# Patient Record
Sex: Female | Born: 1935 | Race: Black or African American | Hispanic: No | Smoking: Never smoker
Health system: Southern US, Community
[De-identification: ages and names within clinical notes are randomized; demographics above are authoritative.]

## PROBLEM LIST (undated history)

## (undated) DIAGNOSIS — Z515 Encounter for palliative care: Secondary | ICD-10-CM

## (undated) DIAGNOSIS — M109 Gout, unspecified: Secondary | ICD-10-CM

## (undated) DIAGNOSIS — I480 Paroxysmal atrial fibrillation: Secondary | ICD-10-CM

## (undated) DIAGNOSIS — I10 Essential (primary) hypertension: Secondary | ICD-10-CM

## (undated) DIAGNOSIS — I890 Lymphedema, not elsewhere classified: Secondary | ICD-10-CM

## (undated) DIAGNOSIS — R0602 Shortness of breath: Secondary | ICD-10-CM

## (undated) DIAGNOSIS — Z8679 Personal history of other diseases of the circulatory system: Secondary | ICD-10-CM

## (undated) DIAGNOSIS — G8929 Other chronic pain: Secondary | ICD-10-CM

## (undated) DIAGNOSIS — M549 Dorsalgia, unspecified: Secondary | ICD-10-CM

## (undated) DIAGNOSIS — N182 Chronic kidney disease, stage 2 (mild): Secondary | ICD-10-CM

## (undated) DIAGNOSIS — I2699 Other pulmonary embolism without acute cor pulmonale: Secondary | ICD-10-CM

## (undated) DIAGNOSIS — C549 Malignant neoplasm of corpus uteri, unspecified: Secondary | ICD-10-CM

## (undated) DIAGNOSIS — I959 Hypotension, unspecified: Secondary | ICD-10-CM

## (undated) DIAGNOSIS — I5031 Acute diastolic (congestive) heart failure: Secondary | ICD-10-CM

## (undated) DIAGNOSIS — N189 Chronic kidney disease, unspecified: Secondary | ICD-10-CM

## (undated) DIAGNOSIS — Z86010 Personal history of colonic polyps: Secondary | ICD-10-CM

## (undated) DIAGNOSIS — IMO0002 Reserved for concepts with insufficient information to code with codable children: Secondary | ICD-10-CM

## (undated) DIAGNOSIS — Z9889 Other specified postprocedural states: Secondary | ICD-10-CM

## (undated) DIAGNOSIS — K5790 Diverticulosis of intestine, part unspecified, without perforation or abscess without bleeding: Secondary | ICD-10-CM

## (undated) DIAGNOSIS — I219 Acute myocardial infarction, unspecified: Secondary | ICD-10-CM

## (undated) DIAGNOSIS — J309 Allergic rhinitis, unspecified: Secondary | ICD-10-CM

## (undated) DIAGNOSIS — N200 Calculus of kidney: Secondary | ICD-10-CM

## (undated) DIAGNOSIS — M1990 Unspecified osteoarthritis, unspecified site: Secondary | ICD-10-CM

## (undated) DIAGNOSIS — K219 Gastro-esophageal reflux disease without esophagitis: Secondary | ICD-10-CM

## (undated) DIAGNOSIS — C55 Malignant neoplasm of uterus, part unspecified: Secondary | ICD-10-CM

## (undated) DIAGNOSIS — I2510 Atherosclerotic heart disease of native coronary artery without angina pectoris: Secondary | ICD-10-CM

## (undated) DIAGNOSIS — I350 Nonrheumatic aortic (valve) stenosis: Secondary | ICD-10-CM

## (undated) DIAGNOSIS — I471 Supraventricular tachycardia: Secondary | ICD-10-CM

## (undated) DIAGNOSIS — Z95828 Presence of other vascular implants and grafts: Secondary | ICD-10-CM

## (undated) DIAGNOSIS — J189 Pneumonia, unspecified organism: Secondary | ICD-10-CM

## (undated) DIAGNOSIS — J9600 Acute respiratory failure, unspecified whether with hypoxia or hypercapnia: Secondary | ICD-10-CM

## (undated) DIAGNOSIS — I313 Pericardial effusion (noninflammatory): Secondary | ICD-10-CM

## (undated) DIAGNOSIS — J40 Bronchitis, not specified as acute or chronic: Secondary | ICD-10-CM

## (undated) HISTORY — PX: KNEE ARTHROSCOPY: SHX127

## (undated) HISTORY — DX: Presence of other vascular implants and grafts: Z95.828

## (undated) HISTORY — DX: Atherosclerotic heart disease of native coronary artery without angina pectoris: I25.10

## (undated) HISTORY — PX: CATARACT EXTRACTION, BILATERAL: SHX1313

## (undated) HISTORY — DX: Dorsalgia, unspecified: M54.9

## (undated) HISTORY — DX: Essential (primary) hypertension: I10

## (undated) HISTORY — DX: Diverticulosis of intestine, part unspecified, without perforation or abscess without bleeding: K57.90

## (undated) HISTORY — PX: OTHER SURGICAL HISTORY: SHX169

## (undated) HISTORY — DX: Morbid (severe) obesity due to excess calories: E66.01

## (undated) HISTORY — DX: Other pulmonary embolism without acute cor pulmonale: I26.99

## (undated) HISTORY — DX: Personal history of colonic polyps: Z86.010

## (undated) HISTORY — DX: Gout, unspecified: M10.9

## (undated) HISTORY — DX: Other chronic pain: G89.29

## (undated) HISTORY — DX: Allergic rhinitis, unspecified: J30.9

## (undated) HISTORY — DX: Reserved for concepts with insufficient information to code with codable children: IMO0002

## (undated) HISTORY — DX: Chronic kidney disease, unspecified: N18.9

## (undated) HISTORY — DX: Malignant neoplasm of uterus, part unspecified: C55

## (undated) HISTORY — DX: Lymphedema, not elsewhere classified: I89.0

## (undated) HISTORY — PX: COLONOSCOPY: SHX174

## (undated) HISTORY — DX: Gastro-esophageal reflux disease without esophagitis: K21.9

## (undated) HISTORY — DX: Malignant neoplasm of corpus uteri, unspecified: C54.9

## (undated) HISTORY — DX: Calculus of kidney: N20.0

## (undated) HISTORY — DX: Other specified postprocedural states: Z98.890

## (undated) HISTORY — PX: CARDIAC CATHETERIZATION: SHX172

## (undated) HISTORY — DX: Unspecified osteoarthritis, unspecified site: M19.90

## (undated) HISTORY — DX: Chronic kidney disease, stage 2 (mild): N18.2

## (undated) HISTORY — DX: Acute diastolic (congestive) heart failure: I50.31

## (undated) HISTORY — DX: Pneumonia, unspecified organism: J18.9

---

## 1971-10-16 HISTORY — PX: TUBAL LIGATION: SHX77

## 1987-10-16 DIAGNOSIS — I219 Acute myocardial infarction, unspecified: Secondary | ICD-10-CM

## 1987-10-16 HISTORY — DX: Acute myocardial infarction, unspecified: I21.9

## 1998-08-11 ENCOUNTER — Other Ambulatory Visit: Admission: RE | Admit: 1998-08-11 | Discharge: 1998-08-11 | Payer: Self-pay | Admitting: Endocrinology

## 1998-10-13 ENCOUNTER — Encounter: Payer: Self-pay | Admitting: Endocrinology

## 1998-10-13 ENCOUNTER — Ambulatory Visit (HOSPITAL_COMMUNITY): Admission: RE | Admit: 1998-10-13 | Discharge: 1998-10-13 | Payer: Self-pay | Admitting: Endocrinology

## 1998-11-21 ENCOUNTER — Ambulatory Visit (HOSPITAL_COMMUNITY): Admission: RE | Admit: 1998-11-21 | Discharge: 1998-11-21 | Payer: Self-pay | Admitting: *Deleted

## 1999-01-26 ENCOUNTER — Encounter: Payer: Self-pay | Admitting: General Surgery

## 1999-01-26 ENCOUNTER — Ambulatory Visit (HOSPITAL_COMMUNITY): Admission: RE | Admit: 1999-01-26 | Discharge: 1999-01-26 | Payer: Self-pay | Admitting: General Surgery

## 1999-05-26 ENCOUNTER — Ambulatory Visit (HOSPITAL_COMMUNITY): Admission: RE | Admit: 1999-05-26 | Discharge: 1999-05-26 | Payer: Self-pay | Admitting: General Surgery

## 1999-05-26 ENCOUNTER — Encounter: Payer: Self-pay | Admitting: General Surgery

## 1999-10-05 ENCOUNTER — Other Ambulatory Visit: Admission: RE | Admit: 1999-10-05 | Discharge: 1999-10-05 | Payer: Self-pay | Admitting: Endocrinology

## 2000-01-23 ENCOUNTER — Encounter: Payer: Self-pay | Admitting: Endocrinology

## 2000-01-23 ENCOUNTER — Ambulatory Visit (HOSPITAL_COMMUNITY): Admission: RE | Admit: 2000-01-23 | Discharge: 2000-01-23 | Payer: Self-pay | Admitting: Endocrinology

## 2000-07-17 ENCOUNTER — Other Ambulatory Visit: Admission: RE | Admit: 2000-07-17 | Discharge: 2000-07-17 | Payer: Self-pay | Admitting: Endocrinology

## 2001-01-30 ENCOUNTER — Encounter: Payer: Self-pay | Admitting: Endocrinology

## 2001-01-30 ENCOUNTER — Ambulatory Visit (HOSPITAL_COMMUNITY): Admission: RE | Admit: 2001-01-30 | Discharge: 2001-01-30 | Payer: Self-pay | Admitting: Endocrinology

## 2001-03-26 ENCOUNTER — Encounter (INDEPENDENT_AMBULATORY_CARE_PROVIDER_SITE_OTHER): Payer: Self-pay | Admitting: Specialist

## 2001-03-26 ENCOUNTER — Ambulatory Visit (HOSPITAL_COMMUNITY): Admission: RE | Admit: 2001-03-26 | Discharge: 2001-03-26 | Payer: Self-pay | Admitting: *Deleted

## 2001-07-31 ENCOUNTER — Other Ambulatory Visit: Admission: RE | Admit: 2001-07-31 | Discharge: 2001-07-31 | Payer: Self-pay | Admitting: Endocrinology

## 2002-02-02 ENCOUNTER — Encounter: Payer: Self-pay | Admitting: Endocrinology

## 2002-02-02 ENCOUNTER — Ambulatory Visit (HOSPITAL_COMMUNITY): Admission: RE | Admit: 2002-02-02 | Discharge: 2002-02-02 | Payer: Self-pay | Admitting: Endocrinology

## 2003-02-04 ENCOUNTER — Encounter: Payer: Self-pay | Admitting: Endocrinology

## 2003-02-04 ENCOUNTER — Encounter: Admission: RE | Admit: 2003-02-04 | Discharge: 2003-02-04 | Payer: Self-pay | Admitting: Endocrinology

## 2003-06-17 ENCOUNTER — Encounter: Payer: Self-pay | Admitting: *Deleted

## 2003-06-17 ENCOUNTER — Encounter: Admission: RE | Admit: 2003-06-17 | Discharge: 2003-06-17 | Payer: Self-pay | Admitting: *Deleted

## 2004-02-09 ENCOUNTER — Encounter: Admission: RE | Admit: 2004-02-09 | Discharge: 2004-02-09 | Payer: Self-pay | Admitting: Endocrinology

## 2005-02-13 ENCOUNTER — Encounter: Admission: RE | Admit: 2005-02-13 | Discharge: 2005-02-13 | Payer: Self-pay | Admitting: Endocrinology

## 2006-02-05 ENCOUNTER — Ambulatory Visit (HOSPITAL_COMMUNITY): Admission: RE | Admit: 2006-02-05 | Discharge: 2006-02-05 | Payer: Self-pay | Admitting: *Deleted

## 2006-02-19 ENCOUNTER — Encounter: Admission: RE | Admit: 2006-02-19 | Discharge: 2006-02-19 | Payer: Self-pay | Admitting: General Surgery

## 2007-03-12 ENCOUNTER — Encounter: Admission: RE | Admit: 2007-03-12 | Discharge: 2007-03-12 | Payer: Self-pay | Admitting: Internal Medicine

## 2007-07-01 ENCOUNTER — Encounter: Admission: RE | Admit: 2007-07-01 | Discharge: 2007-07-01 | Payer: Self-pay | Admitting: Internal Medicine

## 2008-03-15 ENCOUNTER — Encounter: Admission: RE | Admit: 2008-03-15 | Discharge: 2008-03-15 | Payer: Self-pay | Admitting: Internal Medicine

## 2008-03-30 ENCOUNTER — Ambulatory Visit (HOSPITAL_COMMUNITY): Admission: RE | Admit: 2008-03-30 | Discharge: 2008-03-30 | Payer: Self-pay | Admitting: *Deleted

## 2008-09-06 ENCOUNTER — Ambulatory Visit (HOSPITAL_COMMUNITY): Admission: RE | Admit: 2008-09-06 | Discharge: 2008-09-06 | Payer: Self-pay | Admitting: Obstetrics

## 2008-09-14 ENCOUNTER — Ambulatory Visit: Admission: RE | Admit: 2008-09-14 | Discharge: 2008-09-14 | Payer: Self-pay | Admitting: Gynecologic Oncology

## 2008-10-05 ENCOUNTER — Encounter: Payer: Self-pay | Admitting: Obstetrics & Gynecology

## 2008-10-05 ENCOUNTER — Inpatient Hospital Stay (HOSPITAL_COMMUNITY): Admission: RE | Admit: 2008-10-05 | Discharge: 2008-10-11 | Payer: Self-pay | Admitting: Obstetrics and Gynecology

## 2008-10-05 HISTORY — PX: ABDOMINAL HYSTERECTOMY: SHX81

## 2008-10-19 ENCOUNTER — Ambulatory Visit: Admission: RE | Admit: 2008-10-19 | Discharge: 2008-10-19 | Payer: Self-pay | Admitting: Gynecologic Oncology

## 2008-11-23 ENCOUNTER — Ambulatory Visit: Admission: RE | Admit: 2008-11-23 | Discharge: 2008-11-23 | Payer: Self-pay | Admitting: Gynecologic Oncology

## 2008-11-23 ENCOUNTER — Ambulatory Visit: Admission: RE | Admit: 2008-11-23 | Discharge: 2009-02-13 | Payer: Self-pay | Admitting: Radiation Oncology

## 2009-01-27 LAB — URINALYSIS, MICROSCOPIC - CHCC
Bilirubin (Urine): NEGATIVE
Glucose: NEGATIVE g/dL

## 2009-02-03 DIAGNOSIS — IMO0001 Reserved for inherently not codable concepts without codable children: Secondary | ICD-10-CM

## 2009-02-03 HISTORY — DX: Reserved for inherently not codable concepts without codable children: IMO0001

## 2009-04-04 ENCOUNTER — Encounter: Admission: RE | Admit: 2009-04-04 | Discharge: 2009-04-04 | Payer: Self-pay | Admitting: Internal Medicine

## 2009-06-15 ENCOUNTER — Ambulatory Visit: Admission: RE | Admit: 2009-06-15 | Discharge: 2009-06-15 | Payer: Self-pay | Admitting: Gynecologic Oncology

## 2009-06-15 ENCOUNTER — Other Ambulatory Visit: Admission: RE | Admit: 2009-06-15 | Discharge: 2009-06-15 | Payer: Self-pay | Admitting: Gynecologic Oncology

## 2009-06-15 ENCOUNTER — Encounter: Payer: Self-pay | Admitting: Gynecologic Oncology

## 2009-08-23 ENCOUNTER — Ambulatory Visit: Admission: RE | Admit: 2009-08-23 | Discharge: 2009-08-23 | Payer: Self-pay | Admitting: Radiation Oncology

## 2009-08-23 ENCOUNTER — Other Ambulatory Visit: Admission: RE | Admit: 2009-08-23 | Discharge: 2009-08-23 | Payer: Self-pay | Admitting: Radiation Oncology

## 2009-08-23 ENCOUNTER — Encounter: Payer: Self-pay | Admitting: Radiation Oncology

## 2009-11-16 ENCOUNTER — Other Ambulatory Visit: Admission: RE | Admit: 2009-11-16 | Discharge: 2009-11-16 | Payer: Self-pay | Admitting: Gynecologic Oncology

## 2009-11-16 ENCOUNTER — Ambulatory Visit: Admission: RE | Admit: 2009-11-16 | Discharge: 2009-11-16 | Payer: Self-pay | Admitting: Gynecologic Oncology

## 2010-02-21 ENCOUNTER — Other Ambulatory Visit: Admission: RE | Admit: 2010-02-21 | Discharge: 2010-02-21 | Payer: Self-pay | Admitting: Radiation Oncology

## 2010-02-21 ENCOUNTER — Ambulatory Visit: Admission: RE | Admit: 2010-02-21 | Discharge: 2010-02-21 | Payer: Self-pay | Admitting: Radiation Oncology

## 2010-03-28 ENCOUNTER — Ambulatory Visit: Admission: RE | Admit: 2010-03-28 | Discharge: 2010-03-28 | Payer: Self-pay | Admitting: Gynecologic Oncology

## 2010-06-01 ENCOUNTER — Other Ambulatory Visit: Admission: RE | Admit: 2010-06-01 | Discharge: 2010-06-01 | Payer: Self-pay | Admitting: Gynecologic Oncology

## 2010-06-01 ENCOUNTER — Ambulatory Visit: Admission: RE | Admit: 2010-06-01 | Discharge: 2010-06-01 | Payer: Self-pay | Admitting: Gynecologic Oncology

## 2010-08-07 ENCOUNTER — Encounter: Admission: RE | Admit: 2010-08-07 | Discharge: 2010-08-07 | Payer: Self-pay | Admitting: Internal Medicine

## 2010-08-31 ENCOUNTER — Ambulatory Visit: Admission: RE | Admit: 2010-08-31 | Discharge: 2010-08-31 | Payer: Self-pay | Admitting: Radiation Oncology

## 2010-08-31 ENCOUNTER — Other Ambulatory Visit: Admission: RE | Admit: 2010-08-31 | Discharge: 2010-08-31 | Payer: Self-pay | Admitting: Radiation Oncology

## 2010-12-13 ENCOUNTER — Ambulatory Visit: Payer: Medicare Other | Attending: Gynecologic Oncology | Admitting: Gynecologic Oncology

## 2010-12-13 ENCOUNTER — Other Ambulatory Visit (HOSPITAL_COMMUNITY)
Admission: RE | Admit: 2010-12-13 | Discharge: 2010-12-13 | Disposition: A | Discharge status: Home / Self Care | Payer: Medicare Other | Source: Ambulatory Visit | Attending: Gynecologic Oncology | Admitting: Gynecologic Oncology

## 2010-12-13 ENCOUNTER — Other Ambulatory Visit: Payer: Self-pay | Admitting: Gynecologic Oncology

## 2010-12-13 DIAGNOSIS — Z923 Personal history of irradiation: Secondary | ICD-10-CM | POA: Insufficient documentation

## 2010-12-13 DIAGNOSIS — C55 Malignant neoplasm of uterus, part unspecified: Secondary | ICD-10-CM | POA: Insufficient documentation

## 2010-12-13 DIAGNOSIS — Z90710 Acquired absence of both cervix and uterus: Secondary | ICD-10-CM | POA: Insufficient documentation

## 2010-12-13 DIAGNOSIS — Q820 Hereditary lymphedema: Secondary | ICD-10-CM | POA: Insufficient documentation

## 2010-12-13 DIAGNOSIS — Z8540 Personal history of malignant neoplasm of unspecified female genital organ: Secondary | ICD-10-CM | POA: Insufficient documentation

## 2011-01-02 ENCOUNTER — Other Ambulatory Visit: Payer: Self-pay | Admitting: Internal Medicine

## 2011-01-02 ENCOUNTER — Ambulatory Visit
Admission: RE | Admit: 2011-01-02 | Discharge: 2011-01-02 | Disposition: A | Discharge status: Home / Self Care | Payer: Medicare Other | Source: Ambulatory Visit | Attending: Internal Medicine | Admitting: Internal Medicine

## 2011-01-02 DIAGNOSIS — M79659 Pain in unspecified thigh: Secondary | ICD-10-CM

## 2011-01-02 DIAGNOSIS — M25559 Pain in unspecified hip: Secondary | ICD-10-CM

## 2011-01-05 NOTE — Consult Note (Signed)
NAMEMarland Kitchen  Christine [name], Christine [name] NO.:  [id number]  MEDICAL RECORD NO.:  [id number]           PATIENT TYPE:  LOCATION:                                 FACILITY:  PHYSICIAN:  Margy Sumler A. Duard Brady, MD    DATE OF BIRTH:  [birth year]  DATE OF CONSULTATION:  12/13/2010 DATE OF DISCHARGE:                                CONSULTATION   Christine [name] is a very pleasant 77 year old who underwent a TAH-BSO in December 2009 for endometrial carcinoma.  The patient's surgery was complicated and difficult due to her morbid obesity.  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 There was 0.1 out of 2.4 cm depth of stromal invasion with lymphovascular space involvement.  The adnexa were negative.  After lengthy discussions regarding chemotherapy and radiation, they opted for radiation.  She underwent postoperative external beam radiation and vaginal cuff brachytherapy under the care of Dr. Roselind Messier.  She was last seen by Dr. Roselind Messier in November 2011, at which time her exam was unremarkable.  She was seen by Dr. Nelly Rout in August 2011, at which time her exam was negative but the Pap smear revealed low-grade intraepithelial lesions.  There glandular cells without significant atypia and it was opted to follow her closely.  She is overall doing quite well and comes in today for followup.  REVIEW OF SYSTEMS:  She is complaining of some right gluteal/butt pain, which is new.  It has been going on for about a week.  It does radiate down her right leg and down the back of her knee.  She denies any injury or knowledge of pulling a muscle or any trauma.  This is a new pain for her.  She denies any vaginal bleeding; any change in bowel or bladder habits; any nausea, vomiting, fever, chills, headaches, visual changes. She has gained about 10 pounds since her last visit, but she states that she  believes some of this is fluid in her legs.  She has noticed some increase in her swelling from her congenital lymphedema.  Her daughter states that hers appears to be somewhat diet related and if she gave up sodas, the swelling goes away.  The patient states she may have to start doing that as it is really interfering with her quality of life.  She does get short of breath from walking.  She is able to walk from her car to church, but is short of breath and needs to rest, but she states when she rests for a few minutes, she is able to proceed on and it does not limit her quality of life.  She denies any chest pain, headaches, visual changes, shortness of breath, cough.  Medication list is reviewed and is unchanged.  PHYSICAL EXAMINATION:  VITAL SIGNS:  Weight 356 pounds and height 5 feet 7 inches for a BMI greater than 54.  Blood pressure 138/70, pulse 84, respirations 20, temperature 97.9. GENERAL:  A well-nourished, well-developed female, in no acute distress. NECK:  Supple.  There is no lymphadenopathy, no thyromegaly. LUNGS:  Clear to auscultation bilaterally. CARDIOVASCULAR:  Regular rate and rhythm with a 2/6 systolic ejection murmur. ABDOMEN:  Morbidly obese.  She has a well-healed vertical midline incision.  She has changes consistent with candida under the pannus. Groins are negative for adenopathy. EXTREMITIES:  There is massive edema, equal bilaterally, consistent with congenital lymphedema.  There are scales and chronic changes of her legs. PELVIC:  External genitalia is within normal limits.  The vagina is markedly atrophic and foreshortened.  There are no gross visible lesions.  A ThinPrep Pap was done without difficulty.  Bimanual examination is limited by the patient's habitus, but there are no obvious masses.  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  She is doing well from a post-treatment standpoint, being 2  years out from her diagnosis.  She has no evidence of recurrent disease.  PLAN: 1. We will follow up on the results of her Pap smear from today.  She     will follow up with Dr. Roselind Messier in 3 months and return to see [place] in     6 months. 2. She was encouraged to take ibuprofen, use some moist heat or     Tylenol for her back pain.  If this does not improve, she will     discuss it with her primary care physician.     Penelope Fittro A. Duard Brady, MD     PAG/MEDQ  D:  12/13/2010  T:  12/14/2010  Job:  [id number]  cc:   Merlene Laughter. Renae Gloss, M.D. Fax: [phone]  Billie Lade, M.D. Fax: [phone]  Lendon Colonel, MD Fax: [phone]  Telford Nab, R.N. [phone] [address] [place], [place] [address]  Electronically Signed by Cleda Mccreedy MD on 12/14/2010 02:11:30 PM

## 2011-02-27 NOTE — Op Note (Signed)
NAMEMarland [name]  [name], SAND NO.:  [id number]   MEDICAL RECORD NO.:  [id number]          PATIENT TYPE:  INP   LOCATION:  1539                         FACILITY:  [facility]   PHYSICIAN:  Paola A. Duard Brady, MD    DATE OF BIRTH:  11-21-1930   DATE OF PROCEDURE:  10/05/2008  DATE OF DISCHARGE:                               OPERATIVE REPORT   PREOPERATIVE DIAGNOSES:  Endometrial carcinoma, possible carcinosarcoma,  morbid obesity.   POSTOPERATIVE DIAGNOSES:  Endometrial carcinoma, possible  carcinosarcoma, morbid obesity.   PROCEDURE:  Total abdominal hysterectomy, bilateral salpingo-  oophorectomy, bilateral pelvic lymph node sampling, wound VAC placement.   SURGEONS:  Paola A. Duard Brady, MD and Roseanna Rainbow, M.D.   ASSISTANT:  Telford Nab, R.N.   ANESTHESIA:  General.   ANESTHESIOLOGIST:  Jenelle Mages. Fortune, M.D.   ESTIMATED BLOOD LOSS:  200 mL.   IV FLUIDS:  2000 mL.   URINE OUTPUT:  100 mL.   COMPLICATIONS:  None.   SPECIMENS:  Uterus, cervix, bilateral tubes and ovaries, abdominal  ascites, bilateral pelvic lymph nodes.   OPERATIVE FINDINGS:  Included about 500 mL of clear yellow ascites upon  entry to the abdomen.  The corpus itself was of normal size and shape.  There was no obvious extrauterine disease.  There was significant  intraabdominal fat.  There was no palpably enlarged lymph nodes.  All  peritoneal surfaces were smooth.   The patient was taken to the operating room, placed in the supine  position where general anesthesia was induced.  SCDs could not be placed  therefore, foot pumps were placed on the patient.  She was then placed  in dorsal lithotomy position with all appropriate precautions.  The  abdomen was prepped in the usual sterile fashion.  Foley catheter was  inserted in the bladder under sterile conditions.  The patient was then  draped in the usual fashion.  A time-out was performed to confirm the  patient, antibiotics,  allergies, procedure and surgeons.  A vertical  supra/infraumbilical incision was made with a knife and carried down to  the overlying fascia using Bovie cautery.  The fascia was scored in the  midline and extended superiorly and inferiorly.  The rectus bellies were  dissected off the overlying fascia.  The peritoneal cavity was entered  and the peritoneal incision was extended superiorly and inferiorly with  visualization of the underlying peritoneal cavity.  At this time the 500  mL of ascites were removed..  The Bookwalter self-retaining retractor  was then placed on the bed.  The large blades were placed.  The small  bowel was packed out of the all the way with moist laparotomy sponges  using appropriate precautions.  A bladder blade was placed to hold the  subcu fat.  Our attention was first turned to the patient's right  sidewall.  The round ligament was transected with monopolar cautery and  the anterior and posterior leaves of broad ligament were opened.  The  ureter was identified.  A window was made between the IP and the ureter.  The IP was  clamped, transected and suture ligated.  We then skeletonized  the uterine vessels on the patient's right side and created a bladder  flap to the level below the uterine vessels.  Using curved Mastersons  the uterine vessels were clamped.  They were then transected and suture  ligated x2.  A similar procedure was performed on the patient's left  side.  We then proceeded with the bladder flap, dropping it down to a  level below the cervical vaginal junction.  We continued down the  cardinal ligaments with curved Masterson clamps.  Each pedicle was  created, transected and suture ligated with zero Vicryl.  We then came  across the cervicovaginal junction with curved Mastersons and met in the  middle.  The cervix was amputated from the vagina.  Figure-of-eight  sutures of zero Vicryl were placed for the angles.  The remainder of the  vaginal  cuff was closed using zero Vicryl.  The pedicles were noted to  be hemostatic.   Our attention was first drawn to the patient's right sidewall.  With  palpation we were able to identify the external iliac artery.  The nodal  bundle extending over the external iliac artery to the vein was removed.  We did not perform an obturator dissection.  This bundle was removed  using sharp dissection and pinpoint cautery.  A similar dissection was  performed on the patient's right side.  The pedicle sites were all noted  to be hemostatic.  The abdomen and pelvis were copiously irrigated.  Again, all pedicles were inspected and noted to be hemostatic.  The  retractor blades were removed.  All laparotomy sponges were removed.  The count was correct x2.  The fascia was then closed using a running #1  PDS mass closure.  The subcu tissues were irrigated.  Staples were then  placed due to the patient's significant intraabdominal and subcu fat.  The wound VAC was then placed.  The wound VAC sponge was cut to the  length of the patient's incision.  The wound VAC was then applied in the  usual fashion with the two layers of Saran covering.  The wound VAC was  applied with 125 mmHg pressure.  There was a good seal.   The patient was taken to the recovery room in stable condition.  All  instrument, needle and laparotomy counts were correct x2.      Paola A. Duard Brady, MD  Electronically Signed     PAG/MEDQ  D:  10/05/2008  T:  10/05/2008  Job:  [id number]   cc:   Roseanna Rainbow, M.D.  Fax: [phone]   Lendon Colonel, MD  Fax: [phone]   Nanetta Batty, M.D.  Fax: [phone]   Merlene Laughter. Renae Gloss, M.D.  Fax: [phone]   Telford Nab, R.N.  [address]  [place], [place] [address]

## 2011-02-27 NOTE — Consult Note (Signed)
NAMEMarland Kitchen  JALEEYA, Christine [name] NO.:  [id number]   MEDICAL RECORD NO.:  [id number]          PATIENT TYPE:  OUT   LOCATION:  GYN                          FACILITY:  [facility]   PHYSICIAN:  Paola A. Duard Brady, MD    DATE OF BIRTH:  [birth year]   DATE OF CONSULTATION:  10/19/2008  DATE OF DISCHARGE:                                 CONSULTATION   Christine [name] is a very pleasant 77 year old with high-grade endometrial  adenocarcinoma.  She was seen by Dr. Ronita Hipps September 14, 2008.  She  did have a CT scan of the abdomen and pelvis preoperatively that  revealed no evidence of metastatic disease, no adenopathy, no ascites,  or carcinomatosis.  She subsequently underwent TAH/BSO in an attempt at  pelvic lymph node dissection on October 05, 2008.  Surgery was very  complicated and difficult due to the patient's morbid obesity.  We did  attempt to perform nodal excision, however, it was very limited due to  the patient's habitus.  She did fairly well postoperatively, had a wound  VAC placed over the staple line to try to prevent postoperative wound  infection, seroma, and breakdown and comes in today for staple removal.  She is overall doing fairly well, has had some issues with constipation  but is otherwise fine.  Pathology washings were negative within the  uterus.  She had superficial, invasive, high-grade carcinosarcoma  confined within the inner half of the myometrium but involving the  endocervical stroma.  There was 0.1 cm out of 2.4 cm.  There was  cervical stromal involvement.  There was angiolymphatic invasion.  The  adnexa were negative.  We were not able to obtain any lymphoid tissue.  She only had benign fibroadenoma tissue.  From a postoperative  standpoint as stated above, she is doing fairly well and she comes in  for staple removal.  Abdomen, she has a vertical midline incision.  Staples were removed.  There is some maceration of the skin around the  umbilicus.  All  staples removed superior and inferior to the umbilicus.  Steri-Strips were placed.  A moist gauze was placed in the umbilicus  with a dry gauze on top.  A 77 year old at technically unstage though  clinical stage 2 carcinosarcoma of the uterus.  I had a discussion with  the patient, 2 of her daughters, and her son.  There is some debate in  literature whether or not patients with early stage carcinosarcoma  benefit from chemotherapy versus radiation.  There is a GOG protocol  open evaluating this that patients need to be completely staged which  due to her medical comorbidities she was not able to be completely  surgically staged.  I believe due to her comorbidities it would be very  difficult for her to proceed with chemotherapy and her family is very  much in agreement with this.  They would not accept chemotherapy but  would be interested in proceeding with radiation therapy.  I discussed  with them briefly what the radiation therapy would involve.  Their  questions were elicited.  At  this point, they are not ready to make a  definitive decision.  I discussed  with them that it is too soon to start anyway and for them to take a  couple weeks to think about the radiation versus close followup and to  let us know how they would like to proceed.  They have my card, as well  as that of Telford Nab and will contact us with their decision.  We  will schedule her for a postoperative appointment.      Paola A. Duard Brady, MD  Electronically Signed     PAG/MEDQ  D:  10/19/2008  T:  10/19/2008  Job:  [id number]   cc:   Lendon Colonel, MD  Fax: [phone]   Nanetta Batty, M.D.  Fax: [phone]   Merlene Laughter. Renae Gloss, M.D.  Fax: [phone]   Telford Nab, R.N.  [address]  [place], [place] [address]

## 2011-02-27 NOTE — Consult Note (Signed)
NAMEMarland [name]  QUINCY, PRISCO NO.:  [id number]   MEDICAL RECORD NO.:  [id number]          PATIENT TYPE:  OUT   LOCATION:  GYN                          [facility]ACILITY[facility]   PHYSICIAN:  Paola A. Duard Brady, MD    DATE OF BIRTH:  05/21/1931   DATE OF CONSULTATION:  11/23/2008  DATE OF DISCHARGE:                                 CONSULTATION   Ms. Dormer is a 77 year old morbidly obese female with multiple medical  comorbidities with a high-grade endometrial carcinoma.  She was seen by  Dr. Kyla Balzarine in December of 2009.  CT scan of the abdomen and pelvis that  was performed preoperatively revealed no evidence of metastatic disease.  She subsequently underwent a TAH, BSO, and attempt at pelvic lymph node  dissection, October 05, 2008.  Surgery was complicated and difficult  due to the patient's morbid obesity.  We did attempt to perform a nodal  dissection, however, it was very limited to her habitus and final  pathology revealed no evidence of nodal tissue.  However, pathology of  washings were negative.  She had a superficial, invasive, high-grade  carcinosarcoma confined to the inner half of the myometrium but  involving the endocervical stroma.  There was 0.1 cm out of 2.4 cm of  depth of invasion.  There was cervical stromal involvement with LVSI.  The adnexa were negative.  I had a lengthy discussion with the patient,  her 2 daughters, and son on October 19, 2008.  At that time, all agreed  that she would not be a good candidate for postoperative chemotherapy  and they decided to consider the possibility of proceeding with  radiation therapy.  They were undecided and unable to make a decision at  that time so she comes in today for her postoperative check, as well as  to make a decision regarding adjuvant therapy.  The patient and her  daughter state that they were ready to make a decision regarding  radiation at that time and that they would like to do it.  Her son was  trying to  make decisions for her and he was the one that in their  opinion was more vocal regarding not pursuing it.  She currently has an  appointment with Dr. Roselind Messier tomorrow for discussion of radiation  oncology and they would very much like to keep that.   She is overall doing quite well.  She is currently home and has been  home for the past 3 weeks from the nursing facility where she was.  She  has lost approximately 30 pounds since we initially saw her September 14, 2008.  They relate that to the quality of the food at the nursing home  and that she was not eating well.  Now that she is back at home she is  eating more regularly and they feel that the weight loss has changed.  She does complain of some occasional gagging with no emesis.  She is  having regular bowel movements, is tolerating a regular diet.  Denies  any abdominal pain, vaginal bleeding.  She  states she is overall doing  quite well and she and her daughters are very pleased with her current  progress.   PAST MEDICAL HISTORY:  1. Hypertension.  2. Obesity.  3. Diet-controlled diabetes.  4. GERD.  5. Arthritis.  6. Chronic familial lymphedema.   MEDICATIONS:  1. Lotrel.  2. Reglan.  3. Sulindac.  4. Clonidine.  5. Lopressor.  6. Zetia.  7. Diovan.   ALLERGIES:  NONE.   PHYSICAL EXAMINATION:  Weight 324 pounds which is down from 356 pounds,  September 14, 2008.  Well-nourished, well-developed female in no acute  distress.  ABDOMEN:  Incision is evaluated, it is well healed.  There are some  postoperative changes along the incisional tissue.  Abdomen is otherwise  soft and nontender.  There are no masses or hernias appreciated but exam  is significant limited by habitus.  PELVIC:  External genitalia is within normal limits.  The vagina is  slightly atrophic.  The vaginal cuff is visualized with some difficulty  due to the patient's habitus but appears to be well healed.  Bimanual  examination reveals no masses or  nodularity but again exam is limited.   ASSESSMENT:  95. A 77 year old with technically end-stage but at least a stage II      carcinosarcoma of the endometrium who is not deemed to be an      adequate candidate for postoperative chemotherapy.  She has been      dispositioned to postoperative radiation therapy.  I would favor if      at all possible to proceed with pelvic and HTR brachytherapy but I      will need to defer to Dr. Trina Ao opinion.  She has an appointment      with him tomorrow.  After she completes her radiation therapy, we      will begin alternating visits between himself and our office.  2. She did have an episode of the gagging while she was here.  There      was no emesis associated with that.  She does have a history of      gastroesophageal reflux disease and we will recommend that she      supplement with some Tums.      Paola A. Duard Brady, MD  Electronically Signed     PAG/MEDQ  D:  11/23/2008  T:  11/23/2008  Job:  [id number]   cc:   Telford Nab, R.N.  [address]  [place], [place] [address]   Billie Lade, M.D.  Fax: [phone]   Lendon Colonel, MD  Fax: [phone]   Nanetta Batty, M.D.  Fax: [phone]   Merlene Laughter. Renae Gloss, M.D.  Fax: [phone]

## 2011-02-27 NOTE — Discharge Summary (Signed)
NAMEMarland Kitchen  Christine [name], Christine [name] NO.:  [id number]   MEDICAL RECORD NO.:  [id number]          PATIENT TYPE:  INP   LOCATION:  1539                         [facility]ACILITY[facility]   PHYSICIAN:  Roseanna Rainbow, M.D.DATE OF BIRTH:  October 23, 1930   DATE OF ADMISSION:  10/05/2008  DATE OF DISCHARGE:  10/11/2008                               DISCHARGE SUMMARY   CHIEF COMPLAINT:  The patient is a 77 year old with a newly diagnosed  high-grade endometrial adenocarcinoma who presents for operative  management.  Please see the dictated history and physical as per Dr.  Sabino Donovan for further details.   HOSPITAL COURSE:  The patient was admitted and underwent a total  abdominal hysterectomy, bilateral salpingo-oophorectomy, bilateral  pelvic lymph node sampling and wound vac placement.  Please see the  dictated operative summary.  Postoperatively, the patient did well.  Her  diet was gradually advanced.  She remained euglycemic.  Per the family's  request, plans were set in place for transfer to an occupational therapy  facility at the time of discharge.  The wound vac had minimal drainage.  The incision was healing well.   DISCHARGE DIAGNOSIS:  Endometrial carcinoma.   PROCEDURES:  Total abdominal hysterectomy bilateral salpingo-  oophorectomy, bilateral pelvic lymph node sampling and wound vac  placement.   CONDITION:  Stable.   DISCHARGE INSTRUCTIONS:  Diet.  Continue the ADA diet.   MEDICATIONS:  Resume the preoperative medications.  Would continue  prophylactic heparin at 5000 units subcu t.i.d. for approximately a week  to 10 days postop.   DISPOSITION:  The patient is to be transferred to an occupational  therapy facility.  She is also to follow up in the GYN Oncology office.  Continue the wound vac for approximately 1 week.  Remove staples on  postoperative day #7.      Roseanna Rainbow, M.D.  Electronically Signed     LAJ/MEDQ  D:  10/11/2008  T:   10/11/2008  Job:  [id number]   cc:   Nanetta Batty, M.D.  Fax: [phone]   Lendon Colonel, MD  Fax: [phone]   Merlene Laughter. Renae Gloss, M.D.  Fax: [phone]   Telford Nab, R.N.  [address]  [place], [place] [address]

## 2011-02-27 NOTE — Consult Note (Signed)
NAMEMarland [name]  SHANNA, UN NO.:  [id number]   MEDICAL RECORD NO.:  [id number]          PATIENT TYPE:  OUT   LOCATION:  GYN                          FACILITY:  [facility]   PHYSICIAN:  Paola A. Duard Brady, MD    DATE OF BIRTH:  [birth year]   DATE OF CONSULTATION:  06/15/2009  DATE OF DISCHARGE:                                 CONSULTATION   HISTORY OF PRESENT ILLNESS:  Christine [name] is a very pleasant 77 year old  who was diagnosed with an endometrial carcinoma.  She subsequently  underwent exploratory laparotomy TAH-BSO and attempt at lymph node  dissection October 05, 2008.  Surgery was complicated and difficult due  to the patient's morbid obesity.  Final pathology revealed a superficial  invasive high-grade carcinosarcoma confined to the inner half of the  myometrium but involving the endocervical stroma.  There was 0.1 out of  2.4 cm depth of invasion and stromal invasion with lymphovascular space  involvement.  The adnexa were negative.  After lengthy discussions  regarding chemotherapy versus radiation therapy we opted for radiation  therapy.  She underwent postoperative external beam radiation with  vaginal cuff brachytherapy under the care of Dr. Roselind Messier.  She saw Dr.  Roselind Messier for 6 weeks post radiation visit March 15, 2009, and comes in today  for follow-up.  She is overall doing quite well.  She comes accompanied  by two of her daughters.  We did spend a significant amount of time  talking about weight loss strategies, food preferences.  The patient  does seem to have a fairly high fat diet.  She eats biscuits in the  morning.  She has a desert every night, drinks at least two high calorie  sodas during the day.  She is very sedentary due to her weight and  mobility limitations.  We spent a significant amount of time talking  about drinking water over non-diet sodas, but if she feels that she  needs a soda, it would be better to drink a diet soda.  We also talked  about food  portions in that each portion of food she eats should be  approximately the size of the palm of her hand.  It is clear that the  patient has a very high fat diet.  She would be interested in speaking  with a nutritionist here at the [facility], but will try to modify  some of these food choices.  Her daughters who are here with her today  are very supportive of this.  She has overall done fairly well.  She  states that she has had no issues and had no issues with the radiation  therapy and tolerated it quite well.   REVIEW OF SYSTEMS:  She denies any cramping.  She denies any bleeding,  any nausea, vomiting, fevers, chills, headaches, visual changes, change  in bowel or bladder habits.  She is very sedentary.  Denies any pain.  She does have shortness of breath when she walks around with her walker.  She comes in today in a wheelchair.  They are having issues keeping her  toenails clipped and she has not been back to see podiatrist.   PHYSICAL EXAMINATION:  VITAL SIGNS:  Weight exceeded 350 pounds, could  not be weighed on our scale.  Her last weight in February was 324  pounds.  Blood pressure 134/72.  GENERAL:  Well-nourished, well-developed pleasant female with very poor  dentition in no acute distress.  NECK:  Supple with no lymphadenopathy, no thyromegaly.  LUNGS:  Clear to auscultation bilaterally.  CARDIOVASCULAR:  Irregular rate and rhythm.  ABDOMEN:  Morbidly obese.  She has a well-healed surgical incision.  There is no evidence of any incisional hernias.  Abdomen is soft,  nontender.  I Cannot appreciate any masses or hepatosplenomegaly.  Her  exam is limited by habitus.  She has clean intertriginous zones with no  evidence of any candida.  She does have chronic skin changes underneath  her pannus.  Groins are negative for adenopathy.  EXTREMITIES:  She has massive familial lymphedema with chronic skin  changes.  She has a very long second toe nail that is curving and   potentially adding pressure to the big toe on her left foot.  She has a  well-healing sore on the dorsum of her right foot.  PELVIC:  External genitalia is within normal limits.  Vagina is  atrophic.  The vaginal cuff is visualized.  No visible lesions.  ThinPrep Pap smear without difficulty.  Bimanual examination is limited  by habitus.  However, the top of the vagina is palpably normal.  There  is no rectovaginal nodularity but exam is severely limited by habitus.   ASSESSMENT:  A 77 year old with stage II carcinosarcoma who clinically  has no evidence of recurrent disease.   PLAN:  1. Will follow up on results of her Pap smear from today.  She did      have a CT scan of the chest, abdomen and pelvis in November 2009.      When she returns to see [place] in January 2011, will schedule her for      repeat imaging.  She has an appointment with Dr. Roselind Messier in 2-3      months and will return to see [place] in 3 months after that.  2. She was given the phone number for [facility] to call to make an appointment with them should she      desire.  We had set a 20 pounds weight loss goal for her next visit      with [place].      Paola A. Duard Brady, MD  Electronically Signed     PAG/MEDQ  D:  06/15/2009  T:  06/15/2009  Job:  [id number]   cc:   Telford Nab, R.N.  [address]  [place], [place] [address]   Merlene Laughter. Renae Gloss, M.D.  Fax: [phone]   Lendon Colonel, MD  Fax: [phone]   Billie Lade, M.D.  Fax: [phone]

## 2011-02-27 NOTE — Consult Note (Signed)
NAMEMarland Kitchen  Christine, [name] NO.:  [id number]   MEDICAL RECORD NO.:  [id number]          PATIENT TYPE:  OUT   LOCATION:                               FACILITY:  [facility]   PHYSICIAN:  John T. Kyla Balzarine, M.D.    DATE OF BIRTH:  02-19-1931   DATE OF CONSULTATION:  DATE OF DISCHARGE:                                 CONSULTATION   CHIEF COMPLAINT:  This 77 year old woman is seen at the request of Dr.  Ernestina Penna for high-grade endometrial adenocarcinoma.   HISTORY OF PRESENT ILLNESS:  This patient's past history is marked by  morbid obesity.  She relates an episode of bleeding in the spring of  2009 that was mistaken for rectal bleeding.  Colonoscopy was apparently  negative.  She has not had pelvic examination or cytology for more than  5 years.  In October 2009, she had another episode of bleeding, heavier  this time and obviously vaginal bleeding.  She had pelvic cramps during  bleeding.  Evaluation by Dr. Ernestina Penna included endometrial biopsy on  November 13, and this revealed high-grade carcinoma with questionable  focus of carcinosarcoma.  CT of the abdomen and pelvis with contrast  obtained November 23 revealed essentially no evidence of metastatic  disease, no lymphadenopathy, ascites or carcinomatosis.  It should be  noted that the patient had multiple cysts in the kidneys, particularly  the right upper pole and surgical clips on the upper pole of the left  kidney.  The uterus was described as normal and no pelvic  lymphadenopathy was noted.   PAST MEDICAL HISTORY:  Significant for hypertension, obesity and diet-  controlled diabetes.  Chronic GERD, arthritis, chronic familial  lymphedema.  She is post open resection of nephrolithiasis in left  kidney, BTL, right knee arthroscopic surgery and left open surgery.  She  states that she had a light MI in 1989 and completely recovered from  this.   CURRENT MEDICATIONS:  Lotrel, Reglan, sulindac, clonidine, Lopressor,  Zetia,  and Diovan.   ALLERGIES:  None known.   FAMILY HISTORY:  Significant for colon cancer in her sister and 2 with  breast cancer; 1 sister with cervical cancer.  Other cancers are present  in maternal family members.   PERSONAL AND SOCIAL HISTORY:  Widowed, denies tobacco or ethanol use.  Extremely sedentary because of arthritis.   REVIEW OF SYSTEMS:  GENERAL:  Significant for bilateral chronic knee  pain and difficulty ambulating.  She is able to take short flights of  stairs but, because of her knees, will not climb a standard flight of  stairs.  No chest pain, dyspnea on exertion or orthopnea.  Otherwise  essentially negative 10-point review.   EXAM:  VITAL SIGNS:  Weight 356 pounds, blood pressure 198/80, pulse  106.  CONSTITUTIONAL:  The patient is anxious, alert and oriented x3 in no  acute distress.  ENT:  Benign with clear oropharynx with poor dentition.  No scleral  icterus and full extraocular movements present.  NECK:  Supple without goiter.  LUNGS:  Clear.  HEART:  Regular rate and rhythm  with no JVD.  LYMPHATICS:  No pathologic lymphadenopathy.  ABDOMEN:  Obese, soft and benign with no hernia, ascites, mass or  organomegaly.  No abdominal tenderness palpable.  There is a depressed  midline scar with no hernia or tenderness palpable.  EXTREMITIES:  Full strength and range of motion with limited knee  flexion.  The patient does note that she cramps frequently.  There is  chronic bipedal edema with stasis changes, 2+.  Negative Homans and  cords.  PELVIC:  External genitalia and BUS, bladder and urethra, and vagina are  all clear with normal vaginal mucosa.  Speculum examination reveals  small cervix without lesions and no cervical motion tenderness.  Bimanual and rectovaginal examinations are limited by habitus, with no  apparent uterine or lower uterine segment enlargement, adnexal mass or  cul-de-sac nodularity.   ASSESSMENT:  Grade III endometrial adenocarcinoma,  rule out  carcinosarcoma.  Morbid obesity.  On known cardiac history.   PLAN AND RECOMMENDATIONS:  I would recommend exploratory laparotomy for  TAH-BSO.  The extent of further staging would be determined by the  patient's cardiac function, circumstances during surgery, and the extent  of adhesions both intra-abdominally and in the retroperitoneum from her  prior open nephrolithotomy.  The patient is not a good candidate for  laparoscopic surgery because of her morbid obesity.  She understands  this recommendation and we will set up cardiac clearance with her  cardiologist.  Hemoglobin A1c would be advisable so we can determine  whether glucose coverage with insulin would be needed intraoperatively.  I discussed the possibility for adjuvant treatment after surgery  including chemotherapy, radiation or a combination of these.  I answered  multiple questions posed by the patient and her family.  Surgery will be  arranged for December 22nd, after she has undergone cardiac clearance.      John T. Kyla Balzarine, M.D.  Electronically Signed     JTS/MEDQ  D:  09/14/2008  T:  09/15/2008  Job:  [id number]   cc:   Lendon Colonel, MD  Fax: [phone]   Nanetta Batty, M.D.  Fax: [phone]   Merlene Laughter. Renae Gloss, M.D.  Fax: [phone]   Telford Nab, R.N.  [address]  [place], [place] [address]

## 2011-02-27 NOTE — Op Note (Signed)
NAMEMarland Kitchen  JOLEY, UTECHT NO.:  [id number]   MEDICAL RECORD NO.:  [id number]          PATIENT TYPE:  AMB   LOCATION:  ENDO                         FACILITY:  [facility]   PHYSICIAN:  Georgiana Spinner, M.D.    DATE OF BIRTH:  October 17, 1930   DATE OF PROCEDURE:  03/30/2008  DATE OF DISCHARGE:                               OPERATIVE REPORT   PROCEDURE:  Colonoscopy.   INDICATIONS:  Rectal bleeding.   ANESTHESIA:  1. Fentanyl 100 mcg.  2. Versed 10 mg.   PROCEDURE:  With the patient mildly sedated in the left lateral  decubitus position, the Pentax videoscopic colonoscope was inserted in  the rectum and passed under direct vision to the cecum, identified by  ileocecal valve and appendiceal orifice, both of which were  photographed.  From this point, the colonoscope was slowly withdrawn,  taking circumferential views of the colonic mucosa, stopping to clear  some of thickish mustard-yellow stool from various spots, the patient  did not take the full prep down, but no gross lesions were seen as we  withdrew all the way to the rectum, which appeared normal on direct and  showed hemorrhoids on retroflexed view.  The endoscope was straightened  and withdrawn.  The patient's vital signs and pulse oximeter remained  stable.  The patient tolerated the procedure well, without apparent  complications.   FINDINGS:  Some diverticula seen in the sigmoid colon.  Internal  hemorrhoids.  Otherwise, an unremarkable exam presumably. Bleeding was  from the internal hemorrhoids.   PLAN:  Will have patient follow up with me on as-needed basis.           ______________________________  Georgiana Spinner, M.D.     GMO/MEDQ  D:  03/30/2008  T:  03/30/2008  Job:  [id number]   cc:   Merlene Laughter. Renae Gloss, M.D.  Fax: [phone]

## 2011-03-02 NOTE — Op Note (Signed)
NAMEMarland Kitchen  Christine, [name] NO.:  [id number]   MEDICAL RECORD NO.:  [id number]          PATIENT TYPE:  AMB   LOCATION:  ENDO                         FACILITY:  [facility]   PHYSICIAN:  Georgiana Spinner, M.D.    DATE OF BIRTH:  [birth year]   DATE OF PROCEDURE:  02/05/2006  DATE OF DISCHARGE:                                 OPERATIVE REPORT   PROCEDURE:  Colonoscopy.   ENDOSCOPIST:  Georgiana Spinner, M.D.   INDICATIONS:  Colon polyps.   ANESTHESIA:  Demerol 80 mg, Versed 8 mg.   PROCEDURE:  With the patient mildly sedated in the left lateral decubitus  position, the Olympus videoscopic colonoscope CF-160AL was inserted in the  rectum and passed under direct vision with pressure applied to the abdomen  as best we could.  We reached the cecum, identified by base of cecum and  ileocecal valve, both of which were photographed.  From this point, the  colonoscope was slowly withdrawn, taking circumferential views of colonic  mucosa, stopping only then in the rectum, which appeared normal on direct  and retroflexed view.  The endoscope was straightened and withdrawn.  The  patient's vital signs and pulse oximetry remained stable.  The patient  tolerated procedure well and without apparent complications.   FINDINGS:  Unremarkable examination, other than some diverticulosis of  sigmoid colon.   PLAN:  Consider repeat examination possibly in 5 years           ______________________________  Georgiana Spinner, M.D.     GMO/MEDQ  D:  02/05/2006  T:  02/06/2006  Job:  [id number]

## 2011-03-02 NOTE — Procedures (Signed)
[facility]  Patient:    Christine [name], Christine [name]                         MRN: [id number] Proc. Date: 03/26/01 Attending:  Sabino Gasser, M.D.                           Procedure Report  PROCEDURE:  Colonoscopy.  INDICATIONS:  Colon polyps.  ANESTHESIA:  Demerol 75 mg, Versed 8 mg.  DESCRIPTION OF PROCEDURE:  With the patient mildly sedated in the left lateral decubitus position, the Olympus videoscopic variable stiffness colonoscope was inserted into the rectum and passed under direct vision to the cecum, identified by the ileocecal valve and appendiceal orifice.  From this point, the colonoscope was slowly withdrawn taking circumferential views of the entire colonic mucosa, stopping at approximately the splenic flexure area at which point a small polyp was seen, photographed, and removed using first snare cautery technique and then hot biopsy forceps technique.  The colonoscope was then withdrawn, taking circumferential views of the remaining colonic mucosa, stopping to photograph diverticula seen in the sigmoid colon until we reached the rectum which appeared normal on direct view and showed internal hemorrhoids on retroflexed view.  The endoscope was straightened and withdrawn.  The patients vital signs and pulse oximeter remained stable.  The patient tolerated the procedure well without apparent complications.  FINDINGS: 1. One polyp at splenic flexure area 2. Diverticula of sigmoid colon 3. Internal hemorrhoids.  PLAN:  The patient will call me for results and follow up with me as an outpatient. DD:  03/26/01 TD:  03/26/01 Job: [id number]

## 2011-03-12 ENCOUNTER — Emergency Department (HOSPITAL_COMMUNITY)
Admission: EM | Admit: 2011-03-12 | Discharge: 2011-03-12 | Disposition: A | Discharge status: Home / Self Care | Payer: Medicare Other | Attending: Emergency Medicine | Admitting: Emergency Medicine

## 2011-03-12 DIAGNOSIS — E119 Type 2 diabetes mellitus without complications: Secondary | ICD-10-CM | POA: Insufficient documentation

## 2011-03-12 DIAGNOSIS — M545 Low back pain, unspecified: Secondary | ICD-10-CM | POA: Insufficient documentation

## 2011-03-12 DIAGNOSIS — M129 Arthropathy, unspecified: Secondary | ICD-10-CM | POA: Insufficient documentation

## 2011-03-12 DIAGNOSIS — I10 Essential (primary) hypertension: Secondary | ICD-10-CM | POA: Insufficient documentation

## 2011-03-12 DIAGNOSIS — I252 Old myocardial infarction: Secondary | ICD-10-CM | POA: Insufficient documentation

## 2011-03-12 DIAGNOSIS — M5430 Sciatica, unspecified side: Secondary | ICD-10-CM | POA: Insufficient documentation

## 2011-03-13 ENCOUNTER — Ambulatory Visit
Admission: RE | Admit: 2011-03-13 | Discharge: 2011-03-13 | Disposition: A | Discharge status: Home / Self Care | Payer: Medicare Other | Source: Ambulatory Visit | Attending: Radiation Oncology | Admitting: Radiation Oncology

## 2011-03-13 ENCOUNTER — Other Ambulatory Visit: Payer: Self-pay | Admitting: Radiation Oncology

## 2011-03-13 ENCOUNTER — Other Ambulatory Visit (HOSPITAL_COMMUNITY)
Admission: RE | Admit: 2011-03-13 | Discharge: 2011-03-13 | Disposition: A | Discharge status: Home / Self Care | Payer: Medicare Other | Source: Ambulatory Visit | Attending: Radiation Oncology | Admitting: Radiation Oncology

## 2011-03-13 DIAGNOSIS — C549 Malignant neoplasm of corpus uteri, unspecified: Secondary | ICD-10-CM | POA: Insufficient documentation

## 2011-03-13 DIAGNOSIS — Z79899 Other long term (current) drug therapy: Secondary | ICD-10-CM | POA: Insufficient documentation

## 2011-03-13 LAB — COMPREHENSIVE METABOLIC PANEL
ALT: 8 U/L (ref 0–35)
AST: 12 U/L (ref 0–37)
Albumin: 3.5 g/dL (ref 3.5–5.2)
Calcium: 9.4 mg/dL (ref 8.4–10.5)
Glucose, Bld: 123 mg/dL — ABNORMAL HIGH (ref 70–99)
Total Bilirubin: 0.2 mg/dL — ABNORMAL LOW (ref 0.3–1.2)

## 2011-03-13 LAB — CBC WITH DIFFERENTIAL/PLATELET
EOS%: 0 % (ref 0.0–7.0)
Eosinophils Absolute: 0 10*3/uL (ref 0.0–0.5)
HGB: 12.6 g/dL (ref 11.6–15.9)
MCHC: 33.8 g/dL (ref 31.5–36.0)
MCV: 94.6 fL (ref 79.5–101.0)
MONO%: 0.4 % (ref 0.0–14.0)
NEUT%: 94.4 % — ABNORMAL HIGH (ref 38.4–76.8)
Platelets: 269 10*3/uL (ref 145–400)
RDW: 14.4 % (ref 11.2–14.5)
lymph#: 0.7 10*3/uL — ABNORMAL LOW (ref 0.9–3.3)

## 2011-03-14 ENCOUNTER — Other Ambulatory Visit: Payer: Self-pay | Admitting: Radiation Oncology

## 2011-03-14 DIAGNOSIS — M549 Dorsalgia, unspecified: Secondary | ICD-10-CM

## 2011-03-14 DIAGNOSIS — C55 Malignant neoplasm of uterus, part unspecified: Secondary | ICD-10-CM

## 2011-03-16 ENCOUNTER — Ambulatory Visit (HOSPITAL_COMMUNITY)
Admission: RE | Admit: 2011-03-16 | Discharge: 2011-03-16 | Disposition: A | Discharge status: Home / Self Care | Payer: Medicare Other | Source: Ambulatory Visit | Attending: Radiation Oncology | Admitting: Radiation Oncology

## 2011-03-16 DIAGNOSIS — C55 Malignant neoplasm of uterus, part unspecified: Secondary | ICD-10-CM

## 2011-03-16 DIAGNOSIS — K7689 Other specified diseases of liver: Secondary | ICD-10-CM | POA: Insufficient documentation

## 2011-03-16 DIAGNOSIS — I700 Atherosclerosis of aorta: Secondary | ICD-10-CM | POA: Insufficient documentation

## 2011-03-16 DIAGNOSIS — N269 Renal sclerosis, unspecified: Secondary | ICD-10-CM | POA: Insufficient documentation

## 2011-03-16 DIAGNOSIS — K449 Diaphragmatic hernia without obstruction or gangrene: Secondary | ICD-10-CM | POA: Insufficient documentation

## 2011-03-16 DIAGNOSIS — Z90710 Acquired absence of both cervix and uterus: Secondary | ICD-10-CM | POA: Insufficient documentation

## 2011-03-16 DIAGNOSIS — Q619 Cystic kidney disease, unspecified: Secondary | ICD-10-CM | POA: Insufficient documentation

## 2011-03-16 DIAGNOSIS — Z79899 Other long term (current) drug therapy: Secondary | ICD-10-CM | POA: Insufficient documentation

## 2011-03-16 DIAGNOSIS — M549 Dorsalgia, unspecified: Secondary | ICD-10-CM

## 2011-03-16 DIAGNOSIS — C549 Malignant neoplasm of corpus uteri, unspecified: Secondary | ICD-10-CM | POA: Insufficient documentation

## 2011-03-16 MED ORDER — IOHEXOL 300 MG/ML  SOLN
125.0000 mL | Freq: Once | INTRAMUSCULAR | Status: AC | PRN
  Administered 2011-03-16: 125 mL via INTRAVENOUS

## 2011-03-22 ENCOUNTER — Other Ambulatory Visit: Payer: Self-pay | Admitting: Orthopaedic Surgery

## 2011-03-22 ENCOUNTER — Other Ambulatory Visit: Payer: Self-pay | Admitting: Orthopedic Surgery

## 2011-03-22 DIAGNOSIS — M545 Low back pain: Secondary | ICD-10-CM

## 2011-03-28 ENCOUNTER — Other Ambulatory Visit: Payer: Medicare Other

## 2011-03-29 ENCOUNTER — Ambulatory Visit
Admission: RE | Admit: 2011-03-29 | Discharge: 2011-03-29 | Disposition: A | Discharge status: Home / Self Care | Payer: Medicare Other | Source: Ambulatory Visit | Attending: Orthopaedic Surgery | Admitting: Orthopaedic Surgery

## 2011-03-29 DIAGNOSIS — M545 Low back pain: Secondary | ICD-10-CM

## 2011-04-13 ENCOUNTER — Other Ambulatory Visit: Payer: Self-pay | Admitting: Physical Medicine and Rehabilitation

## 2011-04-13 DIAGNOSIS — M545 Low back pain: Secondary | ICD-10-CM

## 2011-04-16 ENCOUNTER — Ambulatory Visit
Admission: RE | Admit: 2011-04-16 | Discharge: 2011-04-16 | Disposition: A | Discharge status: Home / Self Care | Payer: Medicare Other | Source: Ambulatory Visit | Attending: Physical Medicine and Rehabilitation | Admitting: Physical Medicine and Rehabilitation

## 2011-04-16 DIAGNOSIS — M545 Low back pain: Secondary | ICD-10-CM

## 2011-04-26 ENCOUNTER — Other Ambulatory Visit: Payer: Self-pay | Admitting: Physical Medicine and Rehabilitation

## 2011-04-26 DIAGNOSIS — M545 Low back pain, unspecified: Secondary | ICD-10-CM

## 2011-04-30 ENCOUNTER — Ambulatory Visit
Admission: RE | Admit: 2011-04-30 | Discharge: 2011-04-30 | Disposition: A | Discharge status: Home / Self Care | Payer: Medicare Other | Source: Ambulatory Visit | Attending: Physical Medicine and Rehabilitation | Admitting: Physical Medicine and Rehabilitation

## 2011-04-30 ENCOUNTER — Other Ambulatory Visit: Payer: Self-pay | Admitting: Family Medicine

## 2011-04-30 DIAGNOSIS — M545 Low back pain, unspecified: Secondary | ICD-10-CM

## 2011-04-30 MED ORDER — METHYLPREDNISOLONE ACETATE 40 MG/ML INJ SUSP (RADIOLOG
120.0000 mg | Freq: Once | INTRAMUSCULAR | Status: AC
  Administered 2011-04-30: 120 mg via EPIDURAL

## 2011-04-30 MED ORDER — IOHEXOL 180 MG/ML  SOLN
1.0000 mL | Freq: Once | INTRAMUSCULAR | Status: AC | PRN
  Administered 2011-04-30: 1 mL via EPIDURAL

## 2011-05-14 ENCOUNTER — Ambulatory Visit
Admission: RE | Admit: 2011-05-14 | Discharge: 2011-05-14 | Disposition: A | Discharge status: Home / Self Care | Payer: Medicare Other | Source: Ambulatory Visit | Attending: Family Medicine | Admitting: Family Medicine

## 2011-05-14 ENCOUNTER — Other Ambulatory Visit: Payer: Self-pay | Admitting: Family Medicine

## 2011-05-14 DIAGNOSIS — M545 Low back pain: Secondary | ICD-10-CM

## 2011-05-14 MED ORDER — METHYLPREDNISOLONE ACETATE 40 MG/ML INJ SUSP (RADIOLOG
120.0000 mg | Freq: Once | INTRAMUSCULAR | Status: AC
  Administered 2011-05-14: 120 mg via EPIDURAL

## 2011-05-14 MED ORDER — IOHEXOL 180 MG/ML  SOLN
1.0000 mL | Freq: Once | INTRAMUSCULAR | Status: AC | PRN
  Administered 2011-05-14: 1 mL via EPIDURAL

## 2011-06-06 ENCOUNTER — Other Ambulatory Visit: Payer: Self-pay | Admitting: Gynecologic Oncology

## 2011-06-06 ENCOUNTER — Other Ambulatory Visit (HOSPITAL_COMMUNITY)
Admission: RE | Admit: 2011-06-06 | Discharge: 2011-06-06 | Disposition: A | Discharge status: Home / Self Care | Payer: Medicare Other | Source: Ambulatory Visit | Attending: Gynecologic Oncology | Admitting: Gynecologic Oncology

## 2011-06-06 ENCOUNTER — Ambulatory Visit: Payer: Medicare Other | Attending: Gynecologic Oncology | Admitting: Gynecologic Oncology

## 2011-06-06 DIAGNOSIS — M545 Low back pain, unspecified: Secondary | ICD-10-CM | POA: Insufficient documentation

## 2011-06-06 DIAGNOSIS — Z923 Personal history of irradiation: Secondary | ICD-10-CM | POA: Insufficient documentation

## 2011-06-06 DIAGNOSIS — M25559 Pain in unspecified hip: Secondary | ICD-10-CM | POA: Insufficient documentation

## 2011-06-06 DIAGNOSIS — C549 Malignant neoplasm of corpus uteri, unspecified: Secondary | ICD-10-CM | POA: Insufficient documentation

## 2011-06-06 DIAGNOSIS — Z8540 Personal history of malignant neoplasm of unspecified female genital organ: Secondary | ICD-10-CM | POA: Insufficient documentation

## 2011-06-13 NOTE — Consult Note (Signed)
NAMEMarland Kitchen  Christine [name], Christine [name] NO.:  [id number]  MEDICAL RECORD NO.:  [id number]  LOCATION:  GYN                          FACILITY:  [facility]  PHYSICIAN:  Christine [name]    DATE OF BIRTH:  29-Aug-1931  DATE OF CONSULTATION:  06/06/2011 DATE OF DISCHARGE:                                CONSULTATION   Christine [name] is a very pleasant 77, soon to be 77 year old, who underwent TAH-BSO in December 2009, for endometrial carcinoma.  Pathology revealed a superficially invasive high-grade carcinosarcoma confined to the inner half of the myometrium, but with cervical stromal involvement.  That was 0.1 out of 2.4 cm depth of cervical invasion with lymphovascular space involvement.  The adnexa were negative.  After lengthy discussions regarding chemotherapy and radiation, she opted for radiation therapy and she underwent postoperative external beam radiation and vaginal cuff brachytherapy under the care of Dr. Roselind [name].  I last saw her in February 2012, at which time, her exam was unremarkable, but her Pap smear revealed atypical squamous cells of undetermined significance.  Followup exam and Pap by Dr. Roselind [name] in May 2012, revealed a Pap smear that was negative, but there was reparative atypia consistent with a history of radiation therapy.  She comes in today for followup, accompanied by her daughter.  REVIEW OF SYSTEMS:  Really completely comprised essentially by the pain she has had in her hip and her lower back.  She has had x-rays, CT scans, MRIs, and injections to her back.  They have found a cyst that is pressing on her spine and nerves.  The injections of steroids did help, but she may be needing surgery.  She has been under the care of an orthopedist and is seeing a Careers adviser next week.  She was somewhat upset as she has required multiple prescriptions for pain medications from multiple providers including her primary care physician, the emergency room, and the orthopedist that  she got a letter stating that there was concern that she was "doctor shopping for narcotics" and she is quite hurt by that and that has not been her intention and in fact, may have the medications she has been prescribed, she states, including the tramadol, the OxyContin, and the oxycodone, she has not been able to tolerate well.  She denies any change with her lymphedema.  They have tried her on Lasix for this and she was not able to tolerate the higher dose of Lasix and lower dose of Lasix did not help with the lymphedema. She denies any change in her bowel or bladder habits.  She denies any vaginal bleeding.  She has limited mobility secondary to her lymphedema in her pain.  She denies any unintentional weight loss, weight gain, abdominal pain, or chest pain.  Ten-point review of systems is, otherwise, negative except as above.  MEDICATIONS:  Medication list is reviewed and is unchanged with the exception of the narcotics.  PHYSICAL EXAMINATION:  VITAL SIGNS:  Weight 346 pounds, which is down from 356 pounds; blood pressure 122/78; respirations 20; temperature 98. GENERAL:  Well-nourished, well-developed female, in no acute distress. NECK:  Supple.  There is no lymphadenopathy.  No thyromegaly. LUNGS:  Clear to auscultation bilaterally. CARDIOVASCULAR EXAM:  Regular rate and rhythm. ABDOMEN:  Morbidly obese.  She has a well-healed vertical midline incision.  There is no obvious incisional hernia.  She does have changes consistent with candida under the pannus.  Groins are negative for adenopathy. EXTREMITIES:  There is massive edema, equal bilaterally consistent with congenital lymphedema.  There are chronic venous stasis changes and scaling of her legs. PELVIC:  External genitalia are within normal limits.  The vagina is atrophic and foreshortened.  There are no gross visible lesions. ThinPrep Pap was submitted without difficulty.  Bimanual examination is limited by the patient's  habitus; however, there are no obvious masses or nodularity.  ASSESSMENT:  A 77 year old with a stage IIA uterine carcinosarcoma, who was incompletely staged.  She underwent postoperative radiation and is doing well with no evidence of recurrent disease almost 3 years out from time of her diagnosis.  PLAN: 1. We will follow up the results for her Pap smear from today.  She     will see Dr. Roselind [name] in 3 months and return to see [place] in 6 months.     At that time, it will be just after her 3-year anniversary and go     to every 78-month visits. 2. She will continue following up with her other providers as     scheduled.  Wished her the best of luck with her upcoming back     surgery. 3. We did discuss the possibility of her using one of the new lymph     flow machines.  I have had several patients who have lymphedema     secondary to surgical procedures have benefited from this.  I did     discuss this with her primary care physician and look for a PT     referral.     Christine [name] A. Duard Brady, [name]     PAG/MEDQ  D:  06/06/2011  T:  06/07/2011  Job:  [id number]  cc:   Christine [name]. Christine [name], M.D. Fax: [phone]  Christine [name], Ph.D., M.D. Fax: [phone]  Christine Colonel, [name] Fax: [phone]  Christine [name], R.N. [phone] [place], [place] [address]  Electronically Signed by Christine Mccreedy [name] on 06/13/2011 01:42:08 PM

## 2011-06-26 ENCOUNTER — Other Ambulatory Visit: Payer: Self-pay | Admitting: Internal Medicine

## 2011-06-26 DIAGNOSIS — Z1231 Encounter for screening mammogram for malignant neoplasm of breast: Secondary | ICD-10-CM

## 2011-07-16 DIAGNOSIS — J189 Pneumonia, unspecified organism: Secondary | ICD-10-CM

## 2011-07-16 DIAGNOSIS — I2699 Other pulmonary embolism without acute cor pulmonale: Secondary | ICD-10-CM

## 2011-07-16 HISTORY — DX: Other pulmonary embolism without acute cor pulmonale: I26.99

## 2011-07-16 HISTORY — DX: Pneumonia, unspecified organism: J18.9

## 2011-07-20 LAB — GLUCOSE, CAPILLARY
Glucose-Capillary: 101 mg/dL — ABNORMAL HIGH (ref 70–99)
Glucose-Capillary: 102 mg/dL — ABNORMAL HIGH (ref 70–99)
Glucose-Capillary: 104 mg/dL — ABNORMAL HIGH (ref 70–99)
Glucose-Capillary: 113 mg/dL — ABNORMAL HIGH (ref 70–99)
Glucose-Capillary: 120 mg/dL — ABNORMAL HIGH (ref 70–99)
Glucose-Capillary: 129 mg/dL — ABNORMAL HIGH (ref 70–99)
Glucose-Capillary: 96 mg/dL (ref 70–99)

## 2011-07-20 LAB — ABO/RH: ABO/RH(D): O POS

## 2011-07-20 LAB — COMPREHENSIVE METABOLIC PANEL
Albumin: 3.7 g/dL (ref 3.5–5.2)
BUN: 21 mg/dL (ref 6–23)
Calcium: 9.3 mg/dL (ref 8.4–10.5)
Chloride: 104 mEq/L (ref 96–112)
Creatinine, Ser: 1.44 mg/dL — ABNORMAL HIGH (ref 0.4–1.2)
GFR calc Af Amer: 43 mL/min — ABNORMAL LOW (ref >60)
GFR calc non Af Amer: 35 mL/min — ABNORMAL LOW (ref >60)
Total Bilirubin: 0.5 mg/dL (ref 0.3–1.2)

## 2011-07-20 LAB — BASIC METABOLIC PANEL
CO2: 22 mEq/L (ref 19–32)
Glucose, Bld: 149 mg/dL — ABNORMAL HIGH (ref 70–99)
Potassium: 4.7 mEq/L (ref 3.5–5.1)
Sodium: 135 mEq/L (ref 135–145)

## 2011-07-20 LAB — DIFFERENTIAL
Basophils Absolute: 0 10*3/uL (ref 0.0–0.1)
Lymphocytes Relative: 28 % (ref 12–46)
Lymphs Abs: 1.8 10*3/uL (ref 0.7–4.0)
Monocytes Absolute: 0.6 10*3/uL (ref 0.1–1.0)
Neutro Abs: 3.6 10*3/uL (ref 1.7–7.7)

## 2011-07-20 LAB — TYPE AND SCREEN: ABO/RH(D): O POS

## 2011-07-20 LAB — CBC
HCT: 36.6 % (ref 36.0–46.0)
HCT: 38 % (ref 36.0–46.0)
Hemoglobin: 12.3 g/dL (ref 12.0–15.0)
MCHC: 33.5 g/dL (ref 30.0–36.0)
MCHC: 33.7 g/dL (ref 30.0–36.0)
MCV: 92.7 fL (ref 78.0–100.0)
MCV: 92.8 fL (ref 78.0–100.0)
Platelets: 258 10*3/uL (ref 150–400)
RDW: 13.4 % (ref 11.5–15.5)
WBC: 6.3 10*3/uL (ref 4.0–10.5)

## 2011-07-26 DIAGNOSIS — I5032 Chronic diastolic (congestive) heart failure: Secondary | ICD-10-CM | POA: Diagnosis present

## 2011-08-02 ENCOUNTER — Ambulatory Visit
Admission: RE | Admit: 2011-08-02 | Discharge: 2011-08-02 | Disposition: A | Discharge status: Home / Self Care | Payer: Medicare Other | Source: Ambulatory Visit | Attending: Internal Medicine | Admitting: Internal Medicine

## 2011-08-02 ENCOUNTER — Other Ambulatory Visit: Payer: Self-pay | Admitting: Internal Medicine

## 2011-08-02 DIAGNOSIS — R0600 Dyspnea, unspecified: Secondary | ICD-10-CM

## 2011-08-09 ENCOUNTER — Emergency Department (HOSPITAL_COMMUNITY): Payer: Medicare Other

## 2011-08-09 ENCOUNTER — Inpatient Hospital Stay (HOSPITAL_COMMUNITY)
Admission: EM | Admit: 2011-08-09 | Discharge: 2011-08-15 | DRG: 175 | Disposition: A | Discharge status: Skilled Nursing Facility | Payer: Medicare Other | Attending: Internal Medicine | Admitting: Internal Medicine

## 2011-08-09 DIAGNOSIS — N189 Chronic kidney disease, unspecified: Secondary | ICD-10-CM | POA: Diagnosis present

## 2011-08-09 DIAGNOSIS — J189 Pneumonia, unspecified organism: Secondary | ICD-10-CM | POA: Diagnosis present

## 2011-08-09 DIAGNOSIS — I2699 Other pulmonary embolism without acute cor pulmonale: Principal | ICD-10-CM | POA: Diagnosis present

## 2011-08-09 DIAGNOSIS — M545 Low back pain, unspecified: Secondary | ICD-10-CM | POA: Diagnosis not present

## 2011-08-09 DIAGNOSIS — G2581 Restless legs syndrome: Secondary | ICD-10-CM | POA: Diagnosis present

## 2011-08-09 DIAGNOSIS — N179 Acute kidney failure, unspecified: Secondary | ICD-10-CM | POA: Diagnosis present

## 2011-08-09 DIAGNOSIS — I359 Nonrheumatic aortic valve disorder, unspecified: Secondary | ICD-10-CM | POA: Diagnosis present

## 2011-08-09 DIAGNOSIS — Z8542 Personal history of malignant neoplasm of other parts of uterus: Secondary | ICD-10-CM

## 2011-08-09 DIAGNOSIS — E785 Hyperlipidemia, unspecified: Secondary | ICD-10-CM | POA: Diagnosis present

## 2011-08-09 DIAGNOSIS — R5381 Other malaise: Secondary | ICD-10-CM | POA: Diagnosis not present

## 2011-08-09 DIAGNOSIS — R0602 Shortness of breath: Secondary | ICD-10-CM | POA: Diagnosis present

## 2011-08-09 DIAGNOSIS — M7989 Other specified soft tissue disorders: Secondary | ICD-10-CM

## 2011-08-09 DIAGNOSIS — D649 Anemia, unspecified: Secondary | ICD-10-CM | POA: Diagnosis present

## 2011-08-09 DIAGNOSIS — Z90710 Acquired absence of both cervix and uterus: Secondary | ICD-10-CM

## 2011-08-09 DIAGNOSIS — I129 Hypertensive chronic kidney disease with stage 1 through stage 4 chronic kidney disease, or unspecified chronic kidney disease: Secondary | ICD-10-CM | POA: Diagnosis present

## 2011-08-09 LAB — DIFFERENTIAL
Basophils Absolute: 0.1 10*3/uL (ref 0.0–0.1)
Lymphocytes Relative: 10 % — ABNORMAL LOW (ref 12–46)
Monocytes Absolute: 1.7 10*3/uL — ABNORMAL HIGH (ref 0.1–1.0)
Neutro Abs: 10.7 10*3/uL — ABNORMAL HIGH (ref 1.7–7.7)
Neutrophils Relative %: 75 % (ref 43–77)

## 2011-08-09 LAB — CBC
HCT: 35 % — ABNORMAL LOW (ref 36.0–46.0)
Hemoglobin: 11.2 g/dL — ABNORMAL LOW (ref 12.0–15.0)
RBC: 3.67 MIL/uL — ABNORMAL LOW (ref 3.87–5.11)
WBC: 14.2 10*3/uL — ABNORMAL HIGH (ref 4.0–10.5)

## 2011-08-09 LAB — POCT I-STAT, CHEM 8
Chloride: 103 mEq/L (ref 96–112)
HCT: 35 % — ABNORMAL LOW (ref 36.0–46.0)
Hemoglobin: 11.9 g/dL — ABNORMAL LOW (ref 12.0–15.0)
Potassium: 4.6 mEq/L (ref 3.5–5.1)
Sodium: 135 mEq/L (ref 135–145)

## 2011-08-09 LAB — BASIC METABOLIC PANEL
BUN: 17 mg/dL (ref 6–23)
Chloride: 100 mEq/L (ref 96–112)
GFR calc Af Amer: 43 mL/min — ABNORMAL LOW (ref >90)
GFR calc non Af Amer: 37 mL/min — ABNORMAL LOW (ref >90)
Glucose, Bld: 120 mg/dL — ABNORMAL HIGH (ref 70–99)
Potassium: 4 mEq/L (ref 3.5–5.1)
Sodium: 133 mEq/L — ABNORMAL LOW (ref 135–145)

## 2011-08-09 LAB — CARDIAC PANEL(CRET KIN+CKTOT+MB+TROPI)
CK, MB: 2.6 ng/mL (ref 0.3–4.0)
Relative Index: 0.9 (ref 0.0–2.5)
Total CK: 303 U/L — ABNORMAL HIGH (ref 7–177)

## 2011-08-09 LAB — POCT I-STAT TROPONIN I: Troponin i, poc: 0.02 ng/mL (ref 0.00–0.08)

## 2011-08-09 LAB — PROTIME-INR: INR: 1.05 (ref 0.00–1.49)

## 2011-08-09 LAB — PHOSPHORUS: Phosphorus: 2.7 mg/dL (ref 2.3–4.6)

## 2011-08-09 LAB — PRO B NATRIURETIC PEPTIDE: Pro B Natriuretic peptide (BNP): 96 pg/mL (ref 0–450)

## 2011-08-09 MED ORDER — XENON XE 133 GAS
6.4000 | GAS_FOR_INHALATION | Freq: Once | RESPIRATORY_TRACT | Status: AC | PRN
  Administered 2011-08-09: 6.4 via RESPIRATORY_TRACT

## 2011-08-09 MED ORDER — TECHNETIUM TO 99M ALBUMIN AGGREGATED
5.0000 | Freq: Once | INTRAVENOUS | Status: AC | PRN
  Administered 2011-08-09: 5 via INTRAVENOUS

## 2011-08-09 NOTE — H&P (Signed)
NAMEMarland Kitchen  SHIVANGI, LUTZ NO.:  [id number]  MEDICAL RECORD NO.:  [id number]  LOCATION:  WLED                         FACILITY:  [facility]  PHYSICIAN:  Debbora Presto, MD DATE OF BIRTH:  [birth year]  DATE OF ADMISSION:  08/09/2011 DATE OF DISCHARGE:                             HISTORY & PHYSICAL   PATIENT'S PRIMARY CARE PHYSICIAN:  Paola A. Duard Brady, MD  CHIEF CONCERN:  Shortness of breath.  HISTORY OF PRESENT ILLNESS:  The patient is a very pleasant 77 year old female who presents to [facility] ED with main concern of progressively worsening shortness of breath.  This started approximately 3 weeks prior to admission.  The patient reports seeing a primary care physician in outpatient setting and was told she has pneumonia for which she was treated with Avelox for 7 days total.  She reports completing the treatment with antibiotics and prednisone, but her symptoms have not gotten any better.  In fact, she has gotten worse and now reports fevers and chills, nonproductive cough, poor appetite.  In addition, she reports nausea but no vomiting, she denies diarrhea or constipation.  No blood in urine or stool.  No orthopnea, no PND, also denies chest pain. She reports shortness of breath being worse with exertion.  The patient lives with daughter and 2 grand kids.  One of her grand kids was sick with bronchitis approximately 1-2 weeks ago.  The patient denies smoking or anyone else in the household smoking.  The patient also denies lower extremity pain, weakness, headaches, visual changes.  ALLERGIES:  No known medical allergies.  HOME MEDICATIONS: 1. Diovan 320/25 mg tablet daily. 2. Omega-3 1 g 2 capsules daily. 3. Metoprolol 25 mg tablet daily. 4. Clonidine 0.2 mg tablet daily. 5. Zetia 10 mg tablet daily. 6. Lotrel which is amlodipine. 7. Benazepril 10/40 mg tablet once daily. 8. Norco 10/325 mg tablet q.4 h. as needed for pain. 9. Reglan 10 mg tablet daily as  needed.  FAMILY HISTORY:  Noncontributory.  SOCIAL HISTORY:  The patient denies alcohol, drug or tobacco use and lives at home with daughter, her brother-in-law and 2 grand kids, age 26 and 66.  REVIEW OF SYSTEMS:  Per HPI.  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 Per pathology results, superficially invasive high grade carcinosarcoma.  PHYSICAL EXAMINATION:  VITAL SIGNS:  Temperature 98.6, heart rate 80, blood pressure 146/47, respirations 18, saturating 99% on room air. GENERAL:  The patient is lying in bed, not in acute distress, cooperative to examination. HEENT:  Head atraumatic, PERRLA, EOMI.  No scleral icterus. NECK:  Supple.  No thyroid enlargement.  Full range of motion. CARDIOVASCULAR:  Regular rate and rhythm, S1, S2 present.  No murmurs, rubs, gallops.  No JVD. LUNGS:  Clear to auscultation bilaterally.  Minimal bibasilar crackles, no wheezing or rhonchi. ABDOMEN:  Soft, nontender, nondistended.  Bowel sounds present. EXTREMITIES:  Chronic venous stasis changes and bilateral lower extremity edema.  Pulses are palpable +1 bilaterally. NEUROLOGIC:  Grossly nonfocal.  The patient is alert, oriented x3. Motor strength 5/5 throughout.  Sensation intact to soft touch, cerebellar functions  intact.  WBC 14.2, hemoglobin 11.2, hematocrit 35, platelets 354, D-dimer 1.35. Troponin 1st set negative.  Sodium 135, potassium 4.6, chloride 103, glucose 116, BUN 18, creatinine 1.5, bicarb 29, BNP 96.  PT 13.9, INR 1.05, PTT 21.  Chest x-ray, improved left lower lobe airspace disease, cardiomegaly and pulmonary vascular congestion, bronchitis changes.  VQ scan, intermediate probability for pulmonary embolus.  ASSESSMENT AND PLAN: 1. Pulmonary embolus-intermediate probability per V/Q scan in patient     who has high history of endometrial  carcinosarcoma in 2009, and     recent pneumonia.  We will admit the patient to telemetry floor and     monitor her vitals.  We will cycle cardiac enzymes x3 and obtain 12-     lead EKG.  The patient is currently hemodynamically stable and not     in acute distress.  We will treat her with Lovenox and she will     need transition to Coumadin. 2. Pneumonia.  This is left lower lobe pneumonia per chest x-ray     reading, appears to be resolving but patient is still symptomatic     with fevers and cough which could certainly be attributed to     pulmonary embolus as well.  However, I will continue treating with     Avelox for additional 3-4 days. 3. Hypertension, currently at goal.  We will hold ACEs and ARBs, but     we will continue Norvasc, metoprolol, and clonidine for now. 4. Acute on chronic renal failure - appears the baseline creatinine is     anywhere from 1-1.2.  We will obtain BMET in the morning to follow     up on test.  We will hold ACE inhibitors and ARBs.  I am not sure     that the patient needs to continue to be on both treatments.  Upon     discharge, she should perhaps only continue ACE inhibitor. 5. Deep vein thrombosis prophylaxis, on Lovenox for pulmonary     embolism.  Education provided to the patient and family at bedside,     daughter and sister.  The patient and family members have     verbalized understanding of the plan of care and treatment.  They     agreed with plan of care over 30 minutes.     Debbora Presto, MD     IM/MEDQ  D:  08/09/2011  T:  08/09/2011  Job:  [id number]

## 2011-08-10 DIAGNOSIS — I2699 Other pulmonary embolism without acute cor pulmonale: Secondary | ICD-10-CM

## 2011-08-10 DIAGNOSIS — R0602 Shortness of breath: Secondary | ICD-10-CM

## 2011-08-10 LAB — CARDIAC PANEL(CRET KIN+CKTOT+MB+TROPI)
Relative Index: 0.8 (ref 0.0–2.5)
Total CK: 243 U/L — ABNORMAL HIGH (ref 7–177)
Troponin I: <0.3 ng/mL (ref <0.30)

## 2011-08-10 LAB — URINALYSIS, ROUTINE W REFLEX MICROSCOPIC
Glucose, UA: NEGATIVE mg/dL
Hgb urine dipstick: NEGATIVE
Leukocytes, UA: NEGATIVE
Protein, ur: NEGATIVE mg/dL
Specific Gravity, Urine: 1.013 (ref 1.005–1.030)
pH: 6 (ref 5.0–8.0)

## 2011-08-10 LAB — BASIC METABOLIC PANEL
CO2: 25 mEq/L (ref 19–32)
Chloride: 103 mEq/L (ref 96–112)
Glucose, Bld: 95 mg/dL (ref 70–99)
Potassium: 4.1 mEq/L (ref 3.5–5.1)
Sodium: 137 mEq/L (ref 135–145)

## 2011-08-10 LAB — HEMOGLOBIN A1C: Hgb A1c MFr Bld: 5.8 % — ABNORMAL HIGH (ref <5.7)

## 2011-08-10 LAB — CBC
HCT: 31.7 % — ABNORMAL LOW (ref 36.0–46.0)
MCHC: 32.2 g/dL (ref 30.0–36.0)
MCV: 94.9 fL (ref 78.0–100.0)
Platelets: 342 10*3/uL (ref 150–400)
RDW: 13.7 % (ref 11.5–15.5)
WBC: 10.3 10*3/uL (ref 4.0–10.5)

## 2011-08-10 LAB — LIPID PANEL
HDL: 24 mg/dL — ABNORMAL LOW (ref >39)
LDL Cholesterol: 122 mg/dL — ABNORMAL HIGH (ref 0–99)
Triglycerides: 137 mg/dL (ref <150)
VLDL: 27 mg/dL (ref 0–40)

## 2011-08-10 LAB — SODIUM, URINE, RANDOM: Sodium, Ur: 114 mEq/L

## 2011-08-10 LAB — CREATININE, URINE, RANDOM: Creatinine, Urine: 112.7 mg/dL

## 2011-08-11 LAB — BASIC METABOLIC PANEL
Calcium: 8.5 mg/dL (ref 8.4–10.5)
Chloride: 99 mEq/L (ref 96–112)
Creatinine, Ser: 1.4 mg/dL — ABNORMAL HIGH (ref 0.50–1.10)
GFR calc Af Amer: 41 mL/min — ABNORMAL LOW (ref >90)
Sodium: 132 mEq/L — ABNORMAL LOW (ref 135–145)

## 2011-08-11 LAB — CBC
Hemoglobin: 9.7 g/dL — ABNORMAL LOW (ref 12.0–15.0)
MCHC: 32 g/dL (ref 30.0–36.0)
WBC: 9 10*3/uL (ref 4.0–10.5)

## 2011-08-11 LAB — DIFFERENTIAL
Basophils Absolute: 0 10*3/uL (ref 0.0–0.1)
Basophils Relative: 0 % (ref 0–1)
Lymphocytes Relative: 13 % (ref 12–46)
Monocytes Absolute: 1.3 10*3/uL — ABNORMAL HIGH (ref 0.1–1.0)
Neutro Abs: 6.1 10*3/uL (ref 1.7–7.7)
Neutrophils Relative %: 68 % (ref 43–77)

## 2011-08-11 LAB — PROTIME-INR
INR: 1.24 (ref 0.00–1.49)
Prothrombin Time: 15.9 seconds — ABNORMAL HIGH (ref 11.6–15.2)

## 2011-08-12 LAB — BASIC METABOLIC PANEL
Calcium: 8.6 mg/dL (ref 8.4–10.5)
GFR calc Af Amer: 45 mL/min — ABNORMAL LOW (ref >90)
GFR calc non Af Amer: 39 mL/min — ABNORMAL LOW (ref >90)
Sodium: 132 mEq/L — ABNORMAL LOW (ref 135–145)

## 2011-08-12 LAB — CBC
Platelets: 301 10*3/uL (ref 150–400)
RBC: 3.07 MIL/uL — ABNORMAL LOW (ref 3.87–5.11)
RDW: 14.2 % (ref 11.5–15.5)
WBC: 8.9 10*3/uL (ref 4.0–10.5)

## 2011-08-12 LAB — PROTIME-INR: INR: 1.31 (ref 0.00–1.49)

## 2011-08-13 LAB — BASIC METABOLIC PANEL WITH GFR
BUN: 21 mg/dL (ref 6–23)
CO2: 28 meq/L (ref 19–32)
Calcium: 8.9 mg/dL (ref 8.4–10.5)
Chloride: 103 meq/L (ref 96–112)
Creatinine, Ser: 1.57 mg/dL — ABNORMAL HIGH (ref 0.50–1.10)
GFR calc Af Amer: 36 mL/min — ABNORMAL LOW
GFR calc non Af Amer: 31 mL/min — ABNORMAL LOW
Glucose, Bld: 95 mg/dL (ref 70–99)
Potassium: 4.1 meq/L (ref 3.5–5.1)
Sodium: 137 meq/L (ref 135–145)

## 2011-08-13 LAB — PROTIME-INR
INR: 1.48 (ref 0.00–1.49)
Prothrombin Time: 18.2 s — ABNORMAL HIGH (ref 11.6–15.2)

## 2011-08-13 LAB — URINE CULTURE: Special Requests: NEGATIVE

## 2011-08-14 LAB — PROTIME-INR: Prothrombin Time: 22.8 seconds — ABNORMAL HIGH (ref 11.6–15.2)

## 2011-08-14 LAB — CBC
MCH: 30.2 pg (ref 26.0–34.0)
Platelets: 299 10*3/uL (ref 150–400)
RBC: 3.21 MIL/uL — ABNORMAL LOW (ref 3.87–5.11)
RDW: 14.3 % (ref 11.5–15.5)
WBC: 8.2 10*3/uL (ref 4.0–10.5)

## 2011-08-14 LAB — BASIC METABOLIC PANEL
CO2: 25 mEq/L (ref 19–32)
Calcium: 8.7 mg/dL (ref 8.4–10.5)
GFR calc Af Amer: 35 mL/min — ABNORMAL LOW (ref >90)
Sodium: 136 mEq/L (ref 135–145)

## 2011-08-14 NOTE — H&P (Signed)
NAMEMarland Kitchen  Christine [name], Christine [name] NO.:  [id number]  MEDICAL RECORD NO.:  [id number]  LOCATION:  1416                         FACILITY:  [facility]  PHYSICIAN:  Debbora Presto, MD DATE OF BIRTH:  [birth year]  DATE OF ADMISSION:  08/09/2011 DATE OF DISCHARGE:                             HISTORY & PHYSICAL   PATIENT'S PRIMARY CARE PHYSICIAN:  Paola A. Duard Brady, MD  CHIEF CONCERN:  Shortness of breath.  HISTORY OF PRESENT ILLNESS:  The patient is a very pleasant 77 year old female who presents to [facility] ED with main concern of progressively worsening shortness of breath.  This started approximately 3 weeks prior to admission.  The patient reports seeing a primary care physician in outpatient setting and was told she has pneumonia for which she was treated with Avelox for 7 days total.  She reports completing the treatment with antibiotics and prednisone, but her symptoms have not gotten any better.  In fact, she has gotten worse and now reports fevers and chills, nonproductive cough, poor appetite.  In addition, she reports nausea but no vomiting, she denies diarrhea or constipation.  No blood in urine or stool.  No orthopnea, no PND, also denies chest pain. She reports shortness of breath being worse with exertion.  The patient lives with daughter and 2 grand kids.  One of her grand kids was sick with bronchitis approximately 1-2 weeks ago.  The patient denies smoking or anyone else in the household smoking.  The patient also denies lower extremity pain, weakness, headaches, visual changes.  ALLERGIES:  No known medical allergies.  HOME MEDICATIONS: 1. Diovan 320/25 mg tablet daily. 2. Omega-3 1 g 2 capsules daily. 3. Metoprolol 25 mg tablet daily. 4. Clonidine 0.2 mg tablet daily. 5. Zetia 10 mg tablet daily. 6. Lotrel which is amlodipine. 7. Benazepril 10/40 mg tablet once daily. 8. Norco 10/325 mg tablet q.4 h. as needed for pain. 9. Reglan 10 mg tablet daily as  needed.  FAMILY HISTORY:  Noncontributory.  SOCIAL HISTORY:  The patient denies alcohol, drug or tobacco use and lives at home with daughter, her brother-in-law and 2 grand kids, age 3 and 14.  REVIEW OF SYSTEMS:  Per HPI.  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 Per pathology results, superficially invasive high grade carcinosarcoma.  PHYSICAL EXAMINATION:  VITAL SIGNS:  Temperature 98.6, heart rate 80, blood pressure 146/47, respirations 18, saturating 99% on room air. GENERAL:  The patient is lying in bed, not in acute distress, cooperative to examination. HEENT:  Head atraumatic, PERRLA, EOMI.  No scleral icterus. NECK:  Supple.  No thyroid enlargement.  Full range of motion. CARDIOVASCULAR:  Regular rate and rhythm, S1, S2 present.  No murmurs, rubs, gallops.  No JVD. LUNGS:  Clear to auscultation bilaterally.  Minimal bibasilar crackles, no wheezing or rhonchi. ABDOMEN:  Soft, nontender, nondistended.  Bowel sounds present. EXTREMITIES:  Chronic venous stasis changes and bilateral lower extremity edema.  Pulses are palpable +1 bilaterally. NEUROLOGIC:  Grossly nonfocal.  The patient is alert, oriented x3. Motor strength 5/5 throughout.  Sensation intact to soft touch, cerebellar functions  intact.  WBC 14.2, hemoglobin 11.2, hematocrit 35, platelets 354, D-dimer 1.35. Troponin 1st set negative.  Sodium 135, potassium 4.6, chloride 103, glucose 116, BUN 18, creatinine 1.5, bicarb 29, BNP 96.  PT 13.9, INR 1.05, PTT 21.  Chest x-ray, improved left lower lobe airspace disease, cardiomegaly and pulmonary vascular congestion, bronchitis changes.  V/Q scan, intermediate probability for pulmonary embolus.  ASSESSMENT AND PLAN: 1. Pulmonary embolus-intermediate probability per V/Q scan in patient     who has high history of endometrial  carcinosarcoma in 2009, and     recent pneumonia.  We will admit the patient to telemetry floor and     monitor her vitals.  We will cycle cardiac enzymes x3 and obtain 12-     lead EKG.  The patient is currently hemodynamically stable and not     in acute distress.  We will treat her with Lovenox and she will     need transition to Coumadin. 2. Pneumonia.  This is left lower lobe pneumonia per chest x-ray     reading, appears to be resolving but patient is still symptomatic     with fevers and cough which could certainly be attributed to     pulmonary embolus as well.  However, I will continue treating with     Avelox for additional 3-4 days. 3. Hypertension, currently at goal.  We will hold ACEs and ARBs, but     we will continue Norvasc, metoprolol, and clonidine for now. 4. Acute on chronic renal failure - appears the baseline creatinine is     anywhere from 1-1.2.  We will obtain BMET in the morning to follow     up on test.  We will hold ACE inhibitors and ARBs.  I am not sure     that the patient needs to continue to be on both treatments.  Upon     discharge, she should perhaps only continue ACE inhibitor. 5. Deep vein thrombosis prophylaxis, on Lovenox for pulmonary     embolism.  Education provided to the patient and family at bedside, daughter and sister.  The patient and family members have verbalized understanding of the plan of care and treatment and they agreed with plan of care.  Over 30 minutes __________.     Debbora Presto, MD     IM/MEDQ  D:  08/09/2011  T:  08/13/2011  Job:  [id number]  Electronically Signed by Debbora Presto MD on 08/14/2011 01:59:23 PM

## 2011-08-15 ENCOUNTER — Ambulatory Visit: Payer: Medicare Other

## 2011-08-15 LAB — BASIC METABOLIC PANEL
Calcium: 8.5 mg/dL (ref 8.4–10.5)
GFR calc Af Amer: 32 mL/min — ABNORMAL LOW (ref >90)
GFR calc non Af Amer: 28 mL/min — ABNORMAL LOW (ref >90)
Glucose, Bld: 105 mg/dL — ABNORMAL HIGH (ref 70–99)
Potassium: 4 mEq/L (ref 3.5–5.1)
Sodium: 136 mEq/L (ref 135–145)

## 2011-08-15 LAB — CBC
MCV: 94.8 fL (ref 78.0–100.0)
Platelets: 269 10*3/uL (ref 150–400)
RBC: 3.09 MIL/uL — ABNORMAL LOW (ref 3.87–5.11)
RDW: 14.5 % (ref 11.5–15.5)
WBC: 6.3 10*3/uL (ref 4.0–10.5)

## 2011-08-15 LAB — PROTIME-INR
INR: 2.71 — ABNORMAL HIGH (ref 0.00–1.49)
Prothrombin Time: 29.2 seconds — ABNORMAL HIGH (ref 11.6–15.2)

## 2011-08-16 NOTE — Discharge Summary (Signed)
  NAMEMarland [name]  MARIANNA, CID NO.:  [id number]  MEDICAL RECORD NO.:  [id number]  LOCATION:  1416                         FACILITY:  [facility]  PHYSICIAN:  Ramiro Harvest, MD    DATE OF BIRTH:  07/21/1931  DATE OF ADMISSION:  08/09/2011 DATE OF DISCHARGE:                        DISCHARGE SUMMARY    ADDENDUM:  UPDATED DISCHARGE MEDICATION LIST:  Coumadin doses will be dictated per discharging physician. 1. Norvasc 10 p.o. daily. 2. Lovenox 160 mg subcutaneously b.i.d. 3. Toprol-XL p.o. daily. 4. MiraLax 17 g p.o. daily. 5. Senokot tablet 2 tablets p.o. daily p.r.n. 6. Coumadin 0.2 mg p.o. daily. 7. Norco 10/325 one tablet p.o. daily. 8. Hydrochlorothiazide 25 mg p.o. daily. 9. Omega-3 acid ethyl esters 2 capsules p.o. daily. 10.Reglan 10 mg p.o. daily p.r.n. 11.Zetia 10 mg p.o. daily.  This is the updated medication list, Coumadin doses will be dictated per discharging physician.  This is an addendum to job [id number].  This has been pleasure taking of Ms. Charline Bills.     Ramiro Harvest, MD     DT/MEDQ  D:  08/14/2011  T:  08/14/2011  Job:  [id number]  Electronically Signed by Ramiro Harvest MD on 08/16/2011 10:39:56 AM

## 2011-08-16 NOTE — Discharge Summary (Signed)
NAMEMarland Kitchen  Christine [name], Christine [name] NO.:  [id number]  MEDICAL RECORD NO.:  [id number]  LOCATION:  1416                         FACILITY:  [facility]  PHYSICIAN:  Ramiro Harvest, MD    DATE OF BIRTH:  1930/11/25  DATE OF ADMISSION:  08/09/2011 DATE OF DISCHARGE:                        DISCHARGE SUMMARY    PRIMARY CARE PHYSICIAN:  Merlene Laughter. Renae Gloss, M.D.  DISCHARGE DIAGNOSES: 1. Probable pulmonary embolism. 2. Community-acquired pneumonia, status post treatment. 3. Hypertension. 4. Acute on chronic kidney disease, stable, currently at baseline. 5. Anemia. 6. Musculoskeletal right-sided low back pain, improving. 7. Restless legs syndrome. 8. Hypertension. 9. Hyperlipidemia. 10.History of endometrial carcinoma, diagnosed in December, 2009,     status post total abdominal hysterectomy/bilateral salpingo-     oophorectomy. 11.Moderate aortic valve stenosis per 2D echo.  DISCHARGE MEDICATIONS: 1. Lovenox 160 mg subcutaneously b.i.d., number of days to be     determined per discharging physician. 2. Toprol-XL 25 mg p.o. daily. 3. MiraLax 17 g p.o. daily. 4. Senokot 2 tablets p.o. daily p.r.n. 5. Clonidine 0.2 mg p.o. daily. 6. Diovan HCT 320/25, 1 tablet p.o. daily. 7. Lotrel 10/40 one capsule p.o. daily. 8. Norco 10/325, one tablet p.o. q.4 hours p.r.n. 9. Omega-3 acid ethyl esters 2 capsules by mouth daily. 10.Reglan 10 mg p.o. daily p.r.n. 11.Zetia 10 mg p.o. daily. 12.Mirapex 0.125 mg p.o. nightly. 13.Coumadin dose to be determined per discharging physician.  DISPOSITION AND FOLLOWUP:  The patient will be discharged to [facility], a skilled nursing facility on August 15, 2011, where she will need a Coumadin level drawn and a PT/INR level will need to be drawn and further Coumadin doses will be determined per physician at the nursing facility.  Dose of Coumadin as well as how many more days of Lovenox bridge the patient will need, will be determined per  discharging physician.  The patient will need to follow up with PCP in 1 week post discharge to reassess her community-acquired pneumonia as well as probable PE.  The patient's PCP will need to follow up on 2D echo results of the patient's moderate aortic valve stenosis.  The patient will likely benefit from outpatient cardiology evaluation.  CONSULTATIONS DONE DURING THIS HOSPITALIZATION:  None.  PROCEDURES PERFORMED:  A chest x-ray was done on August 09, 2011, that showed improvement of lower lobe airspace disease, cardiomegaly and pulmonary vascular congestion, bronchitic change.  V/Q scan done August 09, 2011, shows intermediate probability for pulmonary embolism.  A 2D echo was done August 10, 2011.  2D echo showed a mildly dilated left ventricular cavity size, pattern of moderate LVH.  There was moderate concentric hypertrophy.  Systolic function was normal.  EF 55% to 60%. There was no regional wall motion abnormalities.  There was an increased relative contribution of atrial contraction to the ventricular filling. Doppler parameters consistent with grade 1 diastolic dysfunction. Doppler parameters also consistent with elevated mean left atrial filling pressure, moderately calcified aortic valve leaflets.  Valve mobility was mildly restricted.  There was moderate stenosis.  Valve area of 1.2 square centimeter.  Valve area of 1.14 square centimeter. Mitral valve with moderately calcified annulus, mild regurgitation. Atrial septum with no defect.  Patent  foraminal valve was identified, mild tricuspid valvular regurgitation.  Pericardium showed a trivial pericardial effusion was identified.  Bilateral lower extremity Dopplers were obtained.  Bilateral lower extremity Dopplers were negative for deep vein or superficial thrombosis involving the right lower extremity and the left lower extremity.  There was no evidence of Baker cyst on the right or the left.  BRIEF ADMISSION  HISTORY AND PHYSICAL:  Christine [name] is a very pleasant, obese 77 year old, African American female that presented to the [facility] ED with the main concern of progressive worsening shortness of breath, which started approximately 3 weeks prior to admission.  The patient reported seeing her PCP in the outpatient setting, was told she had pneumonia, was treated with Avelox for 7 days total.  She reported completing treatment with antibiotics and prednisone, but the symptoms had not gotten any better.  In fact, she had gotten worse, reported fever and chills, nonproductive cough, poor appetite.  In addition, she reported nausea, but no vomiting.  Denied any diarrhea or constipation.  No blood in the urine or stool.  No orthopnea.  No PND.  Also some chest pain.  She also reported shortness of breath, being worse with exertion.  The patient lives with her daughter and had 2 grandkids, one of her grandkids was sick with bronchitis approximately 1 to 2 weeks ago.  The patient denied any smoking or any one else in the house was smoking.  The patient also denied any lower extremity pain, weakness, headaches, or visual changes. For the rest of admission H and P, please see H and P dictated by Dr. Verta Ellen of job [phone].  HOSPITAL COURSE: 1. Probable pulmonary embolus.  The patient was admitted with a     probable pulmonary embolus.  V/Q scan, which showed intermediate     probability, which was felt to be the patient's likely cause of     shortness of breath on exertion.  She did have a high history of     endometrial carcinosarcoma.  She did have a history of endometrial     carcinosarcoma in 2009, and a recent pneumonia.  As such it was     felt that the patient did have increase risk of being in a     hypercoagulable state, as well as having being decreased mobility.     Cardiac enzymes were cycled x3, which were negative.  A 2D echo was     obtained with results as stated above,  which did not show any right     ventricular strain, however, did show some moderate aortic valve     stenosis.  The patient was maintained on a Lovenox bridge as well     as with Coumadin, and I was titrated.  The patient improved     somewhat clinically during the hospitalization and was seen by     PT/OT, and it was recommended that the patient will benefit from a     skilled nursing facility.  The patient will subsequently be     discharged to a skilled nursing facility hopefully on August 15, 2011, at which point in time the Lovenox bridge as well as the     Coumadin will be managed over there.  The patient is currently in     stable condition. 2. Community-acquired pneumonia.  The patient had recently been     treated with a left lower lobe pneumonia per her PCP.  Chest x-ray  reading did show some resolving pneumonia; however, it was felt     that as the patient was symptomatic with fevers and coughs, we     would continue treating the patient with Avelox.  The patient was     treated for an additional 5 days with Avelox and improved     clinically.  Tessalon Perles were added to her regimen.  The     patient improved clinically. 3. Acute on chronic kidney disease.  On admission, the patient was     noted to have some acute on chronic kidney disease.  The patient     was felt to be secondary to a prerenal azotemia.  ACE inhibitor and     ARBs were held.  The patient improved clinically.  She will be     started back on her ARB and her ACE inhibitor on discharge and this     will need to be followed up as an outpatient. 4. Moderate aortic valve stenosis.  This was noted on 2D echo that was     obtained on August 10, 2011, and the patient did indeed present     with some shortness of breath on exertion.  We will consult with     Cardiology for further evaluation and recommendations on this     moderate aortic valve stenosis.  This may be part of the etiology     of her  presentation with symptoms of worsening shortness of breath     on exertion.  At this time of this dictation, a cardiology consult     is currently pending and further recommendations will be made per     Cardiology. 5. Musculoskeletal back pain.  The patient during the hospitalization     did complain of some right lower sided musculoskeletal pain.     Patient's right lower side was tender to palpation.  She was placed     on a heating pad as well as scheduled ibuprofen.  This will need to     be followed up upon as an outpatient. 6. Restless legs syndrome.  The patient did have some complaints of     some restless legs.  The patient has been started on Mirapex and     this may be followed up as an outpatient.  The rest of the     patient's chronic medical issues have remained stable throughout     the hospitalization, and the patient is currently awaiting a     cardiology evaluation for further recommendations.  It has been a pleasure taking care of Christine [name].     Ramiro Harvest, MD     DT/MEDQ  D:  08/14/2011  T:  08/14/2011  Job:  [id number]  cc:   Merlene Laughter. Renae Gloss, M.D. Fax: [phone]  Electronically Signed by Ramiro Harvest MD on 08/16/2011 10:40:18 AM

## 2011-08-17 NOTE — Consult Note (Signed)
NAMEMarland [name]  OZELLA, COMINS NO.:  [id number]  MEDICAL RECORD NO.:  [id number]  LOCATION:  1416                         FACILITY:  [facility]  PHYSICIAN:  Nanetta Batty, M.D.   DATE OF BIRTH:  [birth year]  DATE OF CONSULTATION:  08/14/2011 DATE OF DISCHARGE:                                CONSULTATION   CHIEF COMPLAINT:  Shortness of breath.  HISTORY OF PRESENT ILLNESS:  [name] is a delightful 77 year old female with morbid obesity, hypertension, and lymphedema.  She has been followed by Dr. Allyson Sabal.  The patient states she had a remote heart attack in the late 80s, but does not remember having an angioplasty.  Dr. Allyson Sabal has done an echo on her.  She does have known mild-to-moderate aortic stenosis with a valve area of 1 cm and a peak gradient of 20 mmHg in 2010.  This note also indicates she had a low risk Myoview in December 2009.  She has dyslipidemia and hypertension.  She was admitted on August 09, 2011, with increasing shortness of breath.  Her BNP on admission was 96.  Her troponin was negative.  White count 14,000. Echocardiogram suggested a left lower lobe pneumonia which was treated. She also had a V/Q scan which showed an intermediate probability for pulmonary embolism, this is being treated.  The patient was actually nearing discharge when it was noted that she had significant dyspnea on exertion.  In the hospital.  An echocardiogram that was done August 10, 2011, did show aortic stenosis, and we are asked to see her in consult to see whether this may be contributing.  Aortic valve area on her echocardiogram August 10, 2011, was 1.2 cm.  Peak gradient was 47 mm with a mean of 26 mm.  Overall, LV function was normal with grade 1 diastolic dysfunction.  PAST MEDICAL HISTORY:  Remarkable for endometrial cancer, she had surgery in December 2009.  She has treated dyslipidemia.  She has treated hypertension.  She has chronic lymphedema in her  lower extremities.  She does have renal insufficiency, her creatinine was 1.5 on admission.  She was treated for hypertension.  CURRENT MEDICATIONS: 1. Norvasc 10 mg a day. 2. Clonidine 0.2 daily. 3. Zetia 10 mg a day. 4. Metoprolol 25 mg daily. 5. Avelox 400 mg a day. 6. Coumadin as directed. 7. Reglan p.r.n. 8. Lovenox protocol.  ALLERGIES:  She has no known drug allergies.  SOCIAL HISTORY:  She is divorced.  She has 5 children.  She stays with one of her daughters.  She worked at [employer] for years.  She is a nonsmoker.  She states she gets around pretty good at home.  She is very active in her church.  FAMILY HISTORY:  Remarkable that her father died in his early 55s, most likely from heart attack.  REVIEW OF SYSTEMS:  Essentially unremarkable except for noted above.  PHYSICAL EXAMINATION:  VITAL SIGNS:  Blood pressure 120/60, pulse 78, temp 98.2, O2 sat is 99% on room air. GENERAL:  She is a morbidly obese African American female, in no acute distress, she is nearly recumbent in bed and not short of breath. HEENT:  Normocephalic.  She  has poor dentition. NECK:  Without JVD or bruit. CHEST:  Reveals clear lung fields bilaterally. CARDIAC:  Reveals regular rate and rhythm with a 2/6 systolic murmur at the aortic valve area in left sternal border with preserved S2. ABDOMEN:  Morbidly obese. EXTREMITIES:  Reveal massive edema in both lower extremities and chronic skin changes. NEURO EXAM:  Grossly intact.  She is awake, alert, oriented, and cooperative.  LABORATORY DATA:  Sodium 136, potassium 4.1, BUN 25, and creatinine 1.58.  White count 9.2, hemoglobin 9.7, hematocrit 30.6, platelets 299, INR 1.97.  EKG showed sinus rhythm with voltage criteria for LVH.  Chest x-ray on August 09, 2011, shows cardiomegaly and vascular congestion.  IMPRESSION: 1. Dyspnea on exertion, suspect this is multifactorial, she does not     appear to be in acute heart failure. 2.  Pneumonia. 3. Intermediate ventilation/perfusion scan for pulmonary embolism     which is being treated with Coumadin. 4. Moderate aortic stenosis by echocardiogram with good left     ventricular function. 5. Hypertension and hypertensive cardiovascular disease with diastolic     dysfunction by echocardiogram. 6. Endometrial cancer, status post surgery 2009. 7. Treated dyslipidemia. 8. Renal insufficiency with a creatinine of 1.58. 9. Lymphedema with chronic lower extremity massive edema. 10.Morbid obesity, the patient states she has had 2 sleep studies in     the past and has been told she does not have sleep apnea.  PLAN:  The patient will be seen by Dr. Hazle Coca associate.  Further recommendations to follow.     Abelino Derrick, P.A.   ______________________________ Nanetta Batty, M.D.    Lenard Lance  D:  08/14/2011  T:  08/14/2011  Job:  [id number]  cc:   Nanetta Batty, M.D. Fax: [phone]  Merlene Laughter. Renae Gloss, M.D. Fax: [phone]  Electronically Signed by Corine Shelter P.A. on 08/15/2011 10:11:58 PM Electronically Signed by Nanetta Batty M.D. on 08/17/2011 05:27:22 PM

## 2011-08-18 NOTE — Discharge Summary (Signed)
  NAMEMarland Kitchen  JOHNICE, RIEBE NO.:  [id number]  MEDICAL RECORD NO.:  [id number]  LOCATION:  1416                         FACILITY:  [facility]  PHYSICIAN:  Debbora Presto, MD DATE OF BIRTH:  [birth year]  DATE OF ADMISSION:  08/09/2011 DATE OF DISCHARGE:                              DISCHARGE SUMMARY   ADDENDUM  This is an addendum to discharge summary, job [id number].  Please note following changes:  The patient will discharge home on the medication list provided in previous discharge summary; however, there will be no Lovenox that the patient will go to nursing home with.  Lovenox will be discontinued once the patient is discharged.  The patient will need to get Coumadin 5 mg tablet tonight, August 15, 2011.  Please get 7.5 mg of Coumadin by mouth on Thursday, August 16, 2011.  Please obtain PT/INR on Friday to determine the goals the patient needs to take.  Of note, the patient has completed Lovenox therapy.  Continue only the Coumadin as instructed above.     Debbora Presto, MD     IM/MEDQ  D:  08/15/2011  T:  08/15/2011  Job:  [id number]  Electronically Signed by Debbora Presto MD on 08/18/2011 10:42:48 PM

## 2011-08-27 ENCOUNTER — Ambulatory Visit: Payer: Medicare Other | Admitting: Radiation Oncology

## 2011-08-30 ENCOUNTER — Encounter: Payer: Self-pay | Admitting: *Deleted

## 2011-08-30 DIAGNOSIS — C55 Malignant neoplasm of uterus, part unspecified: Secondary | ICD-10-CM | POA: Insufficient documentation

## 2011-08-30 DIAGNOSIS — G8929 Other chronic pain: Secondary | ICD-10-CM | POA: Insufficient documentation

## 2011-08-30 NOTE — Progress Notes (Signed)
Hx left knee surgery  Radiation therapy Stage IIB carcinosarcoma of the uterus 12/08/08-02/11/09

## 2011-09-03 ENCOUNTER — Ambulatory Visit: Payer: Medicare Other | Admitting: Radiation Oncology

## 2011-09-19 ENCOUNTER — Emergency Department (HOSPITAL_COMMUNITY)
Admission: EM | Admit: 2011-09-19 | Discharge: 2011-09-20 | Disposition: A | Discharge status: Home / Self Care | Payer: Medicare Other | Attending: Emergency Medicine | Admitting: Emergency Medicine

## 2011-09-19 ENCOUNTER — Emergency Department (HOSPITAL_COMMUNITY): Payer: Medicare Other

## 2011-09-19 ENCOUNTER — Encounter (HOSPITAL_COMMUNITY): Payer: Self-pay | Admitting: Emergency Medicine

## 2011-09-19 DIAGNOSIS — R000 Tachycardia, unspecified: Secondary | ICD-10-CM | POA: Insufficient documentation

## 2011-09-19 DIAGNOSIS — R0602 Shortness of breath: Secondary | ICD-10-CM | POA: Insufficient documentation

## 2011-09-19 DIAGNOSIS — Z7901 Long term (current) use of anticoagulants: Secondary | ICD-10-CM | POA: Insufficient documentation

## 2011-09-19 DIAGNOSIS — Z8541 Personal history of malignant neoplasm of cervix uteri: Secondary | ICD-10-CM | POA: Insufficient documentation

## 2011-09-19 DIAGNOSIS — R059 Cough, unspecified: Secondary | ICD-10-CM | POA: Insufficient documentation

## 2011-09-19 DIAGNOSIS — J189 Pneumonia, unspecified organism: Secondary | ICD-10-CM | POA: Insufficient documentation

## 2011-09-19 DIAGNOSIS — R05 Cough: Secondary | ICD-10-CM | POA: Insufficient documentation

## 2011-09-19 DIAGNOSIS — R5381 Other malaise: Secondary | ICD-10-CM | POA: Insufficient documentation

## 2011-09-19 DIAGNOSIS — I10 Essential (primary) hypertension: Secondary | ICD-10-CM | POA: Insufficient documentation

## 2011-09-19 LAB — POCT I-STAT, CHEM 8
BUN: 25 mg/dL — ABNORMAL HIGH (ref 6–23)
Calcium, Ion: 1.09 mmol/L — ABNORMAL LOW (ref 1.12–1.32)
Chloride: 102 mEq/L (ref 96–112)
Creatinine, Ser: 1.4 mg/dL — ABNORMAL HIGH (ref 0.50–1.10)
Glucose, Bld: 137 mg/dL — ABNORMAL HIGH (ref 70–99)
HCT: 37 % (ref 36.0–46.0)
Hemoglobin: 12.6 g/dL (ref 12.0–15.0)
Potassium: 4 mEq/L (ref 3.5–5.1)
Sodium: 136 mEq/L (ref 135–145)
TCO2: 25 mmol/L (ref 0–100)

## 2011-09-19 LAB — DIFFERENTIAL
Eosinophils Absolute: 0.2 10*3/uL (ref 0.0–0.7)
Lymphs Abs: 1.6 10*3/uL (ref 0.7–4.0)
Monocytes Relative: 8 % (ref 3–12)
Neutro Abs: 11.6 10*3/uL — ABNORMAL HIGH (ref 1.7–7.7)
Neutrophils Relative %: 79 % — ABNORMAL HIGH (ref 43–77)

## 2011-09-19 LAB — CBC
Hemoglobin: 11.2 g/dL — ABNORMAL LOW (ref 12.0–15.0)
Platelets: 451 10*3/uL — ABNORMAL HIGH (ref 150–400)
RBC: 3.73 MIL/uL — ABNORMAL LOW (ref 3.87–5.11)
WBC: 14.7 10*3/uL — ABNORMAL HIGH (ref 4.0–10.5)

## 2011-09-19 LAB — PROTIME-INR
INR: 3.56 — ABNORMAL HIGH (ref 0.00–1.49)
Prothrombin Time: 36.1 seconds — ABNORMAL HIGH (ref 11.6–15.2)

## 2011-09-19 MED ORDER — SODIUM CHLORIDE 0.9 % IV SOLN
Freq: Once | INTRAVENOUS | Status: AC
  Administered 2011-09-19: 23:00:00 via INTRAVENOUS

## 2011-09-19 NOTE — ED Notes (Signed)
Pt with admission for PNA in October has had worsening shortness of breath with exertion, "I don't have no energy," and cough for one week. PCP told pt she should come to ED to be evaluated tonight.

## 2011-09-19 NOTE — ED Provider Notes (Signed)
History     CSN: [id number] Arrival date & time: 09/19/2011  6:18 PM   First MD Initiated Contact with Patient 09/19/11 2153      Chief Complaint  Patient presents with  . Cough  . Weakness  . Shortness of Breath    (Consider location/radiation/quality/duration/timing/severity/associated sxs/prior treatment) HPI Comments: Christine [name] is a morbidly obese, African American woman, who had pneumonia and PE with hospitalization  October 25 treated with Avelox.  She was discharged to a rehabilitation facility, where she stayed until November, 13th she's now been staying with her son and having him home.  Rehabilitation 3 times a week on Monday and today.  They noticed that she had a low-grade fever and she gets short of breath with exertion walking from the couch to the bathroom or to her bedroom, which is the extent of her ambulatory abilities.  She did go see Dr. Renae Gloss, today, who was concerned about her increasing shortness of breath, center to the emergency room for further evaluation.  She still currently taking Coumadin.  They increased her dose to 5 mg daily with 7.5 mg twice a week.  She did have her level checked at Dr. Henriette Combs office today, but it is unavailable for review.  She reports no change in appetite, nausea, vomiting, diarrhea, chest pain  Patient is a 77 y.o. female presenting with cough, weakness, and shortness of breath. The history is provided by the patient.  Cough This is a recurrent problem. The current episode started more than 2 days ago. The problem occurs constantly. The problem has been gradually worsening. The cough is non-productive. The maximum temperature recorded prior to her arrival was 101 to 101.9 F. Associated symptoms include shortness of breath. Pertinent negatives include no chest pain, no weight loss, no rhinorrhea and no wheezing. She is not a smoker. Her past medical history is significant for pneumonia.  Weakness The primary symptoms include fever.  Primary symptoms do not include dizziness or nausea.  Additional symptoms include weakness.  Shortness of Breath  Associated symptoms include a fever, cough and shortness of breath. Pertinent negatives include no chest pain, no rhinorrhea and no wheezing.    Past Medical History  Diagnosis Date  . GERD (gastroesophageal reflux disease)   . Hypertension   . Diabetes mellitus     diet controlled  . Morbid obesity   . Arthritis   . Chronic kidney disease     nephrolithiasis left kidney,s/p open resection  . Status post arthroscopic surgery of right knee   . Diverticulosis of sigmoid colon   . Uterine cancer     endometrial high grade adenocarcinoma  . Uterus cancer, sarcoma     high grade invasive carcinosarcoma  . Chronic back pain     Past Surgical History  Procedure Date  . Abdominal hysterectomy     total with b/l salpingo-oopherectomy  . Colonoscopy     negative    Family History  Problem Relation Age of Onset  . Breast cancer Sister     1st sister  . Colon cancer Sister     2nd sister  . Cervical cancer Sister     3rd sister    History  Substance Use Topics  . Smoking status: Never Smoker   . Smokeless tobacco: Not on file  . Alcohol Use: No    OB History    Grav Para Term Preterm Abortions TAB SAB Ect Mult Living   9 5  Review of Systems  Constitutional: Positive for fever. Negative for weight loss, activity change, appetite change and fatigue.  HENT: Negative for rhinorrhea.   Eyes: Negative.   Respiratory: Positive for cough and shortness of breath. Negative for wheezing.   Cardiovascular: Negative for chest pain and leg swelling.  Gastrointestinal: Negative for nausea, diarrhea and constipation.  Genitourinary: Negative.   Musculoskeletal: Negative.   Skin: Negative.   Neurological: Positive for weakness. Negative for dizziness.  Hematological: Negative.   Psychiatric/Behavioral: Negative.     Allergies  Review of patient's  allergies indicates no known allergies.  Home Medications   Current Outpatient Rx  Name Route Sig Dispense Refill  . AMLODIPINE BESYLATE 10 MG PO TABS Oral Take 10 mg by mouth daily.      [name] CLONIDINE HCL 0.2 MG PO TABS Oral Take 0.2 mg by mouth daily.     [name] EZETIMIBE 10 MG PO TABS Oral Take 10 mg by mouth daily.      . GUAIFENESIN 100 MG/5ML PO SOLN Oral Take 5 mLs by mouth every 4 (four) hours as needed. For cough.     [name] HYDROCHLOROTHIAZIDE 25 MG PO TABS Oral Take 25 mg by mouth daily.      [name] HYDROCODONE-ACETAMINOPHEN 10-325 MG PO TABS Oral Take 1 tablet by mouth every 6 (six) hours as needed. For pain.     [name] METOCLOPRAMIDE HCL 10 MG PO TABS Oral Take 10 mg by mouth 4 (four) times daily as needed. For nausea.    [name] METOPROLOL SUCCINATE ER 25 MG PO TB24 Oral Take 25 mg by mouth daily.      [name] VITAMINS A & D EX OINT Topical Apply 1 application topically daily. Applied to chest.     . WARFARIN SODIUM 4 MG PO TABS Oral Take 4-6 mg by mouth daily. 1.5 tab on Mondays and Thursdays, otherwise 1 tablet daily     . ZOLPIDEM TARTRATE 5 MG PO TABS Oral Take 5-10 mg by mouth at bedtime as needed. For sleep.     [name] MOXIFLOXACIN HCL 400 MG PO TABS Oral Take 1 tablet (400 mg total) by mouth daily. 7 tablet 0    BP 119/52  Pulse 95  Temp(Src) 98.9 F (37.2 C) (Oral)  Resp 18  Ht 5\' 7"  (1.702 m)  Wt 339 lb (153.769 kg)  BMI 53.09 kg/m2  SpO2 100%  Physical Exam  Constitutional: She is oriented to person, place, and time. She appears well-developed and well-nourished.  HENT:  Head: Normocephalic.  Eyes: Pupils are equal, round, and reactive to light.  Neck: Normal range of motion.  Cardiovascular: Tachycardia present.   Pulmonary/Chest: No respiratory distress. She has no wheezes. She exhibits no tenderness.  Neurological: She is oriented to person, place, and time.  Skin: Skin is warm and dry.    ED Course  Procedures (including critical care time)  Labs Reviewed  CBC - Abnormal; Notable  for the following:    WBC 14.7 (*)    RBC 3.73 (*)    Hemoglobin 11.2 (*)    HCT 34.4 (*)    Platelets 451 (*)    All other components within normal limits  DIFFERENTIAL - Abnormal; Notable for the following:    Neutrophils Relative 79 (*)    Neutro Abs 11.6 (*)    Lymphocytes Relative 11 (*)    Monocytes Absolute 1.2 (*)    All other components within normal limits  PROTIME-INR - Abnormal; Notable for the following:  Prothrombin Time 36.1 (*)    INR 3.56 (*)    All other components within normal limits  POCT I-STAT, CHEM 8 - Abnormal; Notable for the following:    BUN 25 (*)    Creatinine, Ser 1.40 (*)    Glucose, Bld 137 (*)    Calcium, Ion 1.09 (*)    All other components within normal limits  I-STAT, CHEM 8   Dg Chest 2 View  09/19/2011  *RADIOLOGY REPORT*  Clinical Data: Cough.  CHEST - 2 VIEW  Comparison: Chest x-ray 08/09/2011.  Findings: The heart is borderline enlarged but stable.  The mediastinal and hilar contours are unchanged.  Chronic underlying bronchitic type lung changes with probable superimposed bronchitis or interstitial pneumonitis.  No definite infiltrates.  Moderate elevation of the right hemidiaphragm is noted.  A hiatal hernia is stable.  IMPRESSION: Chronic bronchitic type lung changes with probable superimposed bronchitis or interstitial pneumonitis.  Original Report Authenticated By: P. Loralie Champagne, M.D.     1. Pneumonia     Discussed with DR. Kohut restarting Avelox, discharging patient home with office follow up and continued home rehabilitation   MDM  Will review labs, INR and repeat chest xray         Arman Filter, NP 09/20/11 0125  Arman Filter, NP 09/20/11 5621

## 2011-09-20 ENCOUNTER — Encounter: Payer: Self-pay | Admitting: *Deleted

## 2011-09-20 DIAGNOSIS — J189 Pneumonia, unspecified organism: Secondary | ICD-10-CM | POA: Insufficient documentation

## 2011-09-20 DIAGNOSIS — N184 Chronic kidney disease, stage 4 (severe): Secondary | ICD-10-CM | POA: Insufficient documentation

## 2011-09-20 DIAGNOSIS — N189 Chronic kidney disease, unspecified: Secondary | ICD-10-CM

## 2011-09-20 DIAGNOSIS — M1990 Unspecified osteoarthritis, unspecified site: Secondary | ICD-10-CM

## 2011-09-20 DIAGNOSIS — K219 Gastro-esophageal reflux disease without esophagitis: Secondary | ICD-10-CM

## 2011-09-20 DIAGNOSIS — I2699 Other pulmonary embolism without acute cor pulmonale: Secondary | ICD-10-CM | POA: Insufficient documentation

## 2011-09-20 DIAGNOSIS — E109 Type 1 diabetes mellitus without complications: Secondary | ICD-10-CM

## 2011-09-20 DIAGNOSIS — N182 Chronic kidney disease, stage 2 (mild): Secondary | ICD-10-CM

## 2011-09-20 DIAGNOSIS — I10 Essential (primary) hypertension: Secondary | ICD-10-CM

## 2011-09-20 HISTORY — DX: Chronic kidney disease, stage 2 (mild): N18.2

## 2011-09-20 MED ORDER — MOXIFLOXACIN HCL IN NACL 400 MG/250ML IV SOLN
400.0000 mg | Freq: Once | INTRAVENOUS | Status: AC
  Administered 2011-09-20: 400 mg via INTRAVENOUS
  Filled 2011-09-20: qty 250

## 2011-09-20 MED ORDER — MOXIFLOXACIN HCL 400 MG PO TABS: 400.0000 mg | ORAL_TABLET | Freq: Every day | ORAL | Status: DC

## 2011-09-21 ENCOUNTER — Encounter: Payer: Self-pay | Admitting: *Deleted

## 2011-09-21 ENCOUNTER — Encounter: Payer: Self-pay | Admitting: Radiation Oncology

## 2011-09-21 NOTE — Progress Notes (Signed)
Pt here as follow up withUterine Cancer and  radiation therapy 12/08/08-02/11/09 Recent hospital in the Ed 09/19/11 with shortness breath, fever,past pneumonia in October with P.E., pt given Ivf's 1 Liter, Iv avelox and rx for po Avelox, Impression:  Chronic bronshitic type lung changes w/ probable superimposed bronochitis or interstitial pneumonia from cxray, pt  d/c home with son with Rehab 3x week left nursing facility in Nov. Taking coumadin per protocol

## 2011-09-24 ENCOUNTER — Other Ambulatory Visit (HOSPITAL_COMMUNITY)
Admission: RE | Admit: 2011-09-24 | Discharge: 2011-09-24 | Disposition: A | Discharge status: Home / Self Care | Payer: Medicare Other | Source: Ambulatory Visit | Attending: Radiation Oncology | Admitting: Radiation Oncology

## 2011-09-24 ENCOUNTER — Encounter: Payer: Self-pay | Admitting: Radiation Oncology

## 2011-09-24 ENCOUNTER — Ambulatory Visit
Admission: RE | Admit: 2011-09-24 | Discharge: 2011-09-24 | Disposition: A | Discharge status: Home / Self Care | Payer: Medicare Other | Source: Ambulatory Visit | Attending: Radiation Oncology | Admitting: Radiation Oncology

## 2011-09-24 VITALS — BP 106/72 | HR 93 | Temp 98.8°F | Resp 22 | Wt 358.0 lb

## 2011-09-24 DIAGNOSIS — C55 Malignant neoplasm of uterus, part unspecified: Secondary | ICD-10-CM

## 2011-09-24 DIAGNOSIS — Z01419 Encounter for gynecological examination (general) (routine) without abnormal findings: Secondary | ICD-10-CM | POA: Insufficient documentation

## 2011-09-24 NOTE — Progress Notes (Signed)
Son brought pt down in w/c for follow up, no c/o pain, still coughs up yellow sputum, but better with taking avelox daily from d/c hospital 09/19/11, room air sats=94%, no bleeding or discharge stated, uses her dilator sometimes stated 1:17 PM

## 2011-09-24 NOTE — Progress Notes (Signed)
Pp smear collected by Dr. Roselind Messier, sent to lab,pt tolerated well 6 month f/u appt card given to pt 2:14 PM

## 2011-09-24 NOTE — Progress Notes (Signed)
CC:   Christine A. Duard Brady, MD Christine Colonel, MD  DIAGNOSIS:  Carcinosarcoma of the uterus.  INTERVAL SINCE RADIATION THERAPY:  2-1/2 years.  NARRATIVE:  Christine [name] comes in today for routine followup.  She clinically seems to be doing reasonably well.  The patient does have chronic low back pain and was being evaluated for surgery.  However, given the substantial risk for this procedure, the patient elected not to proceed with planned surgery.  In addition, the patient has had pneumonia recently, has completed a second round of antibiotics.  The patient is on Avelox for this issue.  The patient denies any vaginal bleeding, urination difficulties or rectal bleeding.  PHYSICAL EXAMINATION:  Vital signs:  Patient's weight is 358 pounds. Temperature is 98.8,  pulse is 93, blood pressure is 106/72.  Oxygen saturation is 94% on room air.  Examination of the neck and supraclavicular region reveals no obvious adenopathy.  Lungs:  Reveals some crackles in the left base.  Heart:  Has a regular rhythm and rate. Abdomen:  Reveals it to be soft and nontender with normal bowel sounds. There is no obvious hepatosplenomegaly.  The inguinal areas appear to be free of adenopathy, however, exam is compromised.  The patient proceeded to undergo a pelvic examination.  There are no obvious lesions noted along the external genitalia.  A large speculum was used for the examination.  The vaginal cuff shows some radiation changes but no obvious signs of recurrence.  A Pap smear was obtained at the proximal vagina.  On bimanual and rectovaginal examination, there are no obvious pelvic masses although exam is somewhat compromised in light of the patient's body habitus.  IMPRESSION AND PLAN:  Clinically no evidence of disease, Pap smear pending.  The patient will return for followup in 6 months and, in the interim, will see Dr. Duard [name].    ______________________________ Christine [name], Ph.D., M.D. JDK/MEDQ   D:  09/24/2011  T:  09/24/2011  Job:  [id number]

## 2011-09-26 NOTE — ED Provider Notes (Signed)
Medical screening examination/treatment/procedure(s) were conducted as a shared visit with non-physician practitioner(s) and myself.  I personally evaluated the patient during the encounter.  Pt with some dyspnea. 99-100% on RA and no respiratory distress on exam. Suspect anxiety component. Possibel infectious and dose have leukocytosis. Afebrile. Will tx with course of abx. Discussed strict return precautions.  Raeford Razor, MD 09/26/11 [phone]

## 2011-09-29 ENCOUNTER — Encounter (HOSPITAL_COMMUNITY): Payer: Self-pay | Admitting: *Deleted

## 2011-09-29 ENCOUNTER — Other Ambulatory Visit: Payer: Self-pay

## 2011-09-29 ENCOUNTER — Emergency Department (HOSPITAL_COMMUNITY): Payer: Medicare Other

## 2011-09-29 ENCOUNTER — Inpatient Hospital Stay (HOSPITAL_COMMUNITY)
Admission: EM | Admit: 2011-09-29 | Discharge: 2011-10-06 | DRG: 291 | Disposition: A | Discharge status: Home Health | Payer: Medicare Other | Attending: Internal Medicine | Admitting: Internal Medicine

## 2011-09-29 DIAGNOSIS — I5033 Acute on chronic diastolic (congestive) heart failure: Principal | ICD-10-CM | POA: Diagnosis present

## 2011-09-29 DIAGNOSIS — M109 Gout, unspecified: Secondary | ICD-10-CM | POA: Diagnosis not present

## 2011-09-29 DIAGNOSIS — I10 Essential (primary) hypertension: Secondary | ICD-10-CM | POA: Diagnosis present

## 2011-09-29 DIAGNOSIS — I2699 Other pulmonary embolism without acute cor pulmonale: Secondary | ICD-10-CM | POA: Diagnosis present

## 2011-09-29 DIAGNOSIS — D72829 Elevated white blood cell count, unspecified: Secondary | ICD-10-CM | POA: Diagnosis present

## 2011-09-29 DIAGNOSIS — R0602 Shortness of breath: Secondary | ICD-10-CM

## 2011-09-29 DIAGNOSIS — I129 Hypertensive chronic kidney disease with stage 1 through stage 4 chronic kidney disease, or unspecified chronic kidney disease: Secondary | ICD-10-CM | POA: Diagnosis present

## 2011-09-29 DIAGNOSIS — Z6841 Body Mass Index (BMI) 40.0 and over, adult: Secondary | ICD-10-CM

## 2011-09-29 DIAGNOSIS — E119 Type 2 diabetes mellitus without complications: Secondary | ICD-10-CM | POA: Diagnosis present

## 2011-09-29 DIAGNOSIS — IMO0001 Reserved for inherently not codable concepts without codable children: Secondary | ICD-10-CM

## 2011-09-29 DIAGNOSIS — I5031 Acute diastolic (congestive) heart failure: Secondary | ICD-10-CM

## 2011-09-29 DIAGNOSIS — N184 Chronic kidney disease, stage 4 (severe): Secondary | ICD-10-CM | POA: Diagnosis present

## 2011-09-29 DIAGNOSIS — I509 Heart failure, unspecified: Secondary | ICD-10-CM | POA: Diagnosis present

## 2011-09-29 DIAGNOSIS — Z7901 Long term (current) use of anticoagulants: Secondary | ICD-10-CM

## 2011-09-29 DIAGNOSIS — R5381 Other malaise: Secondary | ICD-10-CM | POA: Diagnosis present

## 2011-09-29 DIAGNOSIS — M1990 Unspecified osteoarthritis, unspecified site: Secondary | ICD-10-CM | POA: Diagnosis present

## 2011-09-29 DIAGNOSIS — N182 Chronic kidney disease, stage 2 (mild): Secondary | ICD-10-CM | POA: Diagnosis present

## 2011-09-29 DIAGNOSIS — I359 Nonrheumatic aortic valve disorder, unspecified: Secondary | ICD-10-CM | POA: Diagnosis present

## 2011-09-29 DIAGNOSIS — K219 Gastro-esophageal reflux disease without esophagitis: Secondary | ICD-10-CM | POA: Diagnosis present

## 2011-09-29 DIAGNOSIS — M129 Arthropathy, unspecified: Secondary | ICD-10-CM | POA: Diagnosis present

## 2011-09-29 DIAGNOSIS — D689 Coagulation defect, unspecified: Secondary | ICD-10-CM | POA: Diagnosis present

## 2011-09-29 DIAGNOSIS — I890 Lymphedema, not elsewhere classified: Secondary | ICD-10-CM | POA: Diagnosis present

## 2011-09-29 HISTORY — DX: Lymphedema, not elsewhere classified: I89.0

## 2011-09-29 HISTORY — DX: Acute diastolic (congestive) heart failure: I50.31

## 2011-09-29 LAB — PRO B NATRIURETIC PEPTIDE: Pro B Natriuretic peptide (BNP): 188.6 pg/mL (ref 0–450)

## 2011-09-29 LAB — CBC
Platelets: 379 10*3/uL (ref 150–400)
RBC: 3.49 MIL/uL — ABNORMAL LOW (ref 3.87–5.11)
WBC: 14.3 10*3/uL — ABNORMAL HIGH (ref 4.0–10.5)

## 2011-09-29 LAB — POCT I-STAT, CHEM 8
Creatinine, Ser: 1.4 mg/dL — ABNORMAL HIGH (ref 0.50–1.10)
Hemoglobin: 11.2 g/dL — ABNORMAL LOW (ref 12.0–15.0)
Sodium: 139 mEq/L (ref 135–145)
TCO2: 26 mmol/L (ref 0–100)

## 2011-09-29 LAB — DIFFERENTIAL
Eosinophils Absolute: 0.3 10*3/uL (ref 0.0–0.7)
Lymphocytes Relative: 14 % (ref 12–46)
Lymphs Abs: 2 10*3/uL (ref 0.7–4.0)
Neutro Abs: 10.7 10*3/uL — ABNORMAL HIGH (ref 1.7–7.7)
Neutrophils Relative %: 75 % (ref 43–77)

## 2011-09-29 LAB — TROPONIN I: Troponin I: <0.3 ng/mL (ref <0.30)

## 2011-09-29 MED ORDER — SODIUM CHLORIDE 0.9 % IJ SOLN
3.0000 mL | INTRAMUSCULAR | Status: DC | PRN
  Administered 2011-09-30: 3 mL via INTRAVENOUS

## 2011-09-29 MED ORDER — LIDOCAINE HCL 1 % IJ SOLN
INTRAMUSCULAR | Status: AC
  Administered 2011-09-29: 18:00:00
  Filled 2011-09-29: qty 20

## 2011-09-29 MED ORDER — ONDANSETRON HCL 4 MG PO TABS: 4.0000 mg | ORAL_TABLET | Freq: Four times a day (QID) | ORAL | Status: DC | PRN

## 2011-09-29 MED ORDER — FUROSEMIDE 40 MG PO TABS
40.0000 mg | ORAL_TABLET | Freq: Once | ORAL | Status: AC
  Administered 2011-09-29: 40 mg via ORAL
  Filled 2011-09-29: qty 1

## 2011-09-29 MED ORDER — ACETAMINOPHEN 325 MG PO TABS: 650.0000 mg | ORAL_TABLET | Freq: Four times a day (QID) | ORAL | Status: DC | PRN

## 2011-09-29 MED ORDER — SODIUM CHLORIDE 0.9 % IJ SOLN
3.0000 mL | Freq: Two times a day (BID) | INTRAMUSCULAR | Status: DC
  Administered 2011-09-29 – 2011-10-05 (×13): 3 mL via INTRAVENOUS

## 2011-09-29 MED ORDER — SODIUM CHLORIDE 0.9 % IV SOLN: 250.0000 mL | INTRAVENOUS | Status: DC | PRN

## 2011-09-29 MED ORDER — LIVING BETTER WITH HEART FAILURE BOOK
Freq: Once | Status: AC
  Administered 2011-09-29: 20:00:00
  Filled 2011-09-29: qty 1

## 2011-09-29 MED ORDER — OXYCODONE HCL 5 MG PO TABS
5.0000 mg | ORAL_TABLET | ORAL | Status: DC | PRN
  Administered 2011-09-30 – 2011-10-06 (×8): 5 mg via ORAL
  Filled 2011-09-29 (×8): qty 1

## 2011-09-29 MED ORDER — ACETAMINOPHEN 650 MG RE SUPP: 650.0000 mg | Freq: Four times a day (QID) | RECTAL | Status: DC | PRN

## 2011-09-29 MED ORDER — ONDANSETRON HCL 4 MG/2ML IJ SOLN
4.0000 mg | Freq: Four times a day (QID) | INTRAMUSCULAR | Status: DC | PRN
  Administered 2011-09-30: 4 mg via INTRAVENOUS
  Filled 2011-09-29: qty 2

## 2011-09-29 NOTE — ED Provider Notes (Signed)
History     CSN: [id number] Arrival date & time: 09/29/2011 10:06 AM   First MD Initiated Contact with Patient 09/29/11 1019      No chief complaint on file.   (Consider location/radiation/quality/duration/timing/severity/associated sxs/prior treatment) Patient is a 77 y.o. female presenting with shortness of breath. The history is provided by the patient and a relative.  Shortness of Breath  Associated symptoms include cough and shortness of breath. Pertinent negatives include no chest pain and no fever.  patient's had shortness of breath on and off since October. In October she was diagnosed with pneumonia and intermediate probability for PE.since then she's been in our hospital and nursing home. She was seen in the ER about 10 days ago and was diagnosed with pneumonia again and started on antibiotics. She states that over the last week she's had increasing fatigue and shortness of breath. She's had a cough with some yellow sputum. No fevers. Her shortness of breath is worse with exertion. She feels weak overall. She states she sometimes coughs so bad that she throws up. No chest pain.  Past Medical History  Diagnosis Date  . GERD (gastroesophageal reflux disease)   . Hypertension   . Diabetes mellitus     diet controlled  . Morbid obesity   . Arthritis   . Chronic kidney disease     nephrolithiasis left kidney,s/p open resection  . Status post arthroscopic surgery of right knee   . Diverticulosis of sigmoid colon   . Chronic back pain   . Pneumonia 07/2011    hx  . Pulmonary embolism 07/2011    hx  . Uterine cancer     endometrial high grade adenocarcinoma  . Uterus cancer, sarcoma     high grade invasive carcinosarcoma    Past Surgical History  Procedure Date  . Abdominal hysterectomy     total with b/l salpingo-oopherectomy  . Colonoscopy     negative  . Knee arthroscopy     right knee    Family History  Problem Relation Age of Onset  . Breast cancer Sister       1st sister  . Colon cancer Sister     2nd sister  . Cervical cancer Sister     3rd sister    History  Substance Use Topics  . Smoking status: Never Smoker   . Smokeless tobacco: Not on file  . Alcohol Use: No    OB History    Grav Para Term Preterm Abortions TAB SAB Ect Mult Living   9 5              Review of Systems  Constitutional: Positive for fatigue. Negative for fever, activity change and appetite change.  HENT: Negative for neck stiffness.   Eyes: Negative for pain.  Respiratory: Positive for cough and shortness of breath. Negative for chest tightness.   Cardiovascular: Negative for chest pain and leg swelling.  Gastrointestinal: Positive for nausea. Negative for vomiting, abdominal pain and diarrhea.  Genitourinary: Negative for flank pain.  Musculoskeletal: Negative for back pain.  Skin: Negative for rash.  Neurological: Negative for weakness, numbness and headaches.  Psychiatric/Behavioral: Negative for behavioral problems.    Allergies  Review of patient's allergies indicates no known allergies.  Home Medications   Current Outpatient Rx  Name Route Sig Dispense Refill  . AMLODIPINE BESYLATE 10 MG PO TABS Oral Take 10 mg by mouth daily.      [name] CLONIDINE HCL 0.2 MG PO TABS Oral Take 0.2  mg by mouth daily.     . CORICIDIN HBP CONGESTION/COUGH PO Oral Take 1 tablet by mouth 2 (two) times daily.      [name] EZETIMIBE 10 MG PO TABS Oral Take 10 mg by mouth daily.     [name] HYDROCHLOROTHIAZIDE 25 MG PO TABS Oral Take 25 mg by mouth daily.     [name] HYDROCODONE-ACETAMINOPHEN 10-325 MG PO TABS Oral Take 1 tablet by mouth every 6 (six) hours as needed. For pain.     [name] METOCLOPRAMIDE HCL 10 MG PO TABS Oral Take 10 mg by mouth 4 (four) times daily as needed. For nausea. Patient states that she takes once a day as needed.    [name] METOPROLOL SUCCINATE ER 25 MG PO TB24 Oral Take 25 mg by mouth daily.      [name] VITAMINS A & D EX OINT Topical Apply 1 application topically daily. Applied  to chest.     . WARFARIN SODIUM 4 MG PO TABS Oral Take 4-6 mg by mouth daily. 1.5 tabs Monday,wed, and Friday, rest days 1 tab, 4mg  stytarting today 09/24/11    . ZOLPIDEM TARTRATE 5 MG PO TABS Oral Take 5-10 mg by mouth at bedtime as needed. For sleep.       BP 141/55  Pulse 99  Temp(Src) 99 F (37.2 C) (Oral)  Resp 21  SpO2 100%  Physical Exam  Nursing note and vitals reviewed. Constitutional: She is oriented to person, place, and time. She appears well-developed and well-nourished.       Patient is obese.  HENT:  Head: Normocephalic and atraumatic.  Eyes: EOM are normal. Pupils are equal, round, and reactive to light.  Neck: Normal range of motion. Neck supple.  Cardiovascular: Regular rhythm and normal heart sounds.   No murmur heard.      tachycardia  Pulmonary/Chest: Effort normal and breath sounds normal. No respiratory distress. She has no wheezes. She has no rales.       Mildly prolonged expirations and a few scattered rails. Difficult exam due to body habitus  Abdominal: Soft. Bowel sounds are normal. She exhibits no distension. There is no tenderness. There is no rebound and no guarding.  Musculoskeletal: Normal range of motion.       Chronic changes at bilateral lower extremities.  Neurological: She is alert and oriented to person, place, and time. No cranial nerve deficit.  Skin: Skin is warm and dry.  Psychiatric: She has a normal mood and affect. Her speech is normal.    ED Course  Procedures (including critical care time)  Labs Reviewed  CBC - Abnormal; Notable for the following:    WBC 14.3 (*)    RBC 3.49 (*)    Hemoglobin 10.5 (*)    HCT 32.2 (*)    All other components within normal limits  DIFFERENTIAL - Abnormal; Notable for the following:    Neutro Abs 10.7 (*)    Monocytes Absolute 1.2 (*)    All other components within normal limits  PROTIME-INR - Abnormal; Notable for the following:    Prothrombin Time 50.7 (*)    INR 5.49 (*)    All other  components within normal limits  POCT I-STAT, CHEM 8 - Abnormal; Notable for the following:    BUN 24 (*)    Creatinine, Ser 1.40 (*)    Glucose, Bld 170 (*)    Hemoglobin 11.2 (*)    HCT 33.0 (*)    All other components within normal limits  PRO B  NATRIURETIC PEPTIDE  TROPONIN I  I-STAT, CHEM 8   Dg Chest 2 View  09/29/2011  *RADIOLOGY REPORT*  Clinical Data: Shortness of breath.  CHEST - 2 VIEW  Comparison: PA and lateral chest 09/19/2011.  Findings: Right pleural effusions seen on the comparison study appears decreased in size.  There is associated compressive atelectasis in the right base.  Cardiomegaly and pulmonary vascular congestion are noted.  IMPRESSION:  1.  Decreased right pleural effusion. 2.  Cardiomegaly vascular congestion.  Original Report Authenticated By: Bernadene Bell. D'ALESSIO, M.D.     1. Shortness of breath     Date: 09/29/2011  Rate: 97  Rhythm: normal sinus rhythm  QRS Axis: normal  Intervals: normal  ST/T Wave abnormalities: normal  Conduction Disutrbances:none  Narrative Interpretation: LVH  Old EKG Reviewed: unchanged     MDM  The patient presented shortness of breath. She has been hospitalized for pneumonia and possibly PE for similar symptoms. She's been recently on antibiotics for the same thing. She states she has had increased trouble breathing. Lab work is overall reassuring. Her INR is elevated. She states she cannot manage at home. She's not appear to have clear pneumonia at this time. She'll be admitted to the hospital.        Juliet Rude. Rubin Payor, MD 09/29/11 [phone]

## 2011-09-29 NOTE — ED Notes (Addendum)
Spoke with lab, pt's INR is 5.49

## 2011-09-29 NOTE — Progress Notes (Signed)
ANTICOAGULATION CONSULT NOTE - Initial Consult  Pharmacy Consult for Warfarin Indication: VTE prophylaxis  No Known Allergies  Patient Measurements:     Vital Signs: Temp: 98.2 F (36.8 C) (12/15 1753) Temp src: Oral (12/15 1753) BP: 130/52 mmHg (12/15 1753) Pulse Rate: 91  (12/15 1753)  Labs:  Basename 09/29/11 1044 09/29/11 1027  HGB 11.2* 10.5*  HCT 33.0* 32.2*  PLT -- 379  APTT -- --  LABPROT -- 50.7*  INR -- 5.49*  HEPARINUNFRC -- --  CREATININE 1.40* --  CKTOTAL -- --  CKMB -- --  TROPONINI -- <0.30   The CrCl is unknown because both a height and weight (above a minimum accepted value) are required for this calculation.  Medical History: Past Medical History  Diagnosis Date  . GERD (gastroesophageal reflux disease)   . Hypertension   . Diabetes mellitus     diet controlled  . Morbid obesity   . Arthritis   . Chronic kidney disease     nephrolithiasis left kidney,s/p open resection  . Status post arthroscopic surgery of right knee   . Diverticulosis of sigmoid colon   . Chronic back pain   . Pneumonia 07/2011    hx  . Pulmonary embolism 07/2011    hx  . Uterine cancer     endometrial high grade adenocarcinoma  . Uterus cancer, sarcoma     high grade invasive carcinosarcoma    Medications:  Scheduled:    . a stronger pump book   Does not apply Once  . furosemide  40 mg Oral Once  . lidocaine      . sodium chloride  3 mL Intravenous Q12H    Assessment: 80YOF on coumadin PTA for VTE prophylaxis following PE in 07/2011. Therapy is complicated by patient's coagulopathy most likely 2/2 liver dysfunction/congestion.  Patient's home warfarin schedule is 4mg  daily except 6mg  on MWF.  INR currently supratherapeutic on this home schedule at 5.49 today. No bleeding/complications reported.  Goal of Therapy:  INR 2-3   Plan:  No coumadin dose tonight.  Will order daily PT/INR.  Clance Boll 09/29/2011,8:06 PM

## 2011-09-29 NOTE — ED Notes (Signed)
Pt transferred to TCU. Alert and oriented and in no acute distress at this time.  14 french  Foley catheter inserted. Clear pale yellow urine noted. Tolerated insertion.

## 2011-09-29 NOTE — ED Notes (Addendum)
Pt resting comfortably. Pt requested a foley catheter, RN Julieta Bellini was notified.

## 2011-09-29 NOTE — H&P (Signed)
PCP:   Alva Garnet., MD   Chief Complaint:  Weakness and shortness of breath  HPI: Patient is a 77 year old Afro-American female past medical history of diastolic heart failure, pulmonary embolism diagnosed in October of this year, lymphedema and hypertension who comes in complaining of shortness of breath and weakness. The patient was under the hospitalist service approximately a little under 2 months ago and at that time was diagnosed with a pulmonary embolus of likely probability. In her workup, she had an echocardiogram done which noted grade 1 diastolic heart failure. Patient was discharged to a short-term skilled nursing facility and from there went home. Since that time she has had episodes of shortness of breath and generalized weakness. She she came into the ER 10 days ago for evaluation. At that time she was noted to have a leukocytosis with a white count of 13. Her INR was minimally elevated above normal. Her oxygen saturations and other workup was fine, and it was felt that likely this was some anxiety component along with deconditioning and her leukocytosis may be from her bronchitis. She was given antibiotics and sent back home.  Since that time, patient has not improved. She still complains of shortness of breath, worse when she says she exerts herself. She denies any chest pain. Overall she says she feels quite weak and run down and cannot get around very much now. She came in with her son today who she lives with for further evaluation and treatment. On lab work she was noted to have a much elevated INR at 5.6 and a chest x-ray denoting cardiomegaly and vascular congestion but decreased right pleural effusion. The rest of her labs noted a chronic stable kidney disease with creatinine at 1.4.  It was noted her oxygen saturations were stable from 96-98% on room air. Hospitals were called for further evaluation.  Review of Systems:  T saw the patient, she was doing fair. She said she  felt very weak. She denies any headaches, vision changes, dysphasia, chest pain or palpitations. She did to plan of chronic shortness of breath more so now. She complained of a cough, mostly nonproductive but occasionally yellowish sputum. She described her shortness of breath is ongoing but significantly worse whenever she was trying to walk around or exert herself. She denied any orthopnea. Denied any wheezing, abdominal pain, hematuria, dysuria, constipation, diarrhea. She complained chronically swollen legs secondary to lymphedema. She died in the bleeding-active, black or bloody stools. Review systems otherwise negative.  Past Medical History: Past Medical History  Diagnosis Date  . GERD (gastroesophageal reflux disease)   . Hypertension   . Diabetes mellitus     diet controlled  . Morbid obesity   . Arthritis   . Chronic kidney disease     nephrolithiasis left kidney,s/p open resection  . Status post arthroscopic surgery of right knee   . Diverticulosis of sigmoid colon   . Chronic back pain   . Pneumonia 07/2011    hx  . Pulmonary embolism 07/2011    hx  . Uterine cancer     endometrial high grade adenocarcinoma  . Uterus cancer, sarcoma     high grade invasive carcinosarcoma   Past Surgical History  Procedure Date  . Abdominal hysterectomy     total with b/l salpingo-oopherectomy  . Colonoscopy     negative  . Knee arthroscopy     right knee    Medications: Prior to Admission medications   Medication Sig Start Date End Date Taking?  Authorizing Provider  amLODipine (NORVASC) 10 MG tablet Take 10 mg by mouth daily.     Yes Historical Provider, MD  cloNIDine (CATAPRES) 0.2 MG tablet Take 0.2 mg by mouth daily.    Yes Historical Provider, MD  Dextromethorphan-Guaifenesin (CORICIDIN HBP CONGESTION/COUGH PO) Take 1 tablet by mouth 2 (two) times daily.     Yes Historical Provider, MD  ezetimibe (ZETIA) 10 MG tablet Take 10 mg by mouth daily.    Yes Historical Provider, MD    hydrochlorothiazide (HYDRODIURIL) 25 MG tablet Take 25 mg by mouth daily.    Yes Historical Provider, MD  HYDROcodone-acetaminophen (NORCO) 10-325 MG per tablet Take 1 tablet by mouth every 6 (six) hours as needed. For pain.    Yes Historical Provider, MD  metoCLOPramide (REGLAN) 10 MG tablet Take 10 mg by mouth 4 (four) times daily as needed. For nausea. Patient states that she takes once a day as needed.   Yes Historical Provider, MD  metoprolol succinate (TOPROL-XL) 25 MG 24 hr tablet Take 25 mg by mouth daily.     Yes Historical Provider, MD  Vitamins A & D (VITAMIN A & D) ointment Apply 1 application topically daily. Applied to chest.    Yes Historical Provider, MD  warfarin (COUMADIN) 4 MG tablet Take 4-6 mg by mouth daily. 1.5 tabs Monday,wed, and Friday, rest days 1 tab, 4mg  stytarting today 09/24/11   Yes Historical Provider, MD  zolpidem (AMBIEN) 5 MG tablet Take 5-10 mg by mouth at bedtime as needed. For sleep.    Yes Historical Provider, MD    Allergies:  No Known Allergies  Social History:  reports that she has never smoked. She does not have any smokeless tobacco history on file. She reports that she does not drink alcohol or use illicit drugs. the patient lives at home with her son and his family. She does ambulate but says that she is having a harder time because of shortness of breath. She normally does not use a walker.  Family History: Family History  Problem Relation Age of Onset  . Breast cancer Sister     1st sister  . Colon cancer Sister     2nd sister  . Cervical cancer Sister     3rd sister    Physical Exam: Filed Vitals:   09/29/11 1009 09/29/11 1052  BP: 141/55   Pulse: 99   Temp: 99 F (37.2 C)   TempSrc: Oral   Resp: 21   SpO2: 97% 100%   General: Alert and oriented x3, no acute distress while laying still. HEENT: Normocephalic, atraumatic, mucous membranes are moist. Cardiovascular: Regular rate and rhythm, S1-S2, soft. Lungs: Decreased breath  sounds throughout secondary to body habitus. Some rales at the right base. Abdomen: Soft, obese, nontender, hypoactive bowel sounds Extremities: Patient has signs of chronic lymphedema, 2+ pitting from the knees down, poor peripheral pulses.   Labs on Admission:   [facility] 09/29/11 1044  NA 139  K 3.7  CL 103  CO2 --  GLUCOSE 170*  BUN 24*  CREATININE 1.40*  CALCIUM --  MG --  PHOS --    Basename 09/29/11 1044 09/29/11 1027  WBC -- 14.3*  NEUTROABS -- 10.7*  HGB 11.2* 10.5*  HCT 33.0* 32.2*  MCV -- 92.3  PLT -- 379    Basename 09/29/11 1027  CKTOTAL --  CKMB --  CKMBINDEX --  TROPONINI <0.30   Radiological Exams on Admission: Dg Chest 2 View 09/29/2011  *IMPRESSION:  1.  Decreased right pleural effusion. 2.  Cardiomegaly vascular congestion.   Comparison to x-ray done on 09/19/2011  IMPRESSION: Chronic bronchitic type lung changes with probable superimposed bronchitis or interstitial pneumonitis.  Original Report Authenticated By: P. Loralie Champagne, M.D.    Assessment/Plan Present on Admission:  .Chronic kidney disease, stage II (mild): Stable.  .Pulmonary embolism: Do not suspect that her shortness of breath is from worsening pulmonary embolus or new, given her elevated INR. We'll have pharmacy dose Coumadin. See below for coagulopathy.  .Morbid obesity  .Arthritis: Stable. Contributing factor are obesity.  Marland KitchenGERD (gastroesophageal reflux disease): Stable continue PPI  .Leukocytosis: I suspect this is stress margination brought on by volume overload. No signs of any acute infection and she has been on by mouth antibiotics prescribed 10 days ago by the emergency room.  Marland KitchenHypertension: Continue home meds.  .Coagulopathy: This is likely from hepatic congestion causing decreased clearance of her Coumadin which is from her acute diastolic heart failure. I suspect another restart a diuretic, her INR should come down nicely. Pharmacy is helping today's  Coumadin.  .Acute diastolic congestive heart failure: This is likely the principal problem which is causing her shortness of breath without decreased oxygen saturations, her generalized weakness and her coagulopathy. Have started low-dose diuretic at 40 mg of Lasix twice a day. Note that she was not on any diuretics prior to this. Also provided CHF education. She sees Dr. Allyson Sabal of [facility] cardiology.  Will notify them as to her admission for any further recommendations. Monitor input and output and daily weights. Have PT OT  to see if she needs to go home with home health versus short-term rehabilitation for deconditioning.  Lymphedema: Chronic, although this should improve as we diurese some of this fluid off of her.  Christine [name] K 09/29/2011, 2:56 PM

## 2011-09-29 NOTE — ED Notes (Signed)
Pt states she was dx with PNA @ 2 weeks ago and hospitalized for several days.  Pt was also dx with PNA in October.  Pt states this past week she has begun to feel weak and has had episodes of SOB - worse with exertion.  Pt also c/o cough that is productive of yellow sputum.  Pt states she sometimes coughs so violently she wretches.

## 2011-09-29 NOTE — ED Notes (Signed)
Pt states she has been having SOB with exertion since dx with PNA in October of last year.  She feel she has not gotten any better. She has been seen multiple times for it since, with no real improvement.  She came in today, because it seems to be getting worse.

## 2011-09-29 NOTE — ED Notes (Signed)
Patient transported to X-ray 

## 2011-09-29 NOTE — Consult Note (Signed)
[facility]       CONSULTATION NOTE   Reason for Consult: Dyspnea, [place] Heart Association functional class 3-4  Requesting Physician: Virginia Rochester, MD  HPI: This is a 77 y.o. female with a past medical history significant for Recent pneumonia and pulmonary embolism, mild to moderate aortic stenosis, diastolic heart failure, systemic hypertension, hyperlipidemia, long-standing lymphedema of the lower extremities, previous uterine cancer and diet-controlled diabetes mellitus.  She presents today the hospital with complaints of severe dyspnea with minimal exertion and even mild dyspnea at rest.She does not describe clear-cut orthopnea and has not experienced paroxysmal nocturnal dyspnea. She has some mild left-sided posterior chest pain that sounds more pleuritic and is not a big complaints at this time. She has had a cough productive of thin yellowish sputum patient denies any wheezing. She is not on the habit of weighing herself daily and has chronic severe edema. She notes that her left calf and knee are generally more swollen than the right. There doesn't seem to be much of a change from her chronic edema.  She has recently been in a skilled nursing facility for short period of time following her admission for pneumonia and pulmonary embolism. It sounds like she never really got better.  She is noted to be supratherapeutically anticoagulated with an INR of 5.6. Her physician had recently increased her dose of warfarin reportedly for a subtherapeutic INR. She has persistent low-grade leukocytosis. There is no evidence of hypoxemia and she does not appear toxic or hemodynamically stable  PMHx:  Past Medical History  Diagnosis Date  . GERD (gastroesophageal reflux disease)   . Hypertension   . Diabetes mellitus     diet controlled  . Morbid obesity   . Arthritis   . Chronic kidney disease     nephrolithiasis left kidney,s/p open resection  . Status  post arthroscopic surgery of right knee   . Diverticulosis of sigmoid colon   . Chronic back pain   . Pneumonia 07/2011    hx  . Pulmonary embolism 07/2011    hx  . Uterine cancer     endometrial high grade adenocarcinoma  . Uterus cancer, sarcoma     high grade invasive carcinosarcoma   Past Surgical History  Procedure Date  . Abdominal hysterectomy     total with b/l salpingo-oopherectomy  . Colonoscopy     negative  . Knee arthroscopy     right knee    FAMHx: Family History  Problem Relation Age of Onset  . Breast cancer Sister     1st sister  . Colon cancer Sister     2nd sister  . Cervical cancer Sister     3rd sister    SOCHx:  reports that she has never smoked. She does not have any smokeless tobacco history on file. She reports that she does not drink alcohol or use illicit drugs.  ALLERGIES: No Known Allergies  ROS: the vague chest pain in her posterior chest does not worsen with activity. She has clear-cut dyspnea on exertion with minimal activity. She has not had hemoptysis. She has not had shaking chills or fever. She does not describe intermittent claudication or recent focal nonfocal deficits. She denies polyuria polydipsia frequency urgency hematuria dysuria or flank pain. Has not had any abdominal pain diarrhea constipation nausea vomiting or dysphagia.she is unaware of any possible weight changes. She denies intolerance to heat or cold change in skin or hair texture other  symptoms of thyroid illness. She has not had any obvious melena or hematemesis. She denies any mood problems.   HOME MEDICATIONS: reviewed VITALS: Blood pressure 130/52, pulse 91, temperature 98.2 F (36.8 C), temperature source Oral, resp. rate 21, SpO2 100.00%.  PHYSICAL EXAM: General appearance: alert, cooperative, no distress and morbidly obese Neck: no adenopathy, no carotid bruit, supple, symmetrical, trachea midline, thyroid not enlarged, symmetric, no tenderness/mass/nodules  and JVP is impossible to assess due to severe obesity Lungs: clear to auscultation bilaterally Heart: regular rate and rhythm, S1, S2 normal and systolic murmur: early systolic 3/6, crescendo and decrescendo at 2nd right intercostal space, throughout the precordium Abdomen: severe obesity makes examination very limited but there are no obvious abnormalities Extremities: edema there is massive lymphedema to the thighs bilaterally with severe chronic trophic changes, venous stasis dermatitis noted and   Pulses: upper extremity pulses are normal the pedal pulses cannot be located due to a combination of obesity and severe edema Skin: note severe chronic trophic changes in the lower extremities Neurologic: Alert and oriented X 3, normal strength and tone. Normal symmetric reflexes. Normal coordination and gait  LABS: Results for orders placed during the hospital encounter of 09/29/11 (from the past 48 hour(s))  CBC     Status: Abnormal   Collection Time   09/29/11 10:27 AM      Component Value Range Comment   WBC 14.3 (*) 4.0 - 10.5 (K/uL)    RBC 3.49 (*) 3.87 - 5.11 (MIL/uL)    Hemoglobin 10.5 (*) 12.0 - 15.0 (g/dL)    HCT 78.2 (*) 95.6 - 46.0 (%)    MCV 92.3  78.0 - 100.0 (fL)    MCH 30.1  26.0 - 34.0 (pg)    MCHC 32.6  30.0 - 36.0 (g/dL)    RDW 21.3  08.6 - 57.8 (%)    Platelets 379  150 - 400 (K/uL)   DIFFERENTIAL     Status: Abnormal   Collection Time   09/29/11 10:27 AM      Component Value Range Comment   Neutrophils Relative 75  43 - 77 (%)    Neutro Abs 10.7 (*) 1.7 - 7.7 (K/uL)    Lymphocytes Relative 14  12 - 46 (%)    Lymphs Abs 2.0  0.7 - 4.0 (K/uL)    Monocytes Relative 9  3 - 12 (%)    Monocytes Absolute 1.2 (*) 0.1 - 1.0 (K/uL)    Eosinophils Relative 2  0 - 5 (%)    Eosinophils Absolute 0.3  0.0 - 0.7 (K/uL)    Basophils Relative 0  0 - 1 (%)    Basophils Absolute 0.1  0.0 - 0.1 (K/uL)   PRO B NATRIURETIC PEPTIDE     Status: Normal   Collection Time   09/29/11  10:27 AM      Component Value Range Comment   Pro B Natriuretic peptide (BNP) 188.6  0 - 450 (pg/mL)   TROPONIN I     Status: Normal   Collection Time   09/29/11 10:27 AM      Component Value Range Comment   Troponin I <0.30  <0.30 (ng/mL)   PROTIME-INR     Status: Abnormal   Collection Time   09/29/11 10:27 AM      Component Value Range Comment   Prothrombin Time 50.7 (*) 11.6 - 15.2 (seconds)    INR 5.49 (*) 0.00 - 1.49    POCT I-STAT, CHEM 8     Status:  Abnormal   Collection Time   09/29/11 10:44 AM      Component Value Range Comment   Sodium 139  135 - 145 (mEq/L)    Potassium 3.7  3.5 - 5.1 (mEq/L)    Chloride 103  96 - 112 (mEq/L)    BUN 24 (*) 6 - 23 (mg/dL)    Creatinine, Ser 1.61 (*) 0.50 - 1.10 (mg/dL)    Glucose, Bld 096 (*) 70 - 99 (mg/dL)    Calcium, Ion 0.45  1.12 - 1.32 (mmol/L)    TCO2 26  0 - 100 (mmol/L)    Hemoglobin 11.2 (*) 12.0 - 15.0 (g/dL)    HCT 40.9 (*) 81.1 - 46.0 (%)     IMAGING: Dg Chest 2 View  09/29/2011  *RADIOLOGY REPORT*  Clinical Data: Shortness of breath.  CHEST - 2 VIEW  Comparison: PA and lateral chest 09/19/2011.  Findings: Right pleural effusions seen on the comparison study appears decreased in size.  There is associated compressive atelectasis in the right base.  Cardiomegaly and pulmonary vascular congestion are noted.  IMPRESSION:  1.  Decreased right pleural effusion. 2.  Cardiomegaly vascular congestion.  Original Report Authenticated By: Bernadene Bell. Maricela Curet, M.D.    IMPRESSION: 1. Diastolic heart failure, acute on chronic. This is a very reasonable supposition although there is conflicting evidence. The BMP is quite low but the chest x-ray does appear his show some congestion. Unfortunately all diagnostic studies in a patient of her girth will be difficult to interpret. It is reasonable to treat her with diuretics.  In fact when one reviews the echo performed in October there are some findings that suggest that she had elevated  left heart pressures at that time. The E/Ea ratio was elevated.  2.  Persistent leukocytosis and neutrophilia  I'm not sure that stressed the margination is the only reason for this. Infection is still concerned.  3. Hypoprothrombinemia The recently increased dose of warfarin may explain the supra-therapeutic INR, in addition to hepatic congestion. She has had some difficulty maintaining therapeutic levels of anti-coagulation. We briefly discussed the possibility of switching to a newer agents with more predictable effects  4. Aortic stenosis, moderate Reviewing again the results of the echocardiogram from October one wonders whether we are slightly underestimating the severity of her aortic stenosis. Valve area of 1.2 cm square sounds fairly innocuous until indexed for her body surface area. Nevertheless thegradients suggest that she has only moderate aortic stenosis  5. Chronic kidney disease probably stage III This might limit her use of diuretics  RECOMMENDATION: 1. Agree with loop diuretics 2.   Time Spent Directly with Patient: 60 minutes  Thurmon Fair, MD, [facility] [phone]  09/29/2011, 7:09 PM

## 2011-09-30 LAB — BASIC METABOLIC PANEL
Calcium: 8.9 mg/dL (ref 8.4–10.5)
Chloride: 100 mEq/L (ref 96–112)
Creatinine, Ser: 1.32 mg/dL — ABNORMAL HIGH (ref 0.50–1.10)
GFR calc Af Amer: 44 mL/min — ABNORMAL LOW (ref >90)
Sodium: 137 mEq/L (ref 135–145)

## 2011-09-30 LAB — CBC
HCT: 30 % — ABNORMAL LOW (ref 36.0–46.0)
Platelets: 362 10*3/uL (ref 150–400)
RBC: 3.22 MIL/uL — ABNORMAL LOW (ref 3.87–5.11)
RDW: 15 % (ref 11.5–15.5)
WBC: 9.9 10*3/uL (ref 4.0–10.5)

## 2011-09-30 LAB — HEMOGLOBIN A1C
Hgb A1c MFr Bld: 5.7 % — ABNORMAL HIGH (ref <5.7)
Mean Plasma Glucose: 117 mg/dL — ABNORMAL HIGH (ref <117)

## 2011-09-30 LAB — PROTIME-INR: Prothrombin Time: 46.9 seconds — ABNORMAL HIGH (ref 11.6–15.2)

## 2011-09-30 MED ORDER — METOPROLOL SUCCINATE ER 25 MG PO TB24
25.0000 mg | ORAL_TABLET | Freq: Every day | ORAL | Status: DC
  Administered 2011-09-30 – 2011-10-06 (×7): 25 mg via ORAL
  Filled 2011-09-30 (×8): qty 1

## 2011-09-30 MED ORDER — FUROSEMIDE 40 MG PO TABS
40.0000 mg | ORAL_TABLET | Freq: Two times a day (BID) | ORAL | Status: DC
  Administered 2011-09-30 – 2011-10-06 (×12): 40 mg via ORAL
  Filled 2011-09-30 (×14): qty 1

## 2011-09-30 MED ORDER — AMLODIPINE BESYLATE 10 MG PO TABS
10.0000 mg | ORAL_TABLET | Freq: Every day | ORAL | Status: DC
  Administered 2011-09-30 – 2011-10-06 (×7): 10 mg via ORAL
  Filled 2011-09-30 (×8): qty 1

## 2011-09-30 NOTE — Progress Notes (Signed)
Subjective: Breathing better.   Objective: Weight change:   Intake/Output Summary (Last 24 hours) at 09/30/11 1723 Last data filed at 09/30/11 1415  Gross per 24 hour  Intake    840 ml  Output   1900 ml  Net  -1060 ml   Pt is alert afebrile and comfortable cvs S1 s2  Lungs decreased at bases abd soft non tender, obese. Extremities: chronic lymphadema  Lab Results: Results for orders placed during the hospital encounter of 09/29/11 (from the past 24 hour(s))  BASIC METABOLIC PANEL     Status: Abnormal   Collection Time   09/30/11  5:39 AM      Component Value Range   Sodium 137  135 - 145 (mEq/L)   Potassium 3.7  3.5 - 5.1 (mEq/L)   Chloride 100  96 - 112 (mEq/L)   CO2 28  19 - 32 (mEq/L)   Glucose, Bld 95  70 - 99 (mg/dL)   BUN 22  6 - 23 (mg/dL)   Creatinine, Ser 1.61 (*) 0.50 - 1.10 (mg/dL)   Calcium 8.9  8.4 - 09.6 (mg/dL)   GFR calc non Af Amer 38 (*) >90 (mL/min)   GFR calc Af Amer 44 (*) >90 (mL/min)  CBC     Status: Abnormal   Collection Time   09/30/11  5:39 AM      Component Value Range   WBC 9.9  4.0 - 10.5 (K/uL)   RBC 3.22 (*) 3.87 - 5.11 (MIL/uL)   Hemoglobin 9.7 (*) 12.0 - 15.0 (g/dL)   HCT 04.5 (*) 40.9 - 46.0 (%)   MCV 93.2  78.0 - 100.0 (fL)   MCH 30.1  26.0 - 34.0 (pg)   MCHC 32.3  30.0 - 36.0 (g/dL)   RDW 81.1  91.4 - 78.2 (%)   Platelets 362  150 - 400 (K/uL)  PRO B NATRIURETIC PEPTIDE     Status: Normal   Collection Time   09/30/11  5:39 AM      Component Value Range   Pro B Natriuretic peptide (BNP) 183.9  0 - 450 (pg/mL)  PROTIME-INR     Status: Abnormal   Collection Time   09/30/11  5:39 AM      Component Value Range   Prothrombin Time 46.9 (*) 11.6 - 15.2 (seconds)   INR 4.97 (*) 0.00 - 1.49   .  Micro Results: No results found for this or any previous visit (from the past 240 hour(s)).  Studies/Results: Dg Chest 2 View  09/29/2011  *RADIOLOGY REPORT*  Clinical Data: Shortness of breath.  CHEST - 2 VIEW  Comparison: PA and  lateral chest 09/19/2011.  Findings: Right pleural effusions seen on the comparison study appears decreased in size.  There is associated compressive atelectasis in the right base.  Cardiomegaly and pulmonary vascular congestion are noted.  IMPRESSION:  1.  Decreased right pleural effusion. 2.  Cardiomegaly vascular congestion.  Original Report Authenticated By: Bernadene Bell. Maricela Curet, M.D.   Dg Chest 2 View  09/19/2011  *RADIOLOGY REPORT*  Clinical Data: Cough.  CHEST - 2 VIEW  Comparison: Chest x-ray 08/09/2011.  Findings: The heart is borderline enlarged but stable.  The mediastinal and hilar contours are unchanged.  Chronic underlying bronchitic type lung changes with probable superimposed bronchitis or interstitial pneumonitis.  No definite infiltrates.  Moderate elevation of the right hemidiaphragm is noted.  A hiatal hernia is stable.  IMPRESSION: Chronic bronchitic type lung changes with probable superimposed bronchitis or interstitial pneumonitis.  Original Report Authenticated By: P. Loralie Champagne, M.D.   Medications: Scheduled Meds:   . a stronger pump book   Does not apply Once  . amLODipine  10 mg Oral Daily  . furosemide  40 mg Oral BID  . lidocaine      . metoprolol succinate  25 mg Oral Daily  . sodium chloride  3 mL Intravenous Q12H   Continuous Infusions:  PRN Meds:.sodium chloride, acetaminophen, acetaminophen, ondansetron (ZOFRAN) IV, ondansetron, oxyCODONE, sodium chloride  Assessment/Plan:  LOS: 1 day  Acute diastolic Heart failure: improving, continue with lasix  Pulmonary embolism: on coumadin. supratherapeutic Inr. Hold coumadin.  Leukocytosis : improved  Hypertension: controlled  Morbid obesity  Pt /ot eval Appreciate Cardiology input.    Natalina Wieting 09/30/2011, 5:23 PM

## 2011-09-30 NOTE — Progress Notes (Signed)
[facility]  Subjective: Feels about the same.  Perhaps breathing a little better.  No CP  Objective: Vital signs in last 24 hours: Temp:  [98.2 F (36.8 C)-98.5 F (36.9 C)] 98.5 F (36.9 C) (12/16 0508) Pulse Rate:  [91-96] 96  (12/16 0508) Resp:  [18] 18  (12/16 0508) BP: (109-131)/(52-78) 109/72 mmHg (12/16 0508) SpO2:  [96 %-100 %] 96 % (12/16 0508) Weight:  [161.8 kg (356 lb 11.3 oz)-163.2 kg (359 lb 12.7 oz)] 359 lb 12.7 oz (163.2 kg) (12/16 0508) Last BM Date: 09/28/11  Intake/Output from previous day: 12/15 0701 - 12/16 0700 In: 360 [P.O.:360] Out: 1000 [Urine:1000] Intake/Output this shift: Total I/O In: 240 [P.O.:240] Out: -   Medications Current Facility-Administered Medications  Medication Dose Route Frequency Provider Last Rate Last Dose  . 0.9 %  sodium chloride infusion  250 mL Intravenous PRN Hollice Espy, MD      . a stronger pump book   Does not apply Once Hollice Espy, MD      . acetaminophen (TYLENOL) tablet 650 mg  650 mg Oral Q6H PRN Hollice Espy, MD       Or  . acetaminophen (TYLENOL) suppository 650 mg  650 mg Rectal Q6H PRN Hollice Espy, MD      . furosemide (LASIX) tablet 40 mg  40 mg Oral Once Hollice Espy, MD   40 mg at 09/29/11 1449  . lidocaine (XYLOCAINE) 1 % (with pres) injection           . ondansetron (ZOFRAN) tablet 4 mg  4 mg Oral Q6H PRN Hollice Espy, MD       Or  . ondansetron [facility]) injection 4 mg  4 mg Intravenous Q6H PRN Hollice Espy, MD      . oxyCODONE (Oxy IR/ROXICODONE) immediate release tablet 5 mg  5 mg Oral Q4H PRN Hollice Espy, MD   5 mg at 09/30/11 0026  . sodium chloride 0.9 % injection 3 mL  3 mL Intravenous Q12H Hollice Espy, MD   3 mL at 09/30/11 0951  . sodium chloride 0.9 % injection 3 mL  3 mL Intravenous PRN Hollice Espy, MD        PE: General appearance: alert, cooperative and no distress Neck: no JVD Lungs: bilateral  rales Heart: regular rate and rhythm Extremities: Edematous Pulses: Radials 2+ and symmetric  Lab Results:   Basename 09/30/11 0539 09/29/11 1044 09/29/11 1027  WBC 9.9 -- 14.3*  HGB 9.7* 11.2* 10.5*  HCT 30.0* 33.0* 32.2*  PLT 362 -- 379   BMET  Basename 09/30/11 0539 09/29/11 1044  NA 137 139  K 3.7 3.7  CL 100 103  CO2 28 --  GLUCOSE 95 170*  BUN 22 24*  CREATININE 1.32* 1.40*  CALCIUM 8.9 --   PT/INR  Basename 09/30/11 0539 09/29/11 1027  LABPROT 46.9* 50.7*  INR 4.97* 5.49*    Studies/Results: CHEST - 2 VIEW   Comparison: PA and lateral chest 09/19/2011.   Findings: Right pleural effusions seen on the comparison study appears decreased in size.  There is associated compressive atelectasis in the right base.  Cardiomegaly and pulmonary vascular congestion are noted.   IMPRESSION:   1.  Decreased right pleural effusion. 2.  Cardiomegaly vascular congestion.  Assessment/Plan  Principal Problem:  *Acute diastolic congestive heart failure Active Problems:  Pulmonary embolism  Chronic kidney disease, stage II (mild)  Hypertension  GERD (gastroesophageal  reflux disease)  Arthritis  Morbid obesity  Leukocytosis  Coagulopathy  Lymphedema  Aortic stenosis, moderate  Plan: Wt up 10kg since 09/19/11.  Orders indicate the patient only received Lasix 40mg  PO times one.  Will order BID.  Reordered toprol xl and amlodipine.  They fell off the scheduled meds for some reason. Holding clonidine and HCTZ.   LOS: 1 day    HAGER,BRYAN W 09/30/2011 12:39 PM I have seen and examined the patient along with Wilburt Finlay, PA.  I have reviewed the chart, notes and new data.  I agree with PA's note.  Key new complaints: dyspnea better, feels very weak Key examination changes: still severely edematous Key new findings / data: renal function a little better, WBC now in normal range  PLAN: Stop clonidine (contributing to fatigue?) and HCTZ (treat with loop  diuretics).  Thurmon Fair, MD, [facility] [phone] 09/30/2011, 1:15 PM

## 2011-09-30 NOTE — Progress Notes (Signed)
ANTICOAGULATION CONSULT NOTE - Initial Consult  Pharmacy Consult for Warfarin Indication: VTE prophylaxis  No Known Allergies  Patient Measurements: Height: 5\' 7"  (170.2 cm) Weight: 359 lb 12.7 oz (163.2 kg) IBW/kg (Calculated) : 61.6    Vital Signs: Temp: 98.5 F (36.9 C) (12/16 0508) Temp src: Oral (12/16 0508) BP: 109/72 mmHg (12/16 0508) Pulse Rate: 96  (12/16 0508)  Labs:  Basename 09/30/11 0539 09/29/11 1044 09/29/11 1027  HGB 9.7* 11.2* --  HCT 30.0* 33.0* 32.2*  PLT 362 -- 379  APTT -- -- --  LABPROT 46.9* -- 50.7*  INR 4.97* -- 5.49*  HEPARINUNFRC -- -- --  CREATININE 1.32* 1.40* --  CKTOTAL -- -- --  CKMB -- -- --  TROPONINI -- -- <0.30   Estimated Creatinine Clearance: 56.7 ml/min (by C-G formula based on Cr of 1.32).  Medical History: Past Medical History  Diagnosis Date  . GERD (gastroesophageal reflux disease)   . Hypertension   . Diabetes mellitus     diet controlled  . Morbid obesity   . Arthritis   . Chronic kidney disease     nephrolithiasis left kidney,s/p open resection  . Status post arthroscopic surgery of right knee   . Diverticulosis of sigmoid colon   . Chronic back pain   . Pneumonia 07/2011    hx  . Pulmonary embolism 07/2011    hx  . Uterine cancer     endometrial high grade adenocarcinoma  . Uterus cancer, sarcoma     high grade invasive carcinosarcoma    Medications:  Scheduled:     . a stronger pump book   Does not apply Once  . furosemide  40 mg Oral Once  . lidocaine      . sodium chloride  3 mL Intravenous Q12H    Assessment:  80YOF on coumadin PTA for VTE prophylaxis following PE in 07/2011.   Therapy is complicated by patient's coagulopathy most likely 2/2 liver dysfunction/congestion.    Patient's home warfarin schedule is 4mg  daily except 6mg  on MWF.    INR currently supratherapeutic on this home schedule at 4.97 today.  No bleeding/complications reported.  Goal of Therapy:  INR 2-3   Plan:    No coumadin dose tonight.  Will order daily PT/INR.  Loralee Pacas R 09/30/2011,8:59 AM Pager: [phone]

## 2011-10-01 LAB — CBC
HCT: 29.9 % — ABNORMAL LOW (ref 36.0–46.0)
Hemoglobin: 9.4 g/dL — ABNORMAL LOW (ref 12.0–15.0)
MCH: 29.3 pg (ref 26.0–34.0)
MCHC: 31.4 g/dL (ref 30.0–36.0)

## 2011-10-01 LAB — BASIC METABOLIC PANEL
BUN: 20 mg/dL (ref 6–23)
Calcium: 8.8 mg/dL (ref 8.4–10.5)
Creatinine, Ser: 1.34 mg/dL — ABNORMAL HIGH (ref 0.50–1.10)
GFR calc non Af Amer: 37 mL/min — ABNORMAL LOW (ref >90)
Glucose, Bld: 108 mg/dL — ABNORMAL HIGH (ref 70–99)

## 2011-10-01 LAB — URIC ACID: Uric Acid, Serum: 10.7 mg/dL — ABNORMAL HIGH (ref 2.4–7.0)

## 2011-10-01 MED ORDER — EZETIMIBE 10 MG PO TABS
10.0000 mg | ORAL_TABLET | Freq: Every day | ORAL | Status: DC
  Administered 2011-10-01 – 2011-10-06 (×6): 10 mg via ORAL
  Filled 2011-10-01 (×7): qty 1

## 2011-10-01 MED ORDER — OXYCODONE HCL 5 MG PO TABS
5.0000 mg | ORAL_TABLET | Freq: Once | ORAL | Status: AC
  Administered 2011-10-01: 5 mg via ORAL
  Filled 2011-10-01: qty 1

## 2011-10-01 NOTE — Progress Notes (Signed)
Spoke with patient at bedside. States she lives a home with her son, other children assist with ADL's. Active with [facility] for PT/OT/RN. Patient feels she has all DME/services needed at d/c. PCP is through Dr. Renae Gloss. No other d/c needs identified, will notify [facility] of admission, plan to resume [facility] services. Patient appreciative of visit, will continue to follow.

## 2011-10-01 NOTE — Progress Notes (Signed)
Physical Therapy Evaluation Patient Details Name: Christine [name] MRN: [id number] DOB: 1931/03/29 Today's Date: 10/01/2011 [id number] eval 2   Problem List:  Patient Active Problem List  Diagnoses  . Uterine cancer  . Chronic back pain  . Pneumonia  . Pulmonary embolism  . Chronic kidney disease, stage II (mild)  . Diabetes mellitus type 1, controlled  . Hypertension  . GERD (gastroesophageal reflux disease)  . Arthritis  . Morbid obesity  . Leukocytosis  . Coagulopathy  . Acute diastolic congestive heart failure  . Lymphedema    Past Medical History:  Past Medical History  Diagnosis Date  . GERD (gastroesophageal reflux disease)   . Hypertension   . Diabetes mellitus     diet controlled  . Morbid obesity   . Arthritis   . Chronic kidney disease     nephrolithiasis left kidney,s/p open resection  . Status post arthroscopic surgery of right knee   . Diverticulosis of sigmoid colon   . Chronic back pain   . Pneumonia 07/2011    hx  . Pulmonary embolism 07/2011    hx  . Uterine cancer     endometrial high grade adenocarcinoma  . Uterus cancer, sarcoma     high grade invasive carcinosarcoma   Past Surgical History:  Past Surgical History  Procedure Date  . Abdominal hysterectomy     total with b/l salpingo-oopherectomy  . Colonoscopy     negative  . Knee arthroscopy     right knee    PT Assessment/Plan/Recommendation PT Assessment Clinical Impression Statement: pt will benefit from PT to maximize independence for home PT Recommendation/Assessment: Patient will need skilled PT in the acute care venue PT Problem List: Decreased strength;Obesity;Decreased mobility;Decreased activity tolerance;Decreased knowledge of use of DME Barriers to Discharge: None PT Therapy Diagnosis : Difficulty walking;Generalized weakness PT Plan PT Frequency: Min 3X/week PT Treatment/Interventions: DME instruction;Gait training;Stair training;Functional mobility  training;Therapeutic activities;Therapeutic exercise;Patient/family education PT Recommendation Follow Up Recommendations: Home health PT Equipment Recommended: None recommended by PT PT Goals  Acute Rehab PT Goals PT Goal Formulation: With patient Time For Goal Achievement: 7 days Pt will go Supine/Side to Sit: with min assist PT Goal: Supine/Side to Sit - Progress: Progressing toward goal Pt will go Sit to Stand: with min assist PT Goal: Sit to Stand - Progress: Progressing toward goal Pt will Ambulate: 16 - 50 feet;with supervision;with rolling walker PT Goal: Ambulate - Progress: Progressing toward goal  PT Evaluation Precautions/Restrictions  Precautions Precautions: Fall Prior Functioning  Home Living Lives With: Sheran Spine Help From: Family Type of Home: House Home Layout: One level Home Access: Level entry Home Adaptive Equipment: Hospital bed;Walker - rolling Prior Function Level of Independence: Needs assistance with ADLs;Requires assistive device for independence Cognition Cognition Arousal/Alertness: Awake/alert Overall Cognitive Status: Appears within functional limits for tasks assessed Sensation/Coordination   Extremity Assessment RLE Assessment RLE Assessment:  (limited by soft tissue;at least 2+/5) LLE Assessment LLE Assessment:  (limited by soft tissue;at least 2+/5) Mobility (including Balance) Bed Mobility Bed Mobility:  (+2 total assist pt =50%) Transfers Sit to Stand: 4: Min assist;From bed Stand to Sit: 4: Min assist;To chair/3-in-1;With armrests Ambulation/Gait Ambulation/Gait Assistance: 4: Min assist Ambulation Distance (Feet): 20 Feet Assistive device: Rolling walker Gait Pattern: Step-through pattern (wide BOS)    Exercise    End of Session PT - End of Session Activity Tolerance: Patient limited by fatigue Patient left: in chair, call bell within reach General Behavior During Session: [facility] for tasks performed Cognition:  [facility] for  tasks performed  [facility] 10/01/2011, 2:09 PM

## 2011-10-01 NOTE — Progress Notes (Signed)
[facility]  Subjective: More energetic. Walked a few steps with PT. Left MTP gout attack. Renal function improved from admission despite diuresis. Weight down 5 lb from admission. -1400 mL last 24h. Still seriously hypervolemic. BP good off clonidine. INR still high.  Objective: Vital signs in last 24 hours: Temp:  [98 F (36.7 C)-99.4 F (37.4 C)] 99.4 F (37.4 C) (12/17 1400) Pulse Rate:  [91-92] 91  (12/17 1400) Resp:  [18-20] 19  (12/17 1400) BP: (104-115)/(62-73) 109/73 mmHg (12/17 1400) SpO2:  [95 %-97 %] 97 % (12/17 1400) Weight:  [351 lb 6.6 oz (159.4 kg)] 351 lb 6.6 oz (159.4 kg) (12/17 0546) Last BM Date: 09/29/11 (per pt report)  Intake/Output from previous day: 12/16 0701 - 12/17 0700 In: 720 [P.O.:720] Out: 2100 [Urine:2100] Intake/Output this shift: Total I/O In: 480 [P.O.:480] Out: 700 [Urine:700]  Medications Current Facility-Administered Medications  Medication Dose Route Frequency Provider Last Rate Last Dose  . 0.9 %  sodium chloride infusion  250 mL Intravenous PRN Hollice Espy, MD      . acetaminophen (TYLENOL) tablet 650 mg  650 mg Oral Q6H PRN Hollice Espy, MD       Or  . acetaminophen (TYLENOL) suppository 650 mg  650 mg Rectal Q6H PRN Hollice Espy, MD      . amLODipine (NORVASC) tablet 10 mg  10 mg Oral Daily Dwana Melena, PA   10 mg at 10/01/11 1026  . ezetimibe (ZETIA) tablet 10 mg  10 mg Oral Daily Vijaya Akula   10 mg at 10/01/11 1219  . furosemide (LASIX) tablet 40 mg  40 mg Oral BID Dwana Melena, PA   40 mg at 10/01/11 0813  . metoprolol succinate (TOPROL-XL) 24 hr tablet 25 mg  25 mg Oral Daily Dwana Melena, PA   25 mg at 10/01/11 1026  . ondansetron (ZOFRAN) tablet 4 mg  4 mg Oral Q6H PRN Hollice Espy, MD       Or  . ondansetron (ZOFRAN) injection 4 mg  4 mg Intravenous Q6H PRN Hollice Espy, MD   4 mg at 09/30/11 1801  . oxyCODONE (Oxy IR/ROXICODONE) immediate release tablet 5 mg  5  mg Oral Q4H PRN Hollice Espy, MD   5 mg at 10/01/11 0323  . oxyCODONE (Oxy IR/ROXICODONE) immediate release tablet 5 mg  5 mg Oral Once Rolan Lipa   5 mg at 10/01/11 0414  . sodium chloride 0.9 % injection 3 mL  3 mL Intravenous Q12H Hollice Espy, MD   3 mL at 10/01/11 1026  . sodium chloride 0.9 % injection 3 mL  3 mL Intravenous PRN Hollice Espy, MD   3 mL at 09/30/11 2209    PE: General appearance: alert, cooperative and no distress Neck: no JVD Lungs: bilateral rales Heart: regular rate and rhythm Extremities: Edematous Pulses: Radials 2+ and symmetric  Lab Results:   Basename 10/01/11 0518 09/30/11 0539 09/29/11 1044 09/29/11 1027  WBC 12.4* 9.9 -- 14.3*  HGB 9.4* 9.7* 11.2* --  HCT 29.9* 30.0* 33.0* --  PLT 390 362 -- 379   BMET  Basename 10/01/11 0518 09/30/11 0539 09/29/11 1044  NA 135 137 139  K 4.0 3.7 3.7  CL 99 100 103  CO2 30 28 --  GLUCOSE 108* 95 170*  BUN 20 22 24*  CREATININE 1.34* 1.32* 1.40*  CALCIUM 8.8 8.9 --   PT/INR  Basename 10/01/11 0449  09/30/11 0539 09/29/11 1027  LABPROT 45.2* 46.9* 50.7*  INR 4.74* 4.97* 5.49*    Studies/Results: CHEST - 2 VIEW   Comparison: PA and lateral chest 09/19/2011.   Findings: Right pleural effusions seen on the comparison study appears decreased in size.  There is associated compressive atelectasis in the right base.  Cardiomegaly and pulmonary vascular congestion are noted.   IMPRESSION:   1.  Decreased right pleural effusion. 2.  Cardiomegaly vascular congestion.  Assessment/Plan  Principal Problem:  *Acute diastolic congestive heart failure Active Problems:  Pulmonary embolism  Chronic kidney disease, stage II (mild)  Hypertension  GERD (gastroesophageal reflux disease)  Arthritis  Morbid obesity  Leukocytosis  Coagulopathy  Lymphedema  Aortic stenosis, moderate  Continue diuretics Check uric acid level Antiinflamatories for gout, avoid NSAIDs;  allopurinol/Uloric if hyperuricemic No other changes recommended at this time.  Thurmon Fair, MD, [facility] [phone] 10/01/2011, 3:51 PM

## 2011-10-01 NOTE — Progress Notes (Signed)
Pharmacy Note (Brief) Warfarin for VTE prophy  77 YO F on coumadin prior to admission for VTE prophylaxis following PE in 07/2011. INR remains supratherapeutic (4.74). No bleeding reported in chart notes.  Plan:  1) No warfarin tonight. 2) F/U daily INR.  Darrol Angel, PharmD 10/01/2011 1:20 PM

## 2011-10-01 NOTE — Progress Notes (Signed)
Occupational Therapy Evaluation Patient Details Name: Christine [name] MRN: [id number] DOB: [birth year] Today's Date: 10/01/2011  Problem List:  Patient Active Problem List  Diagnoses  . Uterine cancer  . Chronic back pain  . Pneumonia  . Pulmonary embolism  . Chronic kidney disease, stage II (mild)  . Diabetes mellitus type 1, controlled  . Hypertension  . GERD (gastroesophageal reflux disease)  . Arthritis  . Morbid obesity  . Leukocytosis  . Coagulopathy  . Acute diastolic congestive heart failure  . Lymphedema    Past Medical History:  Past Medical History  Diagnosis Date  . GERD (gastroesophageal reflux disease)   . Hypertension   . Diabetes mellitus     diet controlled  . Morbid obesity   . Arthritis   . Chronic kidney disease     nephrolithiasis left kidney,s/p open resection  . Status post arthroscopic surgery of right knee   . Diverticulosis of sigmoid colon   . Chronic back pain   . Pneumonia 07/2011    hx  . Pulmonary embolism 07/2011    hx  . Uterine cancer     endometrial high grade adenocarcinoma  . Uterus cancer, sarcoma     high grade invasive carcinosarcoma   Past Surgical History:  Past Surgical History  Procedure Date  . Abdominal hysterectomy     total with b/l salpingo-oopherectomy  . Colonoscopy     negative  . Knee arthroscopy     right knee    OT Assessment/Plan/Recommendation OT Assessment Clinical Impression Statement: Pt is a 77 year old woman with complicated medical history currently admitted due to SOB/pulmonary edema.  Pt was assisted for all IADL and LB ADL prior to admission.  She self fed, groomed at sink, and toileted with DME prior to admission.  Pt has good support at home.  Will follow pt acutely for OT.  Recommend return home with family's assist.  Do not anticipate any further OT upon d/c as pt has had extensive OT in recent weeks and is close to baseline. OT Recommendation/Assessment: Patient will need skilled OT in  the acute care venue OT Problem List: Decreased strength;Pain;Impaired balance (sitting and/or standing);Decreased activity tolerance OT Therapy Diagnosis : Generalized weakness OT Plan OT Frequency: Min 2X/week OT Treatment/Interventions: Self-care/ADL training;Energy conservation;Therapeutic activities;Patient/family education OT Recommendation Follow Up Recommendations: None Equipment Recommended: None recommended by OT Individuals Consulted Consulted and Agree with Results and Recommendations: Patient OT Goals Acute Rehab OT Goals OT Goal Formulation: With patient Time For Goal Achievement: 2 weeks ADL Goals Pt Will Perform Grooming: with supervision;Standing at sink (2 activities) ADL Goal: Grooming - Progress: Other (comment) Pt Will Transfer to Toilet: with supervision;Ambulation;Grab bars ADL Goal: Toilet Transfer - Progress: Other (comment) Pt Will Perform Toileting - Clothing Manipulation: with supervision;Standing ADL Goal: Toileting - Clothing Manipulation - Progress: Other (comment) Pt Will Perform Toileting - Hygiene: Sit to stand from 3-in-1/toilet;with supervision ADL Goal: Toileting - Hygiene - Progress: Other (comment) Miscellaneous OT Goals Miscellaneous OT Goal #1: Pt will utilized purse lip breathing techniques during ADL with min cues. OT Goal: Miscellaneous Goal #1 - Progress: Other (comment)  OT Evaluation Precautions/Restrictions  Precautions Precautions: Fall Restrictions Weight Bearing Restrictions: No Prior Functioning Home Living Lives With: Sheran Spine Help From: Family Type of Home: House Home Layout: One level Home Access: Level entry Bathroom Shower/Tub: Tub/shower unit;Curtain Bathroom Toilet: Standard (grab bars around) Home Adaptive Equipment: Hospital bed;Walker - rolling;Shower chair without back;Grab bars around toilet Prior Function Level of Independence:  Needs assistance with ADLs;Needs assistance with homemaking;Requires  assistive device for independence;Needs assistance with tranfers Bath: Moderate Meal Prep: Total Laundry: Total Vacuuming: Total Light Housekeeping: Total Shopping: Total Driving: Yes (up until 2 months ago) Vocation: Retired ADL ADL Eating/Feeding: Simulated;Independent Where Assessed - Eating/Feeding: Chair Grooming: Performed;Wash/dry face;Set up Where Assessed - Grooming: Sitting, chair Upper Body Bathing: Minimal assistance;Simulated Upper Body Bathing Details (indicate cue type and reason): assist for back Where Assessed - Upper Body Bathing: Sitting, bed Lower Body Bathing: Simulated;+1 Total assistance Lower Body Bathing Details (indicate cue type and reason): Pt required assist at baseline. Where Assessed - Lower Body Bathing: Sit to stand from bed;Sitting, bed Upper Body Dressing: Performed;Set up Upper Body Dressing Details (indicate cue type and reason): front opening gown Where Assessed - Upper Body Dressing: Sitting, bed Lower Body Dressing: +1 Total assistance;Performed Lower Body Dressing Details (indicate cue type and reason): Pt dependent at baseline Where Assessed - Lower Body Dressing: Supine, head of bed up Toilet Transfer: Simulated;Minimal assistance Toilet Transfer Method: Ambulating Equipment Used: Rolling walker ADL Comments: Pt tolerating bed mobility and ADL at bed level.  HR increased to 116 and pt with dyspnea with ambulation. Pt has been educated in LB adaptive equipment, but is not interested Vision/Perception  Vision - History Baseline Vision: Wears glasses only for reading Visual History: Cataracts Patient Visual Report: No change from baseline Cognition Cognition Arousal/Alertness: Awake/alert Overall Cognitive Status: Appears within functional limits for tasks assessed Sensation/Coordination Sensation Light Touch: Appears Intact Hot/Cold: Appears Intact Proprioception: Appears Intact Coordination Fine Motor Movements are Fluid and  Coordinated: Yes Extremity Assessment RUE Assessment RUE Assessment: Within Functional Limits LUE Assessment LUE Assessment: Within Functional Limits Mobility  Bed Mobility Bed Mobility: Yes (+2 Total assist, pt 50 %, HOB up 45 degrees. Assist forLEs) End of Session OT - End of Session Activity Tolerance: Patient limited by fatigue Patient left: in chair;with call bell in reach General Behavior During Session: [facility] for tasks performed Cognition: [facility] for tasks performed   Evern Bio 10/01/2011, 10:18 AM  [phone]

## 2011-10-01 NOTE — Progress Notes (Signed)
Subjective: Right foot and toe pain.  Objective: Weight change: -2.4 kg (-5 lb 4.7 oz)  Intake/Output Summary (Last 24 hours) at 10/01/11 1119 Last data filed at 10/01/11 0900  Gross per 24 hour  Intake    720 ml  Output   2100 ml  Net  -1380 ml   Pt is alert afebrile and comfortable  cvs S1 s2  Lungs decreased at bases  abd soft non tender, obese.  Extremities: chronic lymphadema, outgrown nails and tenderness in the right big toe.    Lab Results: Results for orders placed during the hospital encounter of 09/29/11 (from the past 24 hour(s))  PROTIME-INR     Status: Abnormal   Collection Time   10/01/11  4:49 AM      Component Value Range   Prothrombin Time 45.2 (*) 11.6 - 15.2 (seconds)   INR 4.74 (*) 0.00 - 1.49      Micro Results: No results found for this or any previous visit (from the past 240 hour(s)).  Studies/Results: Dg Chest 2 View  09/29/2011  *RADIOLOGY REPORT*  Clinical Data: Shortness of breath.  CHEST - 2 VIEW  Comparison: PA and lateral chest 09/19/2011.  Findings: Right pleural effusions seen on the comparison study appears decreased in size.  There is associated compressive atelectasis in the right base.  Cardiomegaly and pulmonary vascular congestion are noted.  IMPRESSION:  1.  Decreased right pleural effusion. 2.  Cardiomegaly vascular congestion.  Original Report Authenticated By: Bernadene Bell. Maricela Curet, M.D.   Dg Chest 2 View  09/19/2011  *RADIOLOGY REPORT*  Clinical Data: Cough.  CHEST - 2 VIEW  Comparison: Chest x-ray 08/09/2011.  Findings: The heart is borderline enlarged but stable.  The mediastinal and hilar contours are unchanged.  Chronic underlying bronchitic type lung changes with probable superimposed bronchitis or interstitial pneumonitis.  No definite infiltrates.  Moderate elevation of the right hemidiaphragm is noted.  A hiatal hernia is stable.  IMPRESSION: Chronic bronchitic type lung changes with probable superimposed bronchitis or  interstitial pneumonitis.  Original Report Authenticated By: P. Loralie Champagne, M.D.   Medications: Scheduled Meds:   . amLODipine  10 mg Oral Daily  . ezetimibe  10 mg Oral Daily  . furosemide  40 mg Oral BID  . metoprolol succinate  25 mg Oral Daily  . oxyCODONE  5 mg Oral Once  . sodium chloride  3 mL Intravenous Q12H   Continuous Infusions:  PRN Meds:.sodium chloride, acetaminophen, acetaminophen, ondansetron (ZOFRAN) IV, ondansetron, oxyCODONE, sodium chloride  Assessment/Plan: Acute diastolic Heart failure: improving, pt still sob on exertion.  continue with lasix  And metoprolol.  Pulmonary embolism: on coumadin at home, currently. supratherapeutic Inr. Hold coumadin tonight. Leukocytosis :resolved.  Hypertension: controlled  Morbid obesity  Right toe pain: ? Gout flare up. If the the pain doesn't improve, will start her on prednisone CKD : improving. Will check labs today.  Pt /ot eval  Appreciate Cardiology input.     LOS: 2 days   Natisha Trzcinski 10/01/2011, 11:19 AM

## 2011-10-01 NOTE — Plan of Care (Signed)
Problem: Food- and Nutrition-Related Knowledge Deficit (NB-1.1) Goal: Nutrition education Formal process to instruct or train a patient/client in a skill or to impart knowledge to help patients/clients voluntarily manage or modify food choices and eating behavior to maintain or improve health.  Outcome: Adequate for Discharge Pt states she was previously on a heart healthy/low sodium diet in the past, but is currently eating a regular diet.  Pt reports that her daughter prepares her meals, but her daughter is not currently present.  Discussed overall goals in low sodium eating, and the potential therapeutic advantage she may receive from decreasing sodium intake.  Obtained general food frequency from pt and offered encouragement and areas of improvement. Documented notes on handout 'Low Sodium Nutrition Therapy' for pt.  Expect fair compliance as pt denies she is main meal preparer.  Encourage pt to request return visit from RD via nursing staff if daughter presents with questions.

## 2011-10-02 LAB — BASIC METABOLIC PANEL
Calcium: 8.6 mg/dL (ref 8.4–10.5)
GFR calc Af Amer: 40 mL/min — ABNORMAL LOW (ref >90)
GFR calc non Af Amer: 34 mL/min — ABNORMAL LOW (ref >90)
Glucose, Bld: 99 mg/dL (ref 70–99)
Potassium: 4 mEq/L (ref 3.5–5.1)
Sodium: 134 mEq/L — ABNORMAL LOW (ref 135–145)

## 2011-10-02 LAB — PROTIME-INR
INR: 4.41 — ABNORMAL HIGH (ref 0.00–1.49)
Prothrombin Time: 42.7 seconds — ABNORMAL HIGH (ref 11.6–15.2)

## 2011-10-02 LAB — CBC
Hemoglobin: 9.4 g/dL — ABNORMAL LOW (ref 12.0–15.0)
Platelets: 351 10*3/uL (ref 150–400)
RBC: 3.18 MIL/uL — ABNORMAL LOW (ref 3.87–5.11)
WBC: 12.4 10*3/uL — ABNORMAL HIGH (ref 4.0–10.5)

## 2011-10-02 MED ORDER — PREDNISONE 50 MG PO TABS
50.0000 mg | ORAL_TABLET | Freq: Every day | ORAL | Status: DC
  Administered 2011-10-02 – 2011-10-03 (×2): 50 mg via ORAL
  Filled 2011-10-02 (×3): qty 1

## 2011-10-02 NOTE — Progress Notes (Signed)
  Pharmacy Note (Brief) Warfarin for VTE prophy  77 YO F on coumadin prior to admission for VTE prophylaxis following PE in 07/2011. INR remains supratherapeutic but trending down (4.74--> 4.41). No bleeding reported in chart notes. Has not had any warfarin in hospital.  Plan:  1) Continue to hold warfarin. 2) F/U daily INR.  Darrol Angel, PharmD 10/02/2011 1:11 PM

## 2011-10-02 NOTE — Progress Notes (Signed)
Subjective: No chest pain, no SOB.  Objective: Vital signs in last 24 hours: Temp:  [98.1 F (36.7 C)-99.1 F (37.3 C)] 98.3 F (36.8 C) (12/18 1316) Pulse Rate:  [85-91] 91  (12/18 1316) Resp:  [18-20] 20  (12/18 1316) BP: (106-131)/(65-74) 131/74 mmHg (12/18 1316) SpO2:  [94 %-96 %] 94 % (12/18 1316) Weight:  [158.7 kg (349 lb 13.9 oz)] 349 lb 13.9 oz (158.7 kg) (12/18 0507) Weight change: -0.7 kg (-1 lb 8.7 oz) Last BM Date: 09/29/11 Intake/Output from previous day:-1380 12/17 0701 - 12/18 0700 In: 483 [P.O.:480; I.V.:3] Out: 1625 [Urine:1625] Intake/Output this shift: Total I/O In: 240 [P.O.:240] Out: 825 [Urine:825]  PE: General:A&O X3 Heart:Heart sounds distant Lungs:clear ION:GEXB, non-tender MWU:[id number]   Lab Results:  Basename 10/02/11 0430 10/01/11 0518  WBC 12.4* 12.4*  HGB 9.4* 9.4*  HCT 29.8* 29.9*  PLT 351 390   BMET  Basename 10/02/11 0430 10/01/11 0518  NA 134* 135  K 4.0 4.0  CL 97 99  CO2 31 30  GLUCOSE 99 108*  BUN 21 20  CREATININE 1.42* 1.34*  CALCIUM 8.6 8.8   No results found for this basename: TROPONINI:2,CK,MB:2 in the last 72 hours  Lab Results  Component Value Date   CHOL 173 08/10/2011   HDL 24* 08/10/2011   LDLCALC 122* 08/10/2011   TRIG 137 08/10/2011   CHOLHDL 7.2 08/10/2011   Lab Results  Component Value Date   HGBA1C 5.7* 09/29/2011     Lab Results  Component Value Date   TSH 1.832 08/09/2011    Hepatic Function Panel No results found for this basename: PROT,ALBUMIN,AST,ALT,ALKPHOS,BILITOT,BILIDIR,IBILI in the last 72 hours No results found for this basename: CHOL in the last 72 hours No results found for this basename: PROTIME in the last 72 hours    EKG: Orders placed during the hospital encounter of 09/29/11  . ED EKG  . ED EKG  . EKG    Studies/Results: No results found.  Medications: I have reviewed the patient's current medications.  Assessment/Plan: Patient Active Problem List    Diagnoses  . Uterine cancer  . Chronic back pain  . Pneumonia  . Pulmonary embolism  . Chronic kidney disease, stage II (mild)  . Diabetes mellitus type 1, controlled  . Hypertension  . GERD (gastroesophageal reflux disease)  . Arthritis  . Morbid obesity  . Leukocytosis  . Coagulopathy  . Acute diastolic congestive heart failure  . Lymphedema   PLAN:  This is a 77 y.o. female with a past medical history significant for Recent pneumonia and pulmonary embolism, mild to moderate aortic stenosis, diastolic heart failure, systemic hypertension, hyperlipidemia, long-standing lymphedema of the lower extremities, previous uterine cancer and diet-controlled diabetes mellitus.  Admitted with diastolic CHF   Improving Diastolic CHF.  NYHA functional class 3-4.  Now on Lasix po 40 mg. BID.   Continues with supratheraputic  INR at 4.41, coumadin on hold.  Moderate AS  CKD stage 3.  Cr. Stable   LOS: 3 days   INGOLD,LAURA R 10/02/2011, 4:29 PM Agree with note written by Nada Boozer RNP  Pt diuresing and gradually clinically improving. The low initial BNP is a bit conflicting. Cr stable. Severe lymphedema. INR supra therapeutic. Pharm managing. Cont current therapy.   Runell Gess 10/02/2011 4:49 PM

## 2011-10-02 NOTE — Progress Notes (Signed)
Physical Therapy Treatment Patient Details Name: GERDA YIN MRN: [id number] DOB: 1931-02-10 Today's Date: 10/02/2011 Time: 5621-3086  1G PT Assessment/Plan  PT - Assessment/Plan Comments on Treatment Session: Pt able to ambulate with PT today but limited by fatigue and pain in R great toe. Plan is still for home with family assisting.  PT Plan: Discharge plan remains appropriate Follow Up Recommendations: Home health PT Equipment Recommended: None recommended by PT PT Goals  Acute Rehab PT Goals PT Goal: Supine/Side to Sit - Progress: Progressing toward goal PT Goal: Sit to Stand - Progress: Progressing toward goal PT Goal: Ambulate - Progress: Progressing toward goal  PT Treatment Precautions/Restrictions  Precautions Precautions: Fall Restrictions Weight Bearing Restrictions: No Mobility (including Balance) Bed Mobility Bed Mobility: Yes Supine to Sit: 1: +2 Total assist;HOB elevated (Comment degrees);With rails (Pt=50%) Supine to Sit Details (indicate cue type and reason): Assist for bilateral LEs off EOB due to lymphedema Transfers Transfers: Yes Sit to Stand: 4: Min assist;From bed;With upper extremity assist Sit to Stand Details (indicate cue type and reason): Assist to rise, steady. (Pt=85%) Stand to Sit: 4: Min assist;With upper extremity assist;With armrests;To chair/3-in-1 Stand to Sit Details: Assist to control descent.  Ambulation/Gait Ambulation/Gait: Yes Ambulation/Gait Assistance: 4: Min assist Ambulation/Gait Assistance Details (indicate cue type and reason): Pt fatigues easily. Followed closely with recliner.  Ambulation Distance (Feet): 25 Feet Assistive device: Rolling walker Gait Pattern: Trunk flexed;Step-through pattern    Exercise    End of Session PT - End of Session Equipment Utilized During Treatment: Gait belt Activity Tolerance: Patient limited by fatigue;Patient limited by pain Patient left: in chair;with call bell in  reach General Behavior During Session: [facility] for tasks performed Cognition: [facility] for tasks performed  [name] [facility] 10/02/2011, 10:13 AM [phone]

## 2011-10-02 NOTE — Progress Notes (Signed)
Subjective: Pt appears comfortable. Denies any new complaints.  Objective: Weight change: -0.7 kg (-1 lb 8.7 oz)  Intake/Output Summary (Last 24 hours) at 10/02/11 1847 Last data filed at 10/02/11 1300  Gross per 24 hour  Intake    243 ml  Output   1750 ml  Net  -1507 ml   Pt is alert afebrile and comfortable  cvs S1 s2  Lungs decreased at bases  abd soft non tender, obese.  Extremities: chronic lymphadema, outgrown nails and tenderness in the right big toe.     Lab Results: Results for orders placed during the hospital encounter of 09/29/11 (from the past 24 hour(s))  PROTIME-INR     Status: Abnormal   Collection Time   10/02/11  4:30 AM      Component Value Range   Prothrombin Time 42.7 (*) 11.6 - 15.2 (seconds)   INR 4.41 (*) 0.00 - 1.49   BASIC METABOLIC PANEL     Status: Abnormal   Collection Time   10/02/11  4:30 AM      Component Value Range   Sodium 134 (*) 135 - 145 (mEq/L)   Potassium 4.0  3.5 - 5.1 (mEq/L)   Chloride 97  96 - 112 (mEq/L)   CO2 31  19 - 32 (mEq/L)   Glucose, Bld 99  70 - 99 (mg/dL)   BUN 21  6 - 23 (mg/dL)   Creatinine, Ser 1.61 (*) 0.50 - 1.10 (mg/dL)   Calcium 8.6  8.4 - 09.6 (mg/dL)   GFR calc non Af Amer 34 (*) >90 (mL/min)   GFR calc Af Amer 40 (*) >90 (mL/min)  CBC     Status: Abnormal   Collection Time   10/02/11  4:30 AM      Component Value Range   WBC 12.4 (*) 4.0 - 10.5 (K/uL)   RBC 3.18 (*) 3.87 - 5.11 (MIL/uL)   Hemoglobin 9.4 (*) 12.0 - 15.0 (g/dL)   HCT 04.5 (*) 40.9 - 46.0 (%)   MCV 93.7  78.0 - 100.0 (fL)   MCH 29.6  26.0 - 34.0 (pg)   MCHC 31.5  30.0 - 36.0 (g/dL)   RDW 81.1  91.4 - 78.2 (%)   Platelets 351  150 - 400 (K/uL)     Micro Results: No results found for this or any previous visit (from the past 240 hour(s)).  Studies/Results: Dg Chest 2 View  09/29/2011  *RADIOLOGY REPORT*  Clinical Data: Shortness of breath.  CHEST - 2 VIEW  Comparison: PA and lateral chest 09/19/2011.  Findings: Right pleural  effusions seen on the comparison study appears decreased in size.  There is associated compressive atelectasis in the right base.  Cardiomegaly and pulmonary vascular congestion are noted.  IMPRESSION:  1.  Decreased right pleural effusion. 2.  Cardiomegaly vascular congestion.  Original Report Authenticated By: Bernadene Bell. Maricela Curet, M.D.   Dg Chest 2 View  09/19/2011  *RADIOLOGY REPORT*  Clinical Data: Cough.  CHEST - 2 VIEW  Comparison: Chest x-ray 08/09/2011.  Findings: The heart is borderline enlarged but stable.  The mediastinal and hilar contours are unchanged.  Chronic underlying bronchitic type lung changes with probable superimposed bronchitis or interstitial pneumonitis.  No definite infiltrates.  Moderate elevation of the right hemidiaphragm is noted.  A hiatal hernia is stable.  IMPRESSION: Chronic bronchitic type lung changes with probable superimposed bronchitis or interstitial pneumonitis.  Original Report Authenticated By: P. Loralie Champagne, M.D.   Medications: Scheduled Meds:   . amLODipine  10 mg Oral Daily  . ezetimibe  10 mg Oral Daily  . furosemide  40 mg Oral BID  . metoprolol succinate  25 mg Oral Daily  . predniSONE  50 mg Oral Q breakfast  . sodium chloride  3 mL Intravenous Q12H   Continuous Infusions:  PRN Meds:.sodium chloride, acetaminophen, acetaminophen, ondansetron (ZOFRAN) IV, ondansetron, oxyCODONE, sodium chloride  Assessment/Plan: Patient Active Hospital Problem List: Acute diastolic congestive heart failure (09/29/2011)   Appears to be compensated. On BID of lasix. Appreciate Cardiology input Pulmonary embolism () supra therapeutic INR. Coumadin as per the pharmacy.   Chronic kidney disease, stage II (mild) (09/20/2011)  stable.  Hypertension (09/20/2011) Controlled. Gouty flare up: on a trial of steroids.   Continue with PT /OT. DISPOSION: D/C HOME WITH HOME PT.   LOS: 3 days   Christine [name] 10/02/2011, 6:47 PM

## 2011-10-03 DIAGNOSIS — IMO0001 Reserved for inherently not codable concepts without codable children: Secondary | ICD-10-CM

## 2011-10-03 LAB — BASIC METABOLIC PANEL
BUN: 25 mg/dL — ABNORMAL HIGH (ref 6–23)
Chloride: 97 mEq/L (ref 96–112)
GFR calc Af Amer: 42 mL/min — ABNORMAL LOW (ref >90)
GFR calc non Af Amer: 36 mL/min — ABNORMAL LOW (ref >90)
Potassium: 3.9 mEq/L (ref 3.5–5.1)
Sodium: 134 mEq/L — ABNORMAL LOW (ref 135–145)

## 2011-10-03 LAB — PROTIME-INR
INR: 3.84 — ABNORMAL HIGH (ref 0.00–1.49)
Prothrombin Time: 38.3 seconds — ABNORMAL HIGH (ref 11.6–15.2)

## 2011-10-03 MED ORDER — PREDNISONE 20 MG PO TABS
40.0000 mg | ORAL_TABLET | Freq: Every day | ORAL | Status: DC
  Administered 2011-10-04 – 2011-10-06 (×3): 40 mg via ORAL
  Filled 2011-10-03 (×4): qty 2

## 2011-10-03 NOTE — Progress Notes (Addendum)
NAME: Christine [name] MRN: [id number] DOB:  August 10, 1931  ADMIT DATE: 09/29/2011  This is a 77 y.o. female with a past medical history significant for Recent pneumonia and pulmonary embolism, mild to moderate aortic stenosis, diastolic heart failure, systemic hypertension, hyperlipidemia, long-standing lymphedema of the lower extremities, previous uterine cancer and diet-controlled diabetes mellitus. Admitted with diastolic CHF   Subjective:  No SOB at rest, toe pain a little better.  Objective:  Vital Signs in the last 24 hours: Temp:  [97.5 F (36.4 C)-98.3 F (36.8 C)] 97.5 F (36.4 C) (12/19 0530) Pulse Rate:  [84-91] 84  (12/19 0530) Resp:  [18-20] 18  (12/19 0530) BP: (94-131)/(64-74) 114/70 mmHg (12/19 0530) SpO2:  [94 %-100 %] 95 % (12/19 0530) Weight:  [156.7 kg (345 lb 7.4 oz)] 345 lb 7.4 oz (156.7 kg) (12/19 0530)  Intake/Output from previous day:  Intake/Output Summary (Last 24 hours) at 10/03/11 1053 Last data filed at 10/03/11 0831  Gross per 24 hour  Intake    723 ml  Output   1625 ml  Net   -902 ml    Physical Exam: General appearance: alert, cooperative, no distress and morbidly obese Lungs: clear to auscultation bilaterally Heart: regular rate and rhythm and 2/6 syst murmur LSB   Rate: 80  Rhythm: normal sinus rhythm and frequent PAC's on 12/18  Lab Results:  Basename 10/02/11 0430 10/01/11 0518  WBC 12.4* 12.4*  HGB 9.4* 9.4*  PLT 351 390    Basename 10/03/11 0500 10/02/11 0430  NA 134* 134*  K 3.9 4.0  CL 97 97  CO2 30 31  GLUCOSE 126* 99  BUN 25* 21  CREATININE 1.36* 1.42*   No results found for this basename: TROPONINI:2,CK,MB:2 in the last 72 hours Hepatic Function Panel No results found for this basename: PROT,ALBUMIN,AST,ALT,ALKPHOS,BILITOT,BILIDIR,IBILI in the last 72 hours No results found for this basename: CHOL in the last 72 hours  Basename 10/03/11 0500  INR 3.84*    Imaging: No results found.  Cardiac  Studies:  Assessment/Plan:   Principal Problem:  *Acute diastolic congestive heart failure, 2D Oct 2012, improving. Adm wgt 161.8kg- todays wgt 156.7kg.  Active Problems:  Pulmonary embolism, Oct 2012 - on warfarin  Chronic kidney disease, stage II (mild)  Arthritis, ? Gout,  on high dose steroids since 12/16 for toe pain   Hypertension  GERD (gastroesophageal reflux disease)  Morbid obesity   Coagulopathy, supratheraputic INR  Lymphedema  Diabetes mellitus, diet controlled  Moderate AS  CKD-3 0    Plan-MD to see. Taper steroids as soon as possible so as not to exacerbate fluid retention, CHF.   Christine Shelter PA-C 10/03/2011, 10:53 AM  Pt seen & examined, chart reviewed.  I agree with Christine [name] note.  Continues to diurese with PO lasix -- still has room for diuresis, would continue with Lasix bid (instead of HCTZ for d/c), can be adjusted in OP by primary cardiologist. On Steroid taper for gouty flare BP stable INR still supratherapeutic, holding warfarin.  No additional recommendations.  Would like to see how well she does ambulating & lying flat. -- still a bit sob walking.   -- did relatively well with trial having head of bed nearly flat.   Christine [name], M.D., M.S. [facility] [address] [place], [place]  [address]  [phone]  10/03/2011 7:24 PM

## 2011-10-03 NOTE — Progress Notes (Signed)
  Pharmacy Note (Brief) Warfarin for VTE prophy  77 YO F on coumadin prior to admission for VTE prophylaxis following PE in 07/2011. INR remains supratherapeutic but continues to trend down (4.41--> 3.84). No bleeding reported in chart notes. Has not had any warfarin in hospital. Plan to resume warfarin dosing once INR<3.  Plan:  1) Continue to hold warfarin. 2) F/U daily INR.  Darrol Angel, PharmD 10/03/2011 7:19 AM

## 2011-10-03 NOTE — Progress Notes (Signed)
Subjective: Foot feels better, breathing improving  Objective: Weight change: -2 kg (-4 lb 6.6 oz)  Intake/Output Summary (Last 24 hours) at 10/03/11 1731 Last data filed at 10/03/11 1300  Gross per 24 hour  Intake    723 ml  Output   1600 ml  Net   -877 ml   BP 125/75  Pulse 80  Temp(Src) 97.8 F (36.6 C) (Oral)  Resp 17  Ht 5\' 7"  (1.702 m)  Wt 156.7 kg (345 lb 7.4 oz)  BMI 54.11 kg/m2  SpO2 98% NAD CTA B S1 S2, RRR Soft, NT, NDBS+ Lymph edema b/l in LE Right great toe shows no erythema, tenderness  Lab Results: Basic Metabolic Panel:  Basename 10/03/11 0500 10/02/11 0430  NA 134* 134*  K 3.9 4.0  CL 97 97  CO2 30 31  GLUCOSE 126* 99  BUN 25* 21  CREATININE 1.36* 1.42*  CALCIUM 9.0 8.6  MG -- --  PHOS -- --   Liver Function Tests: No results found for this basename: AST:2,ALT:2,ALKPHOS:2,BILITOT:2,PROT:2,ALBUMIN:2 in the last 72 hours No results found for this basename: LIPASE:2,AMYLASE:2 in the last 72 hours No results found for this basename: AMMONIA:2 in the last 72 hours CBC:  Basename 10/02/11 0430 10/01/11 0518  WBC 12.4* 12.4*  NEUTROABS -- --  HGB 9.4* 9.4*  HCT 29.8* 29.9*  MCV 93.7 93.1  PLT 351 390   Cardiac Enzymes: No results found for this basename: CKTOTAL:3,CKMB:3,CKMBINDEX:3,TROPONINI:3 in the last 72 hours BNP: No components found with this basename: POCBNP:3 D-Dimer: No results found for this basename: DDIMER:2 in the last 72 hours CBG: No results found for this basename: GLUCAP:6 in the last 72 hours Hemoglobin A1C: No results found for this basename: HGBA1C in the last 72 hours Fasting Lipid Panel: No results found for this basename: CHOL,HDL,LDLCALC,TRIG,CHOLHDL,LDLDIRECT in the last 72 hours Thyroid Function Tests: No results found for this basename: TSH,T4TOTAL,FREET4,T3FREE,THYROIDAB in the last 72 hours Anemia Panel: No results found for this basename: VITAMINB12,FOLATE,FERRITIN,TIBC,IRON,RETICCTPCT in the last 72  hours Coagulation:  Basename 10/03/11 0500 10/02/11 0430  LABPROT 38.3* 42.7*  INR 3.84* 4.41*   Urine Drug Screen: Drugs of Abuse  No results found for this basename: labopia, cocainscrnur, labbenz, amphetmu, thcu, labbarb    Alcohol Level: No results found for this basename: ETH:2 in the last 72 hours   Micro Results: No results found for this or any previous visit (from the past 240 hour(s)).  Studies/Results: Dg Chest 2 View  09/29/2011  *RADIOLOGY REPORT*  Clinical Data: Shortness of breath.  CHEST - 2 VIEW  Comparison: PA and lateral chest 09/19/2011.  Findings: Right pleural effusions seen on the comparison study appears decreased in size.  There is associated compressive atelectasis in the right base.  Cardiomegaly and pulmonary vascular congestion are noted.  IMPRESSION:  1.  Decreased right pleural effusion. 2.  Cardiomegaly vascular congestion.  Original Report Authenticated By: Bernadene Bell. Maricela Curet, M.D.   Dg Chest 2 View  09/19/2011  *RADIOLOGY REPORT*  Clinical Data: Cough.  CHEST - 2 VIEW  Comparison: Chest x-ray 08/09/2011.  Findings: The heart is borderline enlarged but stable.  The mediastinal and hilar contours are unchanged.  Chronic underlying bronchitic type lung changes with probable superimposed bronchitis or interstitial pneumonitis.  No definite infiltrates.  Moderate elevation of the right hemidiaphragm is noted.  A hiatal hernia is stable.  IMPRESSION: Chronic bronchitic type lung changes with probable superimposed bronchitis or interstitial pneumonitis.  Original Report Authenticated By: P. Loralie Champagne, M.D.  Medications: Scheduled Meds:   . amLODipine  10 mg Oral Daily  . ezetimibe  10 mg Oral Daily  . furosemide  40 mg Oral BID  . metoprolol succinate  25 mg Oral Daily  . predniSONE  50 mg Oral Q breakfast  . sodium chloride  3 mL Intravenous Q12H   Continuous Infusions:  PRN Meds:.sodium chloride, acetaminophen, acetaminophen, ondansetron  (ZOFRAN) IV, ondansetron, oxyCODONE, sodium chloride  Assessment/Plan: #1. Acute on chronic diastolic congestive heart failure. Patient is is on Lasix and has been diuresing. She is being followed by [facility] heart and vascular. We will continue Lasix for now and monitor volume status. #2. Acute gouty arthropathy. Patient was started on prednisone for acute gouty arthropathy. Her uric acid was also found to be elevated. She's since had symptomatic improvement. We'll start prednisone taper. #3. Chronic kidney disease, stable #4. History of recent pulmonary embolism, patient is continued on Coumadin, her INR is supratherapeutic. #5. Hypertension, continue to follow #6. Disposition. patient will plan on returning home with physical therapy and occupational therapy. We'll plan on discharge when she is cleared by the cardiology service.  . LOS: 4 days   Christine [name] 10/03/2011, 5:31 PM

## 2011-10-04 LAB — BASIC METABOLIC PANEL
BUN: 31 mg/dL — ABNORMAL HIGH (ref 6–23)
Calcium: 9.5 mg/dL (ref 8.4–10.5)
GFR calc Af Amer: 42 mL/min — ABNORMAL LOW (ref >90)
GFR calc non Af Amer: 37 mL/min — ABNORMAL LOW (ref >90)
Glucose, Bld: 110 mg/dL — ABNORMAL HIGH (ref 70–99)
Potassium: 4 mEq/L (ref 3.5–5.1)
Sodium: 136 mEq/L (ref 135–145)

## 2011-10-04 LAB — CBC
Hemoglobin: 9.6 g/dL — ABNORMAL LOW (ref 12.0–15.0)
MCH: 28.9 pg (ref 26.0–34.0)
MCHC: 31.8 g/dL (ref 30.0–36.0)
Platelets: 415 10*3/uL — ABNORMAL HIGH (ref 150–400)
RBC: 3.32 MIL/uL — ABNORMAL LOW (ref 3.87–5.11)

## 2011-10-04 LAB — PROTIME-INR: Prothrombin Time: 31.9 seconds — ABNORMAL HIGH (ref 11.6–15.2)

## 2011-10-04 MED ORDER — WARFARIN SODIUM 2 MG PO TABS
2.0000 mg | ORAL_TABLET | Freq: Once | ORAL | Status: AC
  Administered 2011-10-04: 2 mg via ORAL
  Filled 2011-10-04 (×2): qty 1

## 2011-10-04 NOTE — Progress Notes (Signed)
Subjective: Breathing improving, wants go try moving around.  Objective: Vital signs in last 24 hours: Temp:  [97.4 F (36.3 C)-98 F (36.7 C)] 97.8 F (36.6 C) (12/20 1337) Pulse Rate:  [77-79] 79  (12/20 1337) Resp:  [16-18] 16  (12/20 1337) BP: (122-130)/(58-69) 130/69 mmHg (12/20 1337) SpO2:  [96 %-100 %] 97 % (12/20 1337) Weight:  [158.8 kg (350 lb 1.5 oz)] 350 lb 1.5 oz (158.8 kg) (12/20 0420) Weight change: 2.1 kg (4 lb 10.1 oz) Last BM Date: 10/03/11  Intake/Output from previous day: 12/19 0701 - 12/20 0700 In: 843 [P.O.:840; I.V.:3] Out: 1850 [Urine:1850] Total I/O In: 480 [P.O.:480] Out: 1100 [Urine:1100]   Physical Exam: General: Alert, awake, oriented x3, in no acute distress. HEENT: No bruits, no goiter. Heart: Regular rate and rhythm, without murmurs, rubs, gallops. Lungs: Clear to auscultation bilaterally. Abdomen: Soft, nontender, nondistended, positive bowel sounds. Extremities: lymphedema b/l Neuro: Grossly intact, nonfocal.    Lab Results: Basic Metabolic Panel:  Basename 10/04/11 0415 10/03/11 0500  NA 136 134*  K 4.0 3.9  CL 96 97  CO2 32 30  GLUCOSE 110* 126*  BUN 31* 25*  CREATININE 1.35* 1.36*  CALCIUM 9.5 9.0  MG -- --  PHOS -- --   Liver Function Tests: No results found for this basename: AST:2,ALT:2,ALKPHOS:2,BILITOT:2,PROT:2,ALBUMIN:2 in the last 72 hours No results found for this basename: LIPASE:2,AMYLASE:2 in the last 72 hours No results found for this basename: AMMONIA:2 in the last 72 hours CBC:  Basename 10/04/11 0415 10/02/11 0430  WBC 15.4* 12.4*  NEUTROABS -- --  HGB 9.6* 9.4*  HCT 30.2* 29.8*  MCV 91.0 93.7  PLT 415* 351   Cardiac Enzymes: No results found for this basename: CKTOTAL:3,CKMB:3,CKMBINDEX:3,TROPONINI:3 in the last 72 hours BNP: No components found with this basename: POCBNP:3 D-Dimer: No results found for this basename: DDIMER:2 in the last 72 hours CBG: No results found for this basename:  GLUCAP:6 in the last 72 hours Hemoglobin A1C: No results found for this basename: HGBA1C in the last 72 hours Fasting Lipid Panel: No results found for this basename: CHOL,HDL,LDLCALC,TRIG,CHOLHDL,LDLDIRECT in the last 72 hours Thyroid Function Tests: No results found for this basename: TSH,T4TOTAL,FREET4,T3FREE,THYROIDAB in the last 72 hours Anemia Panel: No results found for this basename: VITAMINB12,FOLATE,FERRITIN,TIBC,IRON,RETICCTPCT in the last 72 hours Coagulation:  Basename 10/04/11 0415 10/03/11 0500  LABPROT 31.9* 38.3*  INR 3.03* 3.84*   Urine Drug Screen: Drugs of Abuse  No results found for this basename: labopia, cocainscrnur, labbenz, amphetmu, thcu, labbarb    Alcohol Level: No results found for this basename: ETH:2 in the last 72 hours  No results found for this or any previous visit (from the past 240 hour(s)).  Studies/Results: No results found.  Medications: Scheduled Meds:   . amLODipine  10 mg Oral Daily  . ezetimibe  10 mg Oral Daily  . furosemide  40 mg Oral BID  . metoprolol succinate  25 mg Oral Daily  . predniSONE  40 mg Oral Q breakfast  . sodium chloride  3 mL Intravenous Q12H  . warfarin  2 mg Oral ONCE-1800  . DISCONTD: predniSONE  50 mg Oral Q breakfast   Continuous Infusions:  PRN Meds:.sodium chloride, acetaminophen, acetaminophen, ondansetron (ZOFRAN) IV, ondansetron, oxyCODONE, sodium chloride  Assessment/Plan:  #1. Acute on chronic diastolic congestive heart failure. Patient is is on Lasix and has been diuresing. She is being followed by [facility] heart and vascular. She is on oral lasix and is diuresing.  She will follow  up with [facility]  #2. Acute gouty arthropathy. Patient was started on prednisone for acute gouty arthropathy. Her uric acid was also found to be elevated. She's since had symptomatic improvement. On prednisone taper.  #3. Chronic kidney disease, stable  #4. History of recent pulmonary embolism, patient is continued on  Coumadin, her INR is supratherapeutic.  #5. Hypertension, continue to follow  #6. Disposition. patient will plan on returning home with physical therapy and occupational therapy. Have asked Physical therapy to work with patient to ensure that she is able to ambulate and does not get too short of breath especially if she is to go home.  Possible D/C home in am   LOS: 5 days   Adylene Dlugosz [facility]  10/04/2011, 4:13 PM

## 2011-10-04 NOTE — Progress Notes (Signed)
Subjective:  SOB better. Still not worked with Phys Therapy  Objective:  Vital Signs in the last 24 hours: Temp:  [97.4 F (36.3 C)-98 F (36.7 C)] 97.8 F (36.6 C) (12/20 1337) Pulse Rate:  [77-80] 79  (12/20 1337) Resp:  [16-18] 16  (12/20 1337) BP: (122-130)/(58-75) 130/69 mmHg (12/20 1337) SpO2:  [96 %-100 %] 97 % (12/20 1337) Weight:  [158.8 kg (350 lb 1.5 oz)] 350 lb 1.5 oz (158.8 kg) (12/20 0420)  Intake/Output from previous day:  Intake/Output Summary (Last 24 hours) at 10/04/11 1345 Last data filed at 10/04/11 1300  Gross per 24 hour  Intake    843 ml  Output   2150 ml  Net  -1307 ml    Physical Exam: General appearance: alert, cooperative, no distress and morbidly obese Lungs: decreased breath sounds Rt base Heart: regular rate and rhythm and 2/6 syst murmur LSB massive lower extremity lymphadema   Rate: 80  Rhythm: normal sinus rhythm  Lab Results:  Basename 10/04/11 0415 10/02/11 0430  WBC 15.4* 12.4*  HGB 9.6* 9.4*  PLT 415* 351    Basename 10/04/11 0415 10/03/11 0500  NA 136 134*  K 4.0 3.9  CL 96 97  CO2 32 30  GLUCOSE 110* 126*  BUN 31* 25*  CREATININE 1.35* 1.36*   No results found for this basename: TROPONINI:2,CK,MB:2 in the last 72 hours Hepatic Function Panel No results found for this basename: PROT,ALBUMIN,AST,ALT,ALKPHOS,BILITOT,BILIDIR,IBILI in the last 72 hours No results found for this basename: CHOL in the last 72 hours  Basename 10/04/11 0415  INR 3.03*    Imaging: No results found.  Cardiac Studies:  Assessment/Plan:   Principal Problem:  *Acute diastolic congestive heart failure, 2D Oct 2012 Active Problems:  Pulmonary embolism, Oct 2012  Chronic kidney disease, stage II (mild)  Arthritis, on high dose steroids since 12/16 for toe pain  Hypertension  GERD (gastroesophageal reflux disease)  Morbid obesity  Coagulopathy, supratheraputic INR  Lymphedema  Diabetes mellitus, diet controlled   Plan-We will  arrange follow up with Dr Allyson Sabal, Dr Kirtland Bouchard. [facility] follows INR. OK to d/c from our standpoint on lasix 40mg  BID.when she can ambulate to BR and back.   Corine Shelter PA-C 10/04/2011, 1:45 PM   I have seen and examined the patient along with Corine Shelter, PA.  I have reviewed the chart, notes and new data.  I agree with PA's note.  Key new complaints: a little stronger Key examination changes: massive lymphedema persists; Surprisingly weight recorded today has gone up 5 lbs despite -1300 mL. Suspect weight is erroneous. As of yesterday, weight had decreased 11 lbs from admit and 14 lb from peak weight on 12/16 Key new findings / data: creatinine stable around 1.3; INR close to therapeutic range.  PLAN: Her baseline level of activity is very limited. Agree that she is ready for DC when able to ambulate bedroom to bathroom safely.  Thurmon Fair, MD, [facility] [phone] 10/04/2011, 4:20 PM

## 2011-10-04 NOTE — Progress Notes (Signed)
ANTICOAGULATION CONSULT NOTE - Follow Up Consult  Pharmacy Consult for Warfarin Indication: VTE prophylaxis  No Known Allergies  Patient Measurements: Height: 5\' 7"  (170.2 cm) Weight: 350 lb 1.5 oz (158.8 kg) IBW/kg (Calculated) : 61.6   Labs:  Basename 10/04/11 0415 10/03/11 0500 10/02/11 0430  HGB 9.6* -- 9.4*  HCT 30.2* -- 29.8*  PLT 415* -- 351  APTT -- -- --  LABPROT 31.9* 38.3* 42.7*  INR 3.03* 3.84* 4.41*  HEPARINUNFRC -- -- --  CREATININE 1.35* 1.36* 1.42*  CKTOTAL -- -- --  CKMB -- -- --  TROPONINI -- -- --   Estimated Creatinine Clearance: 54.5 ml/min (by C-G formula based on Cr of 1.35).  Medications:  Scheduled:     . amLODipine  10 mg Oral Daily  . ezetimibe  10 mg Oral Daily  . furosemide  40 mg Oral BID  . metoprolol succinate  25 mg Oral Daily  . predniSONE  40 mg Oral Q breakfast  . sodium chloride  3 mL Intravenous Q12H  . DISCONTD: predniSONE  50 mg Oral Q breakfast    Assessment: 80YOF on coumadin PTA for VTE prophylaxis following PE in 07/2011. Therapy is complicated by patient's coagulopathy most likely 2/2 liver dysfunction/congestion. Patient's INR has been supratherapeutic since admission 6 days ago. Patient's home warfarin schedule is 4mg  daily except 6mg  on MWF. INR finally dropped down near goal INR 2-3.  Will initiate warfarin tonight for anticipated drop in INR tomorrow.  Will start lower than home dose considering INR 5.49 on admission from home dosing. Potential drug interaction with prednisone noted (may increase INR).  Goal of Therapy:  INR 2-3   Plan:  Coumadin 2mg  po once today. Follow up INR in AM.   Clance Boll 10/04/2011,1:33 PM

## 2011-10-05 LAB — BASIC METABOLIC PANEL
BUN: 32 mg/dL — ABNORMAL HIGH (ref 6–23)
Creatinine, Ser: 1.3 mg/dL — ABNORMAL HIGH (ref 0.50–1.10)
GFR calc Af Amer: 44 mL/min — ABNORMAL LOW (ref >90)
GFR calc non Af Amer: 38 mL/min — ABNORMAL LOW (ref >90)

## 2011-10-05 LAB — PROTIME-INR
INR: 2.72 — ABNORMAL HIGH (ref 0.00–1.49)
Prothrombin Time: 29.3 seconds — ABNORMAL HIGH (ref 11.6–15.2)

## 2011-10-05 MED ORDER — WARFARIN SODIUM 2 MG PO TABS
2.0000 mg | ORAL_TABLET | Freq: Once | ORAL | Status: AC
  Administered 2011-10-05: 2 mg via ORAL
  Filled 2011-10-05: qty 1

## 2011-10-05 NOTE — Progress Notes (Addendum)
ANTICOAGULATION CONSULT NOTE - Follow Up Consult  Pharmacy Consult for Warfarin Indication: VTE prophylaxis  No Known Allergies  Patient Measurements: Height: 5\' 7"  (170.2 cm) Weight: 348 lb 5.2 oz (158 kg) IBW/kg (Calculated) : 61.6   Labs:  Basename 10/05/11 0437 10/04/11 0415 10/03/11 0500  HGB -- 9.6* --  HCT -- 30.2* --  PLT -- 415* --  APTT -- -- --  LABPROT 29.3* 31.9* 38.3*  INR 2.72* 3.03* 3.84*  HEPARINUNFRC -- -- --  CREATININE 1.30* 1.35* 1.36*  CKTOTAL -- -- --  CKMB -- -- --  TROPONINI -- -- --   Estimated Creatinine Clearance: 56.4 ml/min (by C-G formula based on Cr of 1.3).  Medications:  Scheduled:     . amLODipine  10 mg Oral Daily  . ezetimibe  10 mg Oral Daily  . furosemide  40 mg Oral BID  . metoprolol succinate  25 mg Oral Daily  . predniSONE  40 mg Oral Q breakfast  . sodium chloride  3 mL Intravenous Q12H  . warfarin  2 mg Oral ONCE-1800    Assessment: 80YOF on coumadin PTA for VTE prophylaxis following PE in 07/2011. Therapy is complicated by patient's coagulopathy most likely 2/2 liver dysfunction/congestion. Patient's INR was supratherapeutic during the first 6 days of admission. Patient's home warfarin schedule is 4mg  daily except 6mg  on MWF. INR finally within therapeutic range. Resumed small dose warfarin last night. Potential drug interaction with prednisone noted.  Goal of Therapy:  INR 2-3   Plan:  Repeat warfarin 2mg  po once today. Follow up INR in AM. If patient discharged home today, will need close monitoring of INR until stable. Next INR check should be tomorrow.  Clance Boll 10/05/2011,11:03 AM

## 2011-10-05 NOTE — Progress Notes (Signed)
Occupational Therapy Treatment Patient Details Name: Christine [name] MRN: [id number] DOB: [birth year] Today's Date: 10/05/2011 1SC [id number] OT Assessment/Plan OT Assessment/Plan Comments on Treatment Session: Co-tx with PT. Pt still limited by poor activity tolerance. Demonstrates good knowledge of energy conservation when sitting to perform necessary ADLs OT Plan: Discharge plan remains appropriate OT Frequency: Min 2X/week Follow Up Recommendations: None Equipment Recommended: None recommended by OT OT Goals ADL Goals ADL Goal: Grooming - Progress: Progressing toward goals ADL Goal: Toilet Transfer - Progress: Progressing toward goals ADL Goal: Toileting - Clothing Manipulation - Progress: Progressing toward goals ADL Goal: Toileting - Hygiene - Progress: Not addressed Miscellaneous OT Goals OT Goal: Miscellaneous Goal #1 - Progress: Progressing toward goals  OT Treatment Precautions/Restrictions  Precautions Precautions: Fall   ADL ADL Grooming: Performed;Wash/dry hands;Set up Where Assessed - Grooming: Sitting at sink Upper Body Bathing: Performed;Chest;Set up Where Assessed - Upper Body Bathing: Sitting at sink Toilet Transfer: Performed;Supervision/safety Toilet Transfer Method: Proofreader: Regular height toilet;Grab bars Mobility  Bed Mobility Bed Mobility: No Transfers Transfers: Yes Sit to Stand: 5: Supervision;With upper extremity assist;With armrests;From chair/3-in-1;From toilet Stand to Sit: 5: Supervision;With upper extremity assist;With armrests;To chair/3-in-1;To toilet Exercises    End of Session OT - End of Session Equipment Utilized During Treatment: Other (comment) (RW) Activity Tolerance: Patient limited by fatigue;Treatment limited secondary to medical complications (Comment) (increased heart rate with minimal activity) Patient left: in chair;with call bell in reach General Behavior During Session: [facility] for tasks  performed Cognition: [facility] for tasks performed  Ashrith Sagan A [phone] 10/05/2011, 2:32 PM

## 2011-10-05 NOTE — Progress Notes (Signed)
Subjective: Got very short of breath while ambulating  Objective: Vital signs in last 24 hours: Temp:  [97.2 F (36.2 C)-98 F (36.7 C)] 97.2 F (36.2 C) (12/21 1407) Pulse Rate:  [72-155] 105  (12/21 1425) Resp:  [18-20] 20  (12/21 1407) BP: (121-151)/(63-84) 136/77 mmHg (12/21 1407) SpO2:  [95 %-98 %] 95 % (12/21 1425) Weight:  [158 kg (348 lb 5.2 oz)] 348 lb 5.2 oz (158 kg) (12/21 0700) Weight change: -0.8 kg (-1 lb 12.2 oz) Last BM Date: 10/03/11  Intake/Output from previous day: 12/20 0701 - 12/21 0700 In: 720 [P.O.:720] Out: 3675 [Urine:3675] Total I/O In: 3 [I.V.:3] Out: 1200 [Urine:1200]   Physical Exam: General: Alert, awake, oriented x3, in no acute distress. HEENT: No bruits, no goiter. Heart: Regular rate and rhythm, without murmurs, rubs, gallops. Lungs: Clear to auscultation bilaterally. Abdomen: Soft, nontender, nondistended, positive bowel sounds. Extremities:b/l LE lymph edema Neuro: Grossly intact, nonfocal.    Lab Results: Basic Metabolic Panel:  Basename 10/05/11 0437 10/04/11 0415  NA 135 136  K 4.0 4.0  CL 96 96  CO2 31 32  GLUCOSE 97 110*  BUN 32* 31*  CREATININE 1.30* 1.35*  CALCIUM 9.0 9.5  MG -- --  PHOS -- --   Liver Function Tests: No results found for this basename: AST:2,ALT:2,ALKPHOS:2,BILITOT:2,PROT:2,ALBUMIN:2 in the last 72 hours No results found for this basename: LIPASE:2,AMYLASE:2 in the last 72 hours No results found for this basename: AMMONIA:2 in the last 72 hours CBC:  Basename 10/04/11 0415  WBC 15.4*  NEUTROABS --  HGB 9.6*  HCT 30.2*  MCV 91.0  PLT 415*   Cardiac Enzymes: No results found for this basename: CKTOTAL:3,CKMB:3,CKMBINDEX:3,TROPONINI:3 in the last 72 hours BNP: No components found with this basename: POCBNP:3 D-Dimer: No results found for this basename: DDIMER:2 in the last 72 hours CBG: No results found for this basename: GLUCAP:6 in the last 72 hours Hemoglobin A1C: No results found  for this basename: HGBA1C in the last 72 hours Fasting Lipid Panel: No results found for this basename: CHOL,HDL,LDLCALC,TRIG,CHOLHDL,LDLDIRECT in the last 72 hours Thyroid Function Tests: No results found for this basename: TSH,T4TOTAL,FREET4,T3FREE,THYROIDAB in the last 72 hours Anemia Panel: No results found for this basename: VITAMINB12,FOLATE,FERRITIN,TIBC,IRON,RETICCTPCT in the last 72 hours Coagulation:  Basename 10/05/11 0437 10/04/11 0415  LABPROT 29.3* 31.9*  INR 2.72* 3.03*   Urine Drug Screen: Drugs of Abuse  No results found for this basename: labopia, cocainscrnur, labbenz, amphetmu, thcu, labbarb    Alcohol Level: No results found for this basename: ETH:2 in the last 72 hours  No results found for this or any previous visit (from the past 240 hour(s)).  Studies/Results: No results found.  Medications: Scheduled Meds:   . amLODipine  10 mg Oral Daily  . ezetimibe  10 mg Oral Daily  . furosemide  40 mg Oral BID  . metoprolol succinate  25 mg Oral Daily  . predniSONE  40 mg Oral Q breakfast  . sodium chloride  3 mL Intravenous Q12H  . warfarin  2 mg Oral ONCE-1800   Continuous Infusions:  PRN Meds:.sodium chloride, acetaminophen, acetaminophen, ondansetron (ZOFRAN) IV, ondansetron, oxyCODONE, sodium chloride  Assessment/Plan:  #1. Acute on chronic diastolic congestive heart failure. Patient is is on Lasix and has been diuresing. She is being followed by [facility] heart [facility] vascular. She is on oral lasix and is diuresing. She will follow up with [facility]  #2. Acute gouty arthropathy. Patient was started on prednisone for acute gouty arthropathy. Her uric  acid was also found to be elevated. She's since had symptomatic improvement. On prednisone taper.  #3. Chronic kidney disease, stable  #4. History of recent pulmonary embolism, patient is continued on Coumadin, her INR is supratherapeutic.  #5. Hypertension, continue to follow  #6. Disposition. patient will plan  on returning home with physical therapy and occupational therapy. Have asked Physical therapy to work with patient to ensure that she is able to ambulate and does not get too short of breath especially if she is to go home.  Her tachycardia is likely physiologic in response to exertion since it resolves at rest.  Plan will be for discharge home tomorrow in am.    LOS: 6 days   MEMON,JEHANZEB [facility]  10/05/2011, 6:02 PM

## 2011-10-05 NOTE — Progress Notes (Signed)
Physical Therapy Treatment Patient Details Name: Christine [name] MRN: [id number] DOB: [birth year] Today's Date: 10/05/2011 Time: 7829-5621 Charge: Leonia Reeves PT Assessment/Plan  PT - Assessment/Plan Comments on Treatment Session: Pt with limited ambulation distance today 2* fatigues easily, dyspnea, and HR 155 with 30 feet of ambulation. PT Plan: Discharge plan remains appropriate;Frequency remains appropriate Equipment Recommended: None recommended by PT PT Goals  Acute Rehab PT Goals Pt will go Sit to Stand: with modified independence PT Goal: Sit to Stand - Progress: Updated due to goal met PT Goal: Ambulate - Progress: Progressing toward goal  PT Treatment Precautions/Restrictions  Precautions Precautions: Fall Restrictions Weight Bearing Restrictions: No Mobility (including Balance) Bed Mobility Bed Mobility: No Transfers Transfers: Yes Sit to Stand: 5: Supervision;With upper extremity assist;From chair/3-in-1;With armrests Stand to Sit: 4: Min assist;To chair/3-in-1;With armrests;With upper extremity assist Stand to Sit Details: min/guard 2* elevated HR with ambulation Ambulation/Gait Ambulation/Gait: Yes Ambulation/Gait Assistance: 4: Min assist Ambulation/Gait Assistance Details (indicate cue type and reason): min/guard, verbal cues for posture and RW distance, pt became SOB, vitals taken: 90% SaO2 on RA and HR 155 so recliner brought behind pt Ambulation Distance (Feet): 30 Feet Assistive device: Rolling walker Gait Pattern: Trunk flexed;Step-through pattern    Exercise    End of Session PT - End of Session Activity Tolerance: Patient limited by fatigue (elevated HR with gait) Patient left: in chair;with call bell in reach General Behavior During Session: [facility] for tasks performed Cognition: [facility] for tasks performed  Cylan Borum,KATHrine E 10/05/2011, 3:16 PM Pager: [phone]

## 2011-10-06 LAB — BASIC METABOLIC PANEL
BUN: 35 mg/dL — ABNORMAL HIGH (ref 6–23)
Calcium: 9.2 mg/dL (ref 8.4–10.5)
Creatinine, Ser: 1.29 mg/dL — ABNORMAL HIGH (ref 0.50–1.10)
GFR calc Af Amer: 45 mL/min — ABNORMAL LOW (ref >90)
GFR calc non Af Amer: 39 mL/min — ABNORMAL LOW (ref >90)
Glucose, Bld: 85 mg/dL (ref 70–99)

## 2011-10-06 LAB — PROTIME-INR: Prothrombin Time: 24.9 seconds — ABNORMAL HIGH (ref 11.6–15.2)

## 2011-10-06 MED ORDER — WARFARIN SODIUM 4 MG PO TABS
4.0000 mg | ORAL_TABLET | Freq: Once | ORAL | Status: DC
  Filled 2011-10-06: qty 1

## 2011-10-06 MED ORDER — PREDNISONE 20 MG PO TABS: ORAL_TABLET | ORAL | Status: DC

## 2011-10-06 MED ORDER — FUROSEMIDE 40 MG PO TABS: 40.0000 mg | ORAL_TABLET | Freq: Two times a day (BID) | ORAL | Status: DC

## 2011-10-06 MED ORDER — ALUM & MAG HYDROXIDE-SIMETH 200-200-20 MG/5ML PO SUSP
30.0000 mL | ORAL | Status: DC | PRN
  Administered 2011-10-06: 30 mL via ORAL
  Filled 2011-10-06: qty 30

## 2011-10-06 NOTE — Discharge Summary (Signed)
Physician Discharge Summary  Patient ID: Christine [name] MRN: [id number] DOB/AGE: 1931-01-14 77 y.o.  Admit date: 09/29/2011 Discharge date: 10/06/2011  Primary Care Physician:  Alva Garnet., MD   Discharge Diagnoses:    Principal Problem:  *Acute diastolic congestive heart failure, 2D Oct 2012 Active Problems:  Pulmonary embolism, Oct 2012  Chronic kidney disease, stage II (mild)  Hypertension  GERD (gastroesophageal reflux disease)  Acute Gouty Arthropathy  Morbid obesity  Coagulopathy, supratheraputic INR  Lymphedema  Diabetes mellitus, diet controlled    Current Discharge Medication List    START taking these medications   Details  furosemide (LASIX) 40 MG tablet Take 1 tablet (40 mg total) by mouth 2 (two) times daily. Qty: 60 tablet, Refills: 1    predniSONE (DELTASONE) 20 MG tablet Take 30mg  po daily for 2 days then 20mg  po daily for 2 days then 10mg  po daily for 2 days then stop Qty: 6 tablet, Refills: 0      CONTINUE these medications which have NOT CHANGED   Details  amLODipine (NORVASC) 10 MG tablet Take 10 mg by mouth daily.      ezetimibe (ZETIA) 10 MG tablet Take 10 mg by mouth daily.     HYDROcodone-acetaminophen (NORCO) 10-325 MG per tablet Take 1 tablet by mouth every 6 (six) hours as needed. For pain.     metoCLOPramide (REGLAN) 10 MG tablet Take 10 mg by mouth 4 (four) times daily as needed. For nausea. Patient states that she takes once a day as needed.    metoprolol succinate (TOPROL-XL) 25 MG 24 hr tablet Take 25 mg by mouth daily.      Vitamins A & D (VITAMIN A & D) ointment Apply 1 application topically daily. Applied to chest.     warfarin (COUMADIN) 4 MG tablet Take 4-6 mg by mouth daily. 1.5 tabs Monday,wed, and Friday, rest days 1 tab, 4mg  stytarting today 09/24/11    zolpidem (AMBIEN) 5 MG tablet Take 5-10 mg by mouth at bedtime as needed. For sleep.       STOP taking these medications     cloNIDine (CATAPRES) 0.2 MG  tablet      Dextromethorphan-Guaifenesin (CORICIDIN HBP CONGESTION/COUGH PO)      hydrochlorothiazide (HYDRODIURIL) 25 MG tablet        Discharge Exam:  Blood pressure 97/59, pulse 71, temperature 98 F (36.7 C), temperature source Oral, resp. rate 18, height 5\' 7"  (1.702 m), weight 157.3 kg (346 lb 12.5 oz), SpO2 97.00%. NAD CTA B S1, S2, RRR Soft, obese, NT, Bs+ B/l LE lymphedema, stable  Disposition and Follow-up:  Follow up with Dr. Allyson Sabal from [facility] heart and vascular, follow up with Dr. Renae Gloss in 1-2 weeks.  Consults: Cardiology, [facility] heart and vascular   Significant Diagnostic Studies:  Dg Chest 2 View  09/29/2011  *RADIOLOGY REPORT*  Clinical Data: Shortness of breath.  CHEST - 2 VIEW  Comparison: PA and lateral chest 09/19/2011.  Findings: Right pleural effusions seen on the comparison study appears decreased in size.  There is associated compressive atelectasis in the right base.  Cardiomegaly and pulmonary vascular congestion are noted.  IMPRESSION:  1.  Decreased right pleural effusion. 2.  Cardiomegaly vascular congestion.  Original Report Authenticated By: Bernadene Bell. D'ALESSIO, M.D.    Brief H and P: For complete details please refer to admission H and P, but in brief Patient is a 76 year old Afro-American female past medical history of diastolic heart failure, pulmonary embolism diagnosed in October of this year,  lymphedema and hypertension who comes in complaining of shortness of breath and weakness. The patient was under the hospitalist service approximately a little under 2 months ago and at that time was diagnosed with a pulmonary embolus of likely probability. In her workup, she had an echocardiogram done which noted grade 1 diastolic heart failure. Patient was discharged to a short-term skilled nursing facility and from there went home. Since that time she has had episodes of shortness of breath and generalized weakness. She she came into the ER 10  days ago for evaluation. At that time she was noted to have a leukocytosis with a white count of 13. Her INR was minimally elevated above normal. Her oxygen saturations and other workup was fine, and it was felt that likely this was some anxiety component along with deconditioning and her leukocytosis may be from her bronchitis. She was given antibiotics and sent back home.  Since that time, patient has not improved. She still complains of shortness of breath, worse when she says she exerts herself. She denies any chest pain. Overall she says she feels quite weak and run down and cannot get around very much now. She came in with her son today who she lives with for further evaluation and treatment. On lab work she was noted to have a much elevated INR at 5.6 and a chest x-ray denoting cardiomegaly and vascular congestion but decreased right pleural effusion. The rest of her labs noted a chronic stable kidney disease with creatinine at 1.4. It was noted her oxygen saturations were stable from 96-98% on room air. Hospitals were called for further evaluation.     Hospital Course:  Principal Problem:  *Acute diastolic congestive heart failure.  Patient was seen by cardiology.  She was started on diuresis, and her respiratory status has significantly improved.  She is breathing comfortably on room air at rest.  She is also able to ambulate short distances, but becomes short of breath when she attempts longer distances.  This is likely multifactorial due to her weight, deconditioning and overall functional status.  She appears to be near her baseline.  She was seen by physical therapy and was recommended to resume home health physical therapy. Active Problems:  Pulmonary embolism, Oct 2012, she is anticoagulated with coumadin  Chronic kidney disease, stage II (mild), stable  Hypertension, stable  GERD (gastroesophageal reflux disease)  Arthritis, on high dose steroids since 12/16 for toe pain, patient was  started on steroids and has had symptomatic improvement.  Her prednisone is being tapered.  Morbid obesity  Coagulopathy, supratheraputic INR  Lymphedema, chronic.  Diabetes mellitus, diet controlled   Time spent on Discharge:  Signed: Merwin Breden [facility]  10/06/2011, 8:45 AM

## 2011-10-06 NOTE — Progress Notes (Addendum)
Cm spoke with pt concerning CM consult per MD to resume [facility] services for HHPT/OT/RN/HHA. Per pt previously active with [facility]. Patient to discharge to adult son's residence. Cm to contact homehealth agency to alert agency of pt discharge from hospital. Awaiting HH orders from physician.  Leonie Green, Weekend Case Manager [phone]

## 2011-10-06 NOTE — Progress Notes (Signed)
ANTICOAGULATION CONSULT NOTE - Follow Up Consult  Pharmacy Consult for Coumadin Indication: Hx PE 07/2011  No Known Allergies  Patient Measurements: Height: 5\' 7"  (170.2 cm) Weight: 346 lb 12.5 oz (157.3 kg) IBW/kg (Calculated) : 61.6    Vital Signs: Temp: 98 F (36.7 C) (12/22 0555) Temp src: Oral (12/22 0555) BP: 143/70 mmHg (12/22 1116) Pulse Rate: 80  (12/22 1116)  Labs:  Basename 10/06/11 0521 10/05/11 0437 10/04/11 0415  HGB -- -- 9.6*  HCT -- -- 30.2*  PLT -- -- 415*  APTT -- -- --  LABPROT 24.9* 29.3* 31.9*  INR 2.21* 2.72* 3.03*  HEPARINUNFRC -- -- --  CREATININE 1.29* 1.30* 1.35*  CKTOTAL -- -- --  CKMB -- -- --  TROPONINI -- -- --   Estimated Creatinine Clearance: 56.7 ml/min (by C-G formula based on Cr of 1.29).   Medications:  Scheduled:    . amLODipine  10 mg Oral Daily  . ezetimibe  10 mg Oral Daily  . furosemide  40 mg Oral BID  . metoprolol succinate  25 mg Oral Daily  . predniSONE  40 mg Oral Q breakfast  . sodium chloride  3 mL Intravenous Q12H  . warfarin  2 mg Oral ONCE-1800    Assessment: 77 yo F on Coumadin PTA for hx PE in 07/2011. Hepatic dysfx/congestion. INR subtherapeutic, trending down after being supratx on admission (doses were held 12/15 -19). Has received 2mg  last 2 days in a row. Dose PTA is 6mg  MWF and 4mg  other days. Patient on prednisone which may increase risk of bleeding w/Coumadin. No bleeding reported. Noted D/C pending (Awaiting HH orders).   Goal of Therapy:  INR 2-3   Plan:  Coumadin 4mg  today as per home regimen (if pt still here). F/U AM INR if pt not d/c.  Gwen Her PharmD  [phone] 10/06/2011 1:21 PM

## 2011-10-19 ENCOUNTER — Encounter (HOSPITAL_COMMUNITY): Payer: Self-pay | Admitting: Emergency Medicine

## 2011-10-19 ENCOUNTER — Inpatient Hospital Stay (HOSPITAL_COMMUNITY)
Admission: EM | Admit: 2011-10-19 | Discharge: 2011-10-24 | DRG: 939 | Disposition: A | Discharge status: Home Health | Payer: Medicare Other | Attending: Internal Medicine | Admitting: Internal Medicine

## 2011-10-19 DIAGNOSIS — R319 Hematuria, unspecified: Secondary | ICD-10-CM | POA: Diagnosis present

## 2011-10-19 DIAGNOSIS — C55 Malignant neoplasm of uterus, part unspecified: Secondary | ICD-10-CM | POA: Diagnosis present

## 2011-10-19 DIAGNOSIS — N189 Chronic kidney disease, unspecified: Secondary | ICD-10-CM | POA: Diagnosis present

## 2011-10-19 DIAGNOSIS — I5032 Chronic diastolic (congestive) heart failure: Secondary | ICD-10-CM | POA: Diagnosis present

## 2011-10-19 DIAGNOSIS — E119 Type 2 diabetes mellitus without complications: Secondary | ICD-10-CM | POA: Diagnosis present

## 2011-10-19 DIAGNOSIS — J189 Pneumonia, unspecified organism: Secondary | ICD-10-CM | POA: Diagnosis present

## 2011-10-19 DIAGNOSIS — D689 Coagulation defect, unspecified: Secondary | ICD-10-CM | POA: Diagnosis present

## 2011-10-19 DIAGNOSIS — N179 Acute kidney failure, unspecified: Secondary | ICD-10-CM | POA: Diagnosis present

## 2011-10-19 DIAGNOSIS — N390 Urinary tract infection, site not specified: Secondary | ICD-10-CM | POA: Diagnosis present

## 2011-10-19 DIAGNOSIS — I509 Heart failure, unspecified: Secondary | ICD-10-CM | POA: Diagnosis present

## 2011-10-19 DIAGNOSIS — IMO0001 Reserved for inherently not codable concepts without codable children: Secondary | ICD-10-CM | POA: Diagnosis present

## 2011-10-19 DIAGNOSIS — M129 Arthropathy, unspecified: Secondary | ICD-10-CM | POA: Diagnosis present

## 2011-10-19 DIAGNOSIS — I129 Hypertensive chronic kidney disease with stage 1 through stage 4 chronic kidney disease, or unspecified chronic kidney disease: Secondary | ICD-10-CM | POA: Diagnosis present

## 2011-10-19 DIAGNOSIS — T45515A Adverse effect of anticoagulants, initial encounter: Secondary | ICD-10-CM | POA: Diagnosis present

## 2011-10-19 DIAGNOSIS — R791 Abnormal coagulation profile: Secondary | ICD-10-CM

## 2011-10-19 DIAGNOSIS — Z7901 Long term (current) use of anticoagulants: Secondary | ICD-10-CM

## 2011-10-19 DIAGNOSIS — A498 Other bacterial infections of unspecified site: Secondary | ICD-10-CM | POA: Diagnosis present

## 2011-10-19 DIAGNOSIS — R310 Gross hematuria: Secondary | ICD-10-CM | POA: Diagnosis present

## 2011-10-19 DIAGNOSIS — D62 Acute posthemorrhagic anemia: Secondary | ICD-10-CM | POA: Diagnosis present

## 2011-10-19 DIAGNOSIS — K219 Gastro-esophageal reflux disease without esophagitis: Secondary | ICD-10-CM | POA: Diagnosis present

## 2011-10-19 DIAGNOSIS — Z86711 Personal history of pulmonary embolism: Secondary | ICD-10-CM

## 2011-10-19 LAB — BASIC METABOLIC PANEL
BUN: 38 mg/dL — ABNORMAL HIGH (ref 6–23)
CO2: 28 mEq/L (ref 19–32)
Calcium: 9 mg/dL (ref 8.4–10.5)
Chloride: 94 mEq/L — ABNORMAL LOW (ref 96–112)
Creatinine, Ser: 2.05 mg/dL — ABNORMAL HIGH (ref 0.50–1.10)
GFR calc Af Amer: 26 mL/min — ABNORMAL LOW (ref >90)
GFR calc non Af Amer: 22 mL/min — ABNORMAL LOW (ref >90)
Glucose, Bld: 121 mg/dL — ABNORMAL HIGH (ref 70–99)
Potassium: 3.6 mEq/L (ref 3.5–5.1)
Sodium: 133 mEq/L — ABNORMAL LOW (ref 135–145)

## 2011-10-19 LAB — URINALYSIS, ROUTINE W REFLEX MICROSCOPIC
Glucose, UA: NEGATIVE mg/dL
Nitrite: NEGATIVE
Protein, ur: 30 mg/dL — AB
Specific Gravity, Urine: 1.017 (ref 1.005–1.030)
Urobilinogen, UA: 1 mg/dL (ref 0.0–1.0)
pH: 5.5 (ref 5.0–8.0)

## 2011-10-19 LAB — DIFFERENTIAL
Basophils Absolute: 0 10*3/uL (ref 0.0–0.1)
Basophils Relative: 0 % (ref 0–1)
Eosinophils Absolute: 0.3 10*3/uL (ref 0.0–0.7)
Eosinophils Relative: 3 % (ref 0–5)
Lymphocytes Relative: 11 % — ABNORMAL LOW (ref 12–46)
Lymphs Abs: 1.1 10*3/uL (ref 0.7–4.0)
Monocytes Absolute: 0.9 10*3/uL (ref 0.1–1.0)
Monocytes Relative: 8 % (ref 3–12)
Neutro Abs: 8.3 10*3/uL — ABNORMAL HIGH (ref 1.7–7.7)
Neutrophils Relative %: 78 % — ABNORMAL HIGH (ref 43–77)

## 2011-10-19 LAB — CBC
HCT: 30.9 % — ABNORMAL LOW (ref 36.0–46.0)
Hemoglobin: 10 g/dL — ABNORMAL LOW (ref 12.0–15.0)
MCH: 29.3 pg (ref 26.0–34.0)
MCHC: 32.4 g/dL (ref 30.0–36.0)
MCV: 90.6 fL (ref 78.0–100.0)
Platelets: 342 10*3/uL (ref 150–400)
RBC: 3.41 MIL/uL — ABNORMAL LOW (ref 3.87–5.11)
RDW: 14.8 % (ref 11.5–15.5)
WBC: 10.6 10*3/uL — ABNORMAL HIGH (ref 4.0–10.5)

## 2011-10-19 LAB — URINE MICROSCOPIC-ADD ON

## 2011-10-19 LAB — PROTIME-INR
INR: 5.97 (ref 0.00–1.49)
Prothrombin Time: 54.1 seconds — ABNORMAL HIGH (ref 11.6–15.2)

## 2011-10-19 NOTE — ED Notes (Signed)
Family at bedside.  Pt has no complaints.  Call bell in reach.  All are watching tv.

## 2011-10-19 NOTE — ED Notes (Signed)
Per EMS , pt states to have blood in her urine, pt also states burning when voiding. Pt c/o LUQ pain on EMS arrival , no pain at this time

## 2011-10-19 NOTE — ED Notes (Signed)
AVW:[id number]<XB> Expected date:10/19/11<BR> Expected time: 7:14 PM<BR> Means of arrival:Ambulance<BR> Comments:<BR> EMS 32 GC - hematuria

## 2011-10-19 NOTE — ED Notes (Signed)
Lab called to inform of abnormal INR 5.97. EDP notified, RN notified

## 2011-10-20 ENCOUNTER — Inpatient Hospital Stay (HOSPITAL_COMMUNITY): Payer: Medicare Other

## 2011-10-20 DIAGNOSIS — N179 Acute kidney failure, unspecified: Secondary | ICD-10-CM | POA: Diagnosis present

## 2011-10-20 DIAGNOSIS — R319 Hematuria, unspecified: Secondary | ICD-10-CM | POA: Diagnosis present

## 2011-10-20 LAB — BASIC METABOLIC PANEL
Chloride: 95 mEq/L — ABNORMAL LOW (ref 96–112)
GFR calc Af Amer: 28 mL/min — ABNORMAL LOW (ref >90)
GFR calc non Af Amer: 24 mL/min — ABNORMAL LOW (ref >90)
Potassium: 3.8 mEq/L (ref 3.5–5.1)
Sodium: 133 mEq/L — ABNORMAL LOW (ref 135–145)

## 2011-10-20 LAB — PROTIME-INR: INR: 6.42 (ref 0.00–1.49)

## 2011-10-20 LAB — CBC
MCHC: 32.7 g/dL (ref 30.0–36.0)
Platelets: 323 10*3/uL (ref 150–400)
RDW: 14.8 % (ref 11.5–15.5)
WBC: 11.6 10*3/uL — ABNORMAL HIGH (ref 4.0–10.5)

## 2011-10-20 LAB — PRO B NATRIURETIC PEPTIDE: Pro B Natriuretic peptide (BNP): 192.6 pg/mL (ref 0–450)

## 2011-10-20 MED ORDER — ONDANSETRON HCL 4 MG/2ML IJ SOLN
4.0000 mg | Freq: Four times a day (QID) | INTRAMUSCULAR | Status: DC | PRN
  Administered 2011-10-22 (×2): 4 mg via INTRAVENOUS
  Filled 2011-10-20 (×2): qty 2

## 2011-10-20 MED ORDER — DEXTROSE 5 % IV SOLN
1.0000 g | INTRAVENOUS | Status: DC
  Administered 2011-10-21 – 2011-10-24 (×4): 1 g via INTRAVENOUS
  Filled 2011-10-20 (×5): qty 10

## 2011-10-20 MED ORDER — ZOLPIDEM TARTRATE 5 MG PO TABS
5.0000 mg | ORAL_TABLET | Freq: Every evening | ORAL | Status: DC | PRN
  Administered 2011-10-20: 10 mg via ORAL
  Administered 2011-10-22 – 2011-10-23 (×2): 5 mg via ORAL
  Filled 2011-10-20: qty 2
  Filled 2011-10-20 (×2): qty 1

## 2011-10-20 MED ORDER — EZETIMIBE 10 MG PO TABS
10.0000 mg | ORAL_TABLET | Freq: Every day | ORAL | Status: DC
  Administered 2011-10-20 – 2011-10-23 (×4): 10 mg via ORAL
  Filled 2011-10-20 (×5): qty 1

## 2011-10-20 MED ORDER — METOCLOPRAMIDE HCL 10 MG PO TABS: 10.0000 mg | ORAL_TABLET | Freq: Four times a day (QID) | ORAL | Status: DC | PRN

## 2011-10-20 MED ORDER — SODIUM CHLORIDE 0.45 % IV SOLN
INTRAVENOUS | Status: DC
  Administered 2011-10-20: 14:00:00 via INTRAVENOUS
  Administered 2011-10-21: 50 mL/h via INTRAVENOUS

## 2011-10-20 MED ORDER — SODIUM CHLORIDE 0.9 % IV BOLUS (SEPSIS)
500.0000 mL | Freq: Once | INTRAVENOUS | Status: AC
  Administered 2011-10-20: 500 mL via INTRAVENOUS

## 2011-10-20 MED ORDER — DEXTROSE 5 % IV SOLN
1.0000 g | Freq: Once | INTRAVENOUS | Status: AC
  Administered 2011-10-20: 1 g via INTRAVENOUS
  Filled 2011-10-20: qty 10

## 2011-10-20 MED ORDER — ONDANSETRON HCL 4 MG PO TABS: 4.0000 mg | ORAL_TABLET | Freq: Four times a day (QID) | ORAL | Status: DC | PRN

## 2011-10-20 MED ORDER — AMLODIPINE BESYLATE 10 MG PO TABS
10.0000 mg | ORAL_TABLET | Freq: Every day | ORAL | Status: DC
  Administered 2011-10-20: 10 mg via ORAL
  Filled 2011-10-20 (×2): qty 1

## 2011-10-20 MED ORDER — SENNA 8.6 MG PO TABS
1.0000 | ORAL_TABLET | Freq: Two times a day (BID) | ORAL | Status: DC
  Administered 2011-10-20 – 2011-10-24 (×7): 8.6 mg via ORAL
  Filled 2011-10-20 (×7): qty 1

## 2011-10-20 MED ORDER — ACETAMINOPHEN 325 MG PO TABS
650.0000 mg | ORAL_TABLET | Freq: Four times a day (QID) | ORAL | Status: DC | PRN
  Administered 2011-10-22 – 2011-10-24 (×3): 650 mg via ORAL
  Filled 2011-10-20 (×4): qty 2

## 2011-10-20 MED ORDER — ACETAMINOPHEN 650 MG RE SUPP: 650.0000 mg | Freq: Four times a day (QID) | RECTAL | Status: DC | PRN

## 2011-10-20 MED ORDER — METOPROLOL SUCCINATE ER 25 MG PO TB24
25.0000 mg | ORAL_TABLET | Freq: Every day | ORAL | Status: DC
  Administered 2011-10-20 – 2011-10-24 (×4): 25 mg via ORAL
  Filled 2011-10-20 (×5): qty 1

## 2011-10-20 NOTE — ED Provider Notes (Signed)
History     CSN: [id number]  Arrival date & time 10/19/11  4098   First MD Initiated Contact with Patient 10/19/11 2007      Chief Complaint  Patient presents with  . Hematuria  . Vaginal Bleeding     Patient is a 78 y.o. female presenting with hematuria. The history is provided by the patient.  Hematuria This is a new problem. The current episode started today. The problem is unchanged. She reports no clotting in her urine stream. Her pain is at a severity of 0/10. She describes her urine color as light pink. Associated symptoms include dysuria. She is not sexually active.  Pt reports onset of blood in her urine today w/ dysuria. Pt is s/p remote TAH.  Denies pain except w/ voiding.  Pt states on Coumadin since 10/12 s/p PE during hosp for PNA. States went this past Wed to have her PT and INR drawn but staff were unable to draw her blood. She left the office that day and was not contacted w/ instructions for f/u PT/INR screening. Son also reports that pt has c/o intermittent dizziness over the last several days.  Past Medical History  Diagnosis Date  . GERD (gastroesophageal reflux disease)   . Hypertension   . Diabetes mellitus     diet controlled  . Morbid obesity   . Arthritis   . Chronic kidney disease     nephrolithiasis left kidney,s/p open resection  . Status post arthroscopic surgery of right knee   . Diverticulosis of sigmoid colon   . Chronic back pain   . Pneumonia 07/2011    hx  . Pulmonary embolism 07/2011    hx  . Uterine cancer     endometrial high grade adenocarcinoma  . Uterus cancer, sarcoma     high grade invasive carcinosarcoma    Past Surgical History  Procedure Date  . Abdominal hysterectomy     total with b/l salpingo-oopherectomy  . Colonoscopy     negative  . Knee arthroscopy     right knee    Family History  Problem Relation Age of Onset  . Breast cancer Sister     1st sister  . Colon cancer Sister     2nd sister  . Cervical cancer  Sister     3rd sister    History  Substance Use Topics  . Smoking status: Never Smoker   . Smokeless tobacco: Not on file  . Alcohol Use: No    OB History    Grav Para Term Preterm Abortions TAB SAB Ect Mult Living   9 5              Review of Systems  Constitutional: Negative.   HENT: Negative.   Eyes: Negative.   Respiratory: Negative.   Cardiovascular: Negative.   Gastrointestinal: Negative.   Genitourinary: Positive for dysuria and hematuria.  Musculoskeletal: Negative.   Skin: Negative.   Neurological: Positive for dizziness.  Hematological: Negative.   Psychiatric/Behavioral: Negative.     Allergies  Review of patient's allergies indicates no known allergies.  Home Medications   Current Outpatient Rx  Name Route Sig Dispense Refill  . AMLODIPINE BESYLATE 10 MG PO TABS Oral Take 10 mg by mouth daily.     [name] EZETIMIBE 10 MG PO TABS Oral Take 10 mg by mouth daily.     . FUROSEMIDE 40 MG PO TABS Oral Take 1 tablet (40 mg total) by mouth 2 (two) times daily. 60 tablet 1  .  HYDROCODONE-ACETAMINOPHEN 10-325 MG PO TABS Oral Take 1 tablet by mouth every 6 (six) hours as needed. For pain.     [name] METOCLOPRAMIDE HCL 10 MG PO TABS Oral Take 10 mg by mouth 4 (four) times daily as needed. For nausea. Patient states that she takes once a day as needed.    [name] METOPROLOL SUCCINATE ER 25 MG PO TB24 Oral Take 25 mg by mouth daily.      . WARFARIN SODIUM 4 MG PO TABS Oral Take 4 mg by mouth daily.     [name] ZOLPIDEM TARTRATE 5 MG PO TABS Oral Take 5-10 mg by mouth at bedtime as needed. For sleep.       BP 114/52  Pulse 86  Temp(Src) 99 F (37.2 C) (Oral)  Resp 17  Ht 5\' 6"  (1.676 m)  Wt 324 lb (146.965 kg)  BMI 52.29 kg/m2  SpO2 100%  Physical Exam  Constitutional: She is oriented to person, place, and time. She appears well-developed and well-nourished. She is cooperative.       Morbidly obese  HENT:  Head: Normocephalic and atraumatic.  Eyes: Conjunctivae are normal.    Neck: Neck supple.  Cardiovascular: Normal rate and regular rhythm.   Pulmonary/Chest: Effort normal and breath sounds normal.  Abdominal: Soft. Bowel sounds are normal.  Musculoskeletal: Normal range of motion.  Neurological: She is alert and oriented to person, place, and time.  Skin: Skin is warm and dry. No erythema.  Psychiatric: She has a normal mood and affect.    ED Course  Procedures Findings and clinical impression discussed w/ pt. Discussed need for admission given elevated PT/INR and UTI. Pt agreeable w/ plan. Will consult w/ [facility] regarding admission.  I have spoken w/ [facility] who has agreed to to evaluate pt for admission.  Labs Reviewed  URINALYSIS, ROUTINE W REFLEX MICROSCOPIC - Abnormal; Notable for the following:    Color, Urine AMBER (*) BIOCHEMICALS MAY BE AFFECTED BY COLOR   APPearance TURBID (*)    Hgb urine dipstick LARGE (*)    Bilirubin Urine SMALL (*)    Ketones, ur TRACE (*)    Protein, ur 30 (*)    Leukocytes, UA LARGE (*)    All other components within normal limits  CBC - Abnormal; Notable for the following:    WBC 10.6 (*)    RBC 3.41 (*)    Hemoglobin 10.0 (*)    HCT 30.9 (*)    All other components within normal limits  DIFFERENTIAL - Abnormal; Notable for the following:    Neutrophils Relative 78 (*)    Neutro Abs 8.3 (*)    Lymphocytes Relative 11 (*)    All other components within normal limits  BASIC METABOLIC PANEL - Abnormal; Notable for the following:    Sodium 133 (*)    Chloride 94 (*)    Glucose, Bld 121 (*)    BUN 38 (*)    Creatinine, Ser 2.05 (*)    GFR calc non Af Amer 22 (*)    GFR calc Af Amer 26 (*)    All other components within normal limits  PROTIME-INR - Abnormal; Notable for the following:    Prothrombin Time 54.1 (*)    INR 5.97 (*)    All other components within normal limits  URINE MICROSCOPIC-ADD ON - Abnormal; Notable for the following:    Squamous Epithelial / LPF FEW (*)     Bacteria, UA MANY (*)    All other components  within normal limits   No results found.   1. Urinary tract infection, acute   2. Elevated INR       MDM  Elevated PT/INR UTI w/ hematuria Intermittent dizziness        Leanne Chang, NP 10/21/11 1738

## 2011-10-20 NOTE — H&P (Signed)
Primary Care Physician: Andi Devon, MD  Chief Complaint: Hematuria  History of Present Illness: Patient is a 78 year old woman history of hypertension, chronic kidney disease, morbid obesity with lymphedema, PE in 07/2011 currently on Coumadin, recently admitted for heart failure who presents for evaluation of hematuria. The patient reports that she was in her usual state of health until earlier today when she noted gross hematuria upon micturition. Reports mild associated dysuria at the end of the stream. No associated fevers or chills. No nausea or vomiting. INR has been fluctuating, supratherapeutic on most recent admission with patient discharged on 10/06/2011 for heart failure exacerbation. The patient reports that she has been taking her medications as directed since the discharge with no changes in the interim. In addition, the son taking care of the patient does report that the patient has been having a worsening cough recently, which was the presenting symptom for the heart failure exacerbation the last time. She also has been having problems with lightheadedness upon ambulation, with little to no urine usually with urination.  In the emergency room, temperature 99.0, blood pressure 101/51, heart rate 92, respirations 18, satting 98% on room air. IRN 5.97. Hemoglobin noted to be stable at approximately 10.0. Creatinine 2.05 from 1.3 at discharge from last admission. Urinalysis grossly bloody with large leukocytes and many bacteria. The patient was started on ceftriaxone and admitted for further evaluation and treatment.  Past Medical History  Diagnosis Date  . GERD (gastroesophageal reflux disease)   . Hypertension   . Diabetes mellitus     diet controlled  . Morbid obesity   . Arthritis   . Chronic kidney disease     nephrolithiasis left kidney,s/p open resection  . Status post arthroscopic surgery of right knee   . Diverticulosis of sigmoid colon   . Chronic back pain   .  Pneumonia 07/2011    hx  . Pulmonary embolism 07/2011    hx  . Uterine cancer     endometrial high grade adenocarcinoma  . Uterus cancer, sarcoma     high grade invasive carcinosarcoma    Past Surgical History  Procedure Date  . Abdominal hysterectomy     total with b/l salpingo-oopherectomy  . Colonoscopy     negative  . Knee arthroscopy     right knee    Allergies   Review of patient's allergies indicates no known allergies.  Home Medications    Current Outpatient Rx   Name  Route  Sig  Dispense  Refill   .  AMLODIPINE BESYLATE 10 MG PO TABS  Oral  Take 10 mg by mouth daily.     [name]  EZETIMIBE 10 MG PO TABS  Oral  Take 10 mg by mouth daily.     .  FUROSEMIDE 40 MG PO TABS  Oral  Take 1 tablet (40 mg total) by mouth 2 (two) times daily.  60 tablet  1   .  HYDROCODONE-ACETAMINOPHEN 10-325 MG PO TABS  Oral  Take 1 tablet by mouth every 6 (six) hours as needed. For pain.     [name]  METOCLOPRAMIDE HCL 10 MG PO TABS  Oral  Take 10 mg by mouth 4 (four) times daily as needed. For nausea. Patient states that she takes once a day as needed.     [name]  METOPROLOL SUCCINATE ER 25 MG PO TB24  Oral  Take 25 mg by mouth daily.     .  WARFARIN SODIUM 4 MG PO TABS  Oral  Take 4 mg by mouth daily.     [name]  ZOLPIDEM TARTRATE 5 MG PO TABS  Oral  Take 5-10 mg by mouth at bedtime as needed. For sleep.       Social History:  reports that she has never smoked. She does not have any smokeless tobacco history on file. She reports that she does not drink alcohol or use illicit drugs. the patient lives at home with her son and his family. She does ambulate but says that she is having a harder time because of shortness of breath. She normally does not use a walker.    Family History:  Family History   Problem  Relation  Age of Onset   .  Breast cancer  Sister       1st sister    .  Colon cancer  Sister       2nd sister    .  Cervical cancer  Sister       3rd sister      Review of Systems: General:  As per history of present illness Skin: No rashes or lacerations HEENT: No rhinorrhea, sore throat, dry mouth, hearing difficulties Pulmonary: No cough, wheezing, shortness of breath Cardivascular: As per history of present illness Gastrointestinal: No abdominal pain, dysphagia, odynophagia, nausea, vomiting, hematemesis, melena, hematochezia, bowel changes Genitourinary: As per history of present illness Musculoskeletal: No muscle aches, pain. No arthritis Hematologic: No easy bruising or bleeding Neurologic: No headaches, vision changes, focal neurologic deficits Psychologic: No suicidial or homicidal ideation. No depression  Filed Vitals:   10/19/11 2214 10/19/11 2230 10/19/11 2300 10/20/11 0104  BP: 106/52 105/55 114/52 92/34  Pulse:  82 86 88  Temp:    98.4 F (36.9 C)  TempSrc:    Oral  Resp: 18 18 17    Height:      Weight:      SpO2: 94% 100% 100% 100%    Physical Exam: General: Alert and oriented x 3, no apparent distress Skin: No rashes, bruises HEENT: Head atraumatic, sclera anicertic, pupils equal and reactive to light, oropharynx moist with tonsils unremarkable Neck: Soft, no lymphadenopathy, thyromegaly, or bruits Chest: Clear to auscultation bilaterally, no wheezes, rales, or ronchi Heart: Regular rate and rhythm, grade 3/6 holosystolic murmur Abdomen: Soft, nontender, obese, + bowel sounds, no masses Extremities: Marked bilateral lower extremity lymphedema. Neurologic: Grossly intact   Labs: CBC    Component Value Date/Time   WBC 10.6* 10/19/2011 2101   WBC 14.6* 03/13/2011 1558   WBC 14.6* 03/13/2011 1558   RBC 3.41* 10/19/2011 2101   RBC 3.95 03/13/2011 1558   RBC 3.95 03/13/2011 1558   HGB 10.0* 10/19/2011 2101   HGB 12.6 03/13/2011 1558   HGB 12.6 03/13/2011 1558   HCT 30.9* 10/19/2011 2101   HCT 37.3 03/13/2011 1558   HCT 37.3 03/13/2011 1558   PLT 342 10/19/2011 2101   PLT 269 03/13/2011 1558   PLT 269 03/13/2011 1558   MCV 90.6 10/19/2011 2101   MCV 94.6  03/13/2011 1558   MCV 94.6 03/13/2011 1558   MCH 29.3 10/19/2011 2101   MCH 32.0 03/13/2011 1558   MCH 32.0 03/13/2011 1558   MCHC 32.4 10/19/2011 2101   MCHC 33.8 03/13/2011 1558   MCHC 33.8 03/13/2011 1558   RDW 14.8 10/19/2011 2101   RDW 14.4 03/13/2011 1558   RDW 14.4 03/13/2011 1558   LYMPHSABS 1.1 10/19/2011 2101   LYMPHSABS 0.7* 03/13/2011 1558   LYMPHSABS 0.7* 03/13/2011 1558   MONOABS  0.9 10/19/2011 2101   MONOABS 0.1 03/13/2011 1558   MONOABS 0.1 03/13/2011 1558   EOSABS 0.3 10/19/2011 2101   EOSABS 0.0 03/13/2011 1558   EOSABS 0.0 03/13/2011 1558   BASOSABS 0.0 10/19/2011 2101   BASOSABS 0.0 03/13/2011 1558   BASOSABS 0.0 03/13/2011 1558    BMET    Component Value Date/Time   NA 133* 10/19/2011 2101   K 3.6 10/19/2011 2101   CL 94* 10/19/2011 2101   CO2 28 10/19/2011 2101   GLUCOSE 121* 10/19/2011 2101   BUN 38* 10/19/2011 2101   CREATININE 2.05* 10/19/2011 2101   CALCIUM 9.0 10/19/2011 2101   GFRNONAA 22* 10/19/2011 2101   GFRAA 26* 10/19/2011 2101    INR 5.97  Urinalysis: Amber, turbid, specific gravity 1.017, frank blood with large leukocytes, many bacteria, and few squamous cells.  Impression/Plan: 78 year old woman history of hypertension, chronic kidney disease, morbid obesity with lymphedema, PE in 07/2011 currently on Coumadin, recently admitted for heart failure who presents for evaluation of hematuria and dysuria in the setting of a supratherapeutic INR currently afebrile and hemodynamically stable although blood pressures are lower limits of normal.   Hematuria: Likely combination of supratherapeutic INR and probable cystitis given dysuria and urinalysis. - Admit to medicine - Continue ceftriaxone - Attempt to obtain urine culture - Foley catheter to assure patency of the bladder - Continue to monitor INR, with no reversal given indication  Supratherapeutic INR: Unclear etiology, given diet has not changed in patient has been taking medications as directed - Continue to monitor daily,  no reversal given indication for pulmonary embolism - Nutrition consult  Acute on chronic renal failure:  Likely related to prerenal etiology given history of dizziness and lower blood pressures - Gentle IV hydration given heart failure exacerbation - Hold home diuretics - Continue to monitor daily, renally dosing medications  Cough at home: - Check BNP in the morning given concern - Continue home cardiac medications, monitoring closely with dehydration  Hypertension: Blood pressure currently on the lower limits of normal - Hold on diuretics  Fluid/electrolytes/nutrition: - Bolus fluids as needed - Monitor electrolytes daily - Cardiac diet  Prophylaxis: - Supratherapeutic INR  CODE STATUS: Full code

## 2011-10-20 NOTE — Progress Notes (Signed)
Christine [name] is a pleasant  78 y.o. female patient admitted with gross hematuria in setting of supratherapeutic inr. She had foley placed yesterday and her urine has cleared up. INR remains high. She says she did not have the INR checked since discharge from the hospital on the 22nd of december.  SUBJECTIVE "I feel ok".   1. Urinary tract infection, acute   2. Elevated INR     Past Medical History  Diagnosis Date  . GERD (gastroesophageal reflux disease)   . Hypertension   . Diabetes mellitus     diet controlled  . Morbid obesity   . Arthritis   . Chronic kidney disease     nephrolithiasis left kidney,s/p open resection  . Status post arthroscopic surgery of right knee   . Diverticulosis of sigmoid colon   . Chronic back pain   . Pneumonia 07/2011    hx  . Pulmonary embolism 07/2011    hx  . Uterine cancer     endometrial high grade adenocarcinoma  . Uterus cancer, sarcoma     high grade invasive carcinosarcoma   Current Facility-Administered Medications  Medication Dose Route Frequency Provider Last Rate Last Dose  . 0.45 % sodium chloride infusion   Intravenous Continuous [place] Ye[place]      . acetaminophen (TYLENOL) tablet 650 mg  650 mg Oral Q6H PRN Suella Grove       Or  . acetaminophen (TYLENOL) suppository 650 mg  650 mg Rectal Q6H PRN Suella Grove      . amLODipine (NORVASC) tablet 10 mg  10 mg Oral Daily Andy Chon   10 mg at 10/20/11 0932  . cefTRIAXone (ROCEPHIN) 1 g in dextrose 5 % 50 mL IVPB  1 g Intravenous Once Leanne Chang, NP   1 g at 10/20/11 0155  . cefTRIAXone (ROCEPHIN) 1 g in dextrose 5 % 50 mL IVPB  1 g Intravenous Q24H Leann Trefz Poindexter, PHARMD      . ezetimibe (ZETIA) tablet 10 mg  10 mg Oral Daily Yahoo      . metoCLOPramide (REGLAN) tablet 10 mg  10 mg Oral Q6H PRN Suella Grove      . metoprolol succinate (TOPROL-XL) 24 hr tablet 25 mg  25 mg Oral Daily Andy Chon   25 mg at 10/20/11 0932  . ondansetron (ZOFRAN) tablet 4 mg  4 mg Oral Q6H  PRN Suella Grove       Or  . ondansetron [facility]) injection 4 mg  4 mg Intravenous Q6H PRN Suella Grove      . senna (SENOKOT) tablet 8.6 mg  1 tablet Oral BID Andy Chon   8.6 mg at 10/20/11 0932  . sodium chloride 0.9 % bolus 500 mL  500 mL Intravenous Once Yahoo   500 mL at 10/20/11 1610  . zolpidem (AMBIEN) tablet 5-10 mg  5-10 mg Oral QHS PRN Suella Grove       No Known Allergies Principal Problem:  *Hematuria Active Problems:  Coagulopathy, supratheraputic INR  Acute on chronic renal failure   Vital signs in last 24 hours: Temp:  [98 F (36.7 C)-99 F (37.2 C)] 98.5 F (36.9 C) (01/05 9604) Pulse Rate:  [82-97] 86  (01/05 0638) Resp:  [16-20] 20  (01/05 5409) BP: (92-119)/(34-60) 119/58 mmHg (01/05 0638) SpO2:  [94 %-100 %] 99 % (01/05 8119) Weight:  [145.605 kg (321 lb)-146.965 kg (324 lb)] 321 lb (145.605 kg) (01/05 0344) Weight change:  Last BM Date: 10/20/11  Intake/Output from previous day: 01/04 0701 - 01/05 0700 In: 240 [P.O.:240] Out: -  Intake/Output this shift:    Lab Results:  Basename 10/20/11 0835 10/19/11 2101  WBC 11.6* 10.6*  HGB 10.1* 10.0*  HCT 30.9* 30.9*  PLT 323 342   BMET  Basename 10/20/11 0835 10/19/11 2101  NA 133* 133*  K 3.8 3.6  CL 95* 94*  CO2 30 28  GLUCOSE 113* 121*  BUN 36* 38*  CREATININE 1.93* 2.05*  CALCIUM 8.9 9.0    Studies/Results: No results found.  Medications: I have reviewed the patient's current medications.   Physical exam GENERAL- alert HEAD- normal atraumatic, no neck masses, normal thyroid, no jvd RESPIRATORY- appears well, vitals normal, no respiratory distress, acyanotic, normal RR, ear and throat exam is normal, neck free of mass or lymphadenopathy, chest clear, no wheezing, crepitations, rhonchi, normal symmetric air entry CVS- regular rate and rhythm, S1, S2 normal, no murmur, click, rub or gallop ABDOMEN- abdomen is soft without significant tenderness, masses, organomegaly or guarding NEURO-  Grossly normal EXTREMITIES- extremities normal, atraumatic, no cyanosis or edema  Plan   *Hematuria- likely secondary to supratherapeutic inr. Obtain act abdomen/pelvis, r/o bladder mass. May need urology, depending on ct findings. If no mass, could resume coumadin once inr in target range.  * Coagulopathy, supratheraputic INR/recent pe- slight bump today but urine clear. May need ivc filter if urinary bladder mass on ct. Monitor off coumadin. Won't reverse coagulopathy as hemoglobin stable and recent pe.  *Acute on chronic renal failure- some improvement- ?dehydration. ?post renal. Check ct.  *Uti- on ceftriaxone. Follow urine culture.  * diastolic chf- compensated.  *diet controlled dm2.    Kenai Fluegel 10/20/2011 1:47 PM Pager: [phone].

## 2011-10-20 NOTE — Progress Notes (Signed)
ANTIBIOTIC CONSULT NOTE - INITIAL  Pharmacy Consult for Rocephin Indication: Cystitis  No Known Allergies  Patient Measurements: Height: 5\' 6"  (167.6 cm) Weight: 321 lb (145.605 kg) IBW/kg (Calculated) : 59.3     Vital Signs: Temp: 98.8 F (37.1 C) (01/05 0344) Temp src: Oral (01/05 0344) BP: 106/60 mmHg (01/05 0344) Pulse Rate: 92  (01/05 0344) Intake/Output from previous day:   Intake/Output from this shift:    Labs:  Basename 10/19/11 2101  WBC 10.6*  HGB 10.0*  PLT 342  LABCREA --  CREATININE 2.05*   Estimated Creatinine Clearance: 33.5 ml/min (by C-G formula based on Cr of 2.05). No results found for this basename: VANCOTROUGH:2,VANCOPEAK:2,VANCORANDOM:2,GENTTROUGH:2,GENTPEAK:2,GENTRANDOM:2,TOBRATROUGH:2,TOBRAPEAK:2,TOBRARND:2,AMIKACINPEAK:2,AMIKACINTROU:2,AMIKACIN:2, in the last 72 hours   Microbiology: No results found for this or any previous visit (from the past 720 hour(s)).  Medical History: Past Medical History  Diagnosis Date  . GERD (gastroesophageal reflux disease)   . Hypertension   . Diabetes mellitus     diet controlled  . Morbid obesity   . Arthritis   . Chronic kidney disease     nephrolithiasis left kidney,s/p open resection  . Status post arthroscopic surgery of right knee   . Diverticulosis of sigmoid colon   . Chronic back pain   . Pneumonia 07/2011    hx  . Pulmonary embolism 07/2011    hx  . Uterine cancer     endometrial high grade adenocarcinoma  . Uterus cancer, sarcoma     high grade invasive carcinosarcoma    Medications:  Scheduled:    . amLODipine  10 mg Oral Daily  . cefTRIAXone (ROCEPHIN)  IV  1 g Intravenous Once  . ezetimibe  10 mg Oral Daily  . metoprolol succinate  25 mg Oral Daily  . senna  1 tablet Oral BID  . sodium chloride  500 mL Intravenous Once   Infusions:   Assessment: 78 year old female with hematuria.  IV Rocephin to begin for probable cystitis.  CrCl - 33.5 ml/min.  Rocephin needs no  adjustment for renal dysfunction   Plan:  Rocephin 1gm IV q24h for probable cystitis.  Will sign-off this consult, as Rocephin dose needs no adjustment.  Thank you,  Terrilee Files, PharmD 10/20/2011,5:10 AM

## 2011-10-20 NOTE — ED Notes (Signed)
Transferred from the main ed to TCU #[id number]

## 2011-10-20 NOTE — ED Notes (Signed)
Patient tfrd to floor

## 2011-10-21 LAB — BASIC METABOLIC PANEL
BUN: 41 mg/dL — ABNORMAL HIGH (ref 6–23)
CO2: 26 mEq/L (ref 19–32)
Calcium: 8.7 mg/dL (ref 8.4–10.5)
Chloride: 94 mEq/L — ABNORMAL LOW (ref 96–112)
Creatinine, Ser: 2.41 mg/dL — ABNORMAL HIGH (ref 0.50–1.10)

## 2011-10-21 LAB — CBC
HCT: 29.3 % — ABNORMAL LOW (ref 36.0–46.0)
MCH: 29.4 pg (ref 26.0–34.0)
MCV: 90.7 fL (ref 78.0–100.0)
Platelets: 328 10*3/uL (ref 150–400)
RDW: 15 % (ref 11.5–15.5)

## 2011-10-21 LAB — PRO B NATRIURETIC PEPTIDE: Pro B Natriuretic peptide (BNP): 161.6 pg/mL (ref 0–450)

## 2011-10-21 LAB — MAGNESIUM: Magnesium: 2 mg/dL (ref 1.5–2.5)

## 2011-10-21 MED ORDER — AZITHROMYCIN 250 MG PO TABS
250.0000 mg | ORAL_TABLET | Freq: Every day | ORAL | Status: DC
  Administered 2011-10-22 – 2011-10-24 (×3): 250 mg via ORAL
  Filled 2011-10-21 (×3): qty 1

## 2011-10-21 MED ORDER — SODIUM CHLORIDE 0.9 % IV BOLUS (SEPSIS)
500.0000 mL | Freq: Once | INTRAVENOUS | Status: AC
  Administered 2011-10-21: 500 mL via INTRAVENOUS

## 2011-10-21 MED ORDER — CALCIUM GLUCONATE 500 MG PO TABS
1.0000 | ORAL_TABLET | Freq: Once | ORAL | Status: DC
  Filled 2011-10-21: qty 1

## 2011-10-21 MED ORDER — CALCIUM CARBONATE 1250 (500 CA) MG PO TABS
1.0000 | ORAL_TABLET | Freq: Once | ORAL | Status: AC
  Administered 2011-10-21: 500 mg via ORAL
  Filled 2011-10-21: qty 1

## 2011-10-21 MED ORDER — AZITHROMYCIN 500 MG PO TABS
500.0000 mg | ORAL_TABLET | Freq: Every day | ORAL | Status: AC
  Administered 2011-10-21: 500 mg via ORAL
  Filled 2011-10-21: qty 1

## 2011-10-21 NOTE — Progress Notes (Signed)
10/21/2011, 1430 nursing , DR. Venetia Constable NOTIFIED ABOUT HEMATURIA, ORDERS RECEIVED, Brainard Highfill ,RN

## 2011-10-21 NOTE — Progress Notes (Signed)
CRITICAL VALUE ALERT  Critical value received:  PT/INR 5.28 and 49.2  Date of notification:  10/21/11  Time of notification:  0541  Critical value read back yes  Nurse who received alert: Hedda Slade RN  MD notified (1st page):  Burnadette Peter  Time of first page:  0552  MD notified (2nd page):  Time of second page:  Responding MD:  Burnadette Peter  Time MD responded:  [phone]

## 2011-10-21 NOTE — Progress Notes (Signed)
10/21/2011, 1000, nursing, held norvasc and metoprolol this am due to low bp 88/53, hr 99, Suezette Lafave ,rn

## 2011-10-21 NOTE — Progress Notes (Signed)
Some gross hematuria today, bp 80s-90s systolic today.  SUBJECTIVE Denies dizziness.   1. Urinary tract infection, acute   2. Elevated INR     Past Medical History  Diagnosis Date  . GERD (gastroesophageal reflux disease)   . Hypertension   . Diabetes mellitus     diet controlled  . Morbid obesity   . Arthritis   . Chronic kidney disease     nephrolithiasis left kidney,s/p open resection  . Status post arthroscopic surgery of right knee   . Diverticulosis of sigmoid colon   . Chronic back pain   . Pneumonia 07/2011    hx  . Pulmonary embolism 07/2011    hx  . Uterine cancer     endometrial high grade adenocarcinoma  . Uterus cancer, sarcoma     high grade invasive carcinosarcoma   Current Facility-Administered Medications  Medication Dose Route Frequency Provider Last Rate Last Dose  . 0.45 % sodium chloride infusion   Intravenous Continuous Durrell Barajas 50 mL/hr at 10/21/11 0955 50 mL/hr at 10/21/11 0955  . acetaminophen (TYLENOL) tablet 650 mg  650 mg Oral Q6H PRN Suella Grove       Or  . acetaminophen (TYLENOL) suppository 650 mg  650 mg Rectal Q6H PRN Suella Grove      . azithromycin [facility]) tablet 500 mg  500 mg Oral Daily Jonae Renshaw   500 mg at 10/21/11 1525   Followed by  . azithromycin (ZITHROMAX) tablet 250 mg  250 mg Oral Daily Lyah Millirons      . calcium gluconate tablet 500 mg  1 tablet Oral Once Kuron Docken      . cefTRIAXone (ROCEPHIN) 1 g in dextrose 5 % 50 mL IVPB  1 g Intravenous Q24H Leann Trefz Poindexter, PHARMD   1 g at 10/21/11 0253  . ezetimibe (ZETIA) tablet 10 mg  10 mg Oral Daily Andy Chon   10 mg at 10/20/11 1815  . metoCLOPramide (REGLAN) tablet 10 mg  10 mg Oral Q6H PRN Suella Grove      . metoprolol succinate (TOPROL-XL) 24 hr tablet 25 mg  25 mg Oral Daily Andy Chon   25 mg at 10/20/11 0932  . ondansetron (ZOFRAN) tablet 4 mg  4 mg Oral Q6H PRN Suella Grove       Or  . ondansetron [facility]) injection 4 mg  4 mg Intravenous Q6H PRN Suella Grove      . senna (SENOKOT) tablet 8.6 mg  1 tablet Oral BID Andy Chon   8.6 mg at 10/21/11 0955  . sodium chloride 0.9 % bolus 500 mL  500 mL Intravenous Once Rhiana Morash      . zolpidem (AMBIEN) tablet 5-10 mg  5-10 mg Oral QHS PRN Suella Grove   10 mg at 10/20/11 2158  . DISCONTD: amLODipine (NORVASC) tablet 10 mg  10 mg Oral Daily Andy Chon   10 mg at 10/20/11 0932   No Known Allergies Principal Problem:  *Hematuria Active Problems:  Coagulopathy, supratheraputic INR  Acute on chronic renal failure   Vital signs in last 24 hours: Temp:  [98.1 F (36.7 C)-98.6 F (37 C)] 98.1 F (36.7 C) (01/06 1500) Pulse Rate:  [86-103] 103  (01/06 1500) Resp:  [18-20] 18  (01/06 1500) BP: (88-107)/(53-66) 92/62 mmHg (01/06 1500) SpO2:  [96 %-99 %] 96 % (01/06 1500) Weight change:  Last BM Date: 10/21/11  Intake/Output from previous day: 01/05 0701 - 01/06 0700 In: 2492.7 [P.O.:1080; I.V.:1412.7] Out: 450 [  Urine:450] Intake/Output this shift: Total I/O In: 880 [P.O.:480; I.V.:400] Out: 200 [Urine:200]  Lab Results:  [facility] 10/21/11 0417 10/20/11 0835  WBC 9.8 11.6*  HGB 9.5* 10.1*  HCT 29.3* 30.9*  PLT 328 323   BMET  Basename 10/21/11 0417 10/20/11 0835  NA 132* 133*  K 3.7 3.8  CL 94* 95*  CO2 26 30  GLUCOSE 108* 113*  BUN 41* 36*  CREATININE 2.41* 1.93*  CALCIUM 8.7 8.9    Studies/Results: Ct Abdomen Pelvis Wo Contrast  10/20/2011  *RADIOLOGY REPORT*  Clinical Data: Hematuria.  Back pain.  CT ABDOMEN AND PELVIS WITHOUT CONTRAST  Technique:  Multidetector CT imaging of the abdomen and pelvis was performed following the standard protocol without intravenous contrast.  Comparison: CT abdomen and pelvis with contrast 03/16/2011.  Findings: A right pleural effusion is present.  Scarring and atelectasis is present at the lung bases bilaterally.  There is some bronchiectasis.  A moderate sized hiatal hernia is again noted.  The heart is enlarged.  No significant pericardial  effusion is present.  Tiny cysts previously seen in the liver are not evident on this study.  The liver and spleen are otherwise within normal limits. The remainder of the stomach is within normal limits.  The pancreas and duodenum are unremarkable.  The common bile duct and gallbladder are normal.  The adrenal glands are normal bilaterally. Surgical clips are present about the left kidney without change. There is some thinning of the cortex.  Exophytic cysts of the right kidney are stable.  There is a focal calcification along the inferior aspect of the right cyst which may or may not be in the cyst.  No other significant nephrolithiasis is present.  The ureters are within normal limits bilaterally.  A Foley catheter is present within the urinary bladder.  The rectosigmoid colon is within normal limits.  Minimal presacral soft tissue prominence is stable.  The remainder of the colon is within normal limits.  Moderate stool is present.  The appendix is visualized and normal.  The small bowel is unremarkable.  No significant adenopathy or free fluid is present.  Atherosclerotic calcifications in the aorta are stable.  There is no aneurysm.  The ventral laparotomy scar is stable.  Bone windows demonstrate mild facet degenerative change.  No focal lytic or blastic lesions are evident.  There is some degenerative change in the hip joints bilaterally, worse on the left. Degenerative changes are evident in the SI joints as well.  A transitional L5 segment is present with partial sacralization on the right and pseudoarticulation with the sacral ala.  IMPRESSION:  1.  Stone versus cyst calcification in the right kidney is stable. 2.  No other nephrolithiasis or hydronephrosis. 3.  Stable right renal cysts. 4.  Stable postsurgical changes of the left kidney and renal atrophy. 5.  New right pleural effusion and associated airspace disease. 6.  Large hiatal hernia.  7.  Moderate stool throughout the colon. 8.  A Foley catheter  is present within the urinary bladder. 9.  Degenerative changes in the axial skeleton as described.  Original Report Authenticated By: Jamesetta Orleans. MATTERN, M.D.    Medications: I have reviewed the patient's current medications.   Physical exam GENERAL- alert HEAD- normal atraumatic, no neck masses, normal thyroid, no jvd RESPIRATORY- appears well, vitals normal, no respiratory distress, acyanotic, normal RR, ear and throat exam is normal, neck free of mass or lymphadenopathy, chest clear, no wheezing, crepitations, rhonchi, normal symmetric air entry  CVS- regular rate and rhythm, S1, S2 normal, no murmur, click, rub or gallop ABDOMEN- abdomen is soft without significant tenderness, masses, organomegaly or guarding NEURO- Grossly normal EXTREMITIES- extremities normal, atraumatic, no cyanosis or edema  Plan  *Hematuria-will give ffp. No mass on ct. * Coagulopathy, supratheraputic INR/recent pe- give ffp. ivc filter in am *Acute on chronic renal failure- stable.  *Uti/pna- on ceftriaxone/zithromax Follow urine culture.  * diastolic chf- compensated.  *diet controlled dm2    Tyneka Scafidi 10/21/2011 5:09 PM Pager: [phone].

## 2011-10-22 ENCOUNTER — Inpatient Hospital Stay (HOSPITAL_COMMUNITY): Payer: Medicare Other

## 2011-10-22 LAB — TYPE AND SCREEN: Unit division: 0

## 2011-10-22 LAB — BASIC METABOLIC PANEL
BUN: 42 mg/dL — ABNORMAL HIGH (ref 6–23)
CO2: 28 mEq/L (ref 19–32)
Chloride: 95 mEq/L — ABNORMAL LOW (ref 96–112)
Creatinine, Ser: 2.24 mg/dL — ABNORMAL HIGH (ref 0.50–1.10)
Glucose, Bld: 103 mg/dL — ABNORMAL HIGH (ref 70–99)
Potassium: 3.6 mEq/L (ref 3.5–5.1)

## 2011-10-22 LAB — URINE CULTURE
Colony Count: >=100000
Culture  Setup Time: 201301051133

## 2011-10-22 LAB — PREPARE FRESH FROZEN PLASMA
Unit division: 0
Unit division: 0

## 2011-10-22 LAB — PRO B NATRIURETIC PEPTIDE: Pro B Natriuretic peptide (BNP): 106 pg/mL (ref 0–450)

## 2011-10-22 LAB — CBC
HCT: 26.2 % — ABNORMAL LOW (ref 36.0–46.0)
Hemoglobin: 8.4 g/dL — ABNORMAL LOW (ref 12.0–15.0)
MCV: 90.3 fL (ref 78.0–100.0)
RDW: 14.8 % (ref 11.5–15.5)
WBC: 7.7 10*3/uL (ref 4.0–10.5)

## 2011-10-22 LAB — PROTIME-INR: INR: 2.05 — ABNORMAL HIGH (ref 0.00–1.49)

## 2011-10-22 MED ORDER — FUROSEMIDE 10 MG/ML IJ SOLN
20.0000 mg | Freq: Once | INTRAMUSCULAR | Status: AC
  Administered 2011-10-23: 20 mg via INTRAVENOUS
  Filled 2011-10-22: qty 2

## 2011-10-22 MED ORDER — VITAMIN K1 10 MG/ML IJ SOLN
10.0000 mg | Freq: Once | INTRAVENOUS | Status: AC
  Administered 2011-10-22: 10 mg via INTRAVENOUS
  Filled 2011-10-22: qty 1

## 2011-10-22 MED ORDER — FENTANYL CITRATE 0.05 MG/ML IJ SOLN
INTRAMUSCULAR | Status: AC | PRN
  Administered 2011-10-22 (×2): 50 ug via INTRAVENOUS

## 2011-10-22 MED ORDER — MIDAZOLAM HCL 5 MG/5ML IJ SOLN
INTRAMUSCULAR | Status: AC | PRN
  Administered 2011-10-22 (×2): 1 mg via INTRAVENOUS

## 2011-10-22 NOTE — Progress Notes (Signed)
SUBJECTIVE No complaints, still some hematuria this morning. Appreciate IR.   1. Urinary tract infection, acute   2. Elevated INR     Past Medical History  Diagnosis Date  . GERD (gastroesophageal reflux disease)   . Hypertension   . Diabetes mellitus     diet controlled  . Morbid obesity   . Arthritis   . Chronic kidney disease     nephrolithiasis left kidney,s/p open resection  . Status post arthroscopic surgery of right knee   . Diverticulosis of sigmoid colon   . Chronic back pain   . Pneumonia 07/2011    hx  . Pulmonary embolism 07/2011    hx  . Uterine cancer     endometrial high grade adenocarcinoma  . Uterus cancer, sarcoma     high grade invasive carcinosarcoma   Current Facility-Administered Medications  Medication Dose Route Frequency Provider Last Rate Last Dose  . acetaminophen (TYLENOL) tablet 650 mg  650 mg Oral Q6H PRN Suella Grove       Or  . acetaminophen (TYLENOL) suppository 650 mg  650 mg Rectal Q6H PRN Suella Grove      . azithromycin [facility]) tablet 250 mg  250 mg Oral Daily Jasalyn Frysinger   250 mg at 10/22/11 1404  . calcium carbonate (OS-CAL - dosed in mg of elemental calcium) tablet 500 mg of elemental calcium  1 tablet Oral Once Sammie Schermerhorn   500 mg of elemental calcium at 10/21/11 2008  . cefTRIAXone (ROCEPHIN) 1 g in dextrose 5 % 50 mL IVPB  1 g Intravenous Q24H Leann Trefz Poindexter, PHARMD   1 g at 10/22/11 0230  . ezetimibe (ZETIA) tablet 10 mg  10 mg Oral Daily Andy Chon   10 mg at 10/21/11 1804  . fentaNYL (SUBLIMAZE) injection   Intravenous PRN Art A Hoss   50 mcg at 10/22/11 1054  . furosemide (LASIX) injection 20 mg  20 mg Intravenous Once Simuel Stebner      . metoCLOPramide (REGLAN) tablet 10 mg  10 mg Oral Q6H PRN Suella Grove      . metoprolol succinate (TOPROL-XL) 24 hr tablet 25 mg  25 mg Oral Daily Andy Chon   25 mg at 10/22/11 1404  . midazolam (VERSED) 5 MG/5ML injection   Intravenous PRN Art A Hoss   1 mg at 10/22/11 1054  .  ondansetron (ZOFRAN) tablet 4 mg  4 mg Oral Q6H PRN Suella Grove       Or  . ondansetron [facility]) injection 4 mg  4 mg Intravenous Q6H PRN Andy Chon   4 mg at 10/22/11 9604  . phytonadione (VITAMIN K) 10 mg in dextrose 5 % 50 mL IVPB  10 mg Intravenous Once Ibraheem Voris   10 mg at 10/22/11 0919  . senna (SENOKOT) tablet 8.6 mg  1 tablet Oral BID Andy Chon   8.6 mg at 10/21/11 0955  . sodium chloride 0.9 % bolus 500 mL  500 mL Intravenous Once Natoria Archibald   500 mL at 10/21/11 1804  . zolpidem (AMBIEN) tablet 5-10 mg  5-10 mg Oral QHS PRN Andy Chon   10 mg at 10/20/11 2158  . DISCONTD: 0.45 % sodium chloride infusion   Intravenous Continuous Dimitra Woodstock 50 mL/hr at 10/21/11 0955 50 mL/hr at 10/21/11 0955  . DISCONTD: amLODipine (NORVASC) tablet 10 mg  10 mg Oral Daily Andy Chon   10 mg at 10/20/11 0932  . DISCONTD: calcium gluconate tablet 500 mg  1 tablet  Oral Once Tamyrah Burbage       No Known Allergies Principal Problem:  *Hematuria Active Problems:  Coagulopathy, supratheraputic INR  Acute on chronic renal failure   Vital signs in last 24 hours: Temp:  [97.1 F (36.2 C)-99.3 F (37.4 C)] 98.3 F (36.8 C) (01/07 1545) Pulse Rate:  [81-99] 81  (01/07 1545) Resp:  [13-18] 18  (01/07 1545) BP: (93-149)/(43-78) 105/64 mmHg (01/07 1545) SpO2:  [93 %-100 %] 98 % (01/07 1315) FiO2 (%):  [2 %] 2 % (01/07 1315) Weight change:  Last BM Date: 10/22/11  Intake/Output from previous day: 01/06 0701 - 01/07 0700 In: 2179 [P.O.:720; I.V.:800; Blood:659] Out: 650 [Urine:650] Intake/Output this shift: Total I/O In: 1624.5 [P.O.:240; Blood:684.5; Other:700] Out: 600 [Urine:600]  Lab Results:  [facility] 10/22/11 0335 10/21/11 0417  WBC 7.7 9.8  HGB 8.4* 9.5*  HCT 26.2* 29.3*  PLT 304 328   BMET  Basename 10/22/11 0335 10/21/11 0417  NA 133* 132*  K 3.6 3.7  CL 95* 94*  CO2 28 26  GLUCOSE 103* 108*  BUN 42* 41*  CREATININE 2.24* 2.41*  CALCIUM 8.9 8.7    Studies/Results: Ct  Abdomen Pelvis Wo Contrast  10/20/2011  *RADIOLOGY REPORT*  Clinical Data: Hematuria.  Back pain.  CT ABDOMEN AND PELVIS WITHOUT CONTRAST  Technique:  Multidetector CT imaging of the abdomen and pelvis was performed following the standard protocol without intravenous contrast.  Comparison: CT abdomen and pelvis with contrast 03/16/2011.  Findings: A right pleural effusion is present.  Scarring and atelectasis is present at the lung bases bilaterally.  There is some bronchiectasis.  A moderate sized hiatal hernia is again noted.  The heart is enlarged.  No significant pericardial effusion is present.  Tiny cysts previously seen in the liver are not evident on this study.  The liver and spleen are otherwise within normal limits. The remainder of the stomach is within normal limits.  The pancreas and duodenum are unremarkable.  The common bile duct and gallbladder are normal.  The adrenal glands are normal bilaterally. Surgical clips are present about the left kidney without change. There is some thinning of the cortex.  Exophytic cysts of the right kidney are stable.  There is a focal calcification along the inferior aspect of the right cyst which may or may not be in the cyst.  No other significant nephrolithiasis is present.  The ureters are within normal limits bilaterally.  A Foley catheter is present within the urinary bladder.  The rectosigmoid colon is within normal limits.  Minimal presacral soft tissue prominence is stable.  The remainder of the colon is within normal limits.  Moderate stool is present.  The appendix is visualized and normal.  The small bowel is unremarkable.  No significant adenopathy or free fluid is present.  Atherosclerotic calcifications in the aorta are stable.  There is no aneurysm.  The ventral laparotomy scar is stable.  Bone windows demonstrate mild facet degenerative change.  No focal lytic or blastic lesions are evident.  There is some degenerative change in the hip joints  bilaterally, worse on the left. Degenerative changes are evident in the SI joints as well.  A transitional L5 segment is present with partial sacralization on the right and pseudoarticulation with the sacral ala.  IMPRESSION:  1.  Stone versus cyst calcification in the right kidney is stable. 2.  No other nephrolithiasis or hydronephrosis. 3.  Stable right renal cysts. 4.  Stable postsurgical changes of the left kidney  and renal atrophy. 5.  New right pleural effusion and associated airspace disease. 6.  Large hiatal hernia.  7.  Moderate stool throughout the colon. 8.  A Foley catheter is present within the urinary bladder. 9.  Degenerative changes in the axial skeleton as described.  Original Report Authenticated By: Jamesetta Orleans. MATTERN, M.D.   Ir Ivc Filter Plmt / S&i /img Guid/mod Sed  10/22/2011  *RADIOLOGY REPORT*  Clinical Data/Indication: PULMONARY THROMBOEMBOLISM ON COUMADIN.  IVC FILTER PLACEMENT, CO2 VENOGRAM  Sedation: Versed 2 mg, Fentanyl 100 mg.  Total Moderate Sedation Time: 20 minutes.  Fluoroscopy Time: 2.3 minutes.  Procedure: The procedure, risks, benefits, and alternatives were explained to the patient. Questions regarding the procedure were encouraged and answered. The patient understands and consents to the procedure.  The right neck was prepped with chlorhexidine in a sterile fashion, and a sterile drape was applied covering the operative field. A sterile gown and sterile gloves were used for the procedure.  Under sonographic guidance, a micropuncture needle was inserted into the right internal jugular vein and removed over a 0018 wire which was upsized to a [employer].  Sonographic and fluoroscopic documentation was obtained.  A pigtail catheter was advanced into the IVC and carbon dioxide venography was performed.  The pigtail was exchanged for the long venous sheath.  The Celect IVC filter was then deployed with its tip at the L1-2 disc.  The sheath was removed and hemostasis was achieved  with direct pressure.  Findings: CO2 venography demonstrates renal vein inflow without venous anomaly.  The final image demonstrates an IVC filter with its tip at the L1-2 disc.  Complications: None.  IMPRESSION: Successful IVC filter placement.  Original Report Authenticated By: Donavan Burnet, M.D.    Medications: I have reviewed the patient's current medications.   Physical exam GENERAL- alert HEAD- normal atraumatic, no neck masses, normal thyroid, no jvd RESPIRATORY- appears well, vitals normal, no respiratory distress, acyanotic, normal RR, ear and throat exam is normal, neck free of mass or lymphadenopathy, chest clear, no wheezing, crepitations, rhonchi, normal symmetric air entry CVS- regular rate and rhythm, S1, S2 normal, no murmur, click, rub or gallop ABDOMEN- abdomen is soft without significant tenderness, masses, organomegaly or guarding NEURO- Grossly normal EXTREMITIES- extremities normal, atraumatic, no cyanosis or edemafoley in place, dark colored urine, some evidence of clearing.  Plan  *Hematuria-still hematuria. Give vit k, ffp. Follow up gu once inr normal. * Coagulopathy, supratheraputic INR/recent pe- give ffp. S/p ivc filter today. *Acute on chronic renal failure- no significant change.  *Uti/pna- on ceftriaxone/zithromax Follow urine culture.  * diastolic chf- compensated.  *diet controlled dm2 *Acute blood loss anemia from gross hematuria related to coagulopathy- transfuse prbc.  Hopefully d/c in am.      Eagan Shifflett 10/22/2011 4:42 PM Pager: [phone].

## 2011-10-22 NOTE — ED Notes (Signed)
Patient denies pain and is resting comfortably.  

## 2011-10-22 NOTE — Progress Notes (Signed)
UR completed 

## 2011-10-22 NOTE — Interval H&P Note (Cosign Needed)
History and Physical Interval Note:  10/22/2011 8:55 AM  Patient is tentatively scheduled for IVC filter placement today utilizing CO2.  The various methods of treatment have been discussed with the patient and family. After consideration of risks, benefits and other options for treatment, the patient has consented to the above procedure.  The patients' history has been reviewed, patient examined, no change in status, stable for the above procedure.  I have reviewed the patients' chart and labs.  Questions were answered to the patient's satisfaction.  Consent signed. Past Medical History  Diagnosis Date  . GERD (gastroesophageal reflux disease)   . Hypertension   . Diabetes mellitus     diet controlled  . Morbid obesity   . Arthritis   . Chronic kidney disease     nephrolithiasis left kidney,s/p open resection  . Status post arthroscopic surgery of right knee   . Diverticulosis of sigmoid colon   . Chronic back pain   . Pneumonia 07/2011    hx  . Pulmonary embolism 07/2011    hx  . Uterine cancer     endometrial high grade adenocarcinoma  . Uterus cancer, sarcoma     high grade invasive carcinosarcoma   Results for orders placed during the hospital encounter of 10/19/11  URINALYSIS, ROUTINE W REFLEX MICROSCOPIC      Component Value Range   Color, Urine AMBER (*) YELLOW    APPearance TURBID (*) CLEAR    Specific Gravity, Urine 1.017  1.005 - 1.030    pH 5.5  5.0 - 8.0    Glucose, UA NEGATIVE  NEGATIVE (mg/dL)   Hgb urine dipstick LARGE (*) NEGATIVE    Bilirubin Urine SMALL (*) NEGATIVE    Ketones, ur TRACE (*) NEGATIVE (mg/dL)   Protein, ur 30 (*) NEGATIVE (mg/dL)   Urobilinogen, UA 1.0  0.0 - 1.0 (mg/dL)   Nitrite NEGATIVE  NEGATIVE    Leukocytes, UA LARGE (*) NEGATIVE   CBC      Component Value Range   WBC 10.6 (*) 4.0 - 10.5 (K/uL)   RBC 3.41 (*) 3.87 - 5.11 (MIL/uL)   Hemoglobin 10.0 (*) 12.0 - 15.0 (g/dL)   HCT 16.1 (*) 09.6 - 46.0 (%)   MCV 90.6  78.0 - 100.0 (fL)     MCH 29.3  26.0 - 34.0 (pg)   MCHC 32.4  30.0 - 36.0 (g/dL)   RDW 04.5  40.9 - 81.1 (%)   Platelets 342  150 - 400 (K/uL)  DIFFERENTIAL      Component Value Range   Neutrophils Relative 78 (*) 43 - 77 (%)   Neutro Abs 8.3 (*) 1.7 - 7.7 (K/uL)   Lymphocytes Relative 11 (*) 12 - 46 (%)   Lymphs Abs 1.1  0.7 - 4.0 (K/uL)   Monocytes Relative 8  3 - 12 (%)   Monocytes Absolute 0.9  0.1 - 1.0 (K/uL)   Eosinophils Relative 3  0 - 5 (%)   Eosinophils Absolute 0.3  0.0 - 0.7 (K/uL)   Basophils Relative 0  0 - 1 (%)   Basophils Absolute 0.0  0.0 - 0.1 (K/uL)  BASIC METABOLIC PANEL      Component Value Range   Sodium 133 (*) 135 - 145 (mEq/L)   Potassium 3.6  3.5 - 5.1 (mEq/L)   Chloride 94 (*) 96 - 112 (mEq/L)   CO2 28  19 - 32 (mEq/L)   Glucose, Bld 121 (*) 70 - 99 (mg/dL)   BUN 38 (*) 6 -  23 (mg/dL)   Creatinine, Ser 4.54 (*) 0.50 - 1.10 (mg/dL)   Calcium 9.0  8.4 - 09.8 (mg/dL)   GFR calc non Af Amer 22 (*) >90 (mL/min)   GFR calc Af Amer 26 (*) >90 (mL/min)  PROTIME-INR      Component Value Range   Prothrombin Time 54.1 (*) 11.6 - 15.2 (seconds)   INR 5.97 (*) 0.00 - 1.49   URINE MICROSCOPIC-ADD ON      Component Value Range   Squamous Epithelial / LPF FEW (*) RARE    WBC, UA TOO NUMEROUS TO COUNT  <3 (WBC/hpf)   RBC / HPF TOO NUMEROUS TO COUNT  <3 (RBC/hpf)   Bacteria, UA MANY (*) RARE   URINE CULTURE      Component Value Range   Specimen Description URINE, CATHETERIZED     Special Requests NONE     Setup Time [id number]     Colony Count >=100,000 COLONIES/ML     Culture ESCHERICHIA COLI     Report Status 10/22/2011 FINAL     Organism ID, Bacteria ESCHERICHIA COLI    PROTIME-INR      Component Value Range   Prothrombin Time 57.3 (*) 11.6 - 15.2 (seconds)   INR 6.42 (*) 0.00 - 1.49   CBC      Component Value Range   WBC 11.6 (*) 4.0 - 10.5 (K/uL)   RBC 3.38 (*) 3.87 - 5.11 (MIL/uL)   Hemoglobin 10.1 (*) 12.0 - 15.0 (g/dL)   HCT 13.0 (*) 86.5 - 46.0 (%)   MCV  91.4  78.0 - 100.0 (fL)   MCH 29.9  26.0 - 34.0 (pg)   MCHC 32.7  30.0 - 36.0 (g/dL)   RDW 78.4  69.6 - 29.5 (%)   Platelets 323  150 - 400 (K/uL)  BASIC METABOLIC PANEL      Component Value Range   Sodium 133 (*) 135 - 145 (mEq/L)   Potassium 3.8  3.5 - 5.1 (mEq/L)   Chloride 95 (*) 96 - 112 (mEq/L)   CO2 30  19 - 32 (mEq/L)   Glucose, Bld 113 (*) 70 - 99 (mg/dL)   BUN 36 (*) 6 - 23 (mg/dL)   Creatinine, Ser 2.84 (*) 0.50 - 1.10 (mg/dL)   Calcium 8.9  8.4 - 13.2 (mg/dL)   GFR calc non Af Amer 24 (*) >90 (mL/min)   GFR calc Af Amer 28 (*) >90 (mL/min)  PRO B NATRIURETIC PEPTIDE      Component Value Range   Pro B Natriuretic peptide (BNP) 192.6  0 - 450 (pg/mL)  MAGNESIUM      Component Value Range   Magnesium 2.1  1.5 - 2.5 (mg/dL)  PRO B NATRIURETIC PEPTIDE      Component Value Range   Pro B Natriuretic peptide (BNP) 161.6  0 - 450 (pg/mL)  MAGNESIUM      Component Value Range   Magnesium 2.0  1.5 - 2.5 (mg/dL)  PROTIME-INR      Component Value Range   Prothrombin Time 49.2 (*) 11.6 - 15.2 (seconds)   INR 5.28 (*) 0.00 - 1.49   CBC      Component Value Range   WBC 9.8  4.0 - 10.5 (K/uL)   RBC 3.23 (*) 3.87 - 5.11 (MIL/uL)   Hemoglobin 9.5 (*) 12.0 - 15.0 (g/dL)   HCT 44.0 (*) 10.2 - 46.0 (%)   MCV 90.7  78.0 - 100.0 (fL)   MCH 29.4  26.0 - 34.0 (pg)  MCHC 32.4  30.0 - 36.0 (g/dL)   RDW 96.0  45.4 - 09.8 (%)   Platelets 328  150 - 400 (K/uL)  BASIC METABOLIC PANEL      Component Value Range   Sodium 132 (*) 135 - 145 (mEq/L)   Potassium 3.7  3.5 - 5.1 (mEq/L)   Chloride 94 (*) 96 - 112 (mEq/L)   CO2 26  19 - 32 (mEq/L)   Glucose, Bld 108 (*) 70 - 99 (mg/dL)   BUN 41 (*) 6 - 23 (mg/dL)   Creatinine, Ser 1.19 (*) 0.50 - 1.10 (mg/dL)   Calcium 8.7  8.4 - 14.7 (mg/dL)   GFR calc non Af Amer 18 (*) >90 (mL/min)   GFR calc Af Amer 21 (*) >90 (mL/min)  PRO B NATRIURETIC PEPTIDE      Component Value Range   Pro B Natriuretic peptide (BNP) 106.0  0 - 450 (pg/mL)    MAGNESIUM      Component Value Range   Magnesium 2.1  1.5 - 2.5 (mg/dL)  PROTIME-INR      Component Value Range   Prothrombin Time 23.5 (*) 11.6 - 15.2 (seconds)   INR 2.05 (*) 0.00 - 1.49   CBC      Component Value Range   WBC 7.7  4.0 - 10.5 (K/uL)   RBC 2.90 (*) 3.87 - 5.11 (MIL/uL)   Hemoglobin 8.4 (*) 12.0 - 15.0 (g/dL)   HCT 82.9 (*) 56.2 - 46.0 (%)   MCV 90.3  78.0 - 100.0 (fL)   MCH 29.0  26.0 - 34.0 (pg)   MCHC 32.1  30.0 - 36.0 (g/dL)   RDW 13.0  86.5 - 78.4 (%)   Platelets 304  150 - 400 (K/uL)  BASIC METABOLIC PANEL      Component Value Range   Sodium 133 (*) 135 - 145 (mEq/L)   Potassium 3.6  3.5 - 5.1 (mEq/L)   Chloride 95 (*) 96 - 112 (mEq/L)   CO2 28  19 - 32 (mEq/L)   Glucose, Bld 103 (*) 70 - 99 (mg/dL)   BUN 42 (*) 6 - 23 (mg/dL)   Creatinine, Ser 6.96 (*) 0.50 - 1.10 (mg/dL)   Calcium 8.9  8.4 - 29.5 (mg/dL)   GFR calc non Af Amer 20 (*) >90 (mL/min)   GFR calc Af Amer 23 (*) >90 (mL/min)  PREPARE FRESH FROZEN PLASMA      Component Value Range   Unit Number [id number]     Blood Component Type THAWED PLASMA     Unit division 00     Status of Unit ISSUED,FINAL     Transfusion Status OK TO TRANSFUSE     Unit Number [id number]     Blood Component Type THAWED PLASMA     Unit division 00     Status of Unit ISSUED,FINAL     Transfusion Status OK TO TRANSFUSE       Tamatha Gadbois,D [facility]

## 2011-10-22 NOTE — Procedures (Signed)
CO2 IVC filter placement L1/2

## 2011-10-22 NOTE — H&P (View-Only) (Signed)
Some gross hematuria today, bp 80s-90s systolic today.  SUBJECTIVE Denies dizziness.   1. Urinary tract infection, acute   2. Elevated INR     Past Medical History  Diagnosis Date  . GERD (gastroesophageal reflux disease)   . Hypertension   . Diabetes mellitus     diet controlled  . Morbid obesity   . Arthritis   . Chronic kidney disease     nephrolithiasis left kidney,s/p open resection  . Status post arthroscopic surgery of right knee   . Diverticulosis of sigmoid colon   . Chronic back pain   . Pneumonia 07/2011    hx  . Pulmonary embolism 07/2011    hx  . Uterine cancer     endometrial high grade adenocarcinoma  . Uterus cancer, sarcoma     high grade invasive carcinosarcoma   Current Facility-Administered Medications  Medication Dose Route Frequency Provider Last Rate Last Dose  . 0.45 % sodium chloride infusion   Intravenous Continuous Guthrie Lemme 50 mL/hr at 10/21/11 0955 50 mL/hr at 10/21/11 0955  . acetaminophen (TYLENOL) tablet 650 mg  650 mg Oral Q6H PRN Andy Chon       Or  . acetaminophen (TYLENOL) suppository 650 mg  650 mg Rectal Q6H PRN Andy Chon      . azithromycin (ZITHROMAX) tablet 500 mg  500 mg Oral Daily Xavier Fournier   500 mg at 10/21/11 1525   Followed by  . azithromycin (ZITHROMAX) tablet 250 mg  250 mg Oral Daily Jeanise Durfey      . calcium gluconate tablet 500 mg  1 tablet Oral Once Keimya Briddell      . cefTRIAXone (ROCEPHIN) 1 g in dextrose 5 % 50 mL IVPB  1 g Intravenous Q24H Leann Trefz Poindexter, PHARMD   1 g at 10/21/11 0253  . ezetimibe (ZETIA) tablet 10 mg  10 mg Oral Daily Andy Chon   10 mg at 10/20/11 1815  . metoCLOPramide (REGLAN) tablet 10 mg  10 mg Oral Q6H PRN Andy Chon      . metoprolol succinate (TOPROL-XL) 24 hr tablet 25 mg  25 mg Oral Daily Andy Chon   25 mg at 10/20/11 0932  . ondansetron (ZOFRAN) tablet 4 mg  4 mg Oral Q6H PRN Andy Chon       Or  . ondansetron (ZOFRAN) injection 4 mg  4 mg Intravenous Q6H PRN Andy  Chon      . senna (SENOKOT) tablet 8.6 mg  1 tablet Oral BID Andy Chon   8.6 mg at 10/21/11 0955  . sodium chloride 0.9 % bolus 500 mL  500 mL Intravenous Once May Manrique      . zolpidem (AMBIEN) tablet 5-10 mg  5-10 mg Oral QHS PRN Andy Chon   10 mg at 10/20/11 2158  . DISCONTD: amLODipine (NORVASC) tablet 10 mg  10 mg Oral Daily Andy Chon   10 mg at 10/20/11 0932   No Known Allergies Principal Problem:  *Hematuria Active Problems:  Coagulopathy, supratheraputic INR  Acute on chronic renal failure   Vital signs in last 24 hours: Temp:  [98.1 F (36.7 C)-98.6 F (37 C)] 98.1 F (36.7 C) (01/06 1500) Pulse Rate:  [86-103] 103  (01/06 1500) Resp:  [18-20] 18  (01/06 1500) BP: (88-107)/(53-66) 92/62 mmHg (01/06 1500) SpO2:  [96 %-99 %] 96 % (01/06 1500) Weight change:  Last BM Date: 10/21/11  Intake/Output from previous day: 01/05 0701 - 01/06 0700 In: 2492.7 [P.O.:1080; I.V.:1412.7] Out: 450 [  Urine:450] Intake/Output this shift: Total I/O In: 880 [P.O.:480; I.V.:400] Out: 200 [Urine:200]  Lab Results:  Basename 10/21/11 0417 10/20/11 0835  WBC 9.8 11.6*  HGB 9.5* 10.1*  HCT 29.3* 30.9*  PLT 328 323   BMET  Basename 10/21/11 0417 10/20/11 0835  NA 132* 133*  K 3.7 3.8  CL 94* 95*  CO2 26 30  GLUCOSE 108* 113*  BUN 41* 36*  CREATININE 2.41* 1.93*  CALCIUM 8.7 8.9    Studies/Results: Ct Abdomen Pelvis Wo Contrast  10/20/2011  *RADIOLOGY REPORT*  Clinical Data: Hematuria.  Back pain.  CT ABDOMEN AND PELVIS WITHOUT CONTRAST  Technique:  Multidetector CT imaging of the abdomen and pelvis was performed following the standard protocol without intravenous contrast.  Comparison: CT abdomen and pelvis with contrast 03/16/2011.  Findings: A right pleural effusion is present.  Scarring and atelectasis is present at the lung bases bilaterally.  There is some bronchiectasis.  A moderate sized hiatal hernia is again noted.  The heart is enlarged.  No significant pericardial  effusion is present.  Tiny cysts previously seen in the liver are not evident on this study.  The liver and spleen are otherwise within normal limits. The remainder of the stomach is within normal limits.  The pancreas and duodenum are unremarkable.  The common bile duct and gallbladder are normal.  The adrenal glands are normal bilaterally. Surgical clips are present about the left kidney without change. There is some thinning of the cortex.  Exophytic cysts of the right kidney are stable.  There is a focal calcification along the inferior aspect of the right cyst which may or may not be in the cyst.  No other significant nephrolithiasis is present.  The ureters are within normal limits bilaterally.  A Foley catheter is present within the urinary bladder.  The rectosigmoid colon is within normal limits.  Minimal presacral soft tissue prominence is stable.  The remainder of the colon is within normal limits.  Moderate stool is present.  The appendix is visualized and normal.  The small bowel is unremarkable.  No significant adenopathy or free fluid is present.  Atherosclerotic calcifications in the aorta are stable.  There is no aneurysm.  The ventral laparotomy scar is stable.  Bone windows demonstrate mild facet degenerative change.  No focal lytic or blastic lesions are evident.  There is some degenerative change in the hip joints bilaterally, worse on the left. Degenerative changes are evident in the SI joints as well.  A transitional L5 segment is present with partial sacralization on the right and pseudoarticulation with the sacral ala.  IMPRESSION:  1.  Stone versus cyst calcification in the right kidney is stable. 2.  No other nephrolithiasis or hydronephrosis. 3.  Stable right renal cysts. 4.  Stable postsurgical changes of the left kidney and renal atrophy. 5.  New right pleural effusion and associated airspace disease. 6.  Large hiatal hernia.  7.  Moderate stool throughout the colon. 8.  A Foley catheter  is present within the urinary bladder. 9.  Degenerative changes in the axial skeleton as described.  Original Report Authenticated By: CHRISTOPHER W. MATTERN, M.D.    Medications: I have reviewed the patient's current medications.   Physical exam GENERAL- alert HEAD- normal atraumatic, no neck masses, normal thyroid, no jvd RESPIRATORY- appears well, vitals normal, no respiratory distress, acyanotic, normal RR, ear and throat exam is normal, neck free of mass or lymphadenopathy, chest clear, no wheezing, crepitations, rhonchi, normal symmetric air entry   CVS- regular rate and rhythm, S1, S2 normal, no murmur, click, rub or gallop ABDOMEN- abdomen is soft without significant tenderness, masses, organomegaly or guarding NEURO- Grossly normal EXTREMITIES- extremities normal, atraumatic, no cyanosis or edema  Plan  *Hematuria-will give ffp. No mass on ct. * Coagulopathy, supratheraputic INR/recent pe- give ffp. ivc filter in am *Acute on chronic renal failure- stable.  *Uti/pna- on ceftriaxone/zithromax Follow urine culture.  * diastolic chf- compensated.  *diet controlled dm2    Toribio Seiber 10/21/2011 5:09 PM Pager: [phone].    

## 2011-10-23 LAB — PREPARE FRESH FROZEN PLASMA: Unit division: 0

## 2011-10-23 LAB — CBC
MCV: 90.4 fL (ref 78.0–100.0)
Platelets: 297 10*3/uL (ref 150–400)
RDW: 14.8 % (ref 11.5–15.5)
WBC: 8 10*3/uL (ref 4.0–10.5)

## 2011-10-23 LAB — COMPREHENSIVE METABOLIC PANEL
ALT: 10 U/L (ref 0–35)
AST: 15 U/L (ref 0–37)
Albumin: 2.8 g/dL — ABNORMAL LOW (ref 3.5–5.2)
Chloride: 96 mEq/L (ref 96–112)
Creatinine, Ser: 1.82 mg/dL — ABNORMAL HIGH (ref 0.50–1.10)
Potassium: 3.8 mEq/L (ref 3.5–5.1)
Sodium: 134 mEq/L — ABNORMAL LOW (ref 135–145)
Total Bilirubin: 0.7 mg/dL (ref 0.3–1.2)

## 2011-10-23 LAB — PRO B NATRIURETIC PEPTIDE: Pro B Natriuretic peptide (BNP): 434.3 pg/mL (ref 0–450)

## 2011-10-23 LAB — PROTIME-INR
INR: 1.13 (ref 0.00–1.49)
Prothrombin Time: 14.7 seconds (ref 11.6–15.2)

## 2011-10-23 LAB — MAGNESIUM: Magnesium: 2.1 mg/dL (ref 1.5–2.5)

## 2011-10-23 MED ORDER — ASPIRIN 81 MG PO CHEW
81.0000 mg | CHEWABLE_TABLET | Freq: Every day | ORAL | Status: DC
  Administered 2011-10-24: 81 mg via ORAL
  Filled 2011-10-23: qty 1

## 2011-10-23 MED ORDER — TRAMADOL HCL 50 MG PO TABS
100.0000 mg | ORAL_TABLET | Freq: Four times a day (QID) | ORAL | Status: DC | PRN
  Administered 2011-10-24: 100 mg via ORAL
  Filled 2011-10-23: qty 2

## 2011-10-23 NOTE — Progress Notes (Addendum)
Christine [name] is an extremely pleasant lady admitted on 10/20/11 with gross hematuria in setting of supra therapeutic inr on coumadin for pe. Apparently, she did not have an INR check since her last hospitalization late December. At admission the INR was 5.97. She required ffp/vit k/prbc as she had acute blood loss anemia with some hypotension. An ivc filter was also placed. She has had an indwelling foley catheter since admission. A ct abdomen/pelvis on 10/20/11 did not show a bladder mass. Likely, coagulopathy caused the hematuria. The urine has cleared up and foley will be removed today. If she continues to have hematuria, she will need urology referral. She responded to ffp/prbc with inr/hb 1.13/10.6 respectively. Will remove foley and see how she does today. Patient being also treated for e.coli uti and presumptive PNA.  SUBJECTIVE No complaints.   1. Urinary tract infection, acute   2. Elevated INR     Past Medical History  Diagnosis Date  . GERD (gastroesophageal reflux disease)   . Hypertension   . Diabetes mellitus     diet controlled  . Morbid obesity   . Arthritis   . Chronic kidney disease     nephrolithiasis left kidney,s/p open resection  . Status post arthroscopic surgery of right knee   . Diverticulosis of sigmoid colon   . Chronic back pain   . Pneumonia 07/2011    hx  . Pulmonary embolism 07/2011    hx  . Uterine cancer     endometrial high grade adenocarcinoma  . Uterus cancer, sarcoma     high grade invasive carcinosarcoma   Current Facility-Administered Medications  Medication Dose Route Frequency Provider Last Rate Last Dose  . acetaminophen (TYLENOL) tablet 650 mg  650 mg Oral Q6H PRN Christine [name]   650 mg at 10/23/11 0325   Or  . acetaminophen (TYLENOL) suppository 650 mg  650 mg Rectal Q6H PRN Christine [name]      . aspirin chewable tablet 81 mg  81 mg Oral Daily Christine [name]      . azithromycin (ZITHROMAX) tablet 250 mg  250 mg Oral Daily Christine [name]   250 mg at  10/23/11 1046  . cefTRIAXone (ROCEPHIN) 1 g in dextrose 5 % 50 mL IVPB  1 g Intravenous Q24H Christine [name], PHARMD   1 g at 10/23/11 0248  . ezetimibe (ZETIA) tablet 10 mg  10 mg Oral Daily Christine [name]   10 mg at 10/22/11 1904  . furosemide (LASIX) injection 20 mg  20 mg Intravenous Once Christine [name]   20 mg at 10/23/11 0245  . metoCLOPramide (REGLAN) tablet 10 mg  10 mg Oral Q6H PRN Christine [name]      . metoprolol succinate (TOPROL-XL) 24 hr tablet 25 mg  25 mg Oral Daily Christine [name]   25 mg at 10/23/11 1046  . ondansetron (ZOFRAN) tablet 4 mg  4 mg Oral Q6H PRN Christine [name]       Or  . ondansetron [facility]) injection 4 mg  4 mg Intravenous Q6H PRN Christine [name]   4 mg at 10/22/11 2015  . senna (SENOKOT) tablet 8.6 mg  1 tablet Oral BID Christine [name]   8.6 mg at 10/23/11 1046  . traMADol (ULTRAM) tablet 100 mg  100 mg Oral Q6H PRN Christine [name]      . zolpidem (AMBIEN) tablet 5-10 mg  5-10 mg Oral QHS PRN Christine [name]   5 mg at 10/22/11 2201   No Known Allergies Principal Problem:  *Hematuria  Active Problems:  Coagulopathy, supratheraputic INR  Acute on chronic renal failure   Vital signs in last 24 hours: Temp:  [98 F (36.7 C)-99.2 F (37.3 C)] 98.6 F (37 C) (01/08 1319) Pulse Rate:  [77-92] 81  (01/08 1319) Resp:  [16-20] 18  (01/08 1319) BP: (98-119)/(44-70) 116/66 mmHg (01/08 1319) SpO2:  [96 %-100 %] 100 % (01/08 1319) Weight change:  Last BM Date: 10/22/11  Intake/Output from previous day: 01/07 0701 - 01/08 0700 In: 2229.5 [P.O.:820; Blood:709.5] Out: 1175 [Urine:1175] Intake/Output this shift: Total I/O In: 600 [P.O.:600] Out: 2301 [Urine:2300; Stool:1]  Lab Results:  [facility] 10/23/11 0935 10/22/11 0335  WBC 8.0 7.7  HGB 10.6* 8.4*  HCT 32.8* 26.2*  PLT 297 304   BMET  Basename 10/23/11 0935 10/22/11 0335  NA 134* 133*  K 3.8 3.6  CL 96 95*  CO2 28 28  GLUCOSE 89 103*  BUN 36* 42*  CREATININE 1.82* 2.24*  CALCIUM 9.2 8.9    Studies/Results: Ir Ivc Filter  Plmt / S&i /img Guid/mod Sed  10/22/2011  *RADIOLOGY REPORT*  Clinical Data/Indication: PULMONARY THROMBOEMBOLISM ON COUMADIN.  IVC FILTER PLACEMENT, CO2 VENOGRAM  Sedation: Versed 2 mg, Fentanyl 100 mg.  Total Moderate Sedation Time: 20 minutes.  Fluoroscopy Time: 2.3 minutes.  Procedure: The procedure, risks, benefits, and alternatives were explained to the patient. Questions regarding the procedure were encouraged and answered. The patient understands and consents to the procedure.  The right neck was prepped with chlorhexidine in a sterile fashion, and a sterile drape was applied covering the operative field. A sterile gown and sterile gloves were used for the procedure.  Under sonographic guidance, a micropuncture needle was inserted into the right internal jugular vein and removed over a 0018 wire which was upsized to a [employer].  Sonographic and fluoroscopic documentation was obtained.  A pigtail catheter was advanced into the IVC and carbon dioxide venography was performed.  The pigtail was exchanged for the long venous sheath.  The Celect IVC filter was then deployed with its tip at the L1-2 disc.  The sheath was removed and hemostasis was achieved with direct pressure.  Findings: CO2 venography demonstrates renal vein inflow without venous anomaly.  The final image demonstrates an IVC filter with its tip at the L1-2 disc.  Complications: None.  IMPRESSION: Successful IVC filter placement.  Original Report Authenticated By: Donavan Burnet, M.D.    Medications: I have reviewed the patient's current medications.   Physical exam GENERAL- alert HEAD- normal atraumatic, no neck masses, normal thyroid, no jvd RESPIRATORY- appears well, vitals normal, no respiratory distress, acyanotic, normal RR, ear and throat exam is normal, neck free of mass or lymphadenopathy, chest clear, no wheezing, crepitations, rhonchi, normal symmetric air entry CVS- regular rate and rhythm, S1, S2 normal, no murmur, click, rub  or gallop ABDOMEN- abdomen is soft without significant tenderness, masses, organomegaly or guarding NEURO- Grossly normal EXTREMITIES- extremities normal, atraumatic, no cyanosis or edema  Plan  *Hematuria-seems resolved. D/c foley. Follow gu outpatient as necessary. * Coagulopathy, supratheraputic INR/recent pe- given ffp. S/p ivc filter today.  *Acute on chronic renal failure- no significant change.  *E. Coli Uti/pna- on ceftriaxone/zithromax .  * diastolic chf- compensated.  *diet controlled dm2  *Acute blood loss anemia from gross hematuria related to coagulopathy- responded to prbc.  Hopefully d/c in am.  Morbid obesity bmi 56.    Sharronda Schweers 10/23/2011 5:25 PM Pager: [phone].

## 2011-10-23 NOTE — Clinical Documentation Improvement (Signed)
BMI DOCUMENTATION CLARIFICATION QUERY  THIS DOCUMENT IS NOT A PERMANENT PART OF THE MEDICAL RECORD  TO RESPOND TO THE THIS QUERY, FOLLOW THE INSTRUCTIONS BELOW:  1. If needed, update documentation for the patient's encounter via the notes activity.  2. Access this query again and click edit on the In [employer].  3. After updating, or not, click F2 to complete all highlighted (required) fields concerning your review. Select "additional documentation in the medical record" OR "no additional documentation provided".  4. Click Sign note button.  5. The deficiency will fall out of your In Basket *Please let us know if you are not able to complete this workflow by phone or e-mail (listed below).         10/23/11  Dear Dr. Venetia Constable Marton Redwood  In an effort to better capture your patient's severity of illness, reflect appropriate length of stay and utilization of resources, a review of the patient medical record has revealed the following indicators.    Based on your clinical judgment, please clarify and document in a progress note and/or discharge summary the clinical condition associated with the following supporting information:  In responding to this query please exercise your independent judgment.  The fact that a query is asked, does not imply that any particular answer is desired or expected.  Great Job of documenting the diagnosis of 'Morbid Obesity.' To make this documentation even more awesome and complete please include the BMI along with the diagnosis.  BMI=56  BEST PRACTICE: When linking BMI to a diagnosis please document both BMI and diagnosis together in pn for accuracy of SOI and ROM.    Possible Clinical conditions  Morbid Obesity W/ BMI=   Underweight w/BMI=  Other condition___________________  Cannot Clinically determine _____________  Risk Factors: Morbid Obesity, HTN, Hematuria  Sign &  Symptoms:  BMI-56 5'7"/356  Diagnostics: Lab:  Treatment monitoring    Reviewed: yes  Thank You,  Enis Slipper  RN, BSN, CCDS Clinical Documentation Specialist [facility] Dept Pager: [phone] / E-mail: [email or web address]  Health Information Management [place]

## 2011-10-24 DIAGNOSIS — N390 Urinary tract infection, site not specified: Secondary | ICD-10-CM | POA: Diagnosis present

## 2011-10-24 LAB — CBC
MCH: 29.3 pg (ref 26.0–34.0)
MCHC: 32.3 g/dL (ref 30.0–36.0)
MCV: 90.7 fL (ref 78.0–100.0)
Platelets: 308 10*3/uL (ref 150–400)
RDW: 14.8 % (ref 11.5–15.5)

## 2011-10-24 LAB — BASIC METABOLIC PANEL
CO2: 27 mEq/L (ref 19–32)
Calcium: 9.2 mg/dL (ref 8.4–10.5)
Creatinine, Ser: 1.59 mg/dL — ABNORMAL HIGH (ref 0.50–1.10)
GFR calc Af Amer: 35 mL/min — ABNORMAL LOW (ref >90)
GFR calc non Af Amer: 30 mL/min — ABNORMAL LOW (ref >90)
Sodium: 136 mEq/L (ref 135–145)

## 2011-10-24 LAB — PROTIME-INR: Prothrombin Time: 14.3 seconds (ref 11.6–15.2)

## 2011-10-24 LAB — MAGNESIUM: Magnesium: 2 mg/dL (ref 1.5–2.5)

## 2011-10-24 MED ORDER — SODIUM CHLORIDE 0.9 % IJ SOLN
3.0000 mL | Freq: Two times a day (BID) | INTRAMUSCULAR | Status: DC
  Administered 2011-10-24: 3 mL via INTRAVENOUS

## 2011-10-24 MED ORDER — CEFUROXIME AXETIL 500 MG PO TABS: 500.0000 mg | ORAL_TABLET | Freq: Two times a day (BID) | ORAL | Status: DC

## 2011-10-24 NOTE — Progress Notes (Signed)
HOSPITAL DISCHARGE SUMMARY  Christine [name]  MRN: [id number]  DOB:[birth year]  Date of Admission: 10/19/2011 Date of Discharge: 10/24/2011         LOS: 5 days   Attending Physician:Christine [name]  Patient's Christine [name]  Consults:  Interventional radiology for IVC filter placement  Discharge Diagnoses: Present on Admission:  .Coagulopathy, supratheraputic INR .Hematuria .Acute on chronic renal failure .Diabetes mellitus, diet controlled .UTI (lower urinary tract infection)   Current Discharge Medication List    START taking these medications   Details  cefUROXime (CEFTIN) 500 MG tablet Take 1 tablet (500 mg total) by mouth 2 (two) times daily. Qty: 10 tablet, Refills: 0      CONTINUE these medications which have NOT CHANGED   Details  amLODipine (NORVASC) 10 MG tablet Take 10 mg by mouth daily.     ezetimibe (ZETIA) 10 MG tablet Take 10 mg by mouth daily.     furosemide (LASIX) 40 MG tablet Take 1 tablet (40 mg total) by mouth 2 (two) times daily. Qty: 60 tablet, Refills: 1    HYDROcodone-acetaminophen (NORCO) 10-325 MG per tablet Take 1 tablet by mouth every 6 (six) hours as needed. For pain.     metoCLOPramide (REGLAN) 10 MG tablet Take 10 mg by mouth 4 (four) times daily as needed. For nausea. Patient states that she takes once [name] day as needed.    metoprolol succinate (TOPROL-XL) 25 MG 24 hr tablet Take 25 mg by mouth daily.      zolpidem (AMBIEN) 5 MG tablet Take 5-10 mg by mouth at bedtime as needed. For sleep.       STOP taking these medications     warfarin (COUMADIN) 4 MG tablet          Brief Admission History: Patient is [name] 78 year old woman history of hypertension, chronic kidney disease, morbid obesity with lymphedema, PE in 07/2011 currently on Coumadin, recently admitted for heart failure who presents for evaluation of hematuria. The patient reports that she was in her usual state of health until earlier today when she noted gross  hematuria upon micturition. Reports mild associated dysuria at the end of the stream. No associated fevers or chills. No nausea or vomiting. INR has been fluctuating, supratherapeutic on most recent admission with patient discharged on 10/06/2011 for heart failure exacerbation. The patient reports that she has been taking her medications as directed since the discharge with no changes in the interim. In addition, the son taking care of the patient does report that the patient has been having [name] worsening cough recently, which was the presenting symptom for the heart failure exacerbation the last time. She also has been having problems with lightheadedness upon ambulation, with little to no urine usually with urination.  In the emergency room, temperature 99.0, blood pressure 101/51, heart rate 92, respirations 18, satting 98% on room air. IRN 5.97. Hemoglobin noted to be stable at approximately 10.0. Creatinine 2.05 from 1.3 at discharge from last admission. Urinalysis grossly bloody with large leukocytes and many bacteria. The patient was started on ceftriaxone and admitted for further evaluation and treatment.  Hospital Course: Present on Admission:  .Coagulopathy, supratheraputic INR .Hematuria .Acute on chronic renal failure .Diabetes mellitus, diet controlled .UTI (lower urinary tract infection)  1. Hematuria: This is likely secondary to [name] supratherapeutic INR. When patient came in the hospital her INR was 5.9. It went up to 6.2. Also patient has UTI which might contributed to the hematuria. After the patient admitted to the hospital,  her warfarin was discontinued after the INR was corrected. In 3 days. The hematuria was slowing down, on the day before discharge patient probably catheter was withdrawn patient has minimal blood. On the day of discharge her urine was clearer.  2. Coagulopathy: Patient is on Coumadin for PE diagnosed in October 2012. Patient given [name] supratherapeutic INR. Coumadin was  stopped.  3. Recent history of PE: In 07/2011, patient was on Coumadin apparently for 2 months, then she develops the hematuria. At the time of admission the Coumadin held. Patient had IVC filter placed on 10/22/2011. I did not restart the Coumadin. Coumadin can be restarted if it's felt necessary by primary care physician but patient has IVC filter now, please note the patient has history of uterine carcinosarcoma. He IVC filter should be retrieved as soon as possible also.  4. Escherichia coli UTI: This is been resistant to flora quinolones. Patient also with [name] CT scan of abdomen showed mild might have airspace disease. The patient was placed on Ceftin at the time of discharge. The airspace disease showed on the CT scan I think this is might be secondary to the previous pneumonia and fluid overload.  5. Anemia, acute blood loss: Hemoglobin was 10.1 on the day of admission and went down to 8.4. Patient had 2 units of RBCs transfused. With hemoglobin went up to 10.6 after that. This is thought to be secondary to acute blood loss.  6. History of uterine cancer: Endometrial high-grade carcinosarcoma. Patient to followup with radiation oncology and her primary care physician and oncologist as outpatient.  Day of Discharge BP 109/63  Pulse 80  Temp(Src) 98.7 F (37.1 C) (Oral)  Resp 18  Ht 5\' 6"  (1.676 m)  Wt 145.605 kg (321 lb)  BMI 51.81 kg/m2  SpO2 96% Physical Exam: GEN: No acute distress, cooperative with exam PSYCH: He is alert and oriented x4; does not appear anxious does not appear depressed; affect is normal  HEENT: Mucous membranes pink and anicteric;  Mouth: without oral thrush or lesions Eyes: PERRLA; EOM intact;  Neck: no cervical lymphadenopathy nor thyromegaly or carotid bruit; no JVD;  CHEST WALL: No tenderness, symmetrical to breathing bilaterally CHEST: Normal respiration, clear to auscultation bilaterally  HEART: Regular rate and rhythm; no murmurs, rubs or gallops, S1 and  S2 heard  BACK: No kyphosis or scoliosis; no CVA tenderness  ABDOMEN:  soft non-tender; no masses, no organomegaly, normal abdominal bowel sounds; no pannus; no intertriginous candida.  EXTREMITIES: No bone or joint deformity; no edema; no ulcerations.  PULSES: 2+ and symmetric, neurovascularity is intact SKIN: Normal hydration no rash or ulceration, no flushing or suspicious lesions  CNS: Cranial nerves 2-12 grossly intact no focal neurologic deficit, coordination is intact gait not tested    Results for orders placed during the hospital encounter of 10/19/11 (from the past 24 hour(s))  PRO B NATRIURETIC PEPTIDE     Status: Normal   Collection Time   10/24/11  3:38 AM      Component Value Range   Pro B Natriuretic peptide (BNP) 314.4  0 - 450 (pg/mL)  MAGNESIUM     Status: Normal   Collection Time   10/24/11  3:38 AM      Component Value Range   Magnesium 2.0  1.5 - 2.5 (mg/dL)  PROTIME-INR     Status: Normal   Collection Time   10/24/11  3:38 AM      Component Value Range   Prothrombin Time 14.3  11.6 -  15.2 (seconds)   INR 1.09  0.00 - 1.49   CBC     Status: Abnormal   Collection Time   10/24/11  3:38 AM      Component Value Range   WBC 9.0  4.0 - 10.5 (K/uL)   RBC 3.55 (*) 3.87 - 5.11 (MIL/uL)   Hemoglobin 10.4 (*) 12.0 - 15.0 (g/dL)   HCT 16.1 (*) 09.6 - 46.0 (%)   MCV 90.7  78.0 - 100.0 (fL)   MCH 29.3  26.0 - 34.0 (pg)   MCHC 32.3  30.0 - 36.0 (g/dL)   RDW 04.5  40.9 - 81.1 (%)   Platelets 308  150 - 400 (K/uL)  BASIC METABOLIC PANEL     Status: Abnormal   Collection Time   10/24/11  3:38 AM      Component Value Range   Sodium 136  135 - 145 (mEq/L)   Potassium 4.0  3.5 - 5.1 (mEq/L)   Chloride 98  96 - 112 (mEq/L)   CO2 27  19 - 32 (mEq/L)   Glucose, Bld 95  70 - 99 (mg/dL)   BUN 33 (*) 6 - 23 (mg/dL)   Creatinine, Ser 9.14 (*) 0.50 - 1.10 (mg/dL)   Calcium 9.2  8.4 - 78.2 (mg/dL)   GFR calc non Af Amer 30 (*) >90 (mL/min)   GFR calc Af Amer 35 (*) >90 (mL/min)     Disposition: Home with home health services   Follow-up Appts: Discharge Orders    Future Appointments: Provider: Department: Dept Phone: Center:   11/21/2011 11:30 AM Paola [name]. Duard Brady, [name] [phone] None   03/17/2012 11:15 AM Billie Lade, [name] [phone] None     Future Orders Please Complete By Expires   Diet - low sodium heart healthy      Increase activity slowly      Home Health      Questions: Responses:   To provide the following care/treatments PT    OT    RN      Follow-up Information    Follow up with [facility].. Schedule an appointment as soon as possible for [name] visit in 1 week.   Contact information:   [facility] [address] [place], [place] [address] Phone: [phone] Fax: [phone]         I spent 40 minutes completing paperwork and coordinating discharge efforts.  SignedClydia Llano [name] 10/24/2011, 12:13 PM

## 2011-10-24 NOTE — Progress Notes (Signed)
10-24-11 Faxed HHC orders, dc summary, face sheet and history and physical to [facility] agency.Ms Benevides will continue to have [facility] PT/OT services with added RN services thru [facility]. Faxed confirmation received. [address], RN,BSN, [place] [phone]

## 2011-10-24 NOTE — Discharge Summary (Signed)
HOSPITAL DISCHARGE SUMMARY  INOLA LISLE  MRN: [id number]  DOB:09-Nov-1930  Date of Admission: 10/19/2011 Date of Discharge: 10/24/2011         LOS: 5 days   Attending Physician:Zyrah Wiswell A  Patient's NWG:NFAOZHY,QMVHQION R., MD  Consults:  Interventional radiology for IVC filter placement  Discharge Diagnoses: Present on Admission:  .Coagulopathy, supratheraputic INR .Hematuria .Acute on chronic renal failure .Diabetes mellitus, diet controlled .UTI (lower urinary tract infection)   Current Discharge Medication List    START taking these medications   Details  cefUROXime (CEFTIN) 500 MG tablet Take 1 tablet (500 mg total) by mouth 2 (two) times daily. Qty: 10 tablet, Refills: 0      CONTINUE these medications which have NOT CHANGED   Details  amLODipine (NORVASC) 10 MG tablet Take 10 mg by mouth daily.     ezetimibe (ZETIA) 10 MG tablet Take 10 mg by mouth daily.     furosemide (LASIX) 40 MG tablet Take 1 tablet (40 mg total) by mouth 2 (two) times daily. Qty: 60 tablet, Refills: 1    HYDROcodone-acetaminophen (NORCO) 10-325 MG per tablet Take 1 tablet by mouth every 6 (six) hours as needed. For pain.     metoCLOPramide (REGLAN) 10 MG tablet Take 10 mg by mouth 4 (four) times daily as needed. For nausea. Patient states that she takes once a day as needed.    metoprolol succinate (TOPROL-XL) 25 MG 24 hr tablet Take 25 mg by mouth daily.      zolpidem (AMBIEN) 5 MG tablet Take 5-10 mg by mouth at bedtime as needed. For sleep.       STOP taking these medications     warfarin (COUMADIN) 4 MG tablet          Brief Admission History: Patient is a 78 year old woman history of hypertension, chronic kidney disease, morbid obesity with lymphedema, PE in 07/2011 currently on Coumadin, recently admitted for heart failure who presents for evaluation of hematuria. The patient reports that she was in her usual state of health until earlier today when she noted gross  hematuria upon micturition. Reports mild associated dysuria at the end of the stream. No associated fevers or chills. No nausea or vomiting. INR has been fluctuating, supratherapeutic on most recent admission with patient discharged on 10/06/2011 for heart failure exacerbation. The patient reports that she has been taking her medications as directed since the discharge with no changes in the interim. In addition, the son taking care of the patient does report that the patient has been having a worsening cough recently, which was the presenting symptom for the heart failure exacerbation the last time. She also has been having problems with lightheadedness upon ambulation, with little to no urine usually with urination.  In the emergency room, temperature 99.0, blood pressure 101/51, heart rate 92, respirations 18, satting 98% on room air. IRN 5.97. Hemoglobin noted to be stable at approximately 10.0. Creatinine 2.05 from 1.3 at discharge from last admission. Urinalysis grossly bloody with large leukocytes and many bacteria. The patient was started on ceftriaxone and admitted for further evaluation and treatment.  Hospital Course: Present on Admission:  .Coagulopathy, supratheraputic INR .Hematuria .Acute on chronic renal failure .Diabetes mellitus, diet controlled .UTI (lower urinary tract infection)  1. Hematuria: This is likely secondary to a supratherapeutic INR. When patient came in the hospital her INR was 5.9. It went up to 6.2. Also patient has UTI which might contributed to the hematuria. After the patient admitted to the hospital,  her warfarin was discontinued after the INR was corrected. In 3 days. The hematuria was slowing down, on the day before discharge patient probably catheter was withdrawn patient has minimal blood. On the day of discharge her urine was clearer.  2. Coagulopathy: Patient is on Coumadin for PE diagnosed in October 2012. Patient given a supratherapeutic INR. Coumadin was  stopped.  3. Recent history of PE: In 07/2011, patient was on Coumadin apparently for 2 months, then she develops the hematuria. At the time of admission the Coumadin held. Patient had IVC filter placed on 10/22/2011. I did not restart the Coumadin. Coumadin can be restarted if it's felt necessary by primary care physician but patient has IVC filter now, please note the patient has history of uterine carcinosarcoma. He IVC filter should be retrieved as soon as possible also.  4. Escherichia coli UTI: This is been resistant to flora quinolones. Patient also with a CT scan of abdomen showed mild might have airspace disease. The patient was placed on Ceftin at the time of discharge. The airspace disease showed on the CT scan I think this is might be secondary to the previous pneumonia and fluid overload.  5. Anemia, acute blood loss: Hemoglobin was 10.1 on the day of admission and went down to 8.4. Patient had 2 units of RBCs transfused. With hemoglobin went up to 10.6 after that. This is thought to be secondary to acute blood loss.  6. History of uterine cancer: Endometrial high-grade carcinosarcoma. Patient to followup with radiation oncology and her primary care physician and oncologist as outpatient.  Day of Discharge BP 109/63  Pulse 80  Temp(Src) 98.7 F (37.1 C) (Oral)  Resp 18  Ht 5\' 6"  (1.676 m)  Wt 145.605 kg (321 lb)  BMI 51.81 kg/m2  SpO2 96% Physical Exam: GEN: No acute distress, cooperative with exam PSYCH: He is alert and oriented x4; does not appear anxious does not appear depressed; affect is normal  HEENT: Mucous membranes pink and anicteric;  Mouth: without oral thrush or lesions Eyes: PERRLA; EOM intact;  Neck: no cervical lymphadenopathy nor thyromegaly or carotid bruit; no JVD;  CHEST WALL: No tenderness, symmetrical to breathing bilaterally CHEST: Normal respiration, clear to auscultation bilaterally  HEART: Regular rate and rhythm; no murmurs, rubs or gallops, S1 and  S2 heard  BACK: No kyphosis or scoliosis; no CVA tenderness  ABDOMEN:  soft non-tender; no masses, no organomegaly, normal abdominal bowel sounds; no pannus; no intertriginous candida.  EXTREMITIES: No bone or joint deformity; no edema; no ulcerations.  PULSES: 2+ and symmetric, neurovascularity is intact SKIN: Normal hydration no rash or ulceration, no flushing or suspicious lesions  CNS: Cranial nerves 2-12 grossly intact no focal neurologic deficit, coordination is intact gait not tested    Results for orders placed during the hospital encounter of 10/19/11 (from the past 24 hour(s))  PRO B NATRIURETIC PEPTIDE     Status: Normal   Collection Time   10/24/11  3:38 AM      Component Value Range   Pro B Natriuretic peptide (BNP) 314.4  0 - 450 (pg/mL)  MAGNESIUM     Status: Normal   Collection Time   10/24/11  3:38 AM      Component Value Range   Magnesium 2.0  1.5 - 2.5 (mg/dL)  PROTIME-INR     Status: Normal   Collection Time   10/24/11  3:38 AM      Component Value Range   Prothrombin Time 14.3  11.6 -  15.2 (seconds)   INR 1.09  0.00 - 1.49   CBC     Status: Abnormal   Collection Time   10/24/11  3:38 AM      Component Value Range   WBC 9.0  4.0 - 10.5 (K/uL)   RBC 3.55 (*) 3.87 - 5.11 (MIL/uL)   Hemoglobin 10.4 (*) 12.0 - 15.0 (g/dL)   HCT 16.1 (*) 09.6 - 46.0 (%)   MCV 90.7  78.0 - 100.0 (fL)   MCH 29.3  26.0 - 34.0 (pg)   MCHC 32.3  30.0 - 36.0 (g/dL)   RDW 04.5  40.9 - 81.1 (%)   Platelets 308  150 - 400 (K/uL)  BASIC METABOLIC PANEL     Status: Abnormal   Collection Time   10/24/11  3:38 AM      Component Value Range   Sodium 136  135 - 145 (mEq/L)   Potassium 4.0  3.5 - 5.1 (mEq/L)   Chloride 98  96 - 112 (mEq/L)   CO2 27  19 - 32 (mEq/L)   Glucose, Bld 95  70 - 99 (mg/dL)   BUN 33 (*) 6 - 23 (mg/dL)   Creatinine, Ser 9.14 (*) 0.50 - 1.10 (mg/dL)   Calcium 9.2  8.4 - 78.2 (mg/dL)   GFR calc non Af Amer 30 (*) >90 (mL/min)   GFR calc Af Amer 35 (*) >90 (mL/min)     Disposition: Home with home health services   Follow-up Appts: Discharge Orders    Future Appointments: Provider: Department: Dept Phone: Center:   11/21/2011 11:30 AM Paola A. Duard Brady, MD [facility] [phone] None   03/17/2012 11:15 AM Billie Lade, MD [facility] [phone] None     Future Orders Please Complete By Expires   Diet - low sodium heart healthy      Increase activity slowly      Home Health      Questions: Responses:   To provide the following care/treatments PT    OT    RN      Follow-up Information    Follow up with [facility] Schedule an appointment as soon as possible for a visit in 1 week.   Contact information:   [facility] [address] [place], [place] [address] Phone: [phone] Fax: [phone]         I spent 40 minutes completing paperwork and coordinating discharge efforts.  SignedClydia Llano A 10/24/2011, 12:27 PM

## 2011-10-26 NOTE — ED Provider Notes (Signed)
Evaluation and management procedures were performed by the PA/NP under my supervision/collaboration.    Felisa Bonier, MD 10/26/11 1630

## 2011-11-01 ENCOUNTER — Encounter (HOSPITAL_COMMUNITY): Payer: Self-pay | Admitting: Emergency Medicine

## 2011-11-01 ENCOUNTER — Inpatient Hospital Stay (HOSPITAL_COMMUNITY): Payer: Medicare Other

## 2011-11-01 ENCOUNTER — Emergency Department (HOSPITAL_COMMUNITY): Payer: Medicare Other

## 2011-11-01 ENCOUNTER — Inpatient Hospital Stay (HOSPITAL_COMMUNITY)
Admission: EM | Admit: 2011-11-01 | Discharge: 2011-11-05 | DRG: 684 | Disposition: A | Discharge status: Skilled Nursing Facility | Payer: Medicare Other | Attending: Internal Medicine | Admitting: Internal Medicine

## 2011-11-01 ENCOUNTER — Other Ambulatory Visit: Payer: Self-pay

## 2011-11-01 DIAGNOSIS — D72829 Elevated white blood cell count, unspecified: Secondary | ICD-10-CM

## 2011-11-01 DIAGNOSIS — I5033 Acute on chronic diastolic (congestive) heart failure: Secondary | ICD-10-CM | POA: Diagnosis present

## 2011-11-01 DIAGNOSIS — I890 Lymphedema, not elsewhere classified: Secondary | ICD-10-CM

## 2011-11-01 DIAGNOSIS — M1990 Unspecified osteoarthritis, unspecified site: Secondary | ICD-10-CM

## 2011-11-01 DIAGNOSIS — I129 Hypertensive chronic kidney disease with stage 1 through stage 4 chronic kidney disease, or unspecified chronic kidney disease: Secondary | ICD-10-CM | POA: Diagnosis present

## 2011-11-01 DIAGNOSIS — J189 Pneumonia, unspecified organism: Secondary | ICD-10-CM

## 2011-11-01 DIAGNOSIS — I959 Hypotension, unspecified: Secondary | ICD-10-CM | POA: Diagnosis present

## 2011-11-01 DIAGNOSIS — I2699 Other pulmonary embolism without acute cor pulmonale: Secondary | ICD-10-CM

## 2011-11-01 DIAGNOSIS — N182 Chronic kidney disease, stage 2 (mild): Secondary | ICD-10-CM

## 2011-11-01 DIAGNOSIS — I10 Essential (primary) hypertension: Secondary | ICD-10-CM

## 2011-11-01 DIAGNOSIS — C55 Malignant neoplasm of uterus, part unspecified: Secondary | ICD-10-CM | POA: Diagnosis present

## 2011-11-01 DIAGNOSIS — N390 Urinary tract infection, site not specified: Secondary | ICD-10-CM

## 2011-11-01 DIAGNOSIS — D689 Coagulation defect, unspecified: Secondary | ICD-10-CM

## 2011-11-01 DIAGNOSIS — IMO0001 Reserved for inherently not codable concepts without codable children: Secondary | ICD-10-CM | POA: Diagnosis present

## 2011-11-01 DIAGNOSIS — N289 Disorder of kidney and ureter, unspecified: Secondary | ICD-10-CM

## 2011-11-01 DIAGNOSIS — N179 Acute kidney failure, unspecified: Principal | ICD-10-CM | POA: Diagnosis present

## 2011-11-01 DIAGNOSIS — R319 Hematuria, unspecified: Secondary | ICD-10-CM | POA: Diagnosis present

## 2011-11-01 DIAGNOSIS — I5031 Acute diastolic (congestive) heart failure: Secondary | ICD-10-CM

## 2011-11-01 DIAGNOSIS — N189 Chronic kidney disease, unspecified: Secondary | ICD-10-CM | POA: Diagnosis present

## 2011-11-01 DIAGNOSIS — E119 Type 2 diabetes mellitus without complications: Secondary | ICD-10-CM | POA: Diagnosis present

## 2011-11-01 DIAGNOSIS — G8929 Other chronic pain: Secondary | ICD-10-CM

## 2011-11-01 DIAGNOSIS — K219 Gastro-esophageal reflux disease without esophagitis: Secondary | ICD-10-CM

## 2011-11-01 DIAGNOSIS — Z86711 Personal history of pulmonary embolism: Secondary | ICD-10-CM

## 2011-11-01 DIAGNOSIS — R531 Weakness: Secondary | ICD-10-CM

## 2011-11-01 LAB — GLUCOSE, CAPILLARY: Glucose-Capillary: 80 mg/dL (ref 70–99)

## 2011-11-01 LAB — URINE MICROSCOPIC-ADD ON

## 2011-11-01 LAB — URINALYSIS, ROUTINE W REFLEX MICROSCOPIC
Glucose, UA: NEGATIVE mg/dL
Glucose, UA: NEGATIVE mg/dL
Ketones, ur: NEGATIVE mg/dL
Protein, ur: NEGATIVE mg/dL
Protein, ur: NEGATIVE mg/dL
Urobilinogen, UA: 0.2 mg/dL (ref 0.0–1.0)
pH: 6.5 (ref 5.0–8.0)

## 2011-11-01 LAB — PROTIME-INR
INR: 1.23 (ref 0.00–1.49)
INR: 1.22 (ref 0.00–1.49)
Prothrombin Time: 15.8 seconds — ABNORMAL HIGH (ref 11.6–15.2)

## 2011-11-01 LAB — COMPREHENSIVE METABOLIC PANEL
CO2: 27 mEq/L (ref 19–32)
Calcium: 9.6 mg/dL (ref 8.4–10.5)
Creatinine, Ser: 2.36 mg/dL — ABNORMAL HIGH (ref 0.50–1.10)
GFR calc Af Amer: 22 mL/min — ABNORMAL LOW (ref >90)
GFR calc non Af Amer: 19 mL/min — ABNORMAL LOW (ref >90)
Glucose, Bld: 103 mg/dL — ABNORMAL HIGH (ref 70–99)

## 2011-11-01 LAB — CULTURE, BLOOD (ROUTINE X 2)
Culture  Setup Time: 201301171422
Culture  Setup Time: 201301180238
Culture  Setup Time: 201301180239
Culture: NO GROWTH
Culture: NO GROWTH

## 2011-11-01 LAB — DIFFERENTIAL
Lymphocytes Relative: 13 % (ref 12–46)
Lymphs Abs: 1.4 10*3/uL (ref 0.7–4.0)
Monocytes Relative: 12 % (ref 3–12)
Neutro Abs: 7.7 10*3/uL (ref 1.7–7.7)
Neutrophils Relative %: 71 % (ref 43–77)

## 2011-11-01 LAB — URINE CULTURE
Colony Count: NO GROWTH
Culture: NO GROWTH

## 2011-11-01 LAB — CBC
Hemoglobin: 11.4 g/dL — ABNORMAL LOW (ref 12.0–15.0)
RBC: 3.81 MIL/uL — ABNORMAL LOW (ref 3.87–5.11)

## 2011-11-01 LAB — CARDIAC PANEL(CRET KIN+CKTOT+MB+TROPI)
CK, MB: 2.1 ng/mL (ref 0.3–4.0)
Relative Index: 1.3 (ref 0.0–2.5)
Troponin I: <0.3 ng/mL (ref <0.30)

## 2011-11-01 LAB — POCT I-STAT TROPONIN I: Troponin i, poc: 0.02 ng/mL (ref 0.00–0.08)

## 2011-11-01 LAB — APTT: aPTT: 36 seconds (ref 24–37)

## 2011-11-01 LAB — LIPASE, BLOOD: Lipase: 16 U/L (ref 11–59)

## 2011-11-01 LAB — PRO B NATRIURETIC PEPTIDE: Pro B Natriuretic peptide (BNP): 464.5 pg/mL — ABNORMAL HIGH (ref 0–450)

## 2011-11-01 MED ORDER — LEVALBUTEROL HCL 0.63 MG/3ML IN NEBU
0.6300 mg | INHALATION_SOLUTION | Freq: Four times a day (QID) | RESPIRATORY_TRACT | Status: DC
  Administered 2011-11-01 – 2011-11-02 (×4): 0.63 mg via RESPIRATORY_TRACT
  Filled 2011-11-01 (×11): qty 3

## 2011-11-01 MED ORDER — ZOLPIDEM TARTRATE 5 MG PO TABS
5.0000 mg | ORAL_TABLET | Freq: Every evening | ORAL | Status: DC | PRN
  Administered 2011-11-01: 5 mg via ORAL
  Filled 2011-11-01: qty 1

## 2011-11-01 MED ORDER — ONDANSETRON HCL 4 MG/2ML IJ SOLN: 4.0000 mg | Freq: Four times a day (QID) | INTRAMUSCULAR | Status: DC | PRN

## 2011-11-01 MED ORDER — VANCOMYCIN HCL 1000 MG IV SOLR
2000.0000 mg | INTRAVENOUS | Status: DC
  Administered 2011-11-01: 2000 mg via INTRAVENOUS
  Filled 2011-11-01: qty 2000

## 2011-11-01 MED ORDER — SODIUM CHLORIDE 0.9 % IV SOLN
INTRAVENOUS | Status: DC
  Administered 2011-11-01 – 2011-11-05 (×8): via INTRAVENOUS

## 2011-11-01 MED ORDER — ONDANSETRON HCL 4 MG PO TABS
4.0000 mg | ORAL_TABLET | Freq: Four times a day (QID) | ORAL | Status: DC | PRN
  Filled 2011-11-01: qty 1

## 2011-11-01 MED ORDER — EZETIMIBE 10 MG PO TABS
10.0000 mg | ORAL_TABLET | Freq: Every day | ORAL | Status: DC
  Administered 2011-11-01 – 2011-11-05 (×5): 10 mg via ORAL
  Filled 2011-11-01 (×5): qty 1

## 2011-11-01 MED ORDER — SODIUM CHLORIDE 0.9 % IV SOLN: INTRAVENOUS | Status: DC

## 2011-11-01 MED ORDER — ACETAMINOPHEN 650 MG RE SUPP: 650.0000 mg | Freq: Four times a day (QID) | RECTAL | Status: DC | PRN

## 2011-11-01 MED ORDER — SODIUM CHLORIDE 0.9 % IV SOLN
INTRAVENOUS | Status: DC
  Administered 2011-11-01: 11:00:00 via INTRAVENOUS

## 2011-11-01 MED ORDER — PIPERACILLIN-TAZOBACTAM 4.5 G IVPB: 4.5000 g | Freq: Once | INTRAVENOUS | Status: DC

## 2011-11-01 MED ORDER — ACETAMINOPHEN 325 MG PO TABS: 650.0000 mg | ORAL_TABLET | Freq: Four times a day (QID) | ORAL | Status: DC | PRN

## 2011-11-01 MED ORDER — PIPERACILLIN-TAZOBACTAM 3.375 G IVPB
3.3750 g | Freq: Three times a day (TID) | INTRAVENOUS | Status: DC
  Administered 2011-11-01 – 2011-11-02 (×3): 3.375 g via INTRAVENOUS
  Filled 2011-11-01 (×6): qty 50

## 2011-11-01 MED ORDER — SODIUM CHLORIDE 0.9 % IR SOLN
3000.0000 mL | Status: DC
  Administered 2011-11-01: 3000 mL

## 2011-11-01 MED ORDER — VANCOMYCIN HCL IN DEXTROSE 1-5 GM/200ML-% IV SOLN
1000.0000 mg | Freq: Once | INTRAVENOUS | Status: DC
  Filled 2011-11-01: qty 200

## 2011-11-01 MED ORDER — PIPERACILLIN SOD-TAZOBACTAM SO 4.5 (4-0.5) G IV SOLR
Freq: Once | INTRAVENOUS | Status: DC
  Filled 2011-11-01: qty 100

## 2011-11-01 MED ORDER — METOPROLOL SUCCINATE ER 25 MG PO TB24
25.0000 mg | ORAL_TABLET | Freq: Every day | ORAL | Status: DC
  Administered 2011-11-01 – 2011-11-05 (×5): 25 mg via ORAL
  Filled 2011-11-01 (×5): qty 1

## 2011-11-01 MED ORDER — SODIUM CHLORIDE 0.9 % IV BOLUS (SEPSIS)
250.0000 mL | Freq: Once | INTRAVENOUS | Status: AC
  Administered 2011-11-01: 250 mL via INTRAVENOUS

## 2011-11-01 MED ORDER — HYDROCODONE-ACETAMINOPHEN 10-325 MG PO TABS
1.0000 | ORAL_TABLET | Freq: Four times a day (QID) | ORAL | Status: DC | PRN
  Administered 2011-11-02 – 2011-11-04 (×8): 1 via ORAL
  Filled 2011-11-01 (×8): qty 1

## 2011-11-01 MED ORDER — PNEUMOCOCCAL VAC POLYVALENT 25 MCG/0.5ML IJ INJ
0.5000 mL | INJECTION | INTRAMUSCULAR | Status: AC | PRN
  Administered 2011-11-05: 0.5 mL via INTRAMUSCULAR
  Filled 2011-11-01: qty 0.5

## 2011-11-01 NOTE — Progress Notes (Signed)
ANTIBIOTIC CONSULT NOTE - INITIAL  Pharmacy Consult for Vancomycin and Zosyn Indication: UTI  No Known Allergies  Patient Measurements: Height: 5\' 6"  (167.6 cm) Weight: 328 lb 0.7 oz (148.8 kg) IBW/kg (Calculated) : 59.3  Adjusted Body Weight: 95.1  Vital Signs: Temp: 98.2 F (36.8 C) (01/17 1402) Temp src: Oral (01/17 1402) BP: 97/64 mmHg (01/17 1402) Pulse Rate: 92  (01/17 1402)  Labs:  Basename 11/01/11 1020  WBC 10.8*  HGB 11.4*  PLT 472*  LABCREA --  CREATININE 2.36*   Estimated Creatinine Clearance: 29.5 ml/min (by C-G formula based on Cr of 2.36).  Microbiology: Recent Results (from the past 720 hour(s))  URINE CULTURE     Status: Normal   Collection Time   10/20/11  1:34 AM      Component Value Range Status Comment   Specimen Description URINE, CATHETERIZED   Final    Special Requests NONE   Final    Setup Time [id number]   Final    Colony Count >=100,000 COLONIES/ML   Final    Culture ESCHERICHIA COLI   Final    Report Status 10/22/2011 FINAL   Final    Organism ID, Bacteria ESCHERICHIA COLI   Final     Medical History: Past Medical History  Diagnosis Date  . GERD (gastroesophageal reflux disease)   . Hypertension   . Diabetes mellitus     diet controlled  . Morbid obesity   . Arthritis   . Chronic kidney disease     nephrolithiasis left kidney,s/p open resection  . Status post arthroscopic surgery of right knee   . Diverticulosis of sigmoid colon   . Chronic back pain   . Pneumonia 07/2011    hx  . Pulmonary embolism 07/2011    hx  . Uterine cancer     endometrial high grade adenocarcinoma  . Uterus cancer, sarcoma     high grade invasive carcinosarcoma    Medications:  Anti-infectives     Start     Dose/Rate Route Frequency Ordered Stop   11/01/11 1300   vancomycin (VANCOCIN) IVPB 1000 mg/200 mL premix        1,000 mg 200 mL/hr over 60 Minutes Intravenous  Once 11/01/11 1146     11/01/11 1300   dextrose 5 % 100 mL with  piperacillin-tazobactam (ZOSYN) 4.5 g infusion  Status:  Discontinued        200 mL/hr  Intravenous  Once 11/01/11 1206 11/01/11 1424   11/01/11 1200   piperacillin-tazobactam (ZOSYN) IVPB 4.5 g  Status:  Discontinued        4.5 g 200 mL/hr over 30 Minutes Intravenous  Once 11/01/11 1146 11/01/11 1156         Assessment: 78 yo F with recent admission (d/c on 11/03/11) at [facility] hospital, admit to [facility] with hematuria and dysuria Urine cxt from 10/20/11 grew Ecoli and was treated with ceftriaxone and then cefuroxime Blood and urine cultures 1/17 pending Acute on chronic renal insufficiency with CrCl (n) ~ 26  Goal of Therapy:  Zosyn per renal function Vancomycin trough level 15-20 mcg/ml  Plan:  Zosyn 3.375g IV Q 8 hours, each over 4 hours, extended infusion Vancomycin 2g IV Q 48 hours Measure antibiotic drug levels at steady state Follow up culture results, renal function, and labs as available.  Lynann Beaver PharmD  Pager [phone] 11/01/2011 2:42 PM

## 2011-11-01 NOTE — ED Notes (Signed)
Pt states that she was here a  Few weeks ago and had a stent placed to rt side of neck and was taken off coumadin due to her levels were high. Today she had dizzyness and voided passing a large amount of clots and blood in the toliet. Bp has been low, has abd pain with cramping alert x4, has been off coumadin for 2 weeks on bp meds.

## 2011-11-01 NOTE — Consult Note (Signed)
Urology Consult  Referring physician: Glenetta Borg Reason for referral: Hematuria  Chief Complaint: Gross hematuria  History of Present Illness: Patient is a 78 years old female who was admitted this morning with a history of hematuria. She states that she noticed  some blood clot in the commode. She is not sure whether it came from the urine or the vagina. She was discharged from the hospital on January 9 for gross hematuria. She has been on Coumadin and it was thought that then the hematuria was secondary to the blood thinner. CT scan about  2 weeks ago showed some surgical clips in the area of the left kidney and right renal cysts with a focal calcification in the  inferior aspect of the cyst. She had a hysterectomy in December 2009 for uterine cancer. She denies any other voiding symptoms. A Foley catheter inserted in the bladder drained grossly clear urine. Urinalysis showed  11-20 RBCs, 7-10 WBCs, few bacteria. Her creatinine is 2.36 and was 1.59 on January 9. She denies any flank pain. She does not have nausea or vomiting.  Past Medical History  Diagnosis Date  . GERD (gastroesophageal reflux disease)   . Hypertension   . Diabetes mellitus     diet controlled  . Morbid obesity   . Arthritis   . Chronic kidney disease     nephrolithiasis left kidney,s/p open resection  . Status post arthroscopic surgery of right knee   . Diverticulosis of sigmoid colon   . Chronic back pain   . Pneumonia 07/2011    hx  . Pulmonary embolism 07/2011    hx  . Uterine cancer     endometrial high grade adenocarcinoma  . Uterus cancer, sarcoma     high grade invasive carcinosarcoma   Past Surgical History  Procedure Date  . Abdominal hysterectomy     total with b/l salpingo-oopherectomy  . Colonoscopy     negative  . Knee arthroscopy     right knee    Medications: Amlodipine, Ceftin,Zetia, Lasix, hydrocodone/APAP, Reglan, metoprolol.  Allergies: No Known Allergies  Family History    Problem Relation Age of Onset  . Breast cancer Sister     1st sister  . Colon cancer Sister     2nd sister  . Cervical cancer Sister     3rd sister  Her mother died during childbirth. Her father died of heart disease. She has 2 brothers and 5 sisters. She has 5 children: 3 sons and 2 daughters.  Social History:  reports that she has never smoked. She has never used smokeless tobacco. She reports that she does not drink alcohol or use illicit drugs.  ROS: All systems are reviewed and negative except as noted.   Physical Exam:  Vital signs in last 24 hours: Temp:  [98.2 F (36.8 C)-98.5 F (36.9 C)] 98.2 F (36.8 C) (01/17 1402) Pulse Rate:  [84-120] 92  (01/17 1402) Resp:  [13-20] 20  (01/17 1402) BP: (97-132)/(48-68) 97/64 mmHg (01/17 1402) SpO2:  [97 %-100 %] 97 % (01/17 1402) Weight:  [328 lb 0.7 oz (148.8 kg)] 328 lb 0.7 oz (148.8 kg) (01/17 1359)  Cardiovascular: Skin warm; not flushed Respiratory: Breaths quiet; no shortness of breath Abdomen: No masses Neurological: Normal sensation to touch Musculoskeletal: Normal motor function arms and legs Lymphatics: No inguinal adenopathy Skin: No rashes Genitourinary:Normal female genitalia. She has an indwelling Foley that is draining clear urine.  CBI is running at this time.  The urine was clear at the  time of the catheter insertion.  Laboratory Data:  Results for orders placed during the hospital encounter of 11/01/11 (from the past 72 hour(s))  URINALYSIS, ROUTINE W REFLEX MICROSCOPIC     Status: Abnormal   Collection Time   11/01/11  9:20 AM      Component Value Range Comment   Color, Urine YELLOW  YELLOW     APPearance CLOUDY (*) CLEAR     Specific Gravity, Urine 1.014  1.005 - 1.030     pH 5.5  5.0 - 8.0     Glucose, UA NEGATIVE  NEGATIVE (mg/dL)    Hgb urine dipstick LARGE (*) NEGATIVE     Bilirubin Urine NEGATIVE  NEGATIVE     Ketones, ur NEGATIVE  NEGATIVE (mg/dL)    Protein, ur NEGATIVE  NEGATIVE (mg/dL)     Urobilinogen, UA 0.2  0.0 - 1.0 (mg/dL)    Nitrite NEGATIVE  NEGATIVE     Leukocytes, UA TRACE (*) NEGATIVE    URINE MICROSCOPIC-ADD ON     Status: Abnormal   Collection Time   11/01/11  9:20 AM      Component Value Range Comment   Squamous Epithelial / LPF FEW (*) RARE     WBC, UA 7-10  <3 (WBC/hpf)    RBC / HPF 11-20  <3 (RBC/hpf)    Bacteria, UA FEW (*) RARE     Urine-Other MUCOUS PRESENT     APTT     Status: Normal   Collection Time   11/01/11 10:20 AM      Component Value Range Comment   aPTT 36  24 - 37 (seconds)   PROTIME-INR     Status: Abnormal   Collection Time   11/01/11 10:20 AM      Component Value Range Comment   Prothrombin Time 15.8 (*) 11.6 - 15.2 (seconds)    INR 1.23  0.00 - 1.49    LACTIC ACID, PLASMA     Status: Normal   Collection Time   11/01/11 10:20 AM      Component Value Range Comment   Lactic Acid, Venous 1.7  0.5 - 2.2 (mmol/L)   LIPASE, BLOOD     Status: Normal   Collection Time   11/01/11 10:20 AM      Component Value Range Comment   Lipase 16  11 - 59 (U/L)   COMPREHENSIVE METABOLIC PANEL     Status: Abnormal   Collection Time   11/01/11 10:20 AM      Component Value Range Comment   Sodium 133 (*) 135 - 145 (mEq/L)    Potassium 4.8  3.5 - 5.1 (mEq/L)    Chloride 94 (*) 96 - 112 (mEq/L)    CO2 27  19 - 32 (mEq/L)    Glucose, Bld 103 (*) 70 - 99 (mg/dL)    BUN 40 (*) 6 - 23 (mg/dL)    Creatinine, Ser 6.44 (*) 0.50 - 1.10 (mg/dL)    Calcium 9.6  8.4 - 10.5 (mg/dL)    Total Protein 7.6  6.0 - 8.3 (g/dL)    Albumin 2.9 (*) 3.5 - 5.2 (g/dL)    AST 15  0 - 37 (U/L)    ALT 8  0 - 35 (U/L)    Alkaline Phosphatase 68  39 - 117 (U/L)    Total Bilirubin 0.3  0.3 - 1.2 (mg/dL)    GFR calc non Af Amer 19 (*) >90 (mL/min)    GFR calc Af Amer 22 (*) >90 (mL/min)  CBC     Status: Abnormal   Collection Time   11/01/11 10:20 AM      Component Value Range Comment   WBC 10.8 (*) 4.0 - 10.5 (K/uL)    RBC 3.81 (*) 3.87 - 5.11 (MIL/uL)    Hemoglobin 11.4  (*) 12.0 - 15.0 (g/dL)    HCT 40.9 (*) 81.1 - 46.0 (%)    MCV 92.4  78.0 - 100.0 (fL)    MCH 29.9  26.0 - 34.0 (pg)    MCHC 32.4  30.0 - 36.0 (g/dL)    RDW 91.4  78.2 - 95.6 (%)    Platelets 472 (*) 150 - 400 (K/uL)   DIFFERENTIAL     Status: Abnormal   Collection Time   11/01/11 10:20 AM      Component Value Range Comment   Neutrophils Relative 71  43 - 77 (%)    Neutro Abs 7.7  1.7 - 7.7 (K/uL)    Lymphocytes Relative 13  12 - 46 (%)    Lymphs Abs 1.4  0.7 - 4.0 (K/uL)    Monocytes Relative 12  3 - 12 (%)    Monocytes Absolute 1.3 (*) 0.1 - 1.0 (K/uL)    Eosinophils Relative 4  0 - 5 (%)    Eosinophils Absolute 0.4  0.0 - 0.7 (K/uL)    Basophils Relative 0  0 - 1 (%)    Basophils Absolute 0.0  0.0 - 0.1 (K/uL)   CARDIAC PANEL(CRET KIN+CKTOT+MB+TROPI)     Status: Normal   Collection Time   11/01/11 10:20 AM      Component Value Range Comment   Total CK 167  7 - 177 (U/L)    CK, MB 1.9  0.3 - 4.0 (ng/mL)    Troponin I <0.30  <0.30 (ng/mL)    Relative Index 1.1  0.0 - 2.5    POCT I-STAT TROPONIN I     Status: Normal   Collection Time   11/01/11 10:33 AM      Component Value Range Comment   Troponin i, poc 0.02  0.00 - 0.08 (ng/mL)    Comment 3            URINALYSIS, ROUTINE W REFLEX MICROSCOPIC     Status: Abnormal   Collection Time   11/01/11  3:03 PM      Component Value Range Comment   Color, Urine YELLOW  YELLOW     APPearance CLEAR  CLEAR     Specific Gravity, Urine 1.011  1.005 - 1.030     pH 6.5  5.0 - 8.0     Glucose, UA NEGATIVE  NEGATIVE (mg/dL)    Hgb urine dipstick LARGE (*) NEGATIVE     Bilirubin Urine NEGATIVE  NEGATIVE     Ketones, ur NEGATIVE  NEGATIVE (mg/dL)    Protein, ur NEGATIVE  NEGATIVE (mg/dL)    Urobilinogen, UA 0.2  0.0 - 1.0 (mg/dL)    Nitrite NEGATIVE  NEGATIVE     Leukocytes, UA TRACE (*) NEGATIVE    URINE MICROSCOPIC-ADD ON     Status: Abnormal   Collection Time   11/01/11  3:03 PM      Component Value Range Comment   Squamous Epithelial  / LPF FEW (*) RARE     WBC, UA 3-6  <3 (WBC/hpf)    RBC / HPF 3-6  <3 (RBC/hpf)    Bacteria, UA FEW (*) RARE     Urine-Other FEW YEAST     PRO  B NATRIURETIC PEPTIDE     Status: Abnormal   Collection Time   11/01/11  3:20 PM      Component Value Range Comment   Pro B Natriuretic peptide (BNP) 464.5 (*) 0 - 450 (pg/mL)   PROTIME-INR     Status: Abnormal   Collection Time   11/01/11  4:00 PM      Component Value Range Comment   Prothrombin Time 15.7 (*) 11.6 - 15.2 (seconds)    INR 1.22  0.00 - 1.49    GLUCOSE, CAPILLARY     Status: Normal   Collection Time   11/01/11  5:28 PM      Component Value Range Comment   Glucose-Capillary 80  70 - 99 (mg/dL)   CARDIAC PANEL(CRET KIN+CKTOT+MB+TROPI)     Status: Normal   Collection Time   11/01/11  6:00 PM      Component Value Range Comment   Total CK 167  7 - 177 (U/L)    CK, MB 2.1  0.3 - 4.0 (ng/mL)    Troponin I <0.30  <0.30 (ng/mL)    Relative Index 1.3  0.0 - 2.5     No results found for this or any previous visit (from the past 240 hour(s)). Creatinine:  Basename 11/01/11 1020  CREATININE 2.36*       Impression/Assessment:  Question gross hematuria. Possible vaginal bleeding. Diabetes. Hypertension. History of uterine cancer.  Plan: I do not think she needs the CBI. Would also replace 24 catheter with a #16 Foley. Cystoscopy for evaluation of the hematuria. That can be done as an outpatient if she is ready for discharge.   Yovany Clock-Schrum 11/01/2011, 8:01 PM

## 2011-11-01 NOTE — ED Provider Notes (Signed)
History     CSN: [id number]  Arrival date & time 11/01/11  9562  Chief Complaint  Patient presents with  . Dizziness  . Hematuria    HPI Pt was seen at 0825.  Per pt and son, c/o gradual onset and persistence of constant dysuria and hematuria that began this morning.  Pt also c/o gradual onset and worsening of persistent "lightheadedness" and generalized weakness since being discharged from the hospital approx 1 week ago for supratherapeutic INR, hematuria, UTI.  Pt had IVC placed during that admission.  Pt's generalized weakness and lightheadedness worsen on ambulation.  Pt states she has been taking her abx as rx.  Pt states her HHN told her that her SBP has been "low," in "100's" for the past several checks.  Denies CP/palpitations, no SOB/cough, no abd pain, no back pain, no N/V/D, no fevers, no rash.       Cards:  Dr. Allyson Sabal PMD:  Dr. Dolly Rias Past Medical History  Diagnosis Date  . GERD (gastroesophageal reflux disease)   . Hypertension   . Diabetes mellitus     diet controlled  . Morbid obesity   . Arthritis   . Chronic kidney disease     nephrolithiasis left kidney,s/p open resection  . Status post arthroscopic surgery of right knee   . Diverticulosis of sigmoid colon   . Chronic back pain   . Pneumonia 07/2011    hx  . Pulmonary embolism 07/2011    hx  . Uterine cancer     endometrial high grade adenocarcinoma  . Uterus cancer, sarcoma     high grade invasive carcinosarcoma    Past Surgical History  Procedure Date  . Abdominal hysterectomy     total with b/l salpingo-oopherectomy  . Colonoscopy     negative  . Knee arthroscopy     right knee    Family History  Problem Relation Age of Onset  . Breast cancer Sister     1st sister  . Colon cancer Sister     2nd sister  . Cervical cancer Sister     3rd sister    History  Substance Use Topics  . Smoking status: Never Smoker   . Smokeless tobacco: Not on file  . Alcohol Use: No    OB History    Grav Para Term Preterm Abortions TAB SAB Ect Mult Living   9 5              Review of Systems ROS: Statement: All systems negative except as marked or noted in the HPI; Constitutional: Negative for fever and chills. +generalized weakness ; ; Eyes: Negative for eye pain, redness and discharge. ; ; ENMT: Negative for ear pain, hoarseness, nasal congestion, sinus pressure and sore throat. ; ; Cardiovascular: Negative for chest pain, palpitations, diaphoresis, dyspnea and peripheral edema. ; ; Respiratory: Negative for cough, wheezing and stridor. ; ; Gastrointestinal: Negative for nausea, vomiting, diarrhea, abdominal pain, blood in stool, hematemesis, jaundice and rectal bleeding. . ; ; Genitourinary: Negative for flank pain; and +dysuria and hematuria.; Musculoskeletal: Negative for back pain and neck pain. Negative for swelling and trauma.; ; Skin: Negative for pruritus, rash, abrasions, blisters, bruising and skin lesion.; ; Neuro: +lightheadedness.  Negative for headache, and neck stiffness. Negative for weakness, altered level of consciousness , altered mental status, extremity weakness, paresthesias, involuntary movement, seizure and syncope.     Allergies  Review of patient's allergies indicates no known allergies.  Home Medications   Current  Outpatient Rx  Name Route Sig Dispense Refill  . AMLODIPINE BESYLATE 10 MG PO TABS Oral Take 10 mg by mouth daily.     [name] CEFUROXIME AXETIL 500 MG PO TABS Oral Take 1 tablet (500 mg total) by mouth 2 (two) times daily. 10 tablet 0  . EZETIMIBE 10 MG PO TABS Oral Take 10 mg by mouth daily.     . FUROSEMIDE 40 MG PO TABS Oral Take 1 tablet (40 mg total) by mouth 2 (two) times daily. 60 tablet 1  . HYDROCODONE-ACETAMINOPHEN 10-325 MG PO TABS Oral Take 1 tablet by mouth every 6 (six) hours as needed. For pain.     [name] METOCLOPRAMIDE HCL 10 MG PO TABS Oral Take 10 mg by mouth 4 (four) times daily as needed. For nausea. Patient states that she takes once a day  as needed.    [name] METOPROLOL SUCCINATE ER 25 MG PO TB24 Oral Take 25 mg by mouth daily.      [name] ZOLPIDEM TARTRATE 5 MG PO TABS Oral Take 5-10 mg by mouth at bedtime as needed. For sleep.       SpO2 100%  Physical Exam 0830: Physical examination:  Nursing notes reviewed; Vital signs and O2 SAT reviewed;  Constitutional: Well developed, Well nourished, In no acute distress; Head:  Normocephalic, atraumatic; Eyes: EOMI, PERRL, No scleral icterus; ENMT: Mouth and pharynx normal, Mucous membranes dry; Neck: Supple, Full range of motion, No lymphadenopathy; Cardiovascular: Regular rate and rhythm, No murmur or gallop; Respiratory: Breath sounds clear & equal bilaterally, No rales, rhonchi, wheezes, Speaking full sentences with ease., Normal respiratory effort/excursion; Chest: Nontender, Movement normal; Abdomen: Soft, Nontender, Nondistended, Normal bowel sounds; Extremities: Pulses normal, No tenderness, +bilat LE's lymphedema, No calf asymmetry.; Neuro: AA&Ox3, Major CN grossly intact. Speech clear, no facial droop. No gross focal motor deficits in extremities.; Skin: Color normal, Warm, Dry, no rash.   ED Course  Procedures  [phone]:  Pt's SBP 80 on arrival to ED, mentating well, resps easy.  Will give judicious IVF boluses/gtt given hx CHF.   0950:  Pt's SBP now 113 after small IVF bolus.  Rest of workup pending.   11:42 AM:  Cr elevated from previous.  H/H stable.  Currently taking Ceftin for UTI, UC is pending.  Afebrile here today, lactic acid normal, WBC mildly elevated.  Pt c/o lightheadedness on standing at the bedside for orthostatic VS, and became tachycardic (HR 90 to 120).  T/C to [facility] Dr. Susie Cassette, case discussed, including:  HPI, pertinent PM/SHx, VS/PE, dx testing, ED course and treatment.  Agreeable to admit.  Requests to obtain tele bed to team 1.  Also requests to obtain [facility] x2, and start IV vanc/zosyn.    MDM  MDM Reviewed: previous chart, nursing note and vitals Reviewed previous:  ECG and labs Interpretation: ECG, labs and x-ray Total time providing critical care: 30-74 minutes. This excludes time spent performing separately reportable procedures and services. Consults: admitting MD   CRITICAL CARE Performed by: Laray Anger Total critical care time: 31 Critical care time was exclusive of separately billable procedures and treating other patients. Critical care was necessary to treat or prevent imminent or life-threatening deterioration. Critical care was time spent personally by me on the following activities: development of treatment plan with patient and/or surrogate as well as nursing, discussions with consultants, evaluation of patient's response to treatment, examination of patient, obtaining history from patient or surrogate, ordering and performing treatments and interventions, ordering and review of laboratory studies,  ordering and review of radiographic studies, pulse oximetry and re-evaluation of patient's condition.   Date: 11/01/2011  Rate: 90  Rhythm: normal sinus rhythm  QRS Axis: left  Intervals: normal  ST/T Wave abnormalities: normal  Conduction Disutrbances:none  Narrative Interpretation:    Old EKG Reviewed: unchanged; no significant changes from previous EKG dated 09/29/2011.  Dg Abd Acute W/chest 11/01/2011  *RADIOLOGY REPORT*  Clinical Data: History of uterine cancer.  Vaginal bleeding  ACUTE ABDOMEN SERIES (ABDOMEN 2 VIEW & CHEST 1 VIEW)  Comparison: 10/20/2011  Findings: Heart size appears moderately enlarged. There is pulmonary venous congestion.  There is no pleural effusion or pulmonary edema.  Coarsened interstitial markings are noted bilaterally.  No airspace consolidation.  The patient has an IVC filter.  There is moderate stool within the colon up to the rectum.  No dilated loops of small bowel or fluid levels noted.  IMPRESSION:  1.  Cardiac enlargement and pulmonary venous congestion. 2.  Nonobstructive bowel gas pattern.   Original Report Authenticated By: Rosealee Albee, M.D.    Results for orders placed during the hospital encounter of 11/01/11  URINALYSIS, ROUTINE W REFLEX MICROSCOPIC      Component Value Range   Color, Urine YELLOW  YELLOW    APPearance CLOUDY (*) CLEAR    Specific Gravity, Urine 1.014  1.005 - 1.030    pH 5.5  5.0 - 8.0    Glucose, UA NEGATIVE  NEGATIVE (mg/dL)   Hgb urine dipstick LARGE (*) NEGATIVE    Bilirubin Urine NEGATIVE  NEGATIVE    Ketones, ur NEGATIVE  NEGATIVE (mg/dL)   Protein, ur NEGATIVE  NEGATIVE (mg/dL)   Urobilinogen, UA 0.2  0.0 - 1.0 (mg/dL)   Nitrite NEGATIVE  NEGATIVE    Leukocytes, UA TRACE (*) NEGATIVE   APTT      Component Value Range   aPTT 36  24 - 37 (seconds)  PROTIME-INR      Component Value Range   Prothrombin Time 15.8 (*) 11.6 - 15.2 (seconds)   INR 1.23  0.00 - 1.49   LACTIC ACID, PLASMA      Component Value Range   Lactic Acid, Venous 1.7  0.5 - 2.2 (mmol/L)  LIPASE, BLOOD      Component Value Range   Lipase 16  11 - 59 (U/L)  COMPREHENSIVE METABOLIC PANEL      Component Value Range   Sodium 133 (*) 135 - 145 (mEq/L)   Potassium 4.8  3.5 - 5.1 (mEq/L)   Chloride 94 (*) 96 - 112 (mEq/L)   CO2 27  19 - 32 (mEq/L)   Glucose, Bld 103 (*) 70 - 99 (mg/dL)   BUN 40 (*) 6 - 23 (mg/dL)   Creatinine, Ser 1.61 (*) 0.50 - 1.10 (mg/dL)   Calcium 9.6  8.4 - 09.6 (mg/dL)   Total Protein 7.6  6.0 - 8.3 (g/dL)   Albumin 2.9 (*) 3.5 - 5.2 (g/dL)   AST 15  0 - 37 (U/L)   ALT 8  0 - 35 (U/L)   Alkaline Phosphatase 68  39 - 117 (U/L)   Total Bilirubin 0.3  0.3 - 1.2 (mg/dL)   GFR calc non Af Amer 19 (*) >90 (mL/min)   GFR calc Af Amer 22 (*) >90 (mL/min)  CBC      Component Value Range   WBC 10.8 (*) 4.0 - 10.5 (K/uL)   RBC 3.81 (*) 3.87 - 5.11 (MIL/uL)   Hemoglobin 11.4 (*) 12.0 - 15.0 (g/dL)  HCT 35.2 (*) 36.0 - 46.0 (%)   MCV 92.4  78.0 - 100.0 (fL)   MCH 29.9  26.0 - 34.0 (pg)   MCHC 32.4  30.0 - 36.0 (g/dL)   RDW 40.9  81.1 - 91.4 (%)     Platelets 472 (*) 150 - 400 (K/uL)  DIFFERENTIAL      Component Value Range   Neutrophils Relative 71  43 - 77 (%)   Neutro Abs 7.7  1.7 - 7.7 (K/uL)   Lymphocytes Relative 13  12 - 46 (%)   Lymphs Abs 1.4  0.7 - 4.0 (K/uL)   Monocytes Relative 12  3 - 12 (%)   Monocytes Absolute 1.3 (*) 0.1 - 1.0 (K/uL)   Eosinophils Relative 4  0 - 5 (%)   Eosinophils Absolute 0.4  0.0 - 0.7 (K/uL)   Basophils Relative 0  0 - 1 (%)   Basophils Absolute 0.0  0.0 - 0.1 (K/uL)  URINE MICROSCOPIC-ADD ON      Component Value Range   Squamous Epithelial / LPF FEW (*) RARE    WBC, UA 7-10  <3 (WBC/hpf)   RBC / HPF 11-20  <3 (RBC/hpf)   Bacteria, UA FEW (*) RARE    Urine-Other MUCOUS PRESENT    POCT I-STAT TROPONIN I      Component Value Range   Troponin i, poc 0.02  0.00 - 0.08 (ng/mL)   Comment 3              Results for Christine [name], Christine [name] (MRN [id number]) as of 11/01/2011 11:22  Ref. Range 10/23/2011 09:35 10/24/2011 03:38 11/01/2011 10:20  BUN Latest Range: 6-23 mg/dL 36 (H) 33 (H) 40 (H)  Creat Latest Range: 0.50-1.10 mg/dL 0.86 (H) 5.78 (H) 4.69 (H)    Results for Christine [name], Christine [name] (MRN [id number]) as of 11/01/2011 11:22  Ref. Range 10/23/2011 09:35 10/24/2011 03:38 11/01/2011 10:20  HGB Latest Range: 12.0-15.0 g/dL 24.4 (L) 01.0 (L) 27.2 (L)  HCT Latest Range: 36.0-46.0 % 32.8 (L) 32.2 (L) 35.2 (L)     Laray Anger, DO 11/03/11 1115

## 2011-11-01 NOTE — ED Notes (Signed)
Patient transported to X-ray 

## 2011-11-01 NOTE — H&P (Signed)
Christine [name] MRN: [id number] DOB/AGE: [birth year] 76 y.o. Primary Care Physician:SHELTON,KIMBERLY R., MD, MD Admit date: 11/01/2011 Chief Complaint: Hematuria  HPI: 78 year old female comes in with complaint of persistent hematuria, dysuria that started this morning. The patient complains of generalized weakness following her discharge from [facility] on January 9. She presented with hematuria at that time, had a supratherapeutic INR. Her Coumadin was discontinued and she had an IVC filter placed. Since then the patient has had generalized weakness and poor oral intake. She was found to be hypotensive with systolic blood pressure in the 90s when she presented to the ER. Hemoglobin has been stable at around 11.4  Past Medical History  Diagnosis Date  . GERD (gastroesophageal reflux disease)   . Hypertension   . Diabetes mellitus     diet controlled  . Morbid obesity   . Arthritis   . Chronic kidney disease     nephrolithiasis left kidney,s/p open resection  . Status post arthroscopic surgery of right knee   . Diverticulosis of sigmoid colon   . Chronic back pain   . Pneumonia 07/2011    hx  . Pulmonary embolism 07/2011    hx  . Uterine cancer     endometrial high grade adenocarcinoma  . Uterus cancer, sarcoma     high grade invasive carcinosarcoma    Past Surgical History  Procedure Date  . Abdominal hysterectomy     total with b/l salpingo-oopherectomy  . Colonoscopy     negative  . Knee arthroscopy     right knee    Prior to Admission medications   Medication Sig Start Date End Date Taking? Authorizing Provider  amLODipine (NORVASC) 10 MG tablet Take 10 mg by mouth every morning.    Yes Historical Provider, MD  cefUROXime (CEFTIN) 500 MG tablet Take 500 mg by mouth 2 (two) times daily. Pt takes twice daily x 5 days. Finished on 10-29-11 10/24/11 11/03/11 Yes Mutaz A Elmahi, MD  ezetimibe (ZETIA) 10 MG tablet Take 10 mg by mouth daily.    Yes Historical Provider, MD    furosemide (LASIX) 40 MG tablet Take 1 tablet (40 mg total) by mouth 2 (two) times daily. 10/06/11 10/05/12 Yes Erick Blinks, MD  HYDROcodone-acetaminophen (NORCO) 10-325 MG per tablet Take 1 tablet by mouth every 6 (six) hours as needed. For pain.    Yes Historical Provider, MD  metoCLOPramide (REGLAN) 10 MG tablet Take 10 mg by mouth 4 (four) times daily as needed. For nausea. Patient states that she takes once a day as needed.   Yes Historical Provider, MD  metoprolol succinate (TOPROL-XL) 25 MG 24 hr tablet Take 25 mg by mouth daily.     Yes Historical Provider, MD  nystatin (MYCOSTATIN) powder Apply 1 g topically 4 (four) times daily. Rash   Yes Historical Provider, MD  zolpidem (AMBIEN) 5 MG tablet Take 5 mg by mouth at bedtime as needed. For sleep.   Yes Historical Provider, MD    Allergies: No Known Allergies  Family History  Problem Relation Age of Onset  . Breast cancer Sister     1st sister  . Colon cancer Sister     2nd sister  . Cervical cancer Sister     3rd sister    Family History   Problem  Relation  Age of Onset   .  Breast cancer  Sister       1st sister    .  Colon cancer  Sister  2nd sister    .  Cervical cancer  Sister       3rd sister    History   Substance Use Topics   .  Smoking status:  Never Smoker   .  Smokeless tobacco:  Not on file   .  Alcohol Use:  No     ROS: Complete 14 morning of systems was done as documented in PHYSICAL EXAM: Blood pressure 97/64, pulse 92, temperature 98.2 F (36.8 C), temperature source Oral, resp. rate 20, height 5\' 6"  (1.676 m), weight 148.8 kg (328 lb 0.7 oz), SpO2 97.00%. Physical examination: Nursing notes reviewed; Vital signs and O2 SAT reviewed; Constitutional: Well developed, Well nourished, In no acute distress; Head: Normocephalic, atraumatic; Eyes: EOMI, PERRL, No scleral icterus; ENMT: Mouth and pharynx normal, Mucous membranes dry; Neck: Supple, Full range of motion, No lymphadenopathy;  Cardiovascular: Regular rate and rhythm, No murmur or gallop; Respiratory: Breath sounds clear & equal bilaterally, No rales, rhonchi, wheezes, Speaking full sentences with ease., Normal respiratory effort/excursion; Chest: Nontender, Movement normal; Abdomen: Soft, Nontender, Nondistended, Normal bowel sounds; Extremities: Pulses normal, No tenderness, +bilat LE's lymphedema, No calf asymmetry.; Neuro: AA&Ox3, Major CN grossly intact. Speech clear, no facial droop. No gross focal motor deficits in extremities.; Skin: Color normal, Warm, Dry, no rash.      No results found for this or any previous visit (from the past 240 hour(s)).   Results for orders placed during the hospital encounter of 11/01/11 (from the past 48 hour(s))  URINALYSIS, ROUTINE W REFLEX MICROSCOPIC     Status: Abnormal   Collection Time   11/01/11  9:20 AM      Component Value Range Comment   Color, Urine YELLOW  YELLOW     APPearance CLOUDY (*) CLEAR     Specific Gravity, Urine 1.014  1.005 - 1.030     pH 5.5  5.0 - 8.0     Glucose, UA NEGATIVE  NEGATIVE (mg/dL)    Hgb urine dipstick LARGE (*) NEGATIVE     Bilirubin Urine NEGATIVE  NEGATIVE     Ketones, ur NEGATIVE  NEGATIVE (mg/dL)    Protein, ur NEGATIVE  NEGATIVE (mg/dL)    Urobilinogen, UA 0.2  0.0 - 1.0 (mg/dL)    Nitrite NEGATIVE  NEGATIVE     Leukocytes, UA TRACE (*) NEGATIVE    URINE MICROSCOPIC-ADD ON     Status: Abnormal   Collection Time   11/01/11  9:20 AM      Component Value Range Comment   Squamous Epithelial / LPF FEW (*) RARE     WBC, UA 7-10  <3 (WBC/hpf)    RBC / HPF 11-20  <3 (RBC/hpf)    Bacteria, UA FEW (*) RARE     Urine-Other MUCOUS PRESENT     APTT     Status: Normal   Collection Time   11/01/11 10:20 AM      Component Value Range Comment   aPTT 36  24 - 37 (seconds)   PROTIME-INR     Status: Abnormal   Collection Time   11/01/11 10:20 AM      Component Value Range Comment   Prothrombin Time 15.8 (*) 11.6 - 15.2 (seconds)    INR  1.23  0.00 - 1.49    LACTIC ACID, PLASMA     Status: Normal   Collection Time   11/01/11 10:20 AM      Component Value Range Comment   Lactic Acid, Venous 1.7  0.5 -  2.2 (mmol/L)   LIPASE, BLOOD     Status: Normal   Collection Time   11/01/11 10:20 AM      Component Value Range Comment   Lipase 16  11 - 59 (U/L)   COMPREHENSIVE METABOLIC PANEL     Status: Abnormal   Collection Time   11/01/11 10:20 AM      Component Value Range Comment   Sodium 133 (*) 135 - 145 (mEq/L)    Potassium 4.8  3.5 - 5.1 (mEq/L)    Chloride 94 (*) 96 - 112 (mEq/L)    CO2 27  19 - 32 (mEq/L)    Glucose, Bld 103 (*) 70 - 99 (mg/dL)    BUN 40 (*) 6 - 23 (mg/dL)    Creatinine, Ser 4.09 (*) 0.50 - 1.10 (mg/dL)    Calcium 9.6  8.4 - 10.5 (mg/dL)    Total Protein 7.6  6.0 - 8.3 (g/dL)    Albumin 2.9 (*) 3.5 - 5.2 (g/dL)    AST 15  0 - 37 (U/L)    ALT 8  0 - 35 (U/L)    Alkaline Phosphatase 68  39 - 117 (U/L)    Total Bilirubin 0.3  0.3 - 1.2 (mg/dL)    GFR calc non Af Amer 19 (*) >90 (mL/min)    GFR calc Af Amer 22 (*) >90 (mL/min)   CBC     Status: Abnormal   Collection Time   11/01/11 10:20 AM      Component Value Range Comment   WBC 10.8 (*) 4.0 - 10.5 (K/uL)    RBC 3.81 (*) 3.87 - 5.11 (MIL/uL)    Hemoglobin 11.4 (*) 12.0 - 15.0 (g/dL)    HCT 81.1 (*) 91.4 - 46.0 (%)    MCV 92.4  78.0 - 100.0 (fL)    MCH 29.9  26.0 - 34.0 (pg)    MCHC 32.4  30.0 - 36.0 (g/dL)    RDW 78.2  95.6 - 21.3 (%)    Platelets 472 (*) 150 - 400 (K/uL)   DIFFERENTIAL     Status: Abnormal   Collection Time   11/01/11 10:20 AM      Component Value Range Comment   Neutrophils Relative 71  43 - 77 (%)    Neutro Abs 7.7  1.7 - 7.7 (K/uL)    Lymphocytes Relative 13  12 - 46 (%)    Lymphs Abs 1.4  0.7 - 4.0 (K/uL)    Monocytes Relative 12  3 - 12 (%)    Monocytes Absolute 1.3 (*) 0.1 - 1.0 (K/uL)    Eosinophils Relative 4  0 - 5 (%)    Eosinophils Absolute 0.4  0.0 - 0.7 (K/uL)    Basophils Relative 0  0 - 1 (%)    Basophils  Absolute 0.0  0.0 - 0.1 (K/uL)   CARDIAC PANEL(CRET KIN+CKTOT+MB+TROPI)     Status: Normal   Collection Time   11/01/11 10:20 AM      Component Value Range Comment   Total CK 167  7 - 177 (U/L)    CK, MB 1.9  0.3 - 4.0 (ng/mL)    Troponin I <0.30  <0.30 (ng/mL)    Relative Index 1.1  0.0 - 2.5    POCT I-STAT TROPONIN I     Status: Normal   Collection Time   11/01/11 10:33 AM      Component Value Range Comment   Troponin i, poc 0.02  0.00 - 0.08 (ng/mL)  Comment 3              Ct Abdomen Pelvis Wo Contrast  10/20/2011  *RADIOLOGY REPORT*  Clinical Data: Hematuria.  Back pain.  CT ABDOMEN AND PELVIS WITHOUT CONTRAST  Technique:  Multidetector CT imaging of the abdomen and pelvis was performed following the standard protocol without intravenous contrast.  Comparison: CT abdomen and pelvis with contrast 03/16/2011.  Findings: A right pleural effusion is present.  Scarring and atelectasis is present at the lung bases bilaterally.  There is some bronchiectasis.  A moderate sized hiatal hernia is again noted.  The heart is enlarged.  No significant pericardial effusion is present.  Tiny cysts previously seen in the liver are not evident on this study.  The liver and spleen are otherwise within normal limits. The remainder of the stomach is within normal limits.  The pancreas and duodenum are unremarkable.  The common bile duct and gallbladder are normal.  The adrenal glands are normal bilaterally. Surgical clips are present about the left kidney without change. There is some thinning of the cortex.  Exophytic cysts of the right kidney are stable.  There is a focal calcification along the inferior aspect of the right cyst which may or may not be in the cyst.  No other significant nephrolithiasis is present.  The ureters are within normal limits bilaterally.  A Foley catheter is present within the urinary bladder.  The rectosigmoid colon is within normal limits.  Minimal presacral soft tissue prominence is  stable.  The remainder of the colon is within normal limits.  Moderate stool is present.  The appendix is visualized and normal.  The small bowel is unremarkable.  No significant adenopathy or free fluid is present.  Atherosclerotic calcifications in the aorta are stable.  There is no aneurysm.  The ventral laparotomy scar is stable.  Bone windows demonstrate mild facet degenerative change.  No focal lytic or blastic lesions are evident.  There is some degenerative change in the hip joints bilaterally, worse on the left. Degenerative changes are evident in the SI joints as well.  A transitional L5 segment is present with partial sacralization on the right and pseudoarticulation with the sacral ala.  IMPRESSION:  1.  Stone versus cyst calcification in the right kidney is stable. 2.  No other nephrolithiasis or hydronephrosis. 3.  Stable right renal cysts. 4.  Stable postsurgical changes of the left kidney and renal atrophy. 5.  New right pleural effusion and associated airspace disease. 6.  Large hiatal hernia.  7.  Moderate stool throughout the colon. 8.  A Foley catheter is present within the urinary bladder. 9.  Degenerative changes in the axial skeleton as described.  Original Report Authenticated By: Jamesetta Orleans. MATTERN, M.D.   Ir Ivc Filter Plmt / S&i /img Guid/mod Sed  10/22/2011  *RADIOLOGY REPORT*  Clinical Data/Indication: PULMONARY THROMBOEMBOLISM ON COUMADIN.  IVC FILTER PLACEMENT, CO2 VENOGRAM  Sedation: Versed 2 mg, Fentanyl 100 mg.  Total Moderate Sedation Time: 20 minutes.  Fluoroscopy Time: 2.3 minutes.  Procedure: The procedure, risks, benefits, and alternatives were explained to the patient. Questions regarding the procedure were encouraged and answered. The patient understands and consents to the procedure.  The right neck was prepped with chlorhexidine in a sterile fashion, and a sterile drape was applied covering the operative field. A sterile gown and sterile gloves were used for the  procedure.  Under sonographic guidance, a micropuncture needle was inserted into the right internal jugular vein and removed  over a 0018 wire which was upsized to a [place].  Sonographic and fluoroscopic documentation was obtained.  A pigtail catheter was advanced into the IVC and carbon dioxide venography was performed.  The pigtail was exchanged for the long venous sheath.  The Celect IVC filter was then deployed with its tip at the L1-2 disc.  The sheath was removed and hemostasis was achieved with direct pressure.  Findings: CO2 venography demonstrates renal vein inflow without venous anomaly.  The final image demonstrates an IVC filter with its tip at the L1-2 disc.  Complications: None.  IMPRESSION: Successful IVC filter placement.  Original Report Authenticated By: Donavan Burnet, M.D.   Dg Abd Acute W/chest  11/01/2011  *RADIOLOGY REPORT*  Clinical Data: History of uterine cancer.  Vaginal bleeding  ACUTE ABDOMEN SERIES (ABDOMEN 2 VIEW & CHEST 1 VIEW)  Comparison: 10/20/2011  Findings: Heart size appears moderately enlarged. There is pulmonary venous congestion.  There is no pleural effusion or pulmonary edema.  Coarsened interstitial markings are noted bilaterally.  No airspace consolidation.  The patient has an IVC filter.  There is moderate stool within the colon up to the rectum.  No dilated loops of small bowel or fluid levels noted.  IMPRESSION:  1.  Cardiac enlargement and pulmonary venous congestion. 2.  Nonobstructive bowel gas pattern.  Original Report Authenticated By: Rosealee Albee, M.D.    Impression:     Hematuria:  Likely combination of supratherapeutic INR and probable cystitis given dysuria and urinalysis.  - Admit to medicine  - Will start her on broad-spectrum antibiotics namely vancomycin and Zosyn because of her low blood pressure, obtain blood cultures x2.- Attempt to obtain urine culture  Will start bladder irrigation of the patient's hematuria continues, a urology  consultation has been obtained  Now she is off Coumadin and has an IVC filter in place.    Acute on chronic renal failure:  Likely related to prerenal etiology given history of dizziness and lower blood pressures  - Gentle IV hydration given heart failure exacerbation  - Hold home diuretics  Creatinine is elevated from her baseline - Continue to monitor daily, renally dosing medications   Cough at home:  - Check BNP in the morning given concern  - Continue home cardiac medications, monitoring closely with dehydration   Hypertension:  Blood pressure currently on the lower limits of normal  - Hold on diuretics   Fluid/electrolytes/nutrition:  - Bolus fluids as needed  - Monitor electrolytes daily  - Cardiac diet       Rodd Heft 11/01/2011, 2:20 PM

## 2011-11-02 LAB — COMPREHENSIVE METABOLIC PANEL
Alkaline Phosphatase: 55 U/L (ref 39–117)
BUN: 36 mg/dL — ABNORMAL HIGH (ref 6–23)
CO2: 27 mEq/L (ref 19–32)
Chloride: 101 mEq/L (ref 96–112)
Creatinine, Ser: 2.08 mg/dL — ABNORMAL HIGH (ref 0.50–1.10)
GFR calc Af Amer: 25 mL/min — ABNORMAL LOW (ref >90)
GFR calc non Af Amer: 22 mL/min — ABNORMAL LOW (ref >90)
Glucose, Bld: 90 mg/dL (ref 70–99)
Potassium: 4.5 mEq/L (ref 3.5–5.1)
Total Bilirubin: 0.3 mg/dL (ref 0.3–1.2)

## 2011-11-02 LAB — URINE CULTURE: Culture: NO GROWTH

## 2011-11-02 LAB — CARDIAC PANEL(CRET KIN+CKTOT+MB+TROPI)
CK, MB: 1.8 ng/mL (ref 0.3–4.0)
Relative Index: 1.3 (ref 0.0–2.5)
Total CK: 138 U/L (ref 7–177)
Total CK: 142 U/L (ref 7–177)
Troponin I: <0.3 ng/mL (ref <0.30)

## 2011-11-02 LAB — TSH: TSH: 1.806 u[IU]/mL (ref 0.350–4.500)

## 2011-11-02 MED ORDER — DEXTROSE 5 % IV SOLN
1.0000 g | INTRAVENOUS | Status: DC
  Administered 2011-11-02 – 2011-11-05 (×4): 1 g via INTRAVENOUS
  Filled 2011-11-02 (×4): qty 10

## 2011-11-02 NOTE — Progress Notes (Signed)
UR complete 

## 2011-11-02 NOTE — Progress Notes (Signed)
Subjective: Hematuria is improved  Objective: Vital signs in last 24 hours: Filed Vitals:   11/01/11 2240 11/02/11 0601 11/02/11 0841 11/02/11 0953  BP:  93/46  105/66  Pulse:  82  80  Temp:  98.4 F (36.9 C)    TempSrc:  Oral    Resp:  16    Height:      Weight:      SpO2: 97% 95% 94%     Intake/Output Summary (Last 24 hours) at 11/02/11 1036 Last data filed at 11/02/11 0840  Gross per 24 hour  Intake 2187.5 ml  Output   2250 ml  Net  -62.5 ml    Weight change:   Cardiovascular: Skin warm; not flushed  Respiratory: Breaths quiet; no shortness of breath  Abdomen: No masses  Neurological: Normal sensation to touch  Musculoskeletal: Normal motor function arms and legs  Lymphatics: No inguinal adenopathy  Skin: No rashes  Genitourinary:Normal female genitalia. She has an indwelling Foley that is draining clear urine. CBI is running at this time. The urine was clear at the time of the catheter insertion.   Lab Results: Results for orders placed during the hospital encounter of 11/01/11 (from the past 24 hour(s))  URINALYSIS, ROUTINE W REFLEX MICROSCOPIC     Status: Abnormal   Collection Time   11/01/11  3:03 PM      Component Value Range   Color, Urine YELLOW  YELLOW    APPearance CLEAR  CLEAR    Specific Gravity, Urine 1.011  1.005 - 1.030    pH 6.5  5.0 - 8.0    Glucose, UA NEGATIVE  NEGATIVE (mg/dL)   Hgb urine dipstick LARGE (*) NEGATIVE    Bilirubin Urine NEGATIVE  NEGATIVE    Ketones, ur NEGATIVE  NEGATIVE (mg/dL)   Protein, ur NEGATIVE  NEGATIVE (mg/dL)   Urobilinogen, UA 0.2  0.0 - 1.0 (mg/dL)   Nitrite NEGATIVE  NEGATIVE    Leukocytes, UA TRACE (*) NEGATIVE   URINE MICROSCOPIC-ADD ON     Status: Abnormal   Collection Time   11/01/11  3:03 PM      Component Value Range   Squamous Epithelial / LPF FEW (*) RARE    WBC, UA 3-6  <3 (WBC/hpf)   RBC / HPF 3-6  <3 (RBC/hpf)   Bacteria, UA FEW (*) RARE    Urine-Other FEW YEAST    PRO B NATRIURETIC PEPTIDE      Status: Abnormal   Collection Time   11/01/11  3:20 PM      Component Value Range   Pro B Natriuretic peptide (BNP) 464.5 (*) 0 - 450 (pg/mL)  TSH     Status: Normal   Collection Time   11/01/11  4:00 PM      Component Value Range   TSH 1.806  0.350 - 4.500 (uIU/mL)  PROTIME-INR     Status: Abnormal   Collection Time   11/01/11  4:00 PM      Component Value Range   Prothrombin Time 15.7 (*) 11.6 - 15.2 (seconds)   INR 1.22  0.00 - 1.49   GLUCOSE, CAPILLARY     Status: Normal   Collection Time   11/01/11  5:28 PM      Component Value Range   Glucose-Capillary 80  70 - 99 (mg/dL)  CARDIAC PANEL(CRET KIN+CKTOT+MB+TROPI)     Status: Normal   Collection Time   11/01/11  6:00 PM      Component Value Range   Total  CK 167  7 - 177 (U/L)   CK, MB 2.1  0.3 - 4.0 (ng/mL)   Troponin I <0.30  <0.30 (ng/mL)   Relative Index 1.3  0.0 - 2.5   CARDIAC PANEL(CRET KIN+CKTOT+MB+TROPI)     Status: Normal   Collection Time   11/02/11 12:54 AM      Component Value Range   Total CK 138  7 - 177 (U/L)   CK, MB 1.8  0.3 - 4.0 (ng/mL)   Troponin I <0.30  <0.30 (ng/mL)   Relative Index 1.3  0.0 - 2.5   COMPREHENSIVE METABOLIC PANEL     Status: Abnormal   Collection Time   11/02/11  6:13 AM      Component Value Range   Sodium 136  135 - 145 (mEq/L)   Potassium 4.5  3.5 - 5.1 (mEq/L)   Chloride 101  96 - 112 (mEq/L)   CO2 27  19 - 32 (mEq/L)   Glucose, Bld 90  70 - 99 (mg/dL)   BUN 36 (*) 6 - 23 (mg/dL)   Creatinine, Ser 9.60 (*) 0.50 - 1.10 (mg/dL)   Calcium 9.0  8.4 - 45.4 (mg/dL)   Total Protein 6.5  6.0 - 8.3 (g/dL)   Albumin 2.5 (*) 3.5 - 5.2 (g/dL)   AST 13  0 - 37 (U/L)   ALT 7  0 - 35 (U/L)   Alkaline Phosphatase 55  39 - 117 (U/L)   Total Bilirubin 0.3  0.3 - 1.2 (mg/dL)   GFR calc non Af Amer 22 (*) >90 (mL/min)   GFR calc Af Amer 25 (*) >90 (mL/min)  CARDIAC PANEL(CRET KIN+CKTOT+MB+TROPI)     Status: Normal   Collection Time   11/02/11  6:13 AM      Component Value Range   Total CK  142  7 - 177 (U/L)   CK, MB 1.9  0.3 - 4.0 (ng/mL)   Troponin I <0.30  <0.30 (ng/mL)   Relative Index 1.3  0.0 - 2.5      Micro: Recent Results (from the past 240 hour(s))  CULTURE, BLOOD (ROUTINE X 2)     Status: Normal (Preliminary result)   Collection Time   11/01/11 10:20 AM      Component Value Range Status Comment   Specimen Description BLOOD LEFT ARM   Final    Special Requests BOTTLES DRAWN AEROBIC AND ANAEROBIC 5CC   Final    Setup Time [id number]   Final    Culture     Final    Value:        BLOOD CULTURE RECEIVED NO GROWTH TO DATE CULTURE WILL BE HELD FOR 5 DAYS BEFORE ISSUING A FINAL NEGATIVE REPORT   Report Status PENDING   Incomplete   CULTURE, BLOOD (ROUTINE X 2)     Status: Normal (Preliminary result)   Collection Time   11/01/11 10:28 AM      Component Value Range Status Comment   Specimen Description BLOOD LEFT ARM   Final    Special Requests BOTTLES DRAWN AEROBIC AND ANAEROBIC 5CC   Final    Setup Time [id number]   Final    Culture     Final    Value:        BLOOD CULTURE RECEIVED NO GROWTH TO DATE CULTURE WILL BE HELD FOR 5 DAYS BEFORE ISSUING A FINAL NEGATIVE REPORT   Report Status PENDING   Incomplete     Studies/Results: US Renal  11/01/2011  *RADIOLOGY REPORT*  Clinical Data: Renal insufficiency.  History of diabetes and hypertension.  RENAL/URINARY TRACT ULTRASOUND COMPLETE  Comparison:  CT dated 10/20/2011  Findings:  Right Kidney:  The right kidney measures approximately 14 cm when including a large upper pole renal cyst.  When not including the cyst, renal length is approximately 9 cm.  Upper pole cyst measures 6 cm and is simple in appearance. Additional 3 cm lower pole cyst has a benign appearance.  The kidney shows diffuse cortical thinning as well as increased cortical echogenicity consistent with chronic disease.  No evidence of hydronephrosis.  Left Kidney:  The left kidney measures approximately 10.9 cm.  This kidney also shows diffuse cortical  thinning and increased cortical echogenicity consistent with chronic disease.  No evidence of hydronephrosis or focal lesions.  Bladder:  Unremarkable.  IMPRESSION: Evidence of chronic kidney disease with significant cortical atrophy and increased cortical echogenicity bilaterally.  No evidence of renal obstruction.  Original Report Authenticated By: Reola Calkins, M.D.   Dg Abd Acute W/chest  11/01/2011  *RADIOLOGY REPORT*  Clinical Data: History of uterine cancer.  Vaginal bleeding  ACUTE ABDOMEN SERIES (ABDOMEN 2 VIEW & CHEST 1 VIEW)  Comparison: 10/20/2011  Findings: Heart size appears moderately enlarged. There is pulmonary venous congestion.  There is no pleural effusion or pulmonary edema.  Coarsened interstitial markings are noted bilaterally.  No airspace consolidation.  The patient has an IVC filter.  There is moderate stool within the colon up to the rectum.  No dilated loops of small bowel or fluid levels noted.  IMPRESSION:  1.  Cardiac enlargement and pulmonary venous congestion. 2.  Nonobstructive bowel gas pattern.  Original Report Authenticated By: Rosealee Albee, M.D.    Medications:  Scheduled Meds:   . ezetimibe  10 mg Oral Daily  . levalbuterol  0.63 mg Nebulization Q6H  . metoprolol succinate  25 mg Oral Daily  . piperacillin-tazobactam (ZOSYN)  IV  3.375 g Intravenous Q8H  . vancomycin  2,000 mg Intravenous Q48H  . DISCONTD: dextrose 5 % 100 mL with piperacillin-tazobactam (ZOSYN) 4.5 g infusion   Intravenous Once  . DISCONTD: piperacillin-tazobactam (ZOSYN)  IV  4.5 g Intravenous Once  . DISCONTD: vancomycin  1,000 mg Intravenous Once   Continuous Infusions:   . sodium chloride 75 mL/hr at 11/01/11 1708  . sodium chloride irrigation 3,000 mL (11/01/11 1910)  . DISCONTD: sodium chloride 75 mL/hr at 11/01/11 1126  . DISCONTD: sodium chloride     PRN Meds:.acetaminophen, acetaminophen, HYDROcodone-acetaminophen, ondansetron (ZOFRAN) IV, ondansetron, pneumococcal 23  valent vaccine, zolpidem   Assessment:   Hematuria:   - Started on broad-spectrum antibiotics and because of low blood pressure, currently hemodynamically stable pulse obtain blood cultures x2.- Attempt to obtain urine culture . Continue Rocephin  , a urology consultation has been obtained  Will need outpatient cystoscopy. Discontinue CBI  Acute on chronic renal failure:  Likely related to prerenal etiology given history of dizziness and lower blood pressures  - Gentle IV hydration given heart failure exacerbation  - Hold home diuretics Creatinine is elevated from her baseline  - Continue to monitor daily, renally dosing medications    n  Hypertension:  Blood pressure currently on the lower limits of normal  - Hold on diuretics   Fluid/electrolytes/nutrition:  - Bolus fluids as needed  - Monitor electrolytes daily  - Cardiac diet   Disposition anticipate discharge tomorrow  LOS: 1 day   [facility] 11/02/2011, 10:36 AM

## 2011-11-02 NOTE — Progress Notes (Signed)
T: 98.4   BP: 105/66  P:  80    R: 16  Fells fine today.   No abdominal pain.   Foley draining clear urine Urine culture: pending. Suggest: D/C CBI.  Replace 24 Fr Foley with a #16 Fr.                Cystoscopy can be done as outpatient.                Please call with any questions.

## 2011-11-02 NOTE — Progress Notes (Signed)
CSW spoke with patient re: discharge planning. Patient requesting to go to [facility], that she had been there in the past. CSW completed FL2, faxed to facility & confirmed with [facility] that they will have a bed available for her when ready. Anticipating discharge Monday.   Unice Bailey, LCSWA [phone]

## 2011-11-03 LAB — BASIC METABOLIC PANEL
BUN: 33 mg/dL — ABNORMAL HIGH (ref 6–23)
Chloride: 101 mEq/L (ref 96–112)
GFR calc Af Amer: 31 mL/min — ABNORMAL LOW (ref >90)
Glucose, Bld: 87 mg/dL (ref 70–99)
Potassium: 5.1 mEq/L (ref 3.5–5.1)

## 2011-11-03 LAB — CBC
HCT: 31.9 % — ABNORMAL LOW (ref 36.0–46.0)
Hemoglobin: 10 g/dL — ABNORMAL LOW (ref 12.0–15.0)
MCHC: 31.3 g/dL (ref 30.0–36.0)
RBC: 3.4 MIL/uL — ABNORMAL LOW (ref 3.87–5.11)

## 2011-11-03 MED ORDER — SIMETHICONE 80 MG PO CHEW
80.0000 mg | CHEWABLE_TABLET | Freq: Four times a day (QID) | ORAL | Status: DC | PRN
  Administered 2011-11-03 – 2011-11-04 (×2): 80 mg via ORAL
  Filled 2011-11-03 (×2): qty 1

## 2011-11-03 MED ORDER — ALBUTEROL SULFATE (5 MG/ML) 0.5% IN NEBU: 2.5000 mg | INHALATION_SOLUTION | Freq: Four times a day (QID) | RESPIRATORY_TRACT | Status: DC | PRN

## 2011-11-03 NOTE — Progress Notes (Addendum)
Subjective: Hematuria has resolved full  Objective: Vital signs in last 24 hours: Filed Vitals:   11/02/11 2104 11/02/11 2119 11/03/11 0500 11/03/11 0548  BP:  103/62  113/73  Pulse:  83  80  Temp:  98.9 F (37.2 C)  98.4 F (36.9 C)  TempSrc:  Oral  Oral  Resp:  16  18  Height:      Weight:   153.1 kg (337 lb 8.4 oz)   SpO2: 98% 98%  96%    Intake/Output Summary (Last 24 hours) at 11/03/11 1102 Last data filed at 11/03/11 4132  Gross per 24 hour  Intake   3015 ml  Output   1050 ml  Net   1965 ml    Weight change: 4.3 kg (9 lb 7.7 oz)   Cardiovascular: Skin warm; not flushed  Respiratory: Breaths quiet; no shortness of breath  Abdomen: No masses  Neurological: Normal sensation to touch  Musculoskeletal: Normal motor function arms and legs  Lymphatics: No inguinal adenopathy  Skin: No rashes  Genitourinary:Normal female genitalia. She has an indwelling Foley that is draining clear urine. CBI is running at this time. The urine was clear at the time of the catheter insertion.  Lab Results: Results for orders placed during the hospital encounter of 11/01/11 (from the past 24 hour(s))  BASIC METABOLIC PANEL     Status: Abnormal   Collection Time   11/03/11  5:59 AM      Component Value Range   Sodium 137  135 - 145 (mEq/L)   Potassium 5.1  3.5 - 5.1 (mEq/L)   Chloride 101  96 - 112 (mEq/L)   CO2 27  19 - 32 (mEq/L)   Glucose, Bld 87  70 - 99 (mg/dL)   BUN 33 (*) 6 - 23 (mg/dL)   Creatinine, Ser 4.40 (*) 0.50 - 1.10 (mg/dL)   Calcium 8.9  8.4 - 10.2 (mg/dL)   GFR calc non Af Amer 27 (*) >90 (mL/min)   GFR calc Af Amer 31 (*) >90 (mL/min)  CBC     Status: Abnormal   Collection Time   11/03/11  5:59 AM      Component Value Range   WBC 10.1  4.0 - 10.5 (K/uL)   RBC 3.40 (*) 3.87 - 5.11 (MIL/uL)   Hemoglobin 10.0 (*) 12.0 - 15.0 (g/dL)   HCT 72.5 (*) 36.6 - 46.0 (%)   MCV 93.8  78.0 - 100.0 (fL)   MCH 29.4  26.0 - 34.0 (pg)   MCHC 31.3  30.0 - 36.0 (g/dL)   RDW  44.0  34.7 - 42.5 (%)   Platelets 459 (*) 150 - 400 (K/uL)     Micro: Recent Results (from the past 240 hour(s))  URINE CULTURE     Status: Normal   Collection Time   11/01/11  9:20 AM      Component Value Range Status Comment   Specimen Description URINE, CATHETERIZED   Final    Special Requests NONE   Final    Setup Time [id number]   Final    Colony Count NO GROWTH   Final    Culture NO GROWTH   Final    Report Status 11/02/2011 FINAL   Final   CULTURE, BLOOD (ROUTINE X 2)     Status: Normal (Preliminary result)   Collection Time   11/01/11 10:20 AM      Component Value Range Status Comment   Specimen Description BLOOD LEFT ARM   Final  Special Requests BOTTLES DRAWN AEROBIC AND ANAEROBIC 5CC   Final    Setup Time [id number]   Final    Culture     Final    Value:        BLOOD CULTURE RECEIVED NO GROWTH TO DATE CULTURE WILL BE HELD FOR 5 DAYS BEFORE ISSUING A FINAL NEGATIVE REPORT   Report Status PENDING   Incomplete   CULTURE, BLOOD (ROUTINE X 2)     Status: Normal (Preliminary result)   Collection Time   11/01/11 10:28 AM      Component Value Range Status Comment   Specimen Description BLOOD LEFT ARM   Final    Special Requests BOTTLES DRAWN AEROBIC AND ANAEROBIC 5CC   Final    Setup Time [id number]   Final    Culture     Final    Value:        BLOOD CULTURE RECEIVED NO GROWTH TO DATE CULTURE WILL BE HELD FOR 5 DAYS BEFORE ISSUING A FINAL NEGATIVE REPORT   Report Status PENDING   Incomplete   CULTURE, BLOOD (ROUTINE X 2)     Status: Normal (Preliminary result)   Collection Time   11/01/11  3:00 PM      Component Value Range Status Comment   Specimen Description BLOOD RIGHT HAND   Final    Special Requests BOTTLES DRAWN AEROBIC AND ANAEROBIC 2CC   Final    Setup Time [id number]   Final    Culture     Final    Value:        BLOOD CULTURE RECEIVED NO GROWTH TO DATE CULTURE WILL BE HELD FOR 5 DAYS BEFORE ISSUING A FINAL NEGATIVE REPORT   Report Status PENDING    Incomplete   CULTURE, BLOOD (ROUTINE X 2)     Status: Normal (Preliminary result)   Collection Time   11/01/11  3:20 PM      Component Value Range Status Comment   Specimen Description BLOOD LEFT ARM   Final    Special Requests BOTTLES DRAWN AEROBIC AND ANAEROBIC 4CC   Final    Setup Time [id number]   Final    Culture     Final    Value:        BLOOD CULTURE RECEIVED NO GROWTH TO DATE CULTURE WILL BE HELD FOR 5 DAYS BEFORE ISSUING A FINAL NEGATIVE REPORT   Report Status PENDING   Incomplete     Studies/Results: US Renal  11/01/2011  *RADIOLOGY REPORT*  Clinical Data: Renal insufficiency.  History of diabetes and hypertension.  RENAL/URINARY TRACT ULTRASOUND COMPLETE  Comparison:  CT dated 10/20/2011  Findings:  Right Kidney:  The right kidney measures approximately 14 cm when including a large upper pole renal cyst.  When not including the cyst, renal length is approximately 9 cm.  Upper pole cyst measures 6 cm and is simple in appearance. Additional 3 cm lower pole cyst has a benign appearance.  The kidney shows diffuse cortical thinning as well as increased cortical echogenicity consistent with chronic disease.  No evidence of hydronephrosis.  Left Kidney:  The left kidney measures approximately 10.9 cm.  This kidney also shows diffuse cortical thinning and increased cortical echogenicity consistent with chronic disease.  No evidence of hydronephrosis or focal lesions.  Bladder:  Unremarkable.  IMPRESSION: Evidence of chronic kidney disease with significant cortical atrophy and increased cortical echogenicity bilaterally.  No evidence of renal obstruction.  Original Report Authenticated By: Reola Calkins, M.D.  Medications:  Scheduled Meds:   . cefTRIAXone (ROCEPHIN)  IV  1 g Intravenous Q24H  . ezetimibe  10 mg Oral Daily  . metoprolol succinate  25 mg Oral Daily  . DISCONTD: levalbuterol  0.63 mg Nebulization Q6H   Continuous Infusions:   . sodium chloride 100 mL/hr at 11/03/11  0700  . sodium chloride irrigation 3,000 mL (11/01/11 1910)   PRN Meds:.acetaminophen, acetaminophen, albuterol, HYDROcodone-acetaminophen, ondansetron (ZOFRAN) IV, ondansetron, pneumococcal 23 valent vaccine, zolpidem   Assessment:   Hematuria:  - Blood pressure stable, cultures negative, discontinue broad-spectrum antibiotics, hematuria resolved. Follow CBC,   Continue Rocephin  , a urology consultation has been obtained , outpatient cystoscopy    Acute on chronic renal failure:   Likely related to prerenal etiology given history of dizziness and lower blood pressures  - Gentle IV hydration given heart failure exacerbation  - Hold home diuretics Creatinine is elevated from her baseline  Improving     Hypertension:  Blood pressure currently on the lower limits of normal  - Hold on diuretics    Fluid/electrolytes/nutrition:  - Bolus fluids as needed , potassium is trending up - Monitor electrolytes daily  - Cardiac diet     Disposition likely discharge to SNF next week  LOS: 2 days   [facility] 11/03/2011, 11:02 AM

## 2011-11-04 LAB — BASIC METABOLIC PANEL
BUN: 28 mg/dL — ABNORMAL HIGH (ref 6–23)
CO2: 27 mEq/L (ref 19–32)
GFR calc non Af Amer: 31 mL/min — ABNORMAL LOW (ref >90)
Glucose, Bld: 97 mg/dL (ref 70–99)
Potassium: 4.5 mEq/L (ref 3.5–5.1)

## 2011-11-04 MED ORDER — CEFUROXIME AXETIL 500 MG PO TABS: 500.0000 mg | ORAL_TABLET | Freq: Two times a day (BID) | ORAL | Status: AC

## 2011-11-04 NOTE — Progress Notes (Signed)
Subjective: Had some hematuria last night  Objective: Vital signs in last 24 hours: Filed Vitals:   11/03/11 1331 11/03/11 2109 11/04/11 0400 11/04/11 0500  BP: 109/69 107/61  115/69  Pulse: 79 84  86  Temp: 97.9 F (36.6 C) 97.8 F (36.6 C)  98.4 F (36.9 C)  TempSrc: Oral Oral  Oral  Resp: 16 18  20   Height:      Weight:   153.9 kg (339 lb 4.6 oz)   SpO2: 97% 98%  97%    Intake/Output Summary (Last 24 hours) at 11/04/11 1249 Last data filed at 11/04/11 1003  Gross per 24 hour  Intake   3585 ml  Output    650 ml  Net   2935 ml    Weight change: 0.8 kg (1 lb 12.2 oz)  Cardiovascular: Skin warm; not flushed  Respiratory: Breaths quiet; no shortness of breath  Abdomen: No masses  Neurological: Normal sensation to touch  Musculoskeletal: Normal motor function arms and legs  Lymphatics: No inguinal adenopathy  Skin: No rashes  Genitourinary:Normal female genitalia. She has an indwelling Foley that is draining clear urine. CBI is running at this time. The urine was clear at the time of the catheter insertion.      Lab Results: Results for orders placed during the hospital encounter of 11/01/11 (from the past 24 hour(s))  BASIC METABOLIC PANEL     Status: Abnormal   Collection Time   11/04/11  5:04 AM      Component Value Range   Sodium 138  135 - 145 (mEq/L)   Potassium 4.5  3.5 - 5.1 (mEq/L)   Chloride 102  96 - 112 (mEq/L)   CO2 27  19 - 32 (mEq/L)   Glucose, Bld 97  70 - 99 (mg/dL)   BUN 28 (*) 6 - 23 (mg/dL)   Creatinine, Ser 1.61 (*) 0.50 - 1.10 (mg/dL)   Calcium 9.0  8.4 - 09.6 (mg/dL)   GFR calc non Af Amer 31 (*) >90 (mL/min)   GFR calc Af Amer 36 (*) >90 (mL/min)     Micro: Recent Results (from the past 240 hour(s))  URINE CULTURE     Status: Normal   Collection Time   11/01/11  9:20 AM      Component Value Range Status Comment   Specimen Description URINE, CATHETERIZED   Final    Special Requests NONE   Final    Setup Time [id number]   Final      Colony Count NO GROWTH   Final    Culture NO GROWTH   Final    Report Status 11/02/2011 FINAL   Final   CULTURE, BLOOD (ROUTINE X 2)     Status: Normal (Preliminary result)   Collection Time   11/01/11 10:20 AM      Component Value Range Status Comment   Specimen Description BLOOD LEFT ARM   Final    Special Requests BOTTLES DRAWN AEROBIC AND ANAEROBIC 5CC   Final    Setup Time [id number]   Final    Culture     Final    Value:        BLOOD CULTURE RECEIVED NO GROWTH TO DATE CULTURE WILL BE HELD FOR 5 DAYS BEFORE ISSUING A FINAL NEGATIVE REPORT   Report Status PENDING   Incomplete   CULTURE, BLOOD (ROUTINE X 2)     Status: Normal (Preliminary result)   Collection Time   11/01/11 10:28 AM  Component Value Range Status Comment   Specimen Description BLOOD LEFT ARM   Final    Special Requests BOTTLES DRAWN AEROBIC AND ANAEROBIC 5CC   Final    Setup Time [id number]   Final    Culture     Final    Value:        BLOOD CULTURE RECEIVED NO GROWTH TO DATE CULTURE WILL BE HELD FOR 5 DAYS BEFORE ISSUING A FINAL NEGATIVE REPORT   Report Status PENDING   Incomplete   CULTURE, BLOOD (ROUTINE X 2)     Status: Normal (Preliminary result)   Collection Time   11/01/11  3:00 PM      Component Value Range Status Comment   Specimen Description BLOOD RIGHT HAND   Final    Special Requests BOTTLES DRAWN AEROBIC AND ANAEROBIC 2CC   Final    Setup Time [id number]   Final    Culture     Final    Value:        BLOOD CULTURE RECEIVED NO GROWTH TO DATE CULTURE WILL BE HELD FOR 5 DAYS BEFORE ISSUING A FINAL NEGATIVE REPORT   Report Status PENDING   Incomplete   CULTURE, BLOOD (ROUTINE X 2)     Status: Normal (Preliminary result)   Collection Time   11/01/11  3:20 PM      Component Value Range Status Comment   Specimen Description BLOOD LEFT ARM   Final    Special Requests BOTTLES DRAWN AEROBIC AND ANAEROBIC 4CC   Final    Setup Time [id number]   Final    Culture     Final    Value:        BLOOD  CULTURE RECEIVED NO GROWTH TO DATE CULTURE WILL BE HELD FOR 5 DAYS BEFORE ISSUING A FINAL NEGATIVE REPORT   Report Status PENDING   Incomplete   URINE CULTURE     Status: Normal   Collection Time   11/02/11 11:18 AM      Component Value Range Status Comment   Specimen Description URINE, CATHETERIZED   Final    Special Requests NONE   Final    Setup Time [id number]   Final    Colony Count NO GROWTH   Final    Culture NO GROWTH   Final    Report Status 11/03/2011 FINAL   Final     Studies/Results: No results found.  Medications:  Scheduled Meds:   . cefTRIAXone (ROCEPHIN)  IV  1 g Intravenous Q24H  . ezetimibe  10 mg Oral Daily  . metoprolol succinate  25 mg Oral Daily   Continuous Infusions:   . sodium chloride 100 mL/hr at 11/04/11 1003  . sodium chloride irrigation 3,000 mL (11/01/11 1910)   PRN Meds:.acetaminophen, acetaminophen, albuterol, HYDROcodone-acetaminophen, ondansetron (ZOFRAN) IV, ondansetron, pneumococcal 23 valent vaccine, simethicone, zolpidem   Assessment:   Hematuria:  - Blood pressure stable, cultures negative, discontinue broad-spectrum antibiotics, hematuria resolved. Follow CBC, Continue Rocephin  , a urology consultation has been obtained , outpatient cystoscopy   Acute on chronic renal failure:  Likely related to prerenal etiology given history of dizziness and lower blood pressures  - Gentle IV hydration given heart failure exacerbation  - Hold home diuretics Creatinine is elevated from her baseline    Hypertension:  Blood pressure currently on the lower limits of normal  - Hold on diuretics   Fluid/electrolytes/nutrition:  - Bolus fluids as needed , potassium is trending up  - Monitor electrolytes daily  -  Cardiac diet     disposition to skilled nursing facility tomorrow morning when bed available     LOS: 3 days   [facility] 11/04/2011, 12:49 PM

## 2011-11-04 NOTE — Discharge Summary (Addendum)
Physician Discharge Summary  Christine [name] MRN: [id number] DOB/AGE: 1930/10/28 78 y.o.  PCP: Alva Garnet., MD, MD   Admit date: 11/01/2011 Discharge date: 11/04/2011  Discharge Diagnoses:     *Hematuria Active Problems:  Uterine cancer  Acute diastolic congestive heart failure, 2D Oct 2012  Diabetes mellitus, diet controlled  AKI (acute kidney injury)   Current Discharge Medication List    CONTINUE these medications which have CHANGED   Details  cefUROXime (CEFTIN) 500 MG tablet Take 1 tablet (500 mg total) by mouth 2 (two) times daily. Qty: 10 tablet, Refills: 0      CONTINUE these medications which have NOT CHANGED   Details  amLODipine (NORVASC) 10 MG tablet Take 10 mg by mouth every morning.     ezetimibe (ZETIA) 10 MG tablet Take 10 mg by mouth daily.     furosemide (LASIX) 40 MG tablet Take 1 tablet (40 mg total) by mouth 2 (two) times daily. Qty: 60 tablet, Refills: 1    HYDROcodone-acetaminophen (NORCO) 10-325 MG per tablet Take 1 tablet by mouth every 6 (six) hours as needed. For pain.     metoCLOPramide (REGLAN) 10 MG tablet Take 10 mg by mouth 4 (four) times daily as needed. For nausea. Patient states that she takes once a day as needed.    metoprolol succinate (TOPROL-XL) 25 MG 24 hr tablet Take 25 mg by mouth daily.      nystatin (MYCOSTATIN) powder Apply 1 g topically 4 (four) times daily. Rash    zolpidem (AMBIEN) 5 MG tablet Take 5 mg by mouth at bedtime as needed. For sleep.        Discharge Condition: Stable Disposition: Home-Health Care Svc   Consults: Lindaann Slough, MD   Significant Diagnostic Studies: Ct Abdomen Pelvis Wo Contrast  10/20/2011  *RADIOLOGY REPORT*  Clinical Data: Hematuria.  Back pain.  CT ABDOMEN AND PELVIS WITHOUT CONTRAST  Technique:  Multidetector CT imaging of the abdomen and pelvis was performed following the standard protocol without intravenous contrast.  Comparison: CT abdomen and pelvis with  contrast 03/16/2011.  Findings: A right pleural effusion is present.  Scarring and atelectasis is present at the lung bases bilaterally.  There is some bronchiectasis.  A moderate sized hiatal hernia is again noted.  The heart is enlarged.  No significant pericardial effusion is present.  Tiny cysts previously seen in the liver are not evident on this study.  The liver and spleen are otherwise within normal limits. The remainder of the stomach is within normal limits.  The pancreas and duodenum are unremarkable.  The common bile duct and gallbladder are normal.  The adrenal glands are normal bilaterally. Surgical clips are present about the left kidney without change. There is some thinning of the cortex.  Exophytic cysts of the right kidney are stable.  There is a focal calcification along the inferior aspect of the right cyst which may or may not be in the cyst.  No other significant nephrolithiasis is present.  The ureters are within normal limits bilaterally.  A Foley catheter is present within the urinary bladder.  The rectosigmoid colon is within normal limits.  Minimal presacral soft tissue prominence is stable.  The remainder of the colon is within normal limits.  Moderate stool is present.  The appendix is visualized and normal.  The small bowel is unremarkable.  No significant adenopathy or free fluid is present.  Atherosclerotic calcifications in the aorta are stable.  There is no aneurysm.  The  ventral laparotomy scar is stable.  Bone windows demonstrate mild facet degenerative change.  No focal lytic or blastic lesions are evident.  There is some degenerative change in the hip joints bilaterally, worse on the left. Degenerative changes are evident in the SI joints as well.  A transitional L5 segment is present with partial sacralization on the right and pseudoarticulation with the sacral ala.  IMPRESSION:  1.  Stone versus cyst calcification in the right kidney is stable. 2.  No other nephrolithiasis or  hydronephrosis. 3.  Stable right renal cysts. 4.  Stable postsurgical changes of the left kidney and renal atrophy. 5.  New right pleural effusion and associated airspace disease. 6.  Large hiatal hernia.  7.  Moderate stool throughout the colon. 8.  A Foley catheter is present within the urinary bladder. 9.  Degenerative changes in the axial skeleton as described.  Original Report Authenticated By: Jamesetta Orleans. MATTERN, M.D.   Ir Ivc Filter Plmt / S&i /img Guid/mod Sed  10/22/2011  *RADIOLOGY REPORT*  Clinical Data/Indication: PULMONARY THROMBOEMBOLISM ON COUMADIN.  IVC FILTER PLACEMENT, CO2 VENOGRAM  Sedation: Versed 2 mg, Fentanyl 100 mg.  Total Moderate Sedation Time: 20 minutes.  Fluoroscopy Time: 2.3 minutes.  Procedure: The procedure, risks, benefits, and alternatives were explained to the patient. Questions regarding the procedure were encouraged and answered. The patient understands and consents to the procedure.  The right neck was prepped with chlorhexidine in a sterile fashion, and a sterile drape was applied covering the operative field. A sterile gown and sterile gloves were used for the procedure.  Under sonographic guidance, a micropuncture needle was inserted into the right internal jugular vein and removed over a 0018 wire which was upsized to a [employer].  Sonographic and fluoroscopic documentation was obtained.  A pigtail catheter was advanced into the IVC and carbon dioxide venography was performed.  The pigtail was exchanged for the long venous sheath.  The Celect IVC filter was then deployed with its tip at the L1-2 disc.  The sheath was removed and hemostasis was achieved with direct pressure.  Findings: CO2 venography demonstrates renal vein inflow without venous anomaly.  The final image demonstrates an IVC filter with its tip at the L1-2 disc.  Complications: None.  IMPRESSION: Successful IVC filter placement.  Original Report Authenticated By: Donavan Burnet, M.D.   US  Renal  11/01/2011  *RADIOLOGY REPORT*  Clinical Data: Renal insufficiency.  History of diabetes and hypertension.  RENAL/URINARY TRACT ULTRASOUND COMPLETE  Comparison:  CT dated 10/20/2011  Findings:  Right Kidney:  The right kidney measures approximately 14 cm when including a large upper pole renal cyst.  When not including the cyst, renal length is approximately 9 cm.  Upper pole cyst measures 6 cm and is simple in appearance. Additional 3 cm lower pole cyst has a benign appearance.  The kidney shows diffuse cortical thinning as well as increased cortical echogenicity consistent with chronic disease.  No evidence of hydronephrosis.  Left Kidney:  The left kidney measures approximately 10.9 cm.  This kidney also shows diffuse cortical thinning and increased cortical echogenicity consistent with chronic disease.  No evidence of hydronephrosis or focal lesions.  Bladder:  Unremarkable.  IMPRESSION: Evidence of chronic kidney disease with significant cortical atrophy and increased cortical echogenicity bilaterally.  No evidence of renal obstruction.  Original Report Authenticated By: Reola Calkins, M.D.   Dg Abd Acute W/chest  11/01/2011  *RADIOLOGY REPORT*  Clinical Data: History of uterine cancer.  Vaginal  bleeding  ACUTE ABDOMEN SERIES (ABDOMEN 2 VIEW & CHEST 1 VIEW)  Comparison: 10/20/2011  Findings: Heart size appears moderately enlarged. There is pulmonary venous congestion.  There is no pleural effusion or pulmonary edema.  Coarsened interstitial markings are noted bilaterally.  No airspace consolidation.  The patient has an IVC filter.  There is moderate stool within the colon up to the rectum.  No dilated loops of small bowel or fluid levels noted.  IMPRESSION:  1.  Cardiac enlargement and pulmonary venous congestion. 2.  Nonobstructive bowel gas pattern.  Original Report Authenticated By: Rosealee Albee, M.D.      Microbiology: Recent Results (from the past 240 hour(s))  URINE CULTURE      Status: Normal   Collection Time   11/01/11  9:20 AM      Component Value Range Status Comment   Specimen Description URINE, CATHETERIZED   Final    Special Requests NONE   Final    Setup Time [id number]   Final    Colony Count NO GROWTH   Final    Culture NO GROWTH   Final    Report Status 11/02/2011 FINAL   Final   CULTURE, BLOOD (ROUTINE X 2)     Status: Normal (Preliminary result)   Collection Time   11/01/11 10:20 AM      Component Value Range Status Comment   Specimen Description BLOOD LEFT ARM   Final    Special Requests BOTTLES DRAWN AEROBIC AND ANAEROBIC 5CC   Final    Setup Time [id number]   Final    Culture     Final    Value:        BLOOD CULTURE RECEIVED NO GROWTH TO DATE CULTURE WILL BE HELD FOR 5 DAYS BEFORE ISSUING A FINAL NEGATIVE REPORT   Report Status PENDING   Incomplete   CULTURE, BLOOD (ROUTINE X 2)     Status: Normal (Preliminary result)   Collection Time   11/01/11 10:28 AM      Component Value Range Status Comment   Specimen Description BLOOD LEFT ARM   Final    Special Requests BOTTLES DRAWN AEROBIC AND ANAEROBIC 5CC   Final    Setup Time [id number]   Final    Culture     Final    Value:        BLOOD CULTURE RECEIVED NO GROWTH TO DATE CULTURE WILL BE HELD FOR 5 DAYS BEFORE ISSUING A FINAL NEGATIVE REPORT   Report Status PENDING   Incomplete   CULTURE, BLOOD (ROUTINE X 2)     Status: Normal (Preliminary result)   Collection Time   11/01/11  3:00 PM      Component Value Range Status Comment   Specimen Description BLOOD RIGHT HAND   Final    Special Requests BOTTLES DRAWN AEROBIC AND ANAEROBIC 2CC   Final    Setup Time [id number]   Final    Culture     Final    Value:        BLOOD CULTURE RECEIVED NO GROWTH TO DATE CULTURE WILL BE HELD FOR 5 DAYS BEFORE ISSUING A FINAL NEGATIVE REPORT   Report Status PENDING   Incomplete   CULTURE, BLOOD (ROUTINE X 2)     Status: Normal (Preliminary result)   Collection Time   11/01/11  3:20 PM      Component Value  Range Status Comment   Specimen Description BLOOD LEFT ARM   Final    Special Requests BOTTLES  DRAWN AEROBIC AND ANAEROBIC 4CC   Final    Setup Time [id number]   Final    Culture     Final    Value:        BLOOD CULTURE RECEIVED NO GROWTH TO DATE CULTURE WILL BE HELD FOR 5 DAYS BEFORE ISSUING A FINAL NEGATIVE REPORT   Report Status PENDING   Incomplete   URINE CULTURE     Status: Normal   Collection Time   11/02/11 11:18 AM      Component Value Range Status Comment   Specimen Description URINE, CATHETERIZED   Final    Special Requests NONE   Final    Setup Time [id number]   Final    Colony Count NO GROWTH   Final    Culture NO GROWTH   Final    Report Status 11/03/2011 FINAL   Final      Labs: Results for orders placed during the hospital encounter of 11/01/11 (from the past 48 hour(s))  BASIC METABOLIC PANEL     Status: Abnormal   Collection Time   11/03/11  5:59 AM      Component Value Range Comment   Sodium 137  135 - 145 (mEq/L)    Potassium 5.1  3.5 - 5.1 (mEq/L)    Chloride 101  96 - 112 (mEq/L)    CO2 27  19 - 32 (mEq/L)    Glucose, Bld 87  70 - 99 (mg/dL)    BUN 33 (*) 6 - 23 (mg/dL)    Creatinine, Ser 2.84 (*) 0.50 - 1.10 (mg/dL)    Calcium 8.9  8.4 - 10.5 (mg/dL)    GFR calc non Af Amer 27 (*) >90 (mL/min)    GFR calc Af Amer 31 (*) >90 (mL/min)   CBC     Status: Abnormal   Collection Time   11/03/11  5:59 AM      Component Value Range Comment   WBC 10.1  4.0 - 10.5 (K/uL)    RBC 3.40 (*) 3.87 - 5.11 (MIL/uL)    Hemoglobin 10.0 (*) 12.0 - 15.0 (g/dL)    HCT 13.2 (*) 44.0 - 46.0 (%)    MCV 93.8  78.0 - 100.0 (fL)    MCH 29.4  26.0 - 34.0 (pg)    MCHC 31.3  30.0 - 36.0 (g/dL)    RDW 10.2  72.5 - 36.6 (%)    Platelets 459 (*) 150 - 400 (K/uL)   BASIC METABOLIC PANEL     Status: Abnormal   Collection Time   11/04/11  5:04 AM      Component Value Range Comment   Sodium 138  135 - 145 (mEq/L)    Potassium 4.5  3.5 - 5.1 (mEq/L)    Chloride 102  96 - 112  (mEq/L)    CO2 27  19 - 32 (mEq/L)    Glucose, Bld 97  70 - 99 (mg/dL)    BUN 28 (*) 6 - 23 (mg/dL)    Creatinine, Ser 4.40 (*) 0.50 - 1.10 (mg/dL)    Calcium 9.0  8.4 - 10.5 (mg/dL)    GFR calc non Af Amer 31 (*) >90 (mL/min)    GFR calc Af Amer 36 (*) >90 (mL/min)      HPI Patient is a 78 years old female who was admitted this morning with a history of hematuria. She states that she noticed some blood clot in the commode. She is not sure whether it came from the urine  or the vagina. She was discharged from the hospital on January 9 for gross hematuria. She has been on Coumadin and it was thought that then the hematuria was secondary to Coumadin. Her Coumadin was discontinued and she had an IVC filter placement. CT scan about 2 weeks ago showed some surgical clips in the area of the left kidney and right renal cysts with a focal calcification in the inferior aspect of the cyst. She had a hysterectomy in December 2009 for uterine cancer. She denies any other voiding symptoms. A Foley catheter inserted in the bladder drained grossly clear urine. Urinalysis showed 11-20 RBCs, 7-10 WBCs, few bacteria. Her creatinine is 2.36 and was 1.59 on January 9. She denies any flank pain. She does not have nausea or vomiting.   HOSPITAL COURSE: #1 hematuria patient was initiated initially on broad-spectrum antibiotics namely vancomycin and Zosyn because of low blood pressure blood cultures have remained negative to date, urine cultures also remained negative to date, she was switched to Rocephin, she will be switched to cefuroxime that she will continue for another 5 days for discharge. She will need an outpatient cystoscopy by Dr. Brunilda Payor.    #2 acute renal insufficiency 8 and creatinine is around 1.5 she presented with a creatinine of 2.36 this was thought to be prerenal in the setting of a low blood pressure her diuretics were held initially, she was hydrated aggressively with IV fluids. Her creatinine has now  come down to 1.54.  #3 diabetes mellitus seems to be controlled she will continue with her outpatient medication regimen and this is mainly controlled by her diet  #4 history of pulmonary embolism now the patient has an IVC filter  in place   Discharge Exam: Blood pressure 115/69, pulse 86, temperature 98.4 F (36.9 C), temperature source Oral, resp. rate 20, height 5\' 6"  (1.676 m), weight 153.9 kg (339 lb 4.6 oz), SpO2 97.00%.  Physical examination: Nursing notes reviewed; Vital signs and O2 SAT reviewed; Constitutional: Well developed, Well nourished, In no acute distress; Head: Normocephalic, atraumatic; Eyes: EOMI, PERRL, No scleral icterus; ENMT: Mouth and pharynx normal, Mucous membranes dry; Neck: Supple, Full range of motion, No lymphadenopathy; Cardiovascular: Regular rate and rhythm, No murmur or gallop; Respiratory: Breath sounds clear & equal bilaterally, No rales, rhonchi, wheezes, Speaking full sentences with ease., Normal respiratory effort/excursion; Chest: Nontender, Movement normal; Abdomen: Soft, Nontender, Nondistended, Normal bowel sounds; Extremities: Pulses normal, No tenderness, +bilat LE's lymphedema, No calf asymmetry.; Neuro: AA&Ox3, Major CN grossly intact. Speech clear, no facial droop. No gross focal motor deficits in extremities.; Skin: Color normal, Warm, Dry, no rash.        Discharge Orders    Future Appointments: Provider: Department: Dept Phone: Center:   11/21/2011 11:30 AM Paola A. Duard Brady, MD [facility] [phone] None   03/17/2012 11:15 AM Billie Lade, MD [facility] [phone] None     Diet - low sodium heart healthy  Increase activity slowly  Call MD for: severe uncontrolled pain  Call MD for: temperature >100.4  Call MD for: extreme fatigue     Follow-up Information    Follow up with Alva Garnet., MD in 1 week.      Follow up with NESI,MARC-Stennett, MD in 1 week.   Contact information:   [address] [facility] [place] [address] [phone]           Signed: Richarda Overlie 11/04/2011, 12:52 PM

## 2011-11-04 NOTE — Progress Notes (Signed)
OT Screen OT order received. Chart reviewed. Noted order for bedrest with bathroom priviledges. Also, saw not from SW regarding D/C to [facility]. Will defer OT eval to SNF. OT signing off. [facility], OTR/L  [phone] 11/04/2011

## 2011-11-04 NOTE — Progress Notes (Signed)
1/20 Pt stated she felt a "little wet" and wanted her foley checked.  Pinkish creamy discharge noted which appeared vaginal to me was noted on upper inner thighs above foley tube w/pt in supine position.  Foley has been patent all day w/clear yellow urine/no sediment, discharge was not watery like urine.  Genia Harold

## 2011-11-05 LAB — BASIC METABOLIC PANEL
BUN: 21 mg/dL (ref 6–23)
CO2: 26 mEq/L (ref 19–32)
Chloride: 102 mEq/L (ref 96–112)
Creatinine, Ser: 1.26 mg/dL — ABNORMAL HIGH (ref 0.50–1.10)
GFR calc Af Amer: 46 mL/min — ABNORMAL LOW (ref >90)
Glucose, Bld: 97 mg/dL (ref 70–99)

## 2011-11-05 NOTE — Progress Notes (Signed)
Physical Therapy Evaluation Patient Details Name: Christine [name] MRN: [id number] DOB: 1931/02/20 Today's Date: 11/05/2011 Time: 4098-1191  Eval II Problem List:  Patient Active Problem List  Diagnoses  . Uterine cancer  . Chronic back pain  . Pneumonia  . Pulmonary embolism, Oct 2012  . Chronic kidney disease, stage II (mild)  . Hypertension  . GERD (gastroesophageal reflux disease)  . Arthritis, on high dose steroids since 12/16 for toe pain  . Morbid obesity  . Leukocytosis  . Coagulopathy, supratheraputic INR  . Acute diastolic congestive heart failure, 2D Oct 2012  . Lymphedema  . Diabetes mellitus, diet controlled  . Hematuria  . Acute on chronic renal failure  . UTI (lower urinary tract infection)  . AKI (acute kidney injury)    Past Medical History:  Past Medical History  Diagnosis Date  . GERD (gastroesophageal reflux disease)   . Hypertension   . Diabetes mellitus     diet controlled  . Morbid obesity   . Arthritis   . Chronic kidney disease     nephrolithiasis left kidney,s/p open resection  . Status post arthroscopic surgery of right knee   . Diverticulosis of sigmoid colon   . Chronic back pain   . Pneumonia 07/2011    hx  . Pulmonary embolism 07/2011    hx  . Uterine cancer     endometrial high grade adenocarcinoma  . Uterus cancer, sarcoma     high grade invasive carcinosarcoma   Past Surgical History:  Past Surgical History  Procedure Date  . Abdominal hysterectomy     total with b/l salpingo-oopherectomy  . Colonoscopy     negative  . Knee arthroscopy     right knee    PT Assessment/Plan/Recommendation PT Assessment Clinical Impression Statement: Pt presents with diagnosis of hematuria and general weakness. Pt will benefit from skilled PT in the acute care setting to improve strength and activity tolerance and to maximize independence in preperation for continued rehab at [facility] rehab facility. PT Recommendation/Assessment: Patient will  need skilled PT in the acute care venue PT Problem List: Decreased strength;Decreased activity tolerance;Decreased mobility;Obesity PT Therapy Diagnosis : Difficulty walking;Generalized weakness PT Plan PT Frequency: Min 3X/week PT Treatment/Interventions: Gait training;Functional mobility training;Therapeutic activities;Therapeutic exercise;Patient/family education PT Recommendation Follow Up Recommendations: Skilled nursing facility Equipment Recommended: Defer to next venue PT Goals  Acute Rehab PT Goals PT Goal Formulation: With patient Pt will go Supine/Side to Sit: with min assist;with rail PT Goal: Supine/Side to Sit - Progress: Goal set today Pt will go Sit to Supine/Side: with min assist PT Goal: Sit to Supine/Side - Progress: Goal set today Pt will go Sit to Stand: with supervision;with upper extremity assist PT Goal: Sit to Stand - Progress: Goal set today Pt will Ambulate: 51 - 150 feet;with supervision;with least restrictive assistive device PT Goal: Ambulate - Progress: Goal set today  PT Evaluation Precautions/Restrictions    Prior Functioning  Home Living Lives With: Sheran Spine Help From: Family Additional Comments: Pt planning to D/C to [facility] for ST rehab Prior Function Level of Independence: Needs assistance with ADLs;Requires assistive device for independence;Needs assistance with homemaking Cognition Cognition Arousal/Alertness: Awake/alert Overall Cognitive Status: Appears within functional limits for tasks assessed Sensation/Coordination Sensation Light Touch: Appears Intact Coordination Gross Motor Movements are Fluid and Coordinated: Yes Extremity Assessment RLE Strength RLE Overall Strength Comments: Strength > or = 4/5 LLE Strength LLE Overall Strength Comments: Strength > or = 4/5 Mobility (including Balance) Bed Mobility Bed Mobility: No  Transfers Transfers: Yes Sit to Stand: 4: Min assist Sit to Stand Details (indicate cue type and  reason): VCs safety, technique, hand placement. Assist to stabilize.  Stand to Sit: 4: Min assist Stand to Sit Details: VCs safety, technique, hand placement. Assist to control descent Ambulation/Gait Ambulation/Gait: Yes Ambulation/Gait Assistance: 4: Min assist Ambulation/Gait Assistance Details (indicate cue type and reason): Assist to stabilize. Followed with recliner-pt fatigues easily. Noted dyspnea 3/4 after 40-45 feet.  Ambulation Distance (Feet): 50 Feet (x 2.) Assistive device: Rolling walker Gait Pattern: Step-through pattern;Trunk flexed  Posture/Postural Control Posture/Postural Control: No significant limitations Exercise    End of Session PT - End of Session Equipment Utilized During Treatment: Gait belt Activity Tolerance: Patient limited by fatigue Patient left: in chair;with call bell in reach General Behavior During Session: [facility] for tasks performed Cognition: [facility] for tasks performed  [name] [facility] 11/05/2011, 11:35 AM [phone]

## 2011-11-05 NOTE — Progress Notes (Signed)
Patient set to discharge to [facility] today. Facility aware. [facility] called for transportation.   Unice Bailey, LCSWA [phone]

## 2011-11-20 ENCOUNTER — Encounter: Payer: Self-pay | Admitting: Gynecologic Oncology

## 2011-11-21 ENCOUNTER — Other Ambulatory Visit (HOSPITAL_COMMUNITY)
Admission: RE | Admit: 2011-11-21 | Discharge: 2011-11-21 | Disposition: A | Discharge status: Home / Self Care | Payer: Medicare Other | Source: Ambulatory Visit | Attending: Gynecologic Oncology | Admitting: Gynecologic Oncology

## 2011-11-21 ENCOUNTER — Encounter: Payer: Self-pay | Admitting: Gynecologic Oncology

## 2011-11-21 ENCOUNTER — Ambulatory Visit: Payer: Medicare Other | Attending: Gynecologic Oncology | Admitting: Gynecologic Oncology

## 2011-11-21 VITALS — BP 110/80 | HR 96 | Temp 98.9°F | Resp 20 | Ht 66.0 in | Wt 317.0 lb

## 2011-11-21 DIAGNOSIS — M129 Arthropathy, unspecified: Secondary | ICD-10-CM | POA: Insufficient documentation

## 2011-11-21 DIAGNOSIS — C541 Malignant neoplasm of endometrium: Secondary | ICD-10-CM

## 2011-11-21 DIAGNOSIS — I10 Essential (primary) hypertension: Secondary | ICD-10-CM | POA: Insufficient documentation

## 2011-11-21 DIAGNOSIS — Z86711 Personal history of pulmonary embolism: Secondary | ICD-10-CM | POA: Insufficient documentation

## 2011-11-21 DIAGNOSIS — Z803 Family history of malignant neoplasm of breast: Secondary | ICD-10-CM | POA: Insufficient documentation

## 2011-11-21 DIAGNOSIS — Z01419 Encounter for gynecological examination (general) (routine) without abnormal findings: Secondary | ICD-10-CM | POA: Insufficient documentation

## 2011-11-21 DIAGNOSIS — Z90710 Acquired absence of both cervix and uterus: Secondary | ICD-10-CM | POA: Insufficient documentation

## 2011-11-21 DIAGNOSIS — Z9079 Acquired absence of other genital organ(s): Secondary | ICD-10-CM | POA: Insufficient documentation

## 2011-11-21 DIAGNOSIS — Z800 Family history of malignant neoplasm of digestive organs: Secondary | ICD-10-CM | POA: Insufficient documentation

## 2011-11-21 DIAGNOSIS — E119 Type 2 diabetes mellitus without complications: Secondary | ICD-10-CM | POA: Insufficient documentation

## 2011-11-21 DIAGNOSIS — Z79899 Other long term (current) drug therapy: Secondary | ICD-10-CM | POA: Insufficient documentation

## 2011-11-21 DIAGNOSIS — K219 Gastro-esophageal reflux disease without esophagitis: Secondary | ICD-10-CM | POA: Insufficient documentation

## 2011-11-21 DIAGNOSIS — Z923 Personal history of irradiation: Secondary | ICD-10-CM | POA: Insufficient documentation

## 2011-11-21 DIAGNOSIS — C549 Malignant neoplasm of corpus uteri, unspecified: Secondary | ICD-10-CM | POA: Insufficient documentation

## 2011-11-21 DIAGNOSIS — Z8049 Family history of malignant neoplasm of other genital organs: Secondary | ICD-10-CM | POA: Insufficient documentation

## 2011-11-21 NOTE — Patient Instructions (Signed)
RTC 1 year

## 2011-11-21 NOTE — Progress Notes (Signed)
Consult Note: Gyn-Onc  Christine [name] 78 y.o. female  CC:  Chief Complaint  Patient presents with  . Endometrial cancer    Follow up    HPI: [name] is a very pleasant 78 year old, who underwent TAH-BSO in December 2009, for endometrial carcinoma. Pathology revealed a superficially invasive high-grade carcinosarcoma confined to the inner  half of the myometrium, but with cervical stromal involvement. That was 0.1 out of 2.4 cm depth of cervical invasion with lymphovascular space involvement. The adnexa were negative. After lengthy discussions  regarding chemotherapy and radiation, she opted for radiation therapy and she underwent postoperative external beam radiation and vaginal cuff brachytherapy under the care of Dr. Roselind Messier. I last saw her in February  2012, at which time, her exam was unremarkable, but her Pap smear revealed atypical squamous cells of undetermined significance. Followup exam and Pap by Dr. Roselind Messier in May 2012, revealed a Pap smear that was  negative, but there was reparative atypia consistent with a history of radiation therapy. She comes in today for followup, accompanied by her daughter.   REVIEW OF SYSTEMS: Really completely comprised essentially by the pain she has had in her hip and her lower back. She has had x-rays, CT scans, MRIs, and injections to her back. This has all been superceded by hematuria due to a supratherapeutic INR.  She is currently off of her coumadin and had an IVC filter placed.  She is in acute rehab and is going home today. She was told that she had CHF and has decreased her sodium intake significantly and is also been placed on Lasix twice a day. As a result she's lost about 30 pounds as compared to her weight August of 2012. Most of this she states has been water weight and it has been making her legs feel much better. She denies any vaginal bleeding. She has limited mobility secondary to her lymphedema  in her pain. She denies any unintentional  weight loss, weight gain,  abdominal pain, or chest pain. Ten-point review of systems is, otherwise, negative except as above.     Interval History:   Review of Systems  Current Meds:  Outpatient Encounter Prescriptions as of 11/21/2011  Medication Sig Dispense Refill  . amLODipine (NORVASC) 10 MG tablet Take 10 mg by mouth every morning.       . ezetimibe (ZETIA) 10 MG tablet Take 10 mg by mouth daily.       . furosemide (LASIX) 40 MG tablet Take 1 tablet (40 mg total) by mouth 2 (two) times daily.  60 tablet  1  . HYDROcodone-acetaminophen (NORCO) 10-325 MG per tablet Take 1 tablet by mouth every 6 (six) hours as needed. For pain.       [name] metoCLOPramide (REGLAN) 10 MG tablet Take 10 mg by mouth 4 (four) times daily as needed. For nausea. Patient states that she takes once a day as needed.      . metoprolol succinate (TOPROL-XL) 25 MG 24 hr tablet Take 25 mg by mouth daily.        [name] nystatin (MYCOSTATIN) powder Apply 1 g topically 4 (four) times daily. Rash      . zolpidem (AMBIEN) 5 MG tablet Take 5 mg by mouth at bedtime as needed. For sleep.        Allergy: No Known Allergies  Social Hx:   History   Social History  . Marital Status: Divorced    Spouse Name: N/A    Number of Children: N/A  .  Years of Education: N/A   Occupational History  . Not on file.   Social History Main Topics  . Smoking status: Never Smoker   . Smokeless tobacco: Never Used  . Alcohol Use: No  . Drug Use: No  . Sexually Active: No   Other Topics Concern  . Not on file   Social History Narrative  . No narrative on file    Past Surgical Hx:  Past Surgical History  Procedure Date  . Abdominal hysterectomy     total with b/l salpingo-oopherectomy  . Colonoscopy     negative  . Knee arthroscopy     right knee    Past Medical Hx:  Past Medical History  Diagnosis Date  . GERD (gastroesophageal reflux disease)   . Hypertension   . Diabetes mellitus     diet controlled  . Morbid  obesity   . Arthritis   . Chronic kidney disease     nephrolithiasis left kidney,s/p open resection  . Status post arthroscopic surgery of right knee   . Diverticulosis of sigmoid colon   . Chronic back pain   . Pneumonia 07/2011    hx  . Pulmonary embolism 07/2011    hx  . Uterine cancer     endometrial high grade adenocarcinoma  . Uterus cancer, sarcoma     high grade invasive carcinosarcoma    Family Hx:  Family History  Problem Relation Age of Onset  . Breast cancer Sister     1st sister  . Colon cancer Sister     2nd sister  . Cervical cancer Sister     3rd sister    Vitals:  Blood pressure 110/80, pulse 96, temperature 98.9 F (37.2 C), temperature source Oral, resp. rate 20, height 5\' 6"  (1.676 m), weight 317 lb (143.79 kg).  Physical Exam: GENERAL: Well-nourished, well-developed female, in no acute distress.  NECK: Supple. There is no lymphadenopathy. No thyromegaly.  LUNGS: Clear to auscultation bilaterally.  CARDIOVASCULAR EXAM: Regular rate and rhythm. 3/6 SEM ABDOMEN: Morbidly obese. She has a well-healed vertical midline incision. There is no obvious incisional hernia. She does have changes consistent with candida under the pannus. Groins are negative for adenopathy.  EXTREMITIES: There is massive edema, equal bilaterally consistent with congenital lymphedema. There are chronic venous stasis changes and scaling of her legs.  PELVIC: External genitalia are within normal limits. The vagina is atrophic and foreshortened. There are no gross visible lesions. ThinPrep Pap was submitted without difficulty. Bimanual examination is limited by the patient's habitus; however, there are no obvious masses or nodularity.   Assessment/Plan:  78 year old with a stage IIA uterine carcinosarcoma, who was incompletely staged. She underwent postoperative radiation and is doing well with no evidence of recurrent disease almost 3 years out from  time of her diagnosis.  PLAN:  1. We  will follow up the results for her Pap smear from today. She will see Dr. Roselind Messier in 6 months and return to see [place] in 12 months.  2. She will continue following up with her other providers as scheduled.      Rondal Vandevelde A., MD 11/21/2011, 12:06 PM

## 2011-12-03 ENCOUNTER — Emergency Department (HOSPITAL_COMMUNITY): Payer: Medicare Other

## 2011-12-03 ENCOUNTER — Encounter (HOSPITAL_COMMUNITY): Payer: Self-pay | Admitting: Emergency Medicine

## 2011-12-03 ENCOUNTER — Other Ambulatory Visit: Payer: Self-pay

## 2011-12-03 ENCOUNTER — Inpatient Hospital Stay (HOSPITAL_COMMUNITY)
Admission: EM | Admit: 2011-12-03 | Discharge: 2011-12-10 | DRG: 684 | Disposition: A | Discharge status: Skilled Nursing Facility | Payer: Medicare Other | Attending: Internal Medicine | Admitting: Internal Medicine

## 2011-12-03 DIAGNOSIS — R599 Enlarged lymph nodes, unspecified: Secondary | ICD-10-CM | POA: Diagnosis present

## 2011-12-03 DIAGNOSIS — N184 Chronic kidney disease, stage 4 (severe): Secondary | ICD-10-CM | POA: Diagnosis present

## 2011-12-03 DIAGNOSIS — R059 Cough, unspecified: Secondary | ICD-10-CM | POA: Diagnosis present

## 2011-12-03 DIAGNOSIS — N179 Acute kidney failure, unspecified: Principal | ICD-10-CM | POA: Diagnosis present

## 2011-12-03 DIAGNOSIS — N189 Chronic kidney disease, unspecified: Secondary | ICD-10-CM | POA: Diagnosis present

## 2011-12-03 DIAGNOSIS — C55 Malignant neoplasm of uterus, part unspecified: Secondary | ICD-10-CM | POA: Diagnosis present

## 2011-12-03 DIAGNOSIS — D72829 Elevated white blood cell count, unspecified: Secondary | ICD-10-CM | POA: Diagnosis present

## 2011-12-03 DIAGNOSIS — R0789 Other chest pain: Secondary | ICD-10-CM | POA: Diagnosis present

## 2011-12-03 DIAGNOSIS — N182 Chronic kidney disease, stage 2 (mild): Secondary | ICD-10-CM | POA: Diagnosis present

## 2011-12-03 DIAGNOSIS — I890 Lymphedema, not elsewhere classified: Secondary | ICD-10-CM | POA: Diagnosis present

## 2011-12-03 DIAGNOSIS — E119 Type 2 diabetes mellitus without complications: Secondary | ICD-10-CM | POA: Diagnosis present

## 2011-12-03 DIAGNOSIS — Z87442 Personal history of urinary calculi: Secondary | ICD-10-CM

## 2011-12-03 DIAGNOSIS — I10 Essential (primary) hypertension: Secondary | ICD-10-CM | POA: Diagnosis present

## 2011-12-03 DIAGNOSIS — Z86711 Personal history of pulmonary embolism: Secondary | ICD-10-CM

## 2011-12-03 DIAGNOSIS — I129 Hypertensive chronic kidney disease with stage 1 through stage 4 chronic kidney disease, or unspecified chronic kidney disease: Secondary | ICD-10-CM | POA: Diagnosis present

## 2011-12-03 DIAGNOSIS — R05 Cough: Secondary | ICD-10-CM | POA: Diagnosis present

## 2011-12-03 DIAGNOSIS — J069 Acute upper respiratory infection, unspecified: Secondary | ICD-10-CM | POA: Diagnosis present

## 2011-12-03 DIAGNOSIS — I2699 Other pulmonary embolism without acute cor pulmonale: Secondary | ICD-10-CM | POA: Diagnosis present

## 2011-12-03 DIAGNOSIS — R0600 Dyspnea, unspecified: Secondary | ICD-10-CM

## 2011-12-03 HISTORY — DX: Acute myocardial infarction, unspecified: I21.9

## 2011-12-03 HISTORY — DX: Bronchitis, not specified as acute or chronic: J40

## 2011-12-03 HISTORY — DX: Personal history of other diseases of the circulatory system: Z86.79

## 2011-12-03 NOTE — ED Notes (Signed)
Pt assisted by 2 staff members into bed. pts O2 level did drop to 86% just during transfer from wheelchair to bed.

## 2011-12-03 NOTE — ED Notes (Signed)
Pt alert, nad, c/o sob, weakness, onset several days ago, recently inpt, sent to rehab, onset upon discharge, resp even unlabored, skin wd,denies chest pain

## 2011-12-04 ENCOUNTER — Emergency Department (HOSPITAL_COMMUNITY): Payer: Medicare Other

## 2011-12-04 ENCOUNTER — Encounter (HOSPITAL_COMMUNITY): Payer: Self-pay | Admitting: Internal Medicine

## 2011-12-04 DIAGNOSIS — R0789 Other chest pain: Secondary | ICD-10-CM | POA: Diagnosis present

## 2011-12-04 DIAGNOSIS — J069 Acute upper respiratory infection, unspecified: Secondary | ICD-10-CM | POA: Diagnosis present

## 2011-12-04 LAB — POCT I-STAT, CHEM 8
BUN: 44 mg/dL — ABNORMAL HIGH (ref 6–23)
Calcium, Ion: 1.16 mmol/L (ref 1.12–1.32)
Chloride: 100 mEq/L (ref 96–112)
Creatinine, Ser: 2 mg/dL — ABNORMAL HIGH (ref 0.50–1.10)
Glucose, Bld: 139 mg/dL — ABNORMAL HIGH (ref 70–99)
Potassium: 4.2 mEq/L (ref 3.5–5.1)

## 2011-12-04 LAB — CBC
HCT: 34.4 % — ABNORMAL LOW (ref 36.0–46.0)
Hemoglobin: 11.4 g/dL — ABNORMAL LOW (ref 12.0–15.0)
MCH: 28.8 pg (ref 26.0–34.0)
MCH: 30.4 pg (ref 26.0–34.0)
MCHC: 31.4 g/dL (ref 30.0–36.0)
MCHC: 33.1 g/dL (ref 30.0–36.0)
MCV: 91.7 fL (ref 78.0–100.0)
Platelets: 463 10*3/uL — ABNORMAL HIGH (ref 150–400)
RBC: 3.68 MIL/uL — ABNORMAL LOW (ref 3.87–5.11)
RBC: 3.75 MIL/uL — ABNORMAL LOW (ref 3.87–5.11)

## 2011-12-04 LAB — PROTIME-INR
INR: 1.1 (ref 0.00–1.49)
Prothrombin Time: 14.4 s (ref 11.6–15.2)

## 2011-12-04 LAB — URINALYSIS, ROUTINE W REFLEX MICROSCOPIC
Bilirubin Urine: NEGATIVE
Glucose, UA: NEGATIVE mg/dL
Ketones, ur: NEGATIVE mg/dL
Nitrite: NEGATIVE
Specific Gravity, Urine: 1.019 (ref 1.005–1.030)
pH: 5.5 (ref 5.0–8.0)

## 2011-12-04 LAB — URINE MICROSCOPIC-ADD ON

## 2011-12-04 LAB — BASIC METABOLIC PANEL
Calcium: 9.4 mg/dL (ref 8.4–10.5)
GFR calc Af Amer: 28 mL/min — ABNORMAL LOW (ref >90)
GFR calc non Af Amer: 24 mL/min — ABNORMAL LOW (ref >90)
Glucose, Bld: 113 mg/dL — ABNORMAL HIGH (ref 70–99)
Sodium: 133 mEq/L — ABNORMAL LOW (ref 135–145)

## 2011-12-04 LAB — PRO B NATRIURETIC PEPTIDE: Pro B Natriuretic peptide (BNP): 135.1 pg/mL (ref 0–450)

## 2011-12-04 MED ORDER — VANCOMYCIN HCL 1000 MG IV SOLR
2000.0000 mg | INTRAVENOUS | Status: DC
  Administered 2011-12-06: 2000 mg via INTRAVENOUS
  Filled 2011-12-04: qty 2000

## 2011-12-04 MED ORDER — METOCLOPRAMIDE HCL 10 MG PO TABS
10.0000 mg | ORAL_TABLET | Freq: Four times a day (QID) | ORAL | Status: DC | PRN
  Administered 2011-12-05 – 2011-12-06 (×3): 10 mg via ORAL
  Filled 2011-12-04 (×3): qty 1

## 2011-12-04 MED ORDER — VANCOMYCIN HCL IN DEXTROSE 1-5 GM/200ML-% IV SOLN
1000.0000 mg | Freq: Once | INTRAVENOUS | Status: AC
  Administered 2011-12-04: 1000 mg via INTRAVENOUS
  Filled 2011-12-04 (×2): qty 200

## 2011-12-04 MED ORDER — NYSTATIN 100000 UNIT/GM EX POWD
Freq: Four times a day (QID) | CUTANEOUS | Status: DC
  Administered 2011-12-04 – 2011-12-10 (×24): via TOPICAL
  Filled 2011-12-04 (×2): qty 15

## 2011-12-04 MED ORDER — DEXTROSE 5 % IV SOLN
3.3750 g | Freq: Once | INTRAVENOUS | Status: AC
  Administered 2011-12-04: 3.375 g via INTRAVENOUS
  Filled 2011-12-04: qty 3.38

## 2011-12-04 MED ORDER — AMLODIPINE BESYLATE 10 MG PO TABS
10.0000 mg | ORAL_TABLET | Freq: Every day | ORAL | Status: DC
  Administered 2011-12-04 – 2011-12-09 (×6): 10 mg via ORAL
  Filled 2011-12-04 (×7): qty 1

## 2011-12-04 MED ORDER — HEPARIN SODIUM (PORCINE) 5000 UNIT/ML IJ SOLN
5000.0000 [IU] | Freq: Three times a day (TID) | INTRAMUSCULAR | Status: DC
Start: 2011-12-04 — End: 2011-12-10
  Administered 2011-12-04 – 2011-12-10 (×20): 5000 [IU] via SUBCUTANEOUS
  Filled 2011-12-04 (×25): qty 1

## 2011-12-04 MED ORDER — METOPROLOL SUCCINATE ER 25 MG PO TB24
25.0000 mg | ORAL_TABLET | Freq: Every day | ORAL | Status: DC
  Administered 2011-12-04 – 2011-12-10 (×6): 25 mg via ORAL
  Filled 2011-12-04 (×8): qty 1

## 2011-12-04 MED ORDER — TECHNETIUM TO 99M ALBUMIN AGGREGATED
3.0000 | Freq: Once | INTRAVENOUS | Status: AC | PRN
  Administered 2011-12-04: 3 via INTRAVENOUS

## 2011-12-04 MED ORDER — ZOLPIDEM TARTRATE 5 MG PO TABS
5.0000 mg | ORAL_TABLET | Freq: Every evening | ORAL | Status: DC | PRN
  Administered 2011-12-04 – 2011-12-09 (×4): 5 mg via ORAL
  Filled 2011-12-04 (×4): qty 1

## 2011-12-04 MED ORDER — PIPERACILLIN-TAZOBACTAM 3.375 G IVPB
3.3750 g | Freq: Three times a day (TID) | INTRAVENOUS | Status: DC
  Administered 2011-12-04 – 2011-12-06 (×7): 3.375 g via INTRAVENOUS
  Filled 2011-12-04 (×11): qty 50

## 2011-12-04 MED ORDER — EZETIMIBE 10 MG PO TABS
10.0000 mg | ORAL_TABLET | Freq: Every day | ORAL | Status: DC
  Administered 2011-12-04 – 2011-12-10 (×7): 10 mg via ORAL
  Filled 2011-12-04 (×8): qty 1

## 2011-12-04 MED ORDER — XENON XE 133 GAS
11.0000 | GAS_FOR_INHALATION | Freq: Once | RESPIRATORY_TRACT | Status: AC | PRN
  Administered 2011-12-04: 11 via RESPIRATORY_TRACT

## 2011-12-04 MED ORDER — SODIUM CHLORIDE 0.9 % IJ SOLN
3.0000 mL | INTRAMUSCULAR | Status: DC | PRN
  Administered 2011-12-09: 3 mL via INTRAVENOUS

## 2011-12-04 MED ORDER — FUROSEMIDE 40 MG PO TABS
40.0000 mg | ORAL_TABLET | Freq: Two times a day (BID) | ORAL | Status: DC
  Administered 2011-12-04 – 2011-12-06 (×5): 40 mg via ORAL
  Filled 2011-12-04 (×8): qty 1

## 2011-12-04 MED ORDER — NYSTATIN 100000 UNIT/GM EX POWD
1.0000 g | Freq: Four times a day (QID) | CUTANEOUS | Status: DC
  Filled 2011-12-04 (×16): qty 1

## 2011-12-04 MED ORDER — SODIUM CHLORIDE 0.9 % IV SOLN: 250.0000 mL | INTRAVENOUS | Status: DC | PRN

## 2011-12-04 MED ORDER — HYDROCODONE-ACETAMINOPHEN 10-325 MG PO TABS
1.0000 | ORAL_TABLET | Freq: Four times a day (QID) | ORAL | Status: DC | PRN
  Administered 2011-12-04 – 2011-12-09 (×12): 1 via ORAL
  Filled 2011-12-04 (×12): qty 1

## 2011-12-04 MED ORDER — SODIUM CHLORIDE 0.9 % IJ SOLN
3.0000 mL | Freq: Two times a day (BID) | INTRAMUSCULAR | Status: DC
  Administered 2011-12-04 – 2011-12-10 (×9): 3 mL via INTRAVENOUS

## 2011-12-04 MED ORDER — SENNA 8.6 MG PO TABS
1.0000 | ORAL_TABLET | Freq: Every day | ORAL | Status: DC
  Administered 2011-12-04 – 2011-12-10 (×7): 8.6 mg via ORAL
  Filled 2011-12-04 (×7): qty 1

## 2011-12-04 MED ORDER — VANCOMYCIN HCL IN DEXTROSE 1-5 GM/200ML-% IV SOLN
1000.0000 mg | Freq: Once | INTRAVENOUS | Status: AC
Start: 2011-12-04 — End: 2011-12-04
  Administered 2011-12-04: 1000 mg via INTRAVENOUS
  Filled 2011-12-04: qty 200

## 2011-12-04 NOTE — ED Provider Notes (Signed)
History     CSN: [id number]  Arrival date & time 12/03/11  2241   First MD Initiated Contact with Patient 12/03/11 2303      Chief Complaint  Patient presents with  . Shortness of Breath  . Weakness    (Consider location/radiation/quality/duration/timing/severity/associated sxs/prior treatment) HPI Dyspnea with some right-sided chest pain. History of PE. Patient recently had IVC filter placed and had her Coumadin stopped due to vaginal bleeding. She was recently discharged home from rehabilitation and presents tonight with shortness of breath. Her son is bedside and reports that she has been complaining of some right-sided chest pains the patient denies any current chest pain. She has generalized weakness. No cough. No rash. Symptoms mild to moderate severity. Patient denies any current vaginal bleeding or abdominal pain or pelvic pain. No nausea vomiting or diarrhea. Symptoms worse with exertion and improved with rest. Past Medical History  Diagnosis Date  . GERD (gastroesophageal reflux disease)   . Hypertension   . Diabetes mellitus     diet controlled  . Morbid obesity   . Arthritis   . Chronic kidney disease     nephrolithiasis left kidney,s/p open resection  . Status post arthroscopic surgery of right knee   . Diverticulosis of sigmoid colon   . Chronic back pain   . Pneumonia 07/2011    hx  . Pulmonary embolism 07/2011    hx  . Uterine cancer     endometrial high grade adenocarcinoma  . Uterus cancer, sarcoma     high grade invasive carcinosarcoma    Past Surgical History  Procedure Date  . Abdominal hysterectomy     total with b/l salpingo-oopherectomy  . Colonoscopy     negative  . Knee arthroscopy     right knee    Family History  Problem Relation Age of Onset  . Breast cancer Sister     1st sister  . Colon cancer Sister     2nd sister  . Cervical cancer Sister     3rd sister    History  Substance Use Topics  . Smoking status: Never Smoker   .  Smokeless tobacco: Never Used  . Alcohol Use: No    OB History    Grav Para Term Preterm Abortions TAB SAB Ect Mult Living   9 5              Review of Systems  Constitutional: Negative for fever and chills.  HENT: Negative for neck pain and neck stiffness.   Eyes: Negative for pain.  Respiratory: Positive for shortness of breath.   Cardiovascular: Negative for chest pain.  Gastrointestinal: Negative for abdominal pain.  Genitourinary: Negative for dysuria.  Musculoskeletal: Negative for back pain.  Skin: Negative for rash.  Neurological: Negative for headaches.  All other systems reviewed and are negative.    Allergies  Review of patient's allergies indicates no known allergies.  Home Medications   Current Outpatient Rx  Name Route Sig Dispense Refill  . AMLODIPINE BESYLATE 10 MG PO TABS Oral Take 10 mg by mouth every morning.     [name] EZETIMIBE 10 MG PO TABS Oral Take 10 mg by mouth daily.     . FUROSEMIDE 40 MG PO TABS Oral Take 1 tablet (40 mg total) by mouth 2 (two) times daily. 60 tablet 1  . HYDROCODONE-ACETAMINOPHEN 10-325 MG PO TABS Oral Take 1 tablet by mouth every 6 (six) hours as needed. For pain.     [name] METOCLOPRAMIDE HCL 10  MG PO TABS Oral Take 10 mg by mouth 4 (four) times daily as needed. For nausea. Patient states that she takes once a day as needed.    [name] METOPROLOL SUCCINATE ER 25 MG PO TB24 Oral Take 25 mg by mouth daily.      . NYSTATIN 100000 UNIT/GM EX POWD Topical Apply 1 g topically 4 (four) times daily. Rash    . SENNA 8.6 MG PO TABS Oral Take 1 tablet by mouth.    . ZOLPIDEM TARTRATE 5 MG PO TABS Oral Take 5 mg by mouth at bedtime as needed. For sleep.      BP 137/58  Pulse 99  Temp(Src) 98.8 F (37.1 C) (Oral)  Resp 16  Wt 317 lb (143.79 kg)  SpO2 97%  Physical Exam  Constitutional: She is oriented to person, place, and time. She appears well-developed and well-nourished.  HENT:  Head: Normocephalic and atraumatic.  Eyes: Conjunctivae and  EOM are normal. Pupils are equal, round, and reactive to light.  Neck: Trachea normal. Neck supple. No thyromegaly present.  Cardiovascular: Normal rate, regular rhythm, S1 normal, S2 normal and normal pulses.     No systolic murmur is present   No diastolic murmur is present  Pulses:      Radial pulses are 2+ on the right side, and 2+ on the left side.  Pulmonary/Chest: Effort normal and breath sounds normal. She has no wheezes. She has no rhonchi. She has no rales. She exhibits no tenderness.  Abdominal: Soft. Normal appearance and bowel sounds are normal. There is no tenderness. There is no CVA tenderness and negative Murphy's sign.  Musculoskeletal:       BLE:s Calves nontender, no cords or erythema, negative Homans sign  Neurological: She is alert and oriented to person, place, and time. She has normal strength. No cranial nerve deficit or sensory deficit. GCS eye subscore is 4. GCS verbal subscore is 5. GCS motor subscore is 6.  Skin: Skin is warm and dry. No rash noted. She is not diaphoretic.  Psychiatric: Her speech is normal.       Cooperative and appropriate    ED Course  Procedures (including critical care time)  Labs Reviewed  CBC - Abnormal; Notable for the following:    WBC 25.6 (*)    RBC 3.75 (*)    Hemoglobin 11.4 (*)    HCT 34.4 (*)    Platelets 482 (*)    All other components within normal limits  POCT I-STAT, CHEM 8 - Abnormal; Notable for the following:    BUN 44 (*)    Creatinine, Ser 2.00 (*)    Glucose, Bld 139 (*)    All other components within normal limits  URINALYSIS, ROUTINE W REFLEX MICROSCOPIC - Abnormal; Notable for the following:    APPearance CLOUDY (*)    Hgb urine dipstick TRACE (*)    Leukocytes, UA SMALL (*)    All other components within normal limits  URINE MICROSCOPIC-ADD ON - Abnormal; Notable for the following:    Squamous Epithelial / LPF MANY (*)    Bacteria, UA MANY (*)    All other components within normal limits  PRO B  NATRIURETIC PEPTIDE  PROTIME-INR  POCT I-STAT TROPONIN I  BASIC METABOLIC PANEL  CBC   Dg Chest 2 View  12/03/2011  *RADIOLOGY REPORT*  Clinical Data: Shortness of breath and weakness.  Bilateral leg swelling.  CHEST - 2 VIEW  Comparison: 09/29/2011.  Findings: Poor inspiration.  No gross change  in mild enlargement of the cardiac silhouette.  Decreased prominence of the pulmonary vasculature and interstitial markings.  Lower thoracic spine degenerative changes.  IMPRESSION: Cardiomegaly with decreased pulmonary vascular congestion and improved interstitial pulmonary edema.  Original Report Authenticated By: Darrol Angel, M.D.     1. Dyspnea     Date: 12/04/2011  Rate: 96  Rhythm: normal sinus rhythm  QRS Axis: left  Intervals: normal  ST/T Wave abnormalities: nonspecific ST/T changes  Conduction Disutrbances:none  Narrative Interpretation: LVH  Old EKG Reviewed: unchanged     MDM   Shortness of breath with concern for recurrent PE, despite filter. Elevated creatinine and not a good candidate for CT scan at this time. Plan admit for nuclear medicine study in the morning. Doubt ACS. Labs reviewed and elevated white blood cell count noted. No obvious pneumonia on chest x-ray and no fever, antibiotics initiated.  Case discussed as above with [facility] hospitalist Dr. Conley Rolls who agrees to admission.        Sunnie Nielsen, MD 12/04/11 [phone]

## 2011-12-04 NOTE — H&P (Signed)
PCP:   Alva Garnet., MD, MD   Chief Complaint: Right-sided chest pain   HPI: Christine [name] is an 78 y.o. female with history of recurrent PE, severe vaginal bleed from endometrial carcinoma, status post IVC placement because of vaginal bleed, morbid obesity, hypertension, diet-controlled diabetes, chronic kidney disease, history of nephrolithiasis, status post hysterectomy with bilateral salpingo-oophorectomy, brought in by her son to the emergency room because of right lower rib pain. She stated she has been coughing some yellow sputum productive cough. Now in the emergency room, she also has left lower rib pain as well. She denied any shortness of breath or substernal chest pain. Evaluation in the emergency room included a chest x-ray which shows improved vascular congestion, a creatinine of 2.0, and leukocytosis with white count of 25,000. Her EKG showed no acute ST-T changes. She was started on vancomycin and Zosyn in the emergency room, and hospitalist was asked to admit patient for ventilation perfusion scan. Her renal insufficiency precludes the use of a CT pulmonary angiogram.  Rewiew of Systems:  The patient denies anorexia, fever, weight loss,, vision loss, decreased hearing, hoarseness, syncope, balance deficits, hemoptysis, abdominal pain, melena, hematochezia, severe indigestion/heartburn, hematuria, incontinence, genital sores, muscle weakness, suspicious skin lesions, transient blindness, difficulty walking, depression, unusual weight change, abnormal bleeding, enlarged lymph nodes, angioedema, and breast masses.   Past Medical History  Diagnosis Date  . GERD (gastroesophageal reflux disease)   . Hypertension   . Diabetes mellitus     diet controlled  . Morbid obesity   . Arthritis   . Chronic kidney disease     nephrolithiasis left kidney,s/p open resection  . Status post arthroscopic surgery of right knee   . Diverticulosis of sigmoid colon   . Chronic back pain   .  Pneumonia 07/2011    hx  . Pulmonary embolism 07/2011    hx  . Uterine cancer     endometrial high grade adenocarcinoma  . Uterus cancer, sarcoma     high grade invasive carcinosarcoma    Past Surgical History  Procedure Date  . Abdominal hysterectomy     total with b/l salpingo-oopherectomy  . Colonoscopy     negative  . Knee arthroscopy     right knee    Medications:  HOME MEDS: Prior to Admission medications   Medication Sig Start Date End Date Taking? Authorizing Provider  amLODipine (NORVASC) 10 MG tablet Take 10 mg by mouth every morning.    Yes Historical Provider, MD  ezetimibe (ZETIA) 10 MG tablet Take 10 mg by mouth daily.    Yes Historical Provider, MD  furosemide (LASIX) 40 MG tablet Take 1 tablet (40 mg total) by mouth 2 (two) times daily. 10/06/11 10/05/12 Yes Erick Blinks, MD  HYDROcodone-acetaminophen (NORCO) 10-325 MG per tablet Take 1 tablet by mouth every 6 (six) hours as needed. For pain.    Yes Historical Provider, MD  metoCLOPramide (REGLAN) 10 MG tablet Take 10 mg by mouth 4 (four) times daily as needed. For nausea. Patient states that she takes once a day as needed.   Yes Historical Provider, MD  metoprolol succinate (TOPROL-XL) 25 MG 24 hr tablet Take 25 mg by mouth daily.     Yes Historical Provider, MD  nystatin (MYCOSTATIN) powder Apply 1 g topically 4 (four) times daily. Rash   Yes Historical Provider, MD  senna (SENOKOT) 8.6 MG TABS Take 1 tablet by mouth.   Yes Historical Provider, MD  zolpidem (AMBIEN) 5 MG tablet Take 5  mg by mouth at bedtime as needed. For sleep.   Yes Historical Provider, MD     Allergies:  No Known Allergies  Social History:   reports that she has never smoked. She has never used smokeless tobacco. She reports that she does not drink alcohol or use illicit drugs.  Family History: Family History  Problem Relation Age of Onset  . Breast cancer Sister     1st sister  . Colon cancer Sister     2nd sister  . Cervical  cancer Sister     3rd sister     Physical Exam: Filed Vitals:   12/03/11 2250 12/04/11 0257  BP: 137/58   Pulse: 99   Temp: 98.8 F (37.1 C)   TempSrc: Oral   Resp: 16   Height:  5' 6.14" (1.68 m)  Weight: 143.79 kg (317 lb)   SpO2: 97%    Blood pressure 137/58, pulse 99, temperature 98.8 F (37.1 C), temperature source Oral, resp. rate 16, height 5' 6.14" (1.68 m), weight 143.79 kg (317 lb), SpO2 97.00%.  GEN:  Pleasant  person lying in the stretcher in no acute distress; cooperative with exam PSYCH:  alert and oriented x4; does not appear anxious does not appear depressed; affect is normal HEENT: Mucous membranes pink and anicteric; PERRLA; EOM intact; no cervical lymphadenopathy nor thyromegaly or carotid bruit; no JVD; Breasts:: Not examined CHEST WALL: She has bilateral lower chest wall tenderness with palpation. CHEST: Normal respiration, clear to auscultation bilaterally HEART: Regular rate and rhythm; no murmurs rubs or gallops BACK: No kyphosis or scoliosis; no CVA tenderness ABDOMEN: Obese, soft non-tender; no masses, no organomegaly, normal abdominal bowel sounds; no pannus; no intertriginous candida. Rectal Exam: Not done EXTREMITIES: No bone or joint deformity; age-appropriate arthropathy of the hands and knees; no edema; no ulcerations. Genitalia: not examined PULSES: 2+ and symmetric SKIN: Normal hydration no rash or ulceration CNS: Cranial nerves 2-12 grossly intact no focal neurologic deficit   Labs & Imaging Results for orders placed during the hospital encounter of 12/03/11 (from the past 48 hour(s))  PRO B NATRIURETIC PEPTIDE     Status: Normal   Collection Time   12/03/11 11:58 PM      Component Value Range Comment   Pro B Natriuretic peptide (BNP) 135.1  0 - 450 (pg/mL)   CBC     Status: Abnormal   Collection Time   12/03/11 11:58 PM      Component Value Range Comment   WBC 25.6 (*) 4.0 - 10.5 (K/uL)    RBC 3.75 (*) 3.87 - 5.11 (MIL/uL)     Hemoglobin 11.4 (*) 12.0 - 15.0 (g/dL)    HCT 16.1 (*) 09.6 - 46.0 (%)    MCV 91.7  78.0 - 100.0 (fL)    MCH 30.4  26.0 - 34.0 (pg)    MCHC 33.1  30.0 - 36.0 (g/dL)    RDW 04.5  40.9 - 81.1 (%)    Platelets 482 (*) 150 - 400 (K/uL)   PROTIME-INR     Status: Normal   Collection Time   12/03/11 11:58 PM      Component Value Range Comment   Prothrombin Time 14.4  11.6 - 15.2 (seconds)    INR 1.10  0.00 - 1.49    POCT I-STAT TROPONIN I     Status: Normal   Collection Time   12/04/11 12:05 AM      Component Value Range Comment   Troponin i, poc 0.00  0.00 - 0.08 (ng/mL)    Comment 3            POCT I-STAT, CHEM 8     Status: Abnormal   Collection Time   12/04/11 12:08 AM      Component Value Range Comment   Sodium 135  135 - 145 (mEq/L)    Potassium 4.2  3.5 - 5.1 (mEq/L)    Chloride 100  96 - 112 (mEq/L)    BUN 44 (*) 6 - 23 (mg/dL)    Creatinine, Ser 1.61 (*) 0.50 - 1.10 (mg/dL)    Glucose, Bld 096 (*) 70 - 99 (mg/dL)    Calcium, Ion 0.45  1.12 - 1.32 (mmol/L)    TCO2 31  0 - 100 (mmol/L)    Hemoglobin 12.9  12.0 - 15.0 (g/dL)    HCT 40.9  81.1 - 91.4 (%)   URINALYSIS, ROUTINE W REFLEX MICROSCOPIC     Status: Abnormal   Collection Time   12/04/11 12:59 AM      Component Value Range Comment   Color, Urine YELLOW  YELLOW     APPearance CLOUDY (*) CLEAR     Specific Gravity, Urine 1.019  1.005 - 1.030     pH 5.5  5.0 - 8.0     Glucose, UA NEGATIVE  NEGATIVE (mg/dL)    Hgb urine dipstick TRACE (*) NEGATIVE     Bilirubin Urine NEGATIVE  NEGATIVE     Ketones, ur NEGATIVE  NEGATIVE (mg/dL)    Protein, ur NEGATIVE  NEGATIVE (mg/dL)    Urobilinogen, UA 1.0  0.0 - 1.0 (mg/dL)    Nitrite NEGATIVE  NEGATIVE     Leukocytes, UA SMALL (*) NEGATIVE    URINE MICROSCOPIC-ADD ON     Status: Abnormal   Collection Time   12/04/11 12:59 AM      Component Value Range Comment   Squamous Epithelial / LPF MANY (*) RARE     WBC, UA 0-2  <3 (WBC/hpf)    RBC / HPF 0-2  <3 (RBC/hpf)    Bacteria, UA  MANY (*) RARE     Urine-Other MUCOUS PRESENT      Dg Chest 2 View  12/03/2011  *RADIOLOGY REPORT*  Clinical Data: Shortness of breath and weakness.  Bilateral leg swelling.  CHEST - 2 VIEW  Comparison: 09/29/2011.  Findings: Poor inspiration.  No gross change in mild enlargement of the cardiac silhouette.  Decreased prominence of the pulmonary vasculature and interstitial markings.  Lower thoracic spine degenerative changes.  IMPRESSION: Cardiomegaly with decreased pulmonary vascular congestion and improved interstitial pulmonary edema.  Original Report Authenticated By: Darrol Angel, M.D.      Assessment Present on Admission:  .Acute on chronic renal failure .Atypical chest pain .URI (upper respiratory infection) .Uterine cancer .Morbid obesity .Leukocytosis .Diabetes mellitus, diet controlled .Chronic kidney disease, stage II (mild) .Pulmonary embolism, Oct 2012 .Lymphedema .Hypertension   PLAN:  This patient has increased risk for another PE, but I do not think she has one. I think both of her lower rib tenderness is because of her cough. There is tenderness to palpation there bilaterally. I think it is important to get a  ventilation perfusion scan, but due to her recent history of vaginal bleeding, will not anticoagulate her fully. She did have hysterectomy with BSO, which may preclude further vaginal bleeding. I will continue her medications. She is stable, full code, and will be admitted to [facility] hospitalist service. She was started on antibiotic, and I  will continue her on vancomycin and Zosyn.   Other plans as per orders.    Zorana Brockwell 12/04/2011, 3:35 AM

## 2011-12-04 NOTE — ED Notes (Signed)
Patient transported to MRI 

## 2011-12-04 NOTE — Progress Notes (Signed)
Patient seen and examined in ED, admitted by Dr. Conley Rolls today AM.  - Patient currently comfortable, no acute needs - Followup on VQ scan, renal function improving this morning - UA cloudy with many bacteria and leukocytosis, agree with continuing antibiotics, follow urine culture   Manson Luckadoo M.D. [facility] 12/04/2011, 8:58 AM  Pager: [phone]

## 2011-12-04 NOTE — ED Notes (Signed)
Pt urine test have been done at 12:59am and result at 1:28am informed the nurse of this.

## 2011-12-04 NOTE — ED Notes (Signed)
Patient transported to X-ray  Nuc

## 2011-12-04 NOTE — BH Assessment (Signed)
Pt has urine test completed at 1:28 and was collect 12:59am per nurse

## 2011-12-04 NOTE — Progress Notes (Signed)
ANTIBIOTIC CONSULT NOTE - INITIAL  Pharmacy Consult for Vancomycin/Zosyn Indication: rule out pneumonia  No Known Allergies  Patient Measurements: Weight: 317 lb (143.79 kg)   Vital Signs: Temp: 98.8 F (37.1 C) (02/18 2250) Temp src: Oral (02/18 2250) BP: 137/58 mmHg (02/18 2250) Pulse Rate: 99  (02/18 2250) Intake/Output from previous day:   Intake/Output from this shift:    Labs:  Basename 12/04/11 0008 12/03/11 2358  WBC -- 25.6*  HGB 12.9 11.4*  PLT -- 482*  LABCREA -- --  CREATININE 2.00* --   The CrCl is unknown because both a height and weight (above a minimum accepted value) are required for this calculation. No results found for this basename: VANCOTROUGH:2,VANCOPEAK:2,VANCORANDOM:2,GENTTROUGH:2,GENTPEAK:2,GENTRANDOM:2,TOBRATROUGH:2,TOBRAPEAK:2,TOBRARND:2,AMIKACINPEAK:2,AMIKACINTROU:2,AMIKACIN:2, in the last 72 hours   Microbiology: No results found for this or any previous visit (from the past 720 hour(s)).  Medical History: Past Medical History  Diagnosis Date  . GERD (gastroesophageal reflux disease)   . Hypertension   . Diabetes mellitus     diet controlled  . Morbid obesity   . Arthritis   . Chronic kidney disease     nephrolithiasis left kidney,s/p open resection  . Status post arthroscopic surgery of right knee   . Diverticulosis of sigmoid colon   . Chronic back pain   . Pneumonia 07/2011    hx  . Pulmonary embolism 07/2011    hx  . Uterine cancer     endometrial high grade adenocarcinoma  . Uterus cancer, sarcoma     high grade invasive carcinosarcoma    Medications:  Scheduled:    . amLODipine  10 mg Oral Daily  . ezetimibe  10 mg Oral Daily  . furosemide  40 mg Oral BID  . heparin  5,000 Units Subcutaneous Q8H  . metoprolol succinate  25 mg Oral Daily  . nystatin   Topical QID  . piperacillin-tazobactam (ZOSYN)  IV  3.375 g Intravenous Once  . senna  1 tablet Oral Daily  . sodium chloride  3 mL Intravenous Q12H  .  vancomycin  1,000 mg Intravenous Once  . DISCONTD: nystatin  1 g Topical QID   Infusions:   Assessment: 78 yo female admitted with SOB, elevated WBCs, productive cough- treating pulmonary infection although no obvious PNA on chest X-ray.  Goal of Therapy:  Vancomycin trough level 15-20 mcg/ml  Plan:   Zosyn 3.375 Gm IV q8h. EI infusion CrCl~34 ml/min (CG)  Vancomycin 1Gm x1 now = 2Gm then 2Gm IV q48h.  F/U SCr/Levels as needed.   Susanne Greenhouse R 12/04/2011,2:54 AM

## 2011-12-05 ENCOUNTER — Inpatient Hospital Stay (HOSPITAL_COMMUNITY): Payer: Medicare Other

## 2011-12-05 LAB — URINE CULTURE

## 2011-12-05 LAB — CBC
HCT: 32 % — ABNORMAL LOW (ref 36.0–46.0)
MCH: 29.3 pg (ref 26.0–34.0)
MCHC: 31.9 g/dL (ref 30.0–36.0)
MCV: 92 fL (ref 78.0–100.0)
RDW: 15.4 % (ref 11.5–15.5)

## 2011-12-05 LAB — BASIC METABOLIC PANEL
BUN: 38 mg/dL — ABNORMAL HIGH (ref 6–23)
Calcium: 9.4 mg/dL (ref 8.4–10.5)
Creatinine, Ser: 1.97 mg/dL — ABNORMAL HIGH (ref 0.50–1.10)
GFR calc Af Amer: 27 mL/min — ABNORMAL LOW (ref >90)
GFR calc non Af Amer: 23 mL/min — ABNORMAL LOW (ref >90)

## 2011-12-05 MED ORDER — BISACODYL 10 MG RE SUPP: 10.0000 mg | Freq: Once | RECTAL | Status: DC

## 2011-12-05 MED ORDER — FLEET ENEMA 7-19 GM/118ML RE ENEM
1.0000 | ENEMA | Freq: Once | RECTAL | Status: AC
  Administered 2011-12-05: 1 via RECTAL
  Filled 2011-12-05: qty 1

## 2011-12-05 NOTE — Progress Notes (Signed)
UR completed 

## 2011-12-05 NOTE — Progress Notes (Signed)
Subjective: "i'm feeling much better since i went to bathroom". Sitting up in bed eating lunch. Denies pain/discomfort.   Objective: Vital signs Filed Vitals:   12/04/11 0742 12/04/11 1515 12/04/11 2114 12/05/11 0528  BP: 124/53 118/47 112/66 108/67  Pulse: 80 86 86 80  Temp: 97.9 F (36.6 C)  99.1 F (37.3 C) 98.4 F (36.9 C)  TempSrc: Oral  Oral Oral  Resp: 18 20 20 20   Height:   5\' 6"  (1.676 m)   Weight:   141.1 kg (311 lb 1.1 oz) 139.9 kg (308 lb 6.8 oz)  SpO2: 100% 100% 100% 100%   Weight change: -2.69 kg (-5 lb 14.9 oz) Last BM Date: 12/03/11  Intake/Output from previous day: 02/19 0701 - 02/20 0700 In: 8854 [P.O.:8701; I.V.:3; IV Piggyback:150] Out: 2050 [Urine:2050] Total I/O In: -  Out: 200 [Urine:200]   Physical Exam: General: Alert, awake, oriented x3, in no acute distress. HEENT: No bruits, no goiter. PERRL, Mucus membrane moist/pink Heart: Regular rate and rhythm, without murmurs, rubs, gallops. Some mild tenderness to bilateral lower ribs Lungs:  Normal effort. Breath sounds clear to auscultation bilaterally. No wheeze, rhonchi Abdomen: Morbidly obese,  Soft, nontender, nondistended, positive bowel sounds. Extremities: No clubbing cyanosis  with positive pedal pulses. Neuro: Grossly intact, nonfocal.    Lab Results: Basic Metabolic Panel:  Basename 12/05/11 0930 12/04/11 0429  NA 135 133*  K 3.9 4.2  CL 95* 94*  CO2 30 26  GLUCOSE 96 113*  BUN 38* 40*  CREATININE 1.97* 1.89*  CALCIUM 9.4 9.4  MG -- --  PHOS -- --   Liver Function Tests: No results found for this basename: AST:2,ALT:2,ALKPHOS:2,BILITOT:2,PROT:2,ALBUMIN:2 in the last 72 hours No results found for this basename: LIPASE:2,AMYLASE:2 in the last 72 hours No results found for this basename: AMMONIA:2 in the last 72 hours CBC:  Basename 12/05/11 0930 12/04/11 0429  WBC 19.1* 20.5*  NEUTROABS -- --  HGB 10.2* 10.8*  HCT 32.0* 33.8*  MCV 92.0 91.8  PLT 485* 463*   Cardiac  Enzymes: No results found for this basename: CKTOTAL:3,CKMB:3,CKMBINDEX:3,TROPONINI:3 in the last 72 hours BNP:  Basename 12/03/11 2358  PROBNP 135.1   D-Dimer: No results found for this basename: DDIMER:2 in the last 72 hours CBG: No results found for this basename: GLUCAP:6 in the last 72 hours Hemoglobin A1C: No results found for this basename: HGBA1C in the last 72 hours Fasting Lipid Panel: No results found for this basename: CHOL,HDL,LDLCALC,TRIG,CHOLHDL,LDLDIRECT in the last 72 hours Thyroid Function Tests: No results found for this basename: TSH,T4TOTAL,FREET4,T3FREE,THYROIDAB in the last 72 hours Anemia Panel: No results found for this basename: VITAMINB12,FOLATE,FERRITIN,TIBC,IRON,RETICCTPCT in the last 72 hours Coagulation:  Basename 12/03/11 2358  LABPROT 14.4  INR 1.10   Urine Drug Screen: Drugs of Abuse  No results found for this basename: labopia, cocainscrnur, labbenz, amphetmu, thcu, labbarb    Alcohol Level: No results found for this basename: ETH:2 in the last 72 hours Urinalysis:  Basename 12/04/11 0059  COLORURINE YELLOW  LABSPEC 1.019  PHURINE 5.5  GLUCOSEU NEGATIVE  HGBUR TRACE*  BILIRUBINUR NEGATIVE  KETONESUR NEGATIVE  PROTEINUR NEGATIVE  UROBILINOGEN 1.0  NITRITE NEGATIVE  LEUKOCYTESUR SMALL*   Misc. Labs:  Recent Results (from the past 240 hour(s))  URINE CULTURE     Status: Normal   Collection Time   12/04/11 11:58 AM      Component Value Range Status Comment   Specimen Description URINE, RANDOM   Final    Special Requests NONE  Final    Culture  Setup Time [id number]   Final    Colony Count 75,000 COLONIES/ML   Final    Culture     Final    Value: DIPHTHEROIDS(CORYNEBACTERIUM SPECIES)     Note: Standardized susceptibility testing for this organism is not available.   Report Status 12/05/2011 FINAL   Final     Studies/Results: Dg Chest 2 View  12/03/2011  *RADIOLOGY REPORT*  Clinical Data: Shortness of breath and weakness.   Bilateral leg swelling.  CHEST - 2 VIEW  Comparison: 09/29/2011.  Findings: Poor inspiration.  No gross change in mild enlargement of the cardiac silhouette.  Decreased prominence of the pulmonary vasculature and interstitial markings.  Lower thoracic spine degenerative changes.  IMPRESSION: Cardiomegaly with decreased pulmonary vascular congestion and improved interstitial pulmonary edema.  Original Report Authenticated By: Darrol Angel, M.D.   Dg Ribs Bilateral  12/05/2011  *RADIOLOGY REPORT*  Clinical Data: History of pain in the rib area.  No history injury. Pain in the anterior rib area bilaterally.  BILATERAL RIBS - 3+ VIEW  Comparison: 09/29/2011.  Findings: There is moderate enlargement of the cardiac silhouette. Ectasia and nonaneurysmal calcification of the thoracic aorta are seen. There is mild vascular congestion pattern.  There is moderate sized right pleural effusion with associated right basilar atelectasis. No pneumothorax is seen.  There is osteopenic appearance of the bones.  No rib fracture or rib destruction is evident.  Surgical staples are seen in the upper abdomen.  Inferior vena cava Greenfield filter is in place.  IMPRESSION: Moderate cardiac silhouette enlargement with vascular congestion pattern.  Moderate sized right pleural effusion with associated right basilar atelectasis.  This most likely reflects element of volume overload and / or congestive heart failure.  No pneumothorax is seen.  No rib fracture or destruction is evident.  There is osteopenic appearance of bones.  Original Report Authenticated By: Crawford Givens, M.D.   Nm Pulmonary Per & Vent  12/04/2011  *RADIOLOGY REPORT*  Clinical Data: Chest pain, shortness of breath  NM PULMONARY VENTILATION AND PERFUSION SCAN  Radiopharmaceutical: 3.0 mCi technetium 48m MAA and 11.0 mCi of xenon 133  Comparison: Chest x-ray of 12/03/2011  Findings: On the perfusion phase, no significant perfusion defect is seen. On the ventilation  phase there is normal wash-in of the radionuclide.  On the wash out phase only minimal gas trapping is present. This nuclear medicine ventilation perfusion lung scan is considered low probability for pulmonary embolism.  IMPRESSION: Low probability of pulmonary embolism.  Original Report Authenticated By: Juline Patch, M.D.   Dg Abd 2 Views  12/05/2011  *RADIOLOGY REPORT*  Clinical Data: History of abdominal pain.  History of constipation.  ABDOMEN - 2 VIEW  Comparison: None.  Findings: There is a moderate amount of fecal material within the colon predominantly within the distal transverse colon and splenic flexure portions.  No impaction is seen. Small bowel gas pattern is normal.  Surgical staples are seen in the left upper quadrant of the abdomen.  A Greenfield inferior vena cava filter is seen in place at the level of L2-L3.  No pneumoperitoneum is evident. Pelvic phleboliths are present.  Degenerative spondylosis compatible with age is seen.  There is enlargement of the cardiac silhouette.  There is right pleural effusion of moderate size with associated right basilar atelectasis.  IMPRESSION: Moderate amount of fecal material within the colon predominantly within the distal transverse colon and splenic flexure portions. No impaction is seen. No pneumoperitoneum.  Cardiac silhouette enlargement.  Right pleural effusion with associated right basilar atelectasis.  Original Report Authenticated By: Crawford Givens, M.D.    Medications: Scheduled Meds:   . amLODipine  10 mg Oral Daily  . bisacodyl  10 mg Rectal Once  . ezetimibe  10 mg Oral Daily  . furosemide  40 mg Oral BID  . heparin  5,000 Units Subcutaneous Q8H  . metoprolol succinate  25 mg Oral Daily  . nystatin   Topical QID  . piperacillin-tazobactam (ZOSYN)  IV  3.375 g Intravenous Q8H  . senna  1 tablet Oral Daily  . sodium chloride  3 mL Intravenous Q12H  . sodium phosphate  1 enema Rectal Once  . vancomycin  2,000 mg Intravenous Q48H    Continuous Infusions:  PRN Meds:.sodium chloride, HYDROcodone-acetaminophen, metoCLOPramide, sodium chloride, zolpidem  Assessment/Plan:  Principal Problem:  *Atypical chest pain Active Problems:  Uterine cancer  Pulmonary embolism, Oct 2012  Chronic kidney disease, stage II (mild)  Hypertension  Morbid obesity  Leukocytosis  Lymphedema  Diabetes mellitus, diet controlled  Acute on chronic renal failure  URI (upper respiratory infection)  1. Chest Pain - atypical. Likely related to vigorous coughing from possible URI and or constipation. Tele with SR. EKG no acute ST changes. VQ scan low probability for PE. Pain worse with movement. Abdominal xray yields moderate amount stool. Will give fleets, dulcolax.   Will continue to monitor.  2. Upper respiratory infection: hx cough with thick yellow sputum. Vanc and Zosyn day#2. Will get sputum culture. Chest xray with cardiomegaly and improved vascular edema. Improved. Will provide cough med as needed. Sats 100% \3. Leukocytosis likely related to #2.  on room air. Slight improvement. Continue antibiotics and monitor. Afebrile, VSS non toxic appearing. Will monitor. 4.Chronic kidney failure stage II: creatinine at baseline 5. DM: diet controlled Will check HGA1c CBG 96 6. HTN: controlled. Continue home meds     LOS: 2 days   [facility] M 12/05/2011, 1:31 PM  I have personally examined the patient and reviewed the entire database. Agree with the above note and plan as outlined by Ms. Black, NP. Agree with continuing IV antibiotics until cultures negative, patient needs aggressive bowel regimen likely causing her atypical chest pain. Rib x-rays and abdominal x-rays reviewed.  Vinessa Macconnell M.D. [facility] 12/05/2011, 3:20 PM

## 2011-12-06 ENCOUNTER — Telehealth: Payer: Self-pay | Admitting: *Deleted

## 2011-12-06 LAB — BASIC METABOLIC PANEL
Calcium: 8.9 mg/dL (ref 8.4–10.5)
GFR calc Af Amer: 21 mL/min — ABNORMAL LOW (ref >90)
GFR calc non Af Amer: 18 mL/min — ABNORMAL LOW (ref >90)
Potassium: 4.1 mEq/L (ref 3.5–5.1)
Sodium: 133 mEq/L — ABNORMAL LOW (ref 135–145)

## 2011-12-06 LAB — CBC
MCHC: 31.4 g/dL (ref 30.0–36.0)
RDW: 15.3 % (ref 11.5–15.5)

## 2011-12-06 MED ORDER — FUROSEMIDE 40 MG PO TABS
40.0000 mg | ORAL_TABLET | Freq: Every day | ORAL | Status: DC
  Administered 2011-12-06 – 2011-12-10 (×5): 40 mg via ORAL
  Filled 2011-12-06 (×6): qty 1

## 2011-12-06 MED ORDER — LEVOFLOXACIN 750 MG PO TABS
750.0000 mg | ORAL_TABLET | ORAL | Status: DC
  Administered 2011-12-06 – 2011-12-10 (×3): 750 mg via ORAL
  Filled 2011-12-06 (×3): qty 1

## 2011-12-06 NOTE — Progress Notes (Signed)
PATIENT DETAILS Name: Christine [name] Age: 78 y.o. Sex: female Date of Birth: 01/26/1931 Admit Date: 12/03/2011 UJW:JXBJYNW,GNFAOZHY R., MD, MD POA:   CONSULTS: None  Subjective: Still with pain in right side with deep inspiration. Denies any sob. Large BM yesterday. Pt states she is planning on d/c home where she lives with son. Prior to this admission she was at [facility] for short-term rehab.   Objective: Vital signs in last 24 hours: Temp:  [98.3 F (36.8 C)-98.8 F (37.1 C)] 98.4 F (36.9 C) (02/21 0527) Pulse Rate:  [77-86] 86  (02/21 0527) Resp:  [16-19] 19  (02/21 0527) BP: (96-117)/(60-64) 117/64 mmHg (02/21 0527) SpO2:  [100 %] 100 % (02/21 0527) Weight change:  Last BM Date: 12/05/11  Intake/Output from previous day:  Intake/Output Summary (Last 24 hours) at 12/06/11 0852 Last data filed at 12/06/11 0552  Gross per 24 hour  Intake    390 ml  Output    400 ml  Net    -10 ml     Physical Exam:  Gen:  Awake, alert, eating in NAd Cardiovascular:  S1S2 RRR, no m/r/g Respiratory: Decreased effort d/t pain but CTAB, no w/r/c, on increased wob Gastrointestinal: Abdomen obese, soft, NT, ND, BS+ Extremities: chronic lymphedema stable Skin: no suspicious rashes or lesions   Lab Results:  Lab 12/06/11 0445 12/05/11 0930 12/04/11 0429  HGB 9.5* 10.2* 10.8*  HCT 30.3* 32.0* 33.8*  WBC 15.5* 19.1* 20.5*  PLT 424* 485* 463*     Lab 12/06/11 0445 12/05/11 0930 12/04/11 0429 12/04/11 0008  NA 133* 135 133* 135  K 4.1 3.9 -- --  CL 94* 95* 94* 100  CO2 29 30 26  --  GLUCOSE 85 96 113* 139*  BUN 45* 38* 40* 44*  CREATININE 2.38* 1.97* 1.89* 2.00*  CALCIUM 8.9 9.4 9.4 --  MG -- -- -- --  PHOS -- -- -- --    Studies/Results: Dg Ribs Bilateral  12/05/2011  *RADIOLOGY REPORT*  Clinical Data: History of pain in the rib area.  No history injury. Pain in the anterior rib area bilaterally.  BILATERAL RIBS - 3+ VIEW  Comparison: 09/29/2011.  Findings: There is  moderate enlargement of the cardiac silhouette. Ectasia and nonaneurysmal calcification of the thoracic aorta are seen. There is mild vascular congestion pattern.  There is moderate sized right pleural effusion with associated right basilar atelectasis. No pneumothorax is seen.  There is osteopenic appearance of the bones.  No rib fracture or rib destruction is evident.  Surgical staples are seen in the upper abdomen.  Inferior vena cava Greenfield filter is in place.  IMPRESSION: Moderate cardiac silhouette enlargement with vascular congestion pattern.  Moderate sized right pleural effusion with associated right basilar atelectasis.  This most likely reflects element of volume overload and / or congestive heart failure.  No pneumothorax is seen.  No rib fracture or destruction is evident.  There is osteopenic appearance of bones.  Original Report Authenticated By: Crawford Givens, M.D.   Nm Pulmonary Per & Vent  12/04/2011  *RADIOLOGY REPORT*  Clinical Data: Chest pain, shortness of breath  NM PULMONARY VENTILATION AND PERFUSION SCAN  Radiopharmaceutical: 3.0 mCi technetium 99m MAA and 11.0 mCi of xenon 133  Comparison: Chest x-ray of 12/03/2011  Findings: On the perfusion phase, no significant perfusion defect is seen. On the ventilation phase there is normal wash-in of the radionuclide.  On the wash out phase only minimal gas trapping is present. This nuclear medicine ventilation perfusion lung  scan is considered low probability for pulmonary embolism.  IMPRESSION: Low probability of pulmonary embolism.  Original Report Authenticated By: Juline Patch, M.D.   Dg Abd 2 Views  12/05/2011  *RADIOLOGY REPORT*  Clinical Data: History of abdominal pain.  History of constipation.  ABDOMEN - 2 VIEW  Comparison: None.  Findings: There is a moderate amount of fecal material within the colon predominantly within the distal transverse colon and splenic flexure portions.  No impaction is seen. Small bowel gas pattern is  normal.  Surgical staples are seen in the left upper quadrant of the abdomen.  A Greenfield inferior vena cava filter is seen in place at the level of L2-L3.  No pneumoperitoneum is evident. Pelvic phleboliths are present.  Degenerative spondylosis compatible with age is seen.  There is enlargement of the cardiac silhouette.  There is right pleural effusion of moderate size with associated right basilar atelectasis.  IMPRESSION: Moderate amount of fecal material within the colon predominantly within the distal transverse colon and splenic flexure portions. No impaction is seen. No pneumoperitoneum.  Cardiac silhouette enlargement.  Right pleural effusion with associated right basilar atelectasis.  Original Report Authenticated By: Crawford Givens, M.D.    Medications: Scheduled Meds:   . amLODipine  10 mg Oral Daily  . bisacodyl  10 mg Rectal Once  . ezetimibe  10 mg Oral Daily  . furosemide  40 mg Oral BID  . heparin  5,000 Units Subcutaneous Q8H  . metoprolol succinate  25 mg Oral Daily  . nystatin   Topical QID  . piperacillin-tazobactam (ZOSYN)  IV  3.375 g Intravenous Q8H  . senna  1 tablet Oral Daily  . sodium chloride  3 mL Intravenous Q12H  . sodium phosphate  1 enema Rectal Once  . vancomycin  2,000 mg Intravenous Q48H   Continuous Infusions:  PRN Meds:.sodium chloride, HYDROcodone-acetaminophen, metoCLOPramide, sodium chloride, zolpidem Antibiotics: Anti-infectives     Start     Dose/Rate Route Frequency Ordered Stop   12/06/11 0200   vancomycin (VANCOCIN) 2,000 mg in sodium chloride 0.9 % 500 mL IVPB        2,000 mg 250 mL/hr over 120 Minutes Intravenous Every 48 hours 12/04/11 0306     12/04/11 1000  piperacillin-tazobactam (ZOSYN) IVPB 3.375 g       3.375 g 12.5 mL/hr over 240 Minutes Intravenous Every 8 hours 12/04/11 0307     12/04/11 0330   vancomycin (VANCOCIN) IVPB 1000 mg/200 mL premix        1,000 mg 200 mL/hr over 60 Minutes Intravenous  Once 12/04/11 0305 12/04/11  0637   12/04/11 0045   vancomycin (VANCOCIN) IVPB 1000 mg/200 mL premix        1,000 mg 200 mL/hr over 60 Minutes Intravenous  Once 12/04/11 0022 12/04/11 0312   12/04/11 0045  piperacillin-tazobactam (ZOSYN) 3.375 g in dextrose 5 % 50 mL IVPB       3.375 g 100 mL/hr over 30 Minutes Intravenous  Once 12/04/11 0022 12/04/11 0153           Assessment/Plan:  1. Atypical chest pain: ?costocondritis secondary to cough vs msk r/t cough. Pain persists despite resolution of constipation. Cardiac enzymes are negative. VQ scan in low probability PE. Would be hesitant to prescribe any NSAIDS given pt's age and CKD.  Will continue prn hydrocodone for now. PT/OT to eval  2. URI: on empiric Vancomycin and Zosyn D3. Pt has remained afebrile with WBC trending down. Will change to po  Levaquin. Continue to monitor trend.  3. Leukocytosis: No evidence of pna on CXR. Urine culture negative. Pt has remained afebrile. Will continue Levaquin for URI.   4. CKD, stage II: Increases Cr today. Will decrease Lasix dose. Continue to monitor  5. DM2: BS stable. No meds pta. Will add SSI to monitor CBGs. Check A1C with am labs.   6. Hx htn: currently stable. Continue home BB, norvasc  7. Morbid Obesity  8. Lymphedema, chronic: stable  9. Hx of PE (07/2011): no longer on anticoag. Continue dvt proph  10. Prophylaxis: on sq heparin.  11. Dispo: will have PT/OT eval for d/c planning. Pt wants to go home where she lives with her son. May need SNF if agreeable.  Cordelia Pen, NP-C [facility]  pgr [phone]    LOS: 3 days    12/06/2011, 8:52 AM

## 2011-12-06 NOTE — Telephone Encounter (Signed)
Message left for patient with pap smear results and the recommendation for follow up in 6 months per Dr. Duard Brady.  Pt instructed to call in June 2013 for an appointment in August 2013.

## 2011-12-06 NOTE — Progress Notes (Signed)
ANTIBIOTIC CONSULT NOTE - INITIAL  Pharmacy Consult for Levaquin Indication: URI  No Known Allergies  Patient Measurements: Height: 5\' 6"  (167.6 cm) Weight: 308 lb 6.8 oz (139.9 kg) IBW/kg (Calculated) : 59.3   Vital Signs: Temp: 98.4 F (36.9 C) (02/21 0527) Temp src: Oral (02/21 0527) BP: 117/64 mmHg (02/21 0527) Pulse Rate: 86  (02/21 0527) Intake/Output from previous day: 02/20 0701 - 02/21 0700 In: 390 [P.O.:240; IV Piggyback:150] Out: 400 [Urine:400] Intake/Output from this shift: Total I/O In: 320 [P.O.:320] Out: -   Labs:  Basename 12/06/11 0445 12/05/11 0930 12/04/11 0429  WBC 15.5* 19.1* 20.5*  HGB 9.5* 10.2* 10.8*  PLT 424* 485* 463*  LABCREA -- -- --  CREATININE 2.38* 1.97* 1.89*   Estimated Creatinine Clearance: 28.1 ml/min (by C-G formula based on Cr of 2.38). No results found for this basename: VANCOTROUGH:2,VANCOPEAK:2,VANCORANDOM:2,GENTTROUGH:2,GENTPEAK:2,GENTRANDOM:2,TOBRATROUGH:2,TOBRAPEAK:2,TOBRARND:2,AMIKACINPEAK:2,AMIKACINTROU:2,AMIKACIN:2, in the last 72 hours   Assessment:  80 YOF on Day#3 of Vanc and Zosyn. Pt remains afebrile and WBC trending down.  Received new orders to d/c Vanc/Zosyn and start Levaquin  ARF with Scr of 2.38, CrCl 28 ml/min.  Plan:   Levaquin 750 mg po q48 hours  F/u renal fxn and adjust as needed.  Geoffry Paradise Thi 12/06/2011,4:50 PM

## 2011-12-06 NOTE — Progress Notes (Signed)
12-06-11 Left vm for Leonette Most at [phone] to request call back regarding dc plans. [place], [place] [phone]

## 2011-12-06 NOTE — Progress Notes (Signed)
The patient was seen and examined, discussed with NP. Agree with the above assessment and plan

## 2011-12-06 NOTE — Progress Notes (Signed)
12-06-11 Spoke with Christine [name] at bedside. She was recently at [facility] but reports she plans to return home with her son. PCP: Dr. Andi Devon. Will await therapy recommendations regarding dc plans. Patient states her son Christine [name] [phone] has been "working on something" regarding home health or dc plans. States she has had [facility] in the recent past. Asked that this CM call Christine [name] (son) to find out details regarding dc plans. Will cont to follow.  [place], [place] [phone]

## 2011-12-06 NOTE — Clinical Documentation Improvement (Signed)
BMI DOCUMENTATION CLARIFICATION QUERY  THIS DOCUMENT IS NOT A PERMANENT PART OF THE MEDICAL RECORD  TO RESPOND TO THE THIS QUERY, FOLLOW THE INSTRUCTIONS BELOW:  1. If needed, update documentation for the patient's encounter via the notes activity.  2. Access this query again and click edit on the In [employer].  3. After updating, or not, click F2 to complete all highlighted (required) fields concerning your review. Select "additional documentation in the medical record" OR "no additional documentation provided".  4. Click Sign note button.  5. The deficiency will fall out of your In Basket *Please let us know if you are not able to complete this workflow by phone or e-mail (listed below).         12/06/11  Dear Dr. [facility]  In an effort to better capture your patient's severity of illness, reflect appropriate length of stay and utilization of resources, a review of the patient medical record has revealed the following indicators.    Based on your clinical judgment, please clarify and document in a progress note and/or discharge summary the clinical condition associated with the following supporting information:  In responding to this query please exercise your independent judgment.  The fact that a query is asked, does not imply that any particular answer is desired or expected.  12/06/11 Prog note..." Morbid Obesity" and per Vital Signs flowsheet, BMI=51.3. For accurate Dx specificity & severity please help link noted condition with calulated BMI if possible. Thank you   Possible Clinical conditions: Morbid Obesity W/ BMI= 51.3  Underweight w/BMI=  Other condition___________________  Cannot Clinically determine _____________  Clinical Information:  Risk Factors: 12/06/11 Prog note..." Morbid Obesity".  Signs & Symptoms: Weight: 143.79 kg  Height: 167.6 cm  BMI = 51.3  Diagnostics:  Rout daily labs  Treatment Carb Mod diet  Nursing  Note:   Reviewed:  no additional documentation provided: 12/10/11: dc summ rev'd, no add doc noted.ORM  Thank You,  Toribio Harbour, RN, BSN, CCDS Certified Clinical Documentation Specialist Pager: [phone]  Health Information Management [place]

## 2011-12-07 LAB — CBC
Hemoglobin: 9.1 g/dL — ABNORMAL LOW (ref 12.0–15.0)
MCHC: 31.8 g/dL (ref 30.0–36.0)
Platelets: 413 10*3/uL — ABNORMAL HIGH (ref 150–400)
RBC: 3.12 MIL/uL — ABNORMAL LOW (ref 3.87–5.11)

## 2011-12-07 LAB — BASIC METABOLIC PANEL
CO2: 30 mEq/L (ref 19–32)
GFR calc non Af Amer: 18 mL/min — ABNORMAL LOW (ref >90)
Glucose, Bld: 87 mg/dL (ref 70–99)
Potassium: 4.4 mEq/L (ref 3.5–5.1)
Sodium: 132 mEq/L — ABNORMAL LOW (ref 135–145)

## 2011-12-07 LAB — VANCOMYCIN, RANDOM: Vancomycin Rm: 22.3 ug/mL

## 2011-12-07 LAB — HEMOGLOBIN A1C
Hgb A1c MFr Bld: 5.8 % — ABNORMAL HIGH (ref <5.7)
Mean Plasma Glucose: 120 mg/dL — ABNORMAL HIGH (ref <117)

## 2011-12-07 MED ORDER — METOCLOPRAMIDE HCL 10 MG PO TABS
5.0000 mg | ORAL_TABLET | Freq: Four times a day (QID) | ORAL | Status: DC | PRN
  Administered 2011-12-08 (×2): 5 mg via ORAL
  Filled 2011-12-07 (×2): qty 1

## 2011-12-07 NOTE — Progress Notes (Signed)
CSW spoke with patient's son, Manami Tutor (cell#: [phone]) re: discharge planning. Patient had been to [facility] in the past but would like to see what other SNF options they have. CSW completed FL2 and faxed information out to [facility], will follow-up with bed offers when available.   Unice Bailey, LCSWA [phone]

## 2011-12-07 NOTE — Progress Notes (Signed)
12-07-11 Discussed dc plans with Christine [name] at bedside. She states she is agreeable with whatever her son, Christine [name] thinks is best. Christine [name] is agreeable to SNF placement.   [place], [place] [phone]

## 2011-12-07 NOTE — Progress Notes (Addendum)
Patient seen and examined ,has multiple nonspecific chronic complaints ,work up non revealing so far .labs reviewed ,renal function is worsening which could possibly be due to diuresis ,however patient was also on vancomycin and zosyn which were discontinued yesterday ,however I was unable to locate vancomycin level.interstitial nephritis secondary to antibiotics is also a possibility .will add on random vancomycin level .check urine urea and creatinine to calculate Fe urea (since patient is on lasix FeNa  Will not be accurate).Check urine eosinophils. Disposition to SNF vs home with HHPT/HHRN

## 2011-12-07 NOTE — Evaluation (Signed)
Physical Therapy Evaluation Patient Details Name: Christine [name] MRN: [id number] DOB: May 04, 1931 Today's Date: 12/07/2011  Problem List:  Patient Active Problem List  Diagnoses  . Uterine cancer  . Chronic back pain  . Pneumonia  . Pulmonary embolism, Oct 2012  . Chronic kidney disease, stage II (mild)  . Hypertension  . GERD (gastroesophageal reflux disease)  . Arthritis, on high dose steroids since 12/16 for toe pain  . Morbid obesity  . Leukocytosis  . Coagulopathy, supratheraputic INR  . Acute diastolic congestive heart failure, 2D Oct 2012  . Lymphedema  . Diabetes mellitus, diet controlled  . Hematuria  . Acute on chronic renal failure  . UTI (lower urinary tract infection)  . AKI (acute kidney injury)  . Atypical chest pain  . URI (upper respiratory infection)    Past Medical History:  Past Medical History  Diagnosis Date  . GERD (gastroesophageal reflux disease)   . Hypertension   . Diabetes mellitus     diet controlled  . Morbid obesity   . Arthritis   . Chronic kidney disease     nephrolithiasis left kidney,s/p open resection  . Status post arthroscopic surgery of right knee   . Diverticulosis of sigmoid colon   . Chronic back pain   . Pneumonia 07/2011    hx  . Pulmonary embolism 07/2011    hx  . Uterine cancer     endometrial high grade adenocarcinoma  . Uterus cancer, sarcoma     high grade invasive carcinosarcoma  . Myocardial infarction 1989  . Bronchitis   . H/O cardiomegaly    Past Surgical History:  Past Surgical History  Procedure Date  . Colonoscopy     negative  . Knee arthroscopy     right knee  . Cataract extraction, bilateral   . Tubal ligation 1973  . Abdominal hysterectomy 10/05/2008    total with b/l salpingo-oopherectomy  . Kidney stone removal   . Open left knee cartilage repair   . Left index finger surgery     PT Assessment/Plan/Recommendation PT Assessment Clinical Impression Statement: Pt presents with  diagnosis of atypical chest pain. Pt admitted from [facility] and does not wish to return-pt prefers to D/C home. Feel pt will need 24 hour supervision, at least initally, if she were to D/C home from hospital. Pt currently demonstrates general weakness and decreased activity tolerance during eval.  PT Recommendation/Assessment: Patient will need skilled PT in the acute care venue PT Problem List: Decreased strength;Decreased activity tolerance;Decreased mobility Barriers to Discharge: Decreased caregiver support PT Therapy Diagnosis : Difficulty walking;Generalized weakness PT Plan PT Frequency: Min 3X/week PT Treatment/Interventions: DME instruction;Gait training;Functional mobility training;Therapeutic activities;Therapeutic exercise;Patient/family education PT Recommendation Recommendations for Other Services: OT consult Follow Up Recommendations: Skilled nursing facility vs Home health PT withSupervision/Assistance - 24 hour for safety, depending on progress and assist available at home. Pt may benefit from home health aide if discharges home.  Equipment Recommended: None recommended by PT PT Goals  Acute Rehab PT Goals PT Goal Formulation: With patient Time For Goal Achievement: 7 days Pt will go Supine/Side to Sit: with modified independence PT Goal: Supine/Side to Sit - Progress: Goal set today Pt will go Sit to Supine/Side: with modified independence PT Goal: Sit to Supine/Side - Progress: Goal set today Pt will go Sit to Stand: with modified independence PT Goal: Sit to Stand - Progress: Goal set today Pt will go Stand to Sit: with modified independence Pt will Ambulate: 16 - 50  feet;with modified independence;with rolling walker PT Goal: Ambulate - Progress: Goal set today  PT Evaluation Precautions/Restrictions  Restrictions Weight Bearing Restrictions: No Prior Functioning  Home Living Lives With: Sheran Spine Help From: Family Type of Home: House Home Layout: One  level Home Access: Level entry Home Adaptive Equipment: Walker - rolling;Hospital bed;Shower chair with back Additional Comments: pt states she needs tub bench. pt also states son works during day. Prior Function Level of Independence: Needs assistance with ADLs;Needs assistance with homemaking;Requires assistive device for independence Driving: No Cognition Cognition Arousal/Alertness: Awake/alert Overall Cognitive Status: Appears within functional limits for tasks assessed Orientation Level: Oriented X4 Sensation/Coordination Sensation Light Touch: Appears Intact Coordination Gross Motor Movements are Fluid and Coordinated: Yes Extremity Assessment RLE Strength RLE Overall Strength Comments: ? lymphedema. Strength > or = 3+/5 with functional activity.  LLE Strength LLE Overall Strength Comments: ? lymphedema. Strength > or = 3+/5 with functional activity Mobility (including Balance) Bed Mobility Bed Mobility: Yes Supine to Sit: 6: Modified independent (Device/Increase time);HOB elevated (Comment degrees);With rails Transfers Transfers: Yes Sit to Stand: From elevated surface;With upper extremity assist;From bed;From chair/3-in-1 Sit to Stand Details (indicate cue type and reason): Min-guard assist. VCs safety, technique, hand placement.  Stand to Sit: To chair/3-in-1 Stand to Sit Details: Min-guard assist. VCs safety, technique, hand placement. Ambulation/Gait Ambulation/Gait: Yes Ambulation/Gait Assistance: 4: Min assist Ambulation/Gait Assistance Details (indicate cue type and reason): A to steady intermittently, especially as fatigue sets in. VCs safety, posture. Fatigues easily. Followed with recliner. Dypsnea 3/4 with ambulation on RA.  Ambulation Distance (Feet): 40 Feet (x2. Seated rest break in recliner) Assistive device: Rolling walker Gait Pattern: Step-through pattern;Decreased step length - left;Decreased step length - right;Decreased stride length;Trunk flexed    Posture/Postural Control Posture/Postural Control: No significant limitations Exercise    End of Session PT - End of Session Equipment Utilized During Treatment: Gait belt Activity Tolerance: Patient limited by fatigue Patient left: in chair;with call bell in reach General Behavior During Session: [facility] for tasks performed Cognition: [facility] for tasks performed  [name] [facility] 12/07/2011, 11:15 AM [phone]

## 2011-12-07 NOTE — Progress Notes (Signed)
Subjective: No complaints. No events.    Objective: Vital signs Filed Vitals:   12/06/11 0527 12/06/11 2204 12/07/11 0542 12/07/11 1425  BP: 117/64 93/57 121/62 92/57  Pulse: 86 83 79 80  Temp: 98.4 F (36.9 C) 98.7 F (37.1 C) 97.8 F (36.6 C) 98.7 F (37.1 C)  TempSrc: Oral Oral Oral Oral  Resp: 19 18 18 20   Height:      Weight:   141.023 kg (310 lb 14.4 oz)   SpO2: 100% 97% 100% 100%   Weight change:  Last BM Date: 12/05/11  Intake/Output from previous day: 02/21 0701 - 02/22 0700 In: 1340 [P.O.:1120; I.V.:120; IV Piggyback:100] Out: 950 [Urine:950] Total I/O In: 240 [P.O.:240] Out: 650 [Urine:650]   Physical Exam: General: Alert, awake, oriented x3, in no acute distress. HEENT: No bruits, no goiter. PERRL, EOMI Heart: Regular rate and rhythm, without murmurs, rubs, gallops. Lungs:Normal effort. Clear to auscultation bilaterally. No wheeze, rhonchi Abdomen: Obese, Soft, nontender, nondistended, positive bowel sounds. Extremities: No clubbing cyanosis with positive pedal pulses. Chronic lymphedema Neuro: Grossly intact, nonfocal. Speech clear.     Lab Results: Basic Metabolic Panel:  Basename 12/07/11 0443 12/06/11 0445  NA 132* 133*  K 4.4 4.1  CL 95* 94*  CO2 30 29  GLUCOSE 87 85  BUN 44* 45*  CREATININE 2.45* 2.38*  CALCIUM 8.8 8.9  MG -- --  PHOS -- --   Liver Function Tests: No results found for this basename: AST:2,ALT:2,ALKPHOS:2,BILITOT:2,PROT:2,ALBUMIN:2 in the last 72 hours No results found for this basename: LIPASE:2,AMYLASE:2 in the last 72 hours No results found for this basename: AMMONIA:2 in the last 72 hours CBC:  Basename 12/07/11 0443 12/06/11 0445  WBC 13.7* 15.5*  NEUTROABS -- --  HGB 9.1* 9.5*  HCT 28.6* 30.3*  MCV 91.7 92.7  PLT 413* 424*   Cardiac Enzymes: No results found for this basename: CKTOTAL:3,CKMB:3,CKMBINDEX:3,TROPONINI:3 in the last 72 hours BNP: No results found for this basename: PROBNP:3 in the last 72  hours D-Dimer: No results found for this basename: DDIMER:2 in the last 72 hours CBG: No results found for this basename: GLUCAP:6 in the last 72 hours Hemoglobin A1C:  Basename 12/07/11 0443  HGBA1C 5.8*   Fasting Lipid Panel: No results found for this basename: CHOL,HDL,LDLCALC,TRIG,CHOLHDL,LDLDIRECT in the last 72 hours Thyroid Function Tests: No results found for this basename: TSH,T4TOTAL,FREET4,T3FREE,THYROIDAB in the last 72 hours Anemia Panel: No results found for this basename: VITAMINB12,FOLATE,FERRITIN,TIBC,IRON,RETICCTPCT in the last 72 hours Coagulation: No results found for this basename: LABPROT:2,INR:2 in the last 72 hours Urine Drug Screen: Drugs of Abuse  No results found for this basename: labopia, cocainscrnur, labbenz, amphetmu, thcu, labbarb    Alcohol Level: No results found for this basename: ETH:2 in the last 72 hours Urinalysis: No results found for this basename: COLORURINE:2,APPERANCEUR:2,LABSPEC:2,PHURINE:2,GLUCOSEU:2,HGBUR:2,BILIRUBINUR:2,KETONESUR:2,PROTEINUR:2,UROBILINOGEN:2,NITRITE:2,LEUKOCYTESUR:2 in the last 72 hours Misc. Labs:  Recent Results (from the past 240 hour(s))  URINE CULTURE     Status: Normal   Collection Time   12/04/11 11:58 AM      Component Value Range Status Comment   Specimen Description URINE, RANDOM   Final    Special Requests NONE   Final    Culture  Setup Time [id number]   Final    Colony Count 75,000 COLONIES/ML   Final    Culture     Final    Value: DIPHTHEROIDS(CORYNEBACTERIUM SPECIES)     Note: Standardized susceptibility testing for this organism is not available.   Report Status 12/05/2011 FINAL  Final     Studies/Results: No results found.  Medications: Scheduled Meds:   . amLODipine  10 mg Oral Daily  . bisacodyl  10 mg Rectal Once  . ezetimibe  10 mg Oral Daily  . furosemide  40 mg Oral Daily  . heparin  5,000 Units Subcutaneous Q8H  . levofloxacin  750 mg Oral Q48H  . metoprolol succinate  25  mg Oral Daily  . nystatin   Topical QID  . senna  1 tablet Oral Daily  . sodium chloride  3 mL Intravenous Q12H  . DISCONTD: furosemide  40 mg Oral BID  . DISCONTD: piperacillin-tazobactam (ZOSYN)  IV  3.375 g Intravenous Q8H  . DISCONTD: vancomycin  2,000 mg Intravenous Q48H   Continuous Infusions:  PRN Meds:.sodium chloride, HYDROcodone-acetaminophen, metoCLOPramide, sodium chloride, zolpidem, DISCONTD: metoCLOPramide  Assessment/Plan:  Principal Problem:  *Atypical chest pain Active Problems:  Uterine cancer  Pulmonary embolism, Oct 2012  Chronic kidney disease, stage II (mild)  Hypertension  Morbid obesity  Leukocytosis  Lymphedema  Diabetes mellitus, diet controlled  Acute on chronic renal failure  URI (upper respiratory infection)  1. Atypical chest pain: ?costocondritis secondary to cough vs msk r/t cough. Pain improved.  Cardiac enzymes are negative. VQ scan in low probability PE. Would be hesitant to prescribe any NSAIDS given pt's age and CKD. Will continue prn hydrocodone for now. PT/OT to eval  2. URI: on empiric Levaquin D4. Pt has remained afebrile with WBC continues to trend downward. Was on Vanc and Zosyn for 3 days.  Continue to monitor trend.  3. Leukocytosis: No evidence of pna on CXR. Urine culture negative. Pt has remained afebrile. Will continue Levaquin for URI.  4. CKD, stage II: creatine continues to creep up. Likely related to  Lasix . Dose decreased yesterday. Fluid status +.  Continue to monitor  5. DM2: BS stable. No meds pta. Will add SSI to monitor CBGs.  A1C 5.8.  6. Hx htn: currently stable but slightly soft. Continue home BB, norvasc and monitor 7. Morbid Obesity  8. Lymphedema, chronic: stable  9. Hx of PE (07/2011): no longer on anticoag. Continue dvt proph  10. Prophylaxis: on sq heparin.  11. Dispo: to SNF likely Monday 2/25.      LOS: 4 days   [facility] M 12/07/2011, 3:57 PM

## 2011-12-08 LAB — BASIC METABOLIC PANEL
BUN: 43 mg/dL — ABNORMAL HIGH (ref 6–23)
Chloride: 94 mEq/L — ABNORMAL LOW (ref 96–112)
GFR calc Af Amer: 26 mL/min — ABNORMAL LOW (ref >90)
GFR calc non Af Amer: 22 mL/min — ABNORMAL LOW (ref >90)
Potassium: 4.3 mEq/L (ref 3.5–5.1)
Sodium: 132 mEq/L — ABNORMAL LOW (ref 135–145)

## 2011-12-08 LAB — CBC
HCT: 29.1 % — ABNORMAL LOW (ref 36.0–46.0)
MCHC: 32 g/dL (ref 30.0–36.0)
RDW: 15.1 % (ref 11.5–15.5)
WBC: 13.9 10*3/uL — ABNORMAL HIGH (ref 4.0–10.5)

## 2011-12-08 MED ORDER — ONDANSETRON HCL 4 MG/2ML IJ SOLN
4.0000 mg | INTRAMUSCULAR | Status: DC | PRN
  Administered 2011-12-08 – 2011-12-10 (×5): 4 mg via INTRAVENOUS
  Filled 2011-12-08 (×4): qty 2

## 2011-12-08 MED ORDER — ONDANSETRON HCL 4 MG/2ML IJ SOLN
INTRAMUSCULAR | Status: AC
  Filled 2011-12-08: qty 2

## 2011-12-08 NOTE — Progress Notes (Signed)
Patient seen and examined ,discussed with NP .agree with the above.

## 2011-12-08 NOTE — Progress Notes (Signed)
Subjective: "I slept ok" Reports intermittent bilateral "side pain" with movement and deep breath. No events during night  Objective: Vital signs Filed Vitals:   12/07/11 0542 12/07/11 1425 12/07/11 2121 12/08/11 0444  BP: 121/62 92/57 92/49  114/56  Pulse: 79 80 73 76  Temp: 97.8 F (36.6 C) 98.7 F (37.1 C) 98.8 F (37.1 C)   TempSrc: Oral Oral Oral Oral  Resp: 18 20 18 20   Height:      Weight: 141.023 kg (310 lb 14.4 oz)   142.4 kg (313 lb 15 oz)  SpO2: 100% 100% 98% 99%   Weight change: 1.377 kg (3 lb 0.6 oz) Last BM Date: 12/05/11  Intake/Output from previous day: 02/22 0701 - 02/23 0700 In: 800 [P.O.:800] Out: 1600 [Urine:1600]     Physical Exam: General: Alert, awake, oriented x3, in no acute distress. Sitting up in bed eating HEENT: No bruits, no goiter. PERRL Poor dentition.  Heart: Regular rate and rhythm, without murmurs, rubs, gallops. Lungs: Normal effort.  Clear to auscultation bilaterally. No wheeze, rhonchi Abdomen: Obese Soft, nontender, nondistended, positive bowel sounds. Extremities: No clubbing cyanosis with positive pedal pulses. Chronic lymphedema Neuro: Grossly intact, nonfocal. Speech clear. Cranial nerve II-XII intact    Lab Results: Basic Metabolic Panel:  Basename 12/08/11 0523 12/07/11 0443  NA 132* 132*  K 4.3 4.4  CL 94* 95*  CO2 30 30  GLUCOSE 95 87  BUN 43* 44*  CREATININE 2.04* 2.45*  CALCIUM 9.2 8.8  MG -- --  PHOS -- --   Liver Function Tests: No results found for this basename: AST:2,ALT:2,ALKPHOS:2,BILITOT:2,PROT:2,ALBUMIN:2 in the last 72 hours No results found for this basename: LIPASE:2,AMYLASE:2 in the last 72 hours No results found for this basename: AMMONIA:2 in the last 72 hours CBC:  Basename 12/08/11 0523 12/07/11 0443  WBC 13.9* 13.7*  NEUTROABS -- --  HGB 9.3* 9.1*  HCT 29.1* 28.6*  MCV 91.2 91.7  PLT 466* 413*   Cardiac Enzymes: No results found for this basename:  CKTOTAL:3,CKMB:3,CKMBINDEX:3,TROPONINI:3 in the last 72 hours BNP: No results found for this basename: PROBNP:3 in the last 72 hours D-Dimer: No results found for this basename: DDIMER:2 in the last 72 hours CBG: No results found for this basename: GLUCAP:6 in the last 72 hours Hemoglobin A1C:  Basename 12/07/11 0443  HGBA1C 5.8*   Fasting Lipid Panel: No results found for this basename: CHOL,HDL,LDLCALC,TRIG,CHOLHDL,LDLDIRECT in the last 72 hours Thyroid Function Tests: No results found for this basename: TSH,T4TOTAL,FREET4,T3FREE,THYROIDAB in the last 72 hours Anemia Panel: No results found for this basename: VITAMINB12,FOLATE,FERRITIN,TIBC,IRON,RETICCTPCT in the last 72 hours Coagulation: No results found for this basename: LABPROT:2,INR:2 in the last 72 hours Urine Drug Screen: Drugs of Abuse  No results found for this basename: labopia, cocainscrnur, labbenz, amphetmu, thcu, labbarb    Alcohol Level: No results found for this basename: ETH:2 in the last 72 hours Urinalysis: No results found for this basename: COLORURINE:2,APPERANCEUR:2,LABSPEC:2,PHURINE:2,GLUCOSEU:2,HGBUR:2,BILIRUBINUR:2,KETONESUR:2,PROTEINUR:2,UROBILINOGEN:2,NITRITE:2,LEUKOCYTESUR:2 in the last 72 hours Misc. Labs:  Recent Results (from the past 240 hour(s))  URINE CULTURE     Status: Normal   Collection Time   12/04/11 11:58 AM      Component Value Range Status Comment   Specimen Description URINE, RANDOM   Final    Special Requests NONE   Final    Culture  Setup Time [id number]   Final    Colony Count 75,000 COLONIES/ML   Final    Culture     Final    Value: DIPHTHEROIDS(CORYNEBACTERIUM SPECIES)  Note: Standardized susceptibility testing for this organism is not available.   Report Status 12/05/2011 FINAL   Final     Studies/Results: No results found.  Medications: Scheduled Meds:   . amLODipine  10 mg Oral Daily  . bisacodyl  10 mg Rectal Once  . ezetimibe  10 mg Oral Daily  .  furosemide  40 mg Oral Daily  . heparin  5,000 Units Subcutaneous Q8H  . levofloxacin  750 mg Oral Q48H  . metoprolol succinate  25 mg Oral Daily  . nystatin   Topical QID  . senna  1 tablet Oral Daily  . sodium chloride  3 mL Intravenous Q12H   Continuous Infusions:  PRN Meds:.sodium chloride, HYDROcodone-acetaminophen, metoCLOPramide, sodium chloride, zolpidem  Assessment/Plan:  Principal Problem:  *Atypical chest pain Active Problems:  Uterine cancer  Pulmonary embolism, Oct 2012  Chronic kidney disease, stage II (mild)  Hypertension  Morbid obesity  Leukocytosis  Lymphedema  Diabetes mellitus, diet controlled  Acute on chronic renal failure  URI (upper respiratory infection)  1. Atypical chest pain: ?costocondritis secondary to cough vs msk r/t cough. Pain improved. Cardiac enzymes are negative. VQ scan in low probability PE. Would be hesitant to prescribe any NSAIDS given pt's age and CKD. Will continue prn hydrocodone for now. Denies CP.  2. URI: on empiric Levaquin D5. Pt has remained afebrile with WBC stable at 13.9 Was on Vanc and Zosyn for 3 days. Continue to monitor trend.  3. Leukocytosis: No evidence of pna on CXR. Urine culture negative. Pt has remained afebrile. Will continue Levaquin for URI. Afebrile, non-toxic appearing.  4. CKD, stage II: creatine improved. Likely related to Lasix . Dose decreased 2/21. Some concern for interstitial nephritis secondary to antibiotics. Vanc level 22.3. Urine studies pending.   Fluid status +. Continue to monitor  5. DM2: BS stable. No meds pta. Will add SSI to monitor CBGs. A1C 5.8.  6. Hx htn: currently stable but slightly soft. Continue home BB, norvasc and monitor  7. Morbid Obesity  8. Lymphedema, chronic: stable  9. Hx of PE (07/2011): no longer on anticoag. Continue dvt proph  10. Prophylaxis: on sq heparin.  11. Dispo: to SNF likely Monday 2/25.     LOS: 5 days   [facility] M 12/08/2011, 9:03 AM

## 2011-12-08 NOTE — Progress Notes (Signed)
Clinical social worker received hand off to provide patient with bed offers. No bed offers at this time, [facility] considering and csw will need to follow up on Monday. .Clinical social worker continuing to follow pt to assist with pt dc plans and further csw needs.   Catha Gosselin, LCSWA  Weekend coverage [phone] 13:19pm

## 2011-12-09 MED ORDER — FUROSEMIDE 40 MG PO TABS: 40.0000 mg | ORAL_TABLET | Freq: Every day | ORAL | Status: DC

## 2011-12-09 MED ORDER — LEVOFLOXACIN 750 MG PO TABS: 750.0000 mg | ORAL_TABLET | ORAL | Status: AC

## 2011-12-09 MED ORDER — NYSTATIN 100000 UNIT/GM EX POWD: 1.0000 g | Freq: Four times a day (QID) | CUTANEOUS | Status: DC

## 2011-12-09 MED ORDER — METOCLOPRAMIDE HCL 5 MG PO TABS: 5.0000 mg | ORAL_TABLET | Freq: Four times a day (QID) | ORAL | Status: DC | PRN

## 2011-12-09 NOTE — Discharge Summary (Signed)
Physician Discharge Summary  Patient ID: Christine [name] MRN: [id number] DOB/AGE: [birth year] 78 y.o.  Admit date: 12/03/2011 Discharge date: 12/09/2011  Primary Care Physician:  Alva Garnet., MD, MD   Discharge Diagnoses:    Atypical chest pain URI Leukocytosis CKd    Present on Admission:  .Acute on chronic renal failure .Atypical chest pain .URI (upper respiratory infection) .Uterine cancer .Morbid obesity .Leukocytosis .Diabetes mellitus, diet controlled .Chronic kidney disease, stage II (mild) .Pulmonary embolism, Oct 2012 .Lymphedema .Hypertension  Medication List  As of 12/09/2011  3:20 PM   TAKE these medications         amLODipine 10 MG tablet   Commonly known as: NORVASC   Take 10 mg by mouth every morning.      ezetimibe 10 MG tablet   Commonly known as: ZETIA   Take 10 mg by mouth daily.      furosemide 40 MG tablet   Commonly known as: LASIX   Take 1 tablet (40 mg total) by mouth daily.      HYDROcodone-acetaminophen 10-325 MG per tablet   Commonly known as: NORCO   Take 1 tablet by mouth every 6 (six) hours as needed. For pain.      levofloxacin 750 MG tablet   Commonly known as: LEVAQUIN   Take 1 tablet (750 mg total) by mouth every other day. Take 1 tablet (750 mg total) by mouth every other day. Give 2 hours apart from vitamins, iron, and antacids.      metoCLOPramide 5 MG tablet   Commonly known as: REGLAN   Take 1 tablet (5 mg total) by mouth 4 (four) times daily as needed.      metoprolol succinate 25 MG 24 hr tablet   Commonly known as: TOPROL-XL   Take 25 mg by mouth daily.      nystatin powder   Commonly known as: MYCOSTATIN   Apply 1 g topically 4 (four) times daily. Rash      nystatin 100000 UNIT/GM Powd   Apply 1 g (100,000 Units total) topically 4 (four) times daily.      senna 8.6 MG Tabs   Commonly known as: SENOKOT   Take 1 tablet by mouth.      zolpidem 5 MG tablet   Commonly known as: AMBIEN   Take 5 mg by  mouth at bedtime as needed. For sleep.             Disposition and Follow-up: Pt medically stable and ready for discharge.  Consults:  None   Physical exam:  General: Alert, awake, oriented x3, in no acute distress. Visiting with family, smiling  HEENT: No bruits, no goiter. PERRL Poor dention. Mucus membranes mouth moist/pink  Heart: Regular rate and rhythm, without murmurs, rubs, gallops.  Lungs:Normal effort. Breath sounds somewhat distant but clear to auscultation bilaterally.  Abdomen:Obese Soft, nontender, nondistended, positive bowel sounds.  Extremities: No clubbing cyanosis with positive pedal pulses. Chronic lympedema  Neuro: Grossly intact, nonfocal. Speech clear.    Significant Diagnostic Studies:  Dg Chest 2 View  12/03/2011  *RADIOLOGY REPORT*  Clinical Data: Shortness of breath and weakness.  Bilateral leg swelling.  CHEST - 2 VIEW  Comparison: 09/29/2011.  Findings: Poor inspiration.  No gross change in mild enlargement of the cardiac silhouette.  Decreased prominence of the pulmonary vasculature and interstitial markings.  Lower thoracic spine degenerative changes.  IMPRESSION: Cardiomegaly with decreased pulmonary vascular congestion and improved interstitial pulmonary edema.  Original Report Authenticated By: Londell Moh  REID, M.D.   Nm Pulmonary Per & Vent  12/04/2011  *RADIOLOGY REPORT*  Clinical Data: Chest pain, shortness of breath  NM PULMONARY VENTILATION AND PERFUSION SCAN  Radiopharmaceutical: 3.0 mCi technetium 13m MAA and 11.0 mCi of xenon 133  Comparison: Chest x-ray of 12/03/2011  Findings: On the perfusion phase, no significant perfusion defect is seen. On the ventilation phase there is normal wash-in of the radionuclide.  On the wash out phase only minimal gas trapping is present. This nuclear medicine ventilation perfusion lung scan is considered low probability for pulmonary embolism.  IMPRESSION: Low probability of pulmonary embolism.  Original Report  Authenticated By: Juline Patch, M.D.    Labs Reviewed  CBC - Abnormal; Notable for the following:    WBC 25.6 (*)    RBC 3.75 (*)    Hemoglobin 11.4 (*)    HCT 34.4 (*)    Platelets 482 (*)    All other components within normal limits  POCT I-STAT, CHEM 8 - Abnormal; Notable for the following:    BUN 44 (*)    Creatinine, Ser 2.00 (*)    Glucose, Bld 139 (*)    All other components within normal limits  URINALYSIS, ROUTINE W REFLEX MICROSCOPIC - Abnormal; Notable for the following:    APPearance CLOUDY (*)    Hgb urine dipstick TRACE (*)    Leukocytes, UA SMALL (*)    All other components within normal limits  URINE MICROSCOPIC-ADD ON - Abnormal; Notable for the following:    Squamous Epithelial / LPF MANY (*)    Bacteria, UA MANY (*)    All other components within normal limits  BASIC METABOLIC PANEL - Abnormal; Notable for the following:    Sodium 133 (*)    Chloride 94 (*)    Glucose, Bld 113 (*)    BUN 40 (*)    Creatinine, Ser 1.89 (*)    GFR calc non Af Amer 24 (*)    GFR calc Af Amer 28 (*)    All other components within normal limits  CBC - Abnormal; Notable for the following:    WBC 20.5 (*)    RBC 3.68 (*)    Hemoglobin 10.8 (*) REPEATED TO VERIFY   HCT 33.8 (*)    Platelets 463 (*)    All other components within normal limits  CBC - Abnormal; Notable for the following:    WBC 19.1 (*)    RBC 3.48 (*)    Hemoglobin 10.2 (*)    HCT 32.0 (*)    Platelets 485 (*)    All other components within normal limits  BASIC METABOLIC PANEL - Abnormal; Notable for the following:    Chloride 95 (*)    BUN 38 (*)    Creatinine, Ser 1.97 (*)    GFR calc non Af Amer 23 (*)    GFR calc Af Amer 27 (*)    All other components within normal limits  CBC - Abnormal; Notable for the following:    WBC 15.5 (*)    RBC 3.27 (*)    Hemoglobin 9.5 (*)    HCT 30.3 (*)    Platelets 424 (*)    All other components within normal limits  BASIC METABOLIC PANEL - Abnormal; Notable  for the following:    Sodium 133 (*)    Chloride 94 (*)    BUN 45 (*)    Creatinine, Ser 2.38 (*)    GFR calc non Af Amer 18 (*)  GFR calc Af Amer 21 (*)    All other components within normal limits  CBC - Abnormal; Notable for the following:    WBC 13.7 (*)    RBC 3.12 (*)    Hemoglobin 9.1 (*)    HCT 28.6 (*)    Platelets 413 (*)    All other components within normal limits  BASIC METABOLIC PANEL - Abnormal; Notable for the following:    Sodium 132 (*)    Chloride 95 (*)    BUN 44 (*)    Creatinine, Ser 2.45 (*)    GFR calc non Af Amer 18 (*)    GFR calc Af Amer 21 (*)    All other components within normal limits  HEMOGLOBIN A1C - Abnormal; Notable for the following:    Hemoglobin A1C 5.8 (*)    Mean Plasma Glucose 120 (*)    All other components within normal limits  CBC - Abnormal; Notable for the following:    WBC 13.9 (*)    RBC 3.19 (*)    Hemoglobin 9.3 (*)    HCT 29.1 (*)    Platelets 466 (*)    All other components within normal limits  BASIC METABOLIC PANEL - Abnormal; Notable for the following:    Sodium 132 (*)    Chloride 94 (*)    BUN 43 (*)    Creatinine, Ser 2.04 (*)    GFR calc non Af Amer 22 (*)    GFR calc Af Amer 26 (*)    All other components within normal limits  PRO B NATRIURETIC PEPTIDE  PROTIME-INR  POCT I-STAT TROPONIN I  URINE CULTURE  VANCOMYCIN, RANDOM  UREA NITROGEN, URINE  CREATININE, URINE, RANDOM  URINALYSIS, ROUTINE W REFLEX MICROSCOPIC  CYTOLOGY - NON PAP        Dg Chest 2 View  12/03/2011  *RADIOLOGY REPORT*  Clinical Data: Shortness of breath and weakness.  Bilateral leg swelling.  CHEST - 2 VIEW  Comparison: 09/29/2011.  Findings: Poor inspiration.  No gross change in mild enlargement of the cardiac silhouette.  Decreased prominence of the pulmonary vasculature and interstitial markings.  Lower thoracic spine degenerative changes.  IMPRESSION: Cardiomegaly with decreased pulmonary vascular congestion and improved  interstitial pulmonary edema.  Original Report Authenticated By: Darrol Angel, M.D.   Dg Ribs Bilateral  12/05/2011  *RADIOLOGY REPORT*  Clinical Data: History of pain in the rib area.  No history injury. Pain in the anterior rib area bilaterally.  BILATERAL RIBS - 3+ VIEW  Comparison: 09/29/2011.  Findings: There is moderate enlargement of the cardiac silhouette. Ectasia and nonaneurysmal calcification of the thoracic aorta are seen. There is mild vascular congestion pattern.  There is moderate sized right pleural effusion with associated right basilar atelectasis. No pneumothorax is seen.  There is osteopenic appearance of the bones.  No rib fracture or rib destruction is evident.  Surgical staples are seen in the upper abdomen.  Inferior vena cava Greenfield filter is in place.  IMPRESSION: Moderate cardiac silhouette enlargement with vascular congestion pattern.  Moderate sized right pleural effusion with associated right basilar atelectasis.  This most likely reflects element of volume overload and / or congestive heart failure.  No pneumothorax is seen.  No rib fracture or destruction is evident.  There is osteopenic appearance of bones.  Original Report Authenticated By: Crawford Givens, M.D.   Nm Pulmonary Per & Vent  12/04/2011  *RADIOLOGY REPORT*  Clinical Data: Chest pain, shortness of breath  NM PULMONARY  VENTILATION AND PERFUSION SCAN  Radiopharmaceutical: 3.0 mCi technetium 73m MAA and 11.0 mCi of xenon 133  Comparison: Chest x-ray of 12/03/2011  Findings: On the perfusion phase, no significant perfusion defect is seen. On the ventilation phase there is normal wash-in of the radionuclide.  On the wash out phase only minimal gas trapping is present. This nuclear medicine ventilation perfusion lung scan is considered low probability for pulmonary embolism.  IMPRESSION: Low probability of pulmonary embolism.  Original Report Authenticated By: Juline Patch, M.D.   Dg Abd 2 Views  12/05/2011   *RADIOLOGY REPORT*  Clinical Data: History of abdominal pain.  History of constipation.  ABDOMEN - 2 VIEW  Comparison: None.  Findings: There is a moderate amount of fecal material within the colon predominantly within the distal transverse colon and splenic flexure portions.  No impaction is seen. Small bowel gas pattern is normal.  Surgical staples are seen in the left upper quadrant of the abdomen.  A Greenfield inferior vena cava filter is seen in place at the level of L2-L3.  No pneumoperitoneum is evident. Pelvic phleboliths are present.  Degenerative spondylosis compatible with age is seen.  There is enlargement of the cardiac silhouette.  There is right pleural effusion of moderate size with associated right basilar atelectasis.  IMPRESSION: Moderate amount of fecal material within the colon predominantly within the distal transverse colon and splenic flexure portions. No impaction is seen. No pneumoperitoneum.  Cardiac silhouette enlargement.  Right pleural effusion with associated right basilar atelectasis.  Original Report Authenticated By: Crawford Givens, M.D.       Brief H and P: For complete details please refer to admission H and P, but in brief   Christine [name] is an 78 y.o. female with history of recurrent PE, severe vaginal bleed from endometrial carcinoma, status post IVC placement because of vaginal bleed, morbid obesity, hypertension, diet-controlled diabetes, chronic kidney disease, history of nephrolithiasis, status post hysterectomy with bilateral salpingo-oophorectomy, brought in by her son to the emergency room on 12/04/11 because of right lower rib pain. She stated she has been coughing some yellow sputum productive cough. In the emergency room, she also has left lower rib pain as well. She denied any shortness of breath or substernal chest pain. Evaluation in the emergency room included a chest x-ray which shows improved vascular congestion, a creatinine of 2.0, and leukocytosis with  white count of 25,000. Her EKG showed no acute ST-T changes. She was started on vancomycin and Zosyn in the emergency room, and hospitalist was asked to admit patient for ventilation perfusion scan. Her renal insufficiency precludes the use of a CT pulmonary angiogram.   Hospital Course:  No resolved problems to display.  Active Hospital Problems  Diagnoses Date Noted   . Atypical chest pain 12/04/2011   . URI (upper respiratory infection) 12/04/2011   . Acute on chronic renal failure 10/20/2011   . Diabetes mellitus, diet controlled 10/03/2011   . Leukocytosis 09/29/2011   . Lymphedema 09/29/2011   . Morbid obesity 09/20/2011   . Chronic kidney disease, stage II (mild) 09/20/2011   . Hypertension 09/20/2011   . Pulmonary embolism, Oct 2012    . Uterine cancer      Resolved Hospital Problems  Diagnoses Date Noted Date Resolved    Principal Problem:  *Atypical chest pain Active Problems:  Uterine cancer  Pulmonary embolism, Oct 2012  Chronic kidney disease, stage II (mild)  Hypertension  Morbid obesity  Leukocytosis  Lymphedema  Diabetes mellitus,  diet controlled  Acute on chronic renal failure  URI (upper respiratory infection)  1.Atypical chest pain: ?costocondritis secondary to cough vs msk r/t cough. Pain improved. Cardiac enzymes are negative. VQ scan in low probability PE. Would be hesitant to prescribe any NSAIDS given pt's age and CKD. Will continue prn hydrocodone for now. At time of discharge pain with movement and coughing.  2. URI: on empiric Levaquin D6. Pt has remained afebrile with WBC stable at 13.9 .Was on Vanc and Zosyn for 3 days. No coughing. Sats maintained 99-98% ra. Continue to monitor Will be discharged with 2 more doses levaquin 3. Leukocytosis: No evidence of pna on CXR. Urine culture negative. Pt has remained afebrile. Will continue Levaquin for URI. Afebrile, non-toxic appearing. May need OP follow up.  4. CKD, stage II: creatine continues to  improve. Likely related to Lasix and vancomycin. Lasix decreased 2/21. Some concern for interstitial nephritis secondary to antibiotics. Vanc level 22.3. Fluid status +. Continue to monitor  5. DM2: BS stable during this hospitalization. No meds pta. Was covered infrequently with SSI. A1C 5.8.  6. Hx htn: stable during this hospitalization. Continue home BB, norvasc and monitor  7. Morbid Obesity  8. Lymphedema, chronic: stable  9. Hx of PE (07/2011): no longer on anticoag. Continue dvt proph  10. Prophylaxis: on sq heparin.    Time spent on Discharge: 35 minutes  Signed: Gwenyth Bender 12/09/2011, 3:20 PM

## 2011-12-09 NOTE — Progress Notes (Signed)
Patient seen and examined, I agree with the above assessment and plan. Discharge to SNF tomorrow

## 2011-12-09 NOTE — Progress Notes (Signed)
ANTIBIOTIC CONSULT NOTE - FOLLOW UP  Pharmacy Consult for Levaquin Indication: URI  No Known Allergies  Patient Measurements: Height: 5\' 6"  (167.6 cm) Weight: 308 lb 10.3 oz (140 kg) IBW/kg (Calculated) : 59.3    Vital Signs: Temp: 98.2 F (36.8 C) (02/24 0611) Temp src: Oral (02/24 0611) BP: 106/65 mmHg (02/24 0926) Pulse Rate: 86  (02/24 0919) Intake/Output from previous day: 02/23 0701 - 02/24 0700 In: 1220 [P.O.:1220] Out: 1300 [Urine:1300] Intake/Output from this shift: Total I/O In: 220 [P.O.:220] Out: 400 [Urine:400]  Labs:  Basename 12/09/11 0129 12/08/11 0523 12/07/11 0443  WBC -- 13.9* 13.7*  HGB -- 9.3* 9.1*  PLT -- 466* 413*  LABCREA 107.5 -- --  CREATININE -- 2.04* 2.45*   Estimated Creatinine Clearance: 32.9 ml/min (by C-G formula based on Cr of 2.04).  Basename 12/07/11 2109  VANCOTROUGH --  VANCOPEAK --  VANCORANDOM 22.3  GENTTROUGH --  GENTPEAK --  GENTRANDOM --  TOBRATROUGH --  TOBRAPEAK --  TOBRARND --  AMIKACINPEAK --  AMIKACINTROU --  AMIKACIN --     Microbiology: Recent Results (from the past 720 hour(s))  URINE CULTURE     Status: Normal   Collection Time   12/04/11 11:58 AM      Component Value Range Status Comment   Specimen Description URINE, RANDOM   Final    Special Requests NONE   Final    Culture  Setup Time [id number]   Final    Colony Count 75,000 COLONIES/ML   Final    Culture     Final    Value: DIPHTHEROIDS(CORYNEBACTERIUM SPECIES)     Note: Standardized susceptibility testing for this organism is not available.   Report Status 12/05/2011 FINAL   Final     Anti-infectives     Start     Dose/Rate Route Frequency Ordered Stop   12/06/11 1800   levofloxacin (LEVAQUIN) tablet 750 mg        750 mg Oral Every 48 hours 12/06/11 1659     12/06/11 0200   vancomycin (VANCOCIN) 2,000 mg in sodium chloride 0.9 % 500 mL IVPB  Status:  Discontinued        2,000 mg 250 mL/hr over 120 Minutes Intravenous Every 48 hours  12/04/11 0306 12/06/11 1616   12/04/11 1000   piperacillin-tazobactam (ZOSYN) IVPB 3.375 g  Status:  Discontinued        3.375 g 12.5 mL/hr over 240 Minutes Intravenous Every 8 hours 12/04/11 0307 12/06/11 1616   12/04/11 0330   vancomycin (VANCOCIN) IVPB 1000 mg/200 mL premix        1,000 mg 200 mL/hr over 60 Minutes Intravenous  Once 12/04/11 0305 12/04/11 0637   12/04/11 0045   vancomycin (VANCOCIN) IVPB 1000 mg/200 mL premix        1,000 mg 200 mL/hr over 60 Minutes Intravenous  Once 12/04/11 0022 12/04/11 0312   12/04/11 0045  piperacillin-tazobactam (ZOSYN) 3.375 g in dextrose 5 % 50 mL IVPB       3.375 g 100 mL/hr over 30 Minutes Intravenous  Once 12/04/11 0022 12/04/11 0153          Assessment:  78 yo F on D#4 levaquin for URI (Vanc and zosyn 2/19-2/21) - Total abx days = 6  Yesterday WBC was down to 13.9 (20.5 on 2/18).  Scr had trending down to 2.04 with a CrCl just above 30 ml/hr  Afebrile  No new culture data  Goal of Therapy:  Levaquin per renal function  Plan:  1.) Continue Levaquin 750 mg IV q48h for a CrCl 20-50 ml/min 2.) Monitor CBC, temp, any new cultures.   Loma Newton Patricia,PharmD 11:50 AM 12/09/2011

## 2011-12-09 NOTE — Progress Notes (Signed)
Subjective: "I feel better today." Pt visiting with family  Objective: Vital signs Filed Vitals:   12/09/11 0611 12/09/11 0746 12/09/11 0919 12/09/11 0926  BP: 102/29 112/64 105/65 106/65  Pulse: 78  86   Temp: 98.2 F (36.8 C)     TempSrc: Oral     Resp: 18     Height:      Weight:      SpO2: 99%      Weight change: -2.4 kg (-5 lb 4.7 oz) Last BM Date: 12/08/11  Intake/Output from previous day: 02/23 0701 - 02/24 0700 In: 1220 [P.O.:1220] Out: 1300 [Urine:1300] Total I/O In: 220 [P.O.:220] Out: 400 [Urine:400]   Physical Exam: General: Alert, awake, oriented x3, in no acute distress. Visiting with family, smiling HEENT: No bruits, no goiter. PERRL Poor dention. Mucus membranes mouth moist/pink Heart: Regular rate and rhythm, without murmurs, rubs, gallops. Lungs:Normal effort. Breath sounds somewhat distant but clear to auscultation bilaterally. Abdomen:Obese Soft, nontender, nondistended, positive bowel sounds. Extremities: No clubbing cyanosis  with positive pedal pulses. Chronic lympedema Neuro: Grossly intact, nonfocal. Speech clear.     Lab Results: Basic Metabolic Panel:  Basename 12/08/11 0523 12/07/11 0443  NA 132* 132*  K 4.3 4.4  CL 94* 95*  CO2 30 30  GLUCOSE 95 87  BUN 43* 44*  CREATININE 2.04* 2.45*  CALCIUM 9.2 8.8  MG -- --  PHOS -- --   Liver Function Tests: No results found for this basename: AST:2,ALT:2,ALKPHOS:2,BILITOT:2,PROT:2,ALBUMIN:2 in the last 72 hours No results found for this basename: LIPASE:2,AMYLASE:2 in the last 72 hours No results found for this basename: AMMONIA:2 in the last 72 hours CBC:  Basename 12/08/11 0523 12/07/11 0443  WBC 13.9* 13.7*  NEUTROABS -- --  HGB 9.3* 9.1*  HCT 29.1* 28.6*  MCV 91.2 91.7  PLT 466* 413*   Cardiac Enzymes: No results found for this basename: CKTOTAL:3,CKMB:3,CKMBINDEX:3,TROPONINI:3 in the last 72 hours BNP: No results found for this basename: PROBNP:3 in the last 72  hours D-Dimer: No results found for this basename: DDIMER:2 in the last 72 hours CBG: No results found for this basename: GLUCAP:6 in the last 72 hours Hemoglobin A1C:  Basename 12/07/11 0443  HGBA1C 5.8*   Fasting Lipid Panel: No results found for this basename: CHOL,HDL,LDLCALC,TRIG,CHOLHDL,LDLDIRECT in the last 72 hours Thyroid Function Tests: No results found for this basename: TSH,T4TOTAL,FREET4,T3FREE,THYROIDAB in the last 72 hours Anemia Panel: No results found for this basename: VITAMINB12,FOLATE,FERRITIN,TIBC,IRON,RETICCTPCT in the last 72 hours Coagulation: No results found for this basename: LABPROT:2,INR:2 in the last 72 hours Urine Drug Screen: Drugs of Abuse  No results found for this basename: labopia, cocainscrnur, labbenz, amphetmu, thcu, labbarb    Alcohol Level: No results found for this basename: ETH:2 in the last 72 hours Urinalysis: No results found for this basename: COLORURINE:2,APPERANCEUR:2,LABSPEC:2,PHURINE:2,GLUCOSEU:2,HGBUR:2,BILIRUBINUR:2,KETONESUR:2,PROTEINUR:2,UROBILINOGEN:2,NITRITE:2,LEUKOCYTESUR:2 in the last 72 hours Misc. Labs:  Recent Results (from the past 240 hour(s))  URINE CULTURE     Status: Normal   Collection Time   12/04/11 11:58 AM      Component Value Range Status Comment   Specimen Description URINE, RANDOM   Final    Special Requests NONE   Final    Culture  Setup Time [id number]   Final    Colony Count 75,000 COLONIES/ML   Final    Culture     Final    Value: DIPHTHEROIDS(CORYNEBACTERIUM SPECIES)     Note: Standardized susceptibility testing for this organism is not available.   Report Status 12/05/2011 FINAL  Final     Studies/Results: No results found.  Medications: Scheduled Meds:   . amLODipine  10 mg Oral Daily  . bisacodyl  10 mg Rectal Once  . ezetimibe  10 mg Oral Daily  . furosemide  40 mg Oral Daily  . heparin  5,000 Units Subcutaneous Q8H  . levofloxacin  750 mg Oral Q48H  . metoprolol succinate  25  mg Oral Daily  . nystatin   Topical QID  . ondansetron      . senna  1 tablet Oral Daily  . sodium chloride  3 mL Intravenous Q12H   Continuous Infusions:  PRN Meds:.sodium chloride, HYDROcodone-acetaminophen, metoCLOPramide, ondansetron, sodium chloride, zolpidem  Assessment/Plan:  Principal Problem:  *Atypical chest pain Active Problems:  Uterine cancer  Pulmonary embolism, Oct 2012  Chronic kidney disease, stage II (mild)  Hypertension  Morbid obesity  Leukocytosis  Lymphedema  Diabetes mellitus, diet controlled  Acute on chronic renal failure  URI (upper respiratory infection) 1.Atypical chest pain: ?costocondritis secondary to cough vs msk r/t cough. Pain improved. Cardiac enzymes are negative. VQ scan in low probability PE. Would be hesitant to prescribe any NSAIDS given pt's age and CKD. Will continue prn hydrocodone for now.   2. URI: on empiric Levaquin D6. Pt has remained afebrile with WBC stable at 13.9 .Was on Vanc and Zosyn for 3 days. No coughing. Sats maintained 99-98% ra.  Continue to monitor  3. Leukocytosis: No evidence of pna on CXR. Urine culture negative. Pt has remained afebrile. Will continue Levaquin for URI. Afebrile, non-toxic appearing. Monitor trend 4. CKD, stage II: creatine continues to improve. Likely related to Lasix and vancomycin. Lasix decreased 2/21. Some concern for interstitial nephritis secondary to antibiotics. Vanc level 22.3.  Fluid status +. Continue to monitor  5. DM2: BS stable. No meds pta. Will add SSI to monitor CBGs. A1C 5.8.  6. Hx htn: currently stable but slightly soft. Continue home BB, norvasc and monitor  7. Morbid Obesity  8. Lymphedema, chronic: stable  9. Hx of PE (07/2011): no longer on anticoag. Continue dvt proph  10. Prophylaxis: on sq heparin.  11. Dispo: to SNF likely Monday 2/25.       LOS: 6 days   [facility] M 12/09/2011, 1:19 PM

## 2011-12-10 MED ORDER — AMLODIPINE BESYLATE 5 MG PO TABS
5.0000 mg | ORAL_TABLET | Freq: Every day | ORAL | Status: DC
  Administered 2011-12-10: 5 mg via ORAL
  Filled 2011-12-10 (×2): qty 1

## 2011-12-10 MED ORDER — AMLODIPINE BESYLATE 5 MG PO TABS: 5.0000 mg | ORAL_TABLET | Freq: Every day | ORAL | Status: DC

## 2011-12-10 NOTE — Progress Notes (Signed)
PATIENT DETAILS Name: Christine [name] Age: 78 y.o. Sex: female Date of Birth: 1931/02/23 Admit Date: 12/03/2011 WGN:[id number],MVHQIONG R., MD, MD POA:   CONSULTS: None  Subjective: Feeling well. Denies any worsening sob. Still "sore" in right chest.   Objective: Vital signs in last 24 hours: Temp:  [98.2 F (36.8 C)-98.6 F (37 C)] 98.6 F (37 C) (02/25 0526) Pulse Rate:  [77-86] 84  (02/25 0526) Resp:  [18-20] 20  (02/25 0526) BP: (101-106)/(61-65) 103/63 mmHg (02/25 0526) SpO2:  [93 %-94 %] 93 % (02/25 0526) Weight:  [139.6 kg (307 lb 12.2 oz)] 139.6 kg (307 lb 12.2 oz) (02/25 0526) Weight change: -0.4 kg (-14.1 oz) Last BM Date: 12/08/11  Intake/Output from previous day:  Intake/Output Summary (Last 24 hours) at 12/10/11 0842 Last data filed at 12/10/11 0500  Gross per 24 hour  Intake    540 ml  Output   1500 ml  Net   -960 ml     Physical Exam:  Gen:  Awake, alert moving to bed from commode, in nad Cardiovascular:  S1S2 RRR, no m/r/g Respiratory: bibasilar crackles, no wheeze or rales. No increased wob.  Gastrointestinal: Abdomen obese, soft, NT, BS+ Extremities: chronic lymphedema   Lab Results:  Lab 12/08/11 0523 12/07/11 0443 12/06/11 0445  HGB 9.3* 9.1* 9.5*  HCT 29.1* 28.6* 30.3*  WBC 13.9* 13.7* 15.5*  PLT 466* 413* 424*     Lab 12/08/11 0523 12/07/11 0443 12/06/11 0445 12/05/11 0930 12/04/11 0429  NA 132* 132* 133* 135 133*  K 4.3 4.4 -- -- --  CL 94* 95* 94* 95* 94*  CO2 30 30 29 30 26   GLUCOSE 95 87 85 96 113*  BUN 43* 44* 45* 38* 40*  CREATININE 2.04* 2.45* 2.38* 1.97* 1.89*  CALCIUM 9.2 8.8 8.9 9.4 9.4  MG -- -- -- -- --  PHOS -- -- -- -- --    Studies/Results: No results found.  Medications: Scheduled Meds:   . amLODipine  10 mg Oral Daily  . bisacodyl  10 mg Rectal Once  . ezetimibe  10 mg Oral Daily  . furosemide  40 mg Oral Daily  . heparin  5,000 Units Subcutaneous Q8H  . levofloxacin  750 mg Oral Q48H  . metoprolol  succinate  25 mg Oral Daily  . nystatin   Topical QID  . senna  1 tablet Oral Daily  . sodium chloride  3 mL Intravenous Q12H   Continuous Infusions:  PRN Meds:.sodium chloride, HYDROcodone-acetaminophen, metoCLOPramide, ondansetron, sodium chloride, zolpidem Antibiotics: Anti-infectives     Start     Dose/Rate Route Frequency Ordered Stop   12/09/11 0000   levofloxacin (LEVAQUIN) 750 MG tablet        750 mg Oral Every 48 hours 12/09/11 1520 12/19/11 2359   12/06/11 1800   levofloxacin (LEVAQUIN) tablet 750 mg        750 mg Oral Every 48 hours 12/06/11 1659     12/06/11 0200   vancomycin (VANCOCIN) 2,000 mg in sodium chloride 0.9 % 500 mL IVPB  Status:  Discontinued        2,000 mg 250 mL/hr over 120 Minutes Intravenous Every 48 hours 12/04/11 0306 12/06/11 1616   12/04/11 1000   piperacillin-tazobactam (ZOSYN) IVPB 3.375 g  Status:  Discontinued        3.375 g 12.5 mL/hr over 240 Minutes Intravenous Every 8 hours 12/04/11 0307 12/06/11 1616   12/04/11 0330   vancomycin (VANCOCIN) IVPB 1000 mg/200 mL premix  1,000 mg 200 mL/hr over 60 Minutes Intravenous  Once 12/04/11 0305 12/04/11 0637   12/04/11 0045   vancomycin (VANCOCIN) IVPB 1000 mg/200 mL premix        1,000 mg 200 mL/hr over 60 Minutes Intravenous  Once 12/04/11 0022 12/04/11 0312   12/04/11 0045  piperacillin-tazobactam (ZOSYN) 3.375 g in dextrose 5 % 50 mL IVPB       3.375 g 100 mL/hr over 30 Minutes Intravenous  Once 12/04/11 0022 12/04/11 0153           Assessment/Plan:  1.Atypical chest pain: ?costocondritis secondary to cough vs msk r/t cough. Pain improved. Cardiac enzymes are negative. VQ scan in low probability PE. Would be hesitant to prescribe any NSAIDS given pt's age and CKD. Will continue prn hydrocodone for now.   2. URI: on empiric Levaquin to be continued until 3/6 (getting q48hours). Pt has remained afebrile with WBC stable at 13.9 .Was on Vanc and Zosyn for 3 days. No coughing. Sats  maintained 99-98% ra. Continue to monitor   3. Leukocytosis: No evidence of pna on CXR. Urine culture negative. Pt has remained afebrile. Will continue Levaquin for URI. Afebrile, non-toxic appearing. Monitor trend   4. CKD, stage II: creatine continues to improve. Likely related to Lasix and vancomycin. Lasix decreased 2/21. Some concern for interstitial nephritis secondary to antibiotics. Vanc level 22.3. Fluid status +. Continue to monitor   5. DM2: BS stable. No meds pta. Will add SSI to monitor CBGs. A1C 5.8.   6. Hx htn: currently stable but slightly soft. Continue home BB. Will decrease Amlodipine dose and monitor.   7. Chronic diastolic HF: EF 55-60% per echo done 10/12. May need increased Lasix dose (decreased this admit). Continue to monitor closely.  8. Morbid Obesity   9. Lymphedema, chronic: stable   10. Hx of PE (07/2011): no longer on anticoag. Continue dvt proph   11. Prophylaxis: on sq heparin.   12. Dispo: stable for d/c to SNF today if bed available. See d/c summary done 2/24.   Cordelia Pen, NP-C [facility]  pgr [phone]     LOS: 7 days    12/10/2011, 8:42 AM

## 2011-12-10 NOTE — Discharge Summary (Signed)
Patient seen and examined ,denies any new complaints ,work up was negative and patient has atypical right sided ribs/chest pain .Renal function is improving contiue pain control ,follow with PCP within one week period ,will need repeat renal panel with PCP and any further work up for her chronic multiple medical problems will be deferred to PCP as outpatient.

## 2011-12-10 NOTE — Progress Notes (Signed)
Physical Therapy Treatment Patient Details Name: Christine [name] MRN: [id number] DOB: May 28, 1931 Today's Date: 12/10/2011  PT Assessment/Plan  PT - Assessment/Plan Comments on Treatment Session: Pt progressing slowly with ambulation. Continues to have dyspnea with ambulation. Limited activity tolerance.  PT Plan: Discharge plan remains appropriate Follow Up Recommendations: Skilled nursing facility Equipment Recommended: Defer to next venue PT Goals  Acute Rehab PT Goals PT Goal: Supine/Side to Sit - Progress: Progressing toward goal PT Goal: Sit to Supine/Side - Progress: Progressing toward goal PT Goal: Sit to Stand - Progress: Progressing toward goal PT Goal: Stand to Sit - Progress: Progressing toward goal PT Goal: Ambulate - Progress: Progressing toward goal  PT Treatment Precautions/Restrictions  Restrictions Weight Bearing Restrictions: No Mobility (including Balance) Bed Mobility Bed Mobility: Yes Supine to Sit: 4: Min assist;With rails;HOB elevated (Comment degrees) Supine to Sit Details (indicate cue type and reason): Increased time. Assist for L LE off EOB only Sit to Supine: 3: Mod assist Sit to Supine - Details (indicate cue type and reason): Assist for bil LEs onto bed. Increased time.  Transfers Transfers: Yes Sit to Stand Details (indicate cue type and reason): Min-guard assist. VCs safety, hand placement.  Stand to Sit Details: Min-guard assist. VCs safety, hand placement.  Ambulation/Gait Ambulation/Gait: Yes Ambulation/Gait Assistance Details (indicate cue type and reason): VCs posture. Fatigues easily. Followed with recliner. Dyspnea 3/4 with ambulation.  Ambulation Distance (Feet): 50 Feet (50'x1, 35'x1. ) Assistive device: Rolling walker Gait Pattern: Trunk flexed;Step-through pattern;Decreased step length - left;Decreased step length - right;Decreased stride length    Exercise    End of Session PT - End of Session Equipment Utilized During Treatment:  Gait belt Activity Tolerance: Patient limited by fatigue Patient left: in bed;with call bell in reach General Behavior During Session: [facility] for tasks performed Cognition: [facility] for tasks performed  Christine [name] [facility] 12/10/2011, 3:42 PM [phone]

## 2011-12-10 NOTE — Progress Notes (Signed)
   CARE MANAGEMENT NOTE 12/10/2011  Patient:  Christine [name], Christine [name]   Account Number:  [id number]  Date Initiated:  12/10/2011  Documentation initiated by:  Lanier Clam  Subjective/Objective Assessment:   ADMITTED W/CHEST PAIN.     Action/Plan:   FROM HOME W/SON WHO WORKS DURING THE DAY.   Anticipated DC Date:  12/13/2011   Anticipated DC Plan:  SKILLED NURSING FACILITY  In-house referral  Clinical Social Worker         Choice offered to / List presented to:             Status of service:  In process, will continue to follow Medicare Important Message given?   (If response is "NO", the following Medicare IM given date fields will be blank) Date Medicare IM given:   Date Additional Medicare IM given:    Discharge Disposition:    Per UR Regulation:  Reviewed for med. necessity/level of care/duration of stay  Comments:  12/10/11 [facility] RN,BSN [name] [id number] UPDATE:SPOKE TO SON CHARLES-PT-SNF.AGREES TO SNF.DIDN'T LIKE [facility],OR [facility].CSW NOTIFIED. PT-SNF/HH.VM MESSAGE FROM SON CHARLES Ogren TEL#[phone],NO ONE IS @ HOME DURING THE DAY,HE WORKS FULL TIMET(TEACHER).LEFT VM W/CALL BACKTEL# TO DISCUSS RECOMMENDATIONS,AWAIT CALL BACK.NOTIFIED [facility].

## 2011-12-10 NOTE — Progress Notes (Signed)
Patient seen and examined ,medically stable for discharge.follow with PCP in one week .

## 2011-12-10 NOTE — Progress Notes (Signed)
   CARE MANAGEMENT NOTE 12/10/2011  Patient:  [name], Christine   Account Number:  [id number]  Date Initiated:  12/10/2011  Documentation initiated by:  Lanier Clam  Subjective/Objective Assessment:   ADMITTED W/CHEST PAIN.     Action/Plan:   FROM HOME W/SON WHO WORKS DURING THE DAY.   Anticipated DC Date:  12/13/2011   Anticipated DC Plan:  SKILLED NURSING FACILITY  In-house referral  Clinical Social Worker         Choice offered to / List presented to:             Status of service:  In process, will continue to follow Medicare Important Message given?   (If response is "NO", the following Medicare IM given date fields will be blank) Date Medicare IM given:   Date Additional Medicare IM given:    Discharge Disposition:    Per UR Regulation:  Reviewed for med. necessity/level of care/duration of stay  Comments:  12/10/11 Lanier Clam RN,BSN [facility] [id number] PT-SNF/HH.VM MESSAGE FROM SON CHARLES Brummitt TEL#[phone],NO ONE IS @ HOME DURING THE DAY,HE WORKS FULL TIMET(TEACHER).LEFT VM W/CALL BACKTEL# TO DISCUSS RECOMMENDATIONS,AWAIT CALL BACK.NOTIFIED [facility].

## 2011-12-11 NOTE — Progress Notes (Signed)
Patient set to discharge to [facility] today. Son, Florinda Marker aware & completed admission paperwork via fax for facility. [facility] called for transportation.   Unice Bailey, LCSWA [phone]

## 2012-01-05 ENCOUNTER — Encounter: Payer: Self-pay | Admitting: Internal Medicine

## 2012-01-05 DIAGNOSIS — Z0000 Encounter for general adult medical examination without abnormal findings: Secondary | ICD-10-CM | POA: Insufficient documentation

## 2012-01-08 ENCOUNTER — Ambulatory Visit (INDEPENDENT_AMBULATORY_CARE_PROVIDER_SITE_OTHER): Payer: Medicare Other | Admitting: Internal Medicine

## 2012-01-08 ENCOUNTER — Encounter: Payer: Self-pay | Admitting: Internal Medicine

## 2012-01-08 ENCOUNTER — Other Ambulatory Visit (INDEPENDENT_AMBULATORY_CARE_PROVIDER_SITE_OTHER): Payer: Medicare Other

## 2012-01-08 VITALS — BP 120/82 | HR 90 | Temp 98.0°F | Ht 66.0 in | Wt 296.0 lb

## 2012-01-08 DIAGNOSIS — Z0000 Encounter for general adult medical examination without abnormal findings: Secondary | ICD-10-CM

## 2012-01-08 DIAGNOSIS — I10 Essential (primary) hypertension: Secondary | ICD-10-CM

## 2012-01-08 DIAGNOSIS — I5031 Acute diastolic (congestive) heart failure: Secondary | ICD-10-CM

## 2012-01-08 DIAGNOSIS — I509 Heart failure, unspecified: Secondary | ICD-10-CM

## 2012-01-08 DIAGNOSIS — Z860100 Personal history of colon polyps, unspecified: Secondary | ICD-10-CM

## 2012-01-08 DIAGNOSIS — M79641 Pain in right hand: Secondary | ICD-10-CM | POA: Insufficient documentation

## 2012-01-08 DIAGNOSIS — R531 Weakness: Secondary | ICD-10-CM

## 2012-01-08 DIAGNOSIS — M79642 Pain in left hand: Secondary | ICD-10-CM | POA: Insufficient documentation

## 2012-01-08 DIAGNOSIS — R0602 Shortness of breath: Secondary | ICD-10-CM

## 2012-01-08 DIAGNOSIS — E119 Type 2 diabetes mellitus without complications: Secondary | ICD-10-CM

## 2012-01-08 DIAGNOSIS — Z95828 Presence of other vascular implants and grafts: Secondary | ICD-10-CM

## 2012-01-08 DIAGNOSIS — M79609 Pain in unspecified limb: Secondary | ICD-10-CM

## 2012-01-08 DIAGNOSIS — R5383 Other fatigue: Secondary | ICD-10-CM

## 2012-01-08 HISTORY — DX: Personal history of colon polyps, unspecified: Z86.0100

## 2012-01-08 HISTORY — DX: Presence of other vascular implants and grafts: Z95.828

## 2012-01-08 HISTORY — DX: Personal history of colonic polyps: Z86.010

## 2012-01-08 LAB — HEPATIC FUNCTION PANEL
ALT: 10 U/L (ref 0–35)
AST: 19 U/L (ref 0–37)
Bilirubin, Direct: 0.1 mg/dL (ref 0.0–0.3)
Total Bilirubin: 0.3 mg/dL (ref 0.3–1.2)

## 2012-01-08 LAB — CBC WITH DIFFERENTIAL/PLATELET
Basophils Relative: 0.3 % (ref 0.0–3.0)
Eosinophils Absolute: 0.4 10*3/uL (ref 0.0–0.7)
Eosinophils Relative: 4.9 % (ref 0.0–5.0)
HCT: 32.8 % — ABNORMAL LOW (ref 36.0–46.0)
Lymphs Abs: 0.8 10*3/uL (ref 0.7–4.0)
MCHC: 32.3 g/dL (ref 30.0–36.0)
MCV: 91.3 fl (ref 78.0–100.0)
Monocytes Absolute: 0.7 10*3/uL (ref 0.1–1.0)
Neutrophils Relative %: 76.3 % (ref 43.0–77.0)
Platelets: 424 10*3/uL — ABNORMAL HIGH (ref 150.0–400.0)
WBC: 8.5 10*3/uL (ref 4.5–10.5)

## 2012-01-08 LAB — BASIC METABOLIC PANEL
BUN: 21 mg/dL (ref 6–23)
Chloride: 97 mEq/L (ref 96–112)
GFR: 47.91 mL/min — ABNORMAL LOW (ref >60.00)
Potassium: 3.9 mEq/L (ref 3.5–5.1)
Sodium: 135 mEq/L (ref 135–145)

## 2012-01-08 LAB — LIPID PANEL
LDL Cholesterol: 126 mg/dL — ABNORMAL HIGH (ref 0–99)
Total CHOL/HDL Ratio: 7
VLDL: 37 mg/dL (ref 0.0–40.0)

## 2012-01-08 LAB — BRAIN NATRIURETIC PEPTIDE: Pro B Natriuretic peptide (BNP): 16 pg/mL (ref 0.0–100.0)

## 2012-01-08 LAB — URINALYSIS, ROUTINE W REFLEX MICROSCOPIC
Bilirubin Urine: NEGATIVE
Hgb urine dipstick: NEGATIVE
Ketones, ur: NEGATIVE
pH: 5.5 (ref 5.0–8.0)

## 2012-01-08 LAB — TSH: TSH: 3.36 u[IU]/mL (ref 0.35–5.50)

## 2012-01-08 NOTE — Assessment & Plan Note (Signed)
stable overall by hx and exam, most recent data reviewed with pt, and pt to continue medical treatment as before  Lab Results  Component Value Date   HGBA1C 5.8* 12/07/2011

## 2012-01-08 NOTE — Assessment & Plan Note (Signed)
For home health re-eval,  to f/u any worsening symptoms or concerns

## 2012-01-08 NOTE — Patient Instructions (Signed)
Continue all other medications as before Please have the pharmacy call with any refills you may need. Please go to LAB in the Basement for the blood and/or urine tests to be done today You will be contacted by phone if any changes need to be made immediately.  Otherwise, you will receive a letter about your results with an explanation. You will be contacted regarding the referral for: [facility], and referral to Hand surgury for ? Of Carpal tunnel Please return in 6 mo with Lab testing done 3-5 days before

## 2012-01-08 NOTE — Assessment & Plan Note (Signed)

## 2012-01-08 NOTE — Assessment & Plan Note (Signed)
stable overall by hx and exam, most recent data reviewed with pt, and pt to continue medical treatment as before; last BNP > 300

## 2012-01-08 NOTE — Progress Notes (Signed)
Subjective:    Patient ID: Christine [name], female    DOB: Aug 22, 1931, 78 y.o.   MRN: [id number]  HPI Here to establish as new pt;  Had episode PNA, and DVT oct 2012, unfort later with coumadin related hemorrhage x 2 in Jan 2013 requiring IVC filter and stop coumadin stop,  Course complicated by CP/pleurisy per pt now resolved,  Today is last day at [facility] rehab, going home tomorrow to live with son and has 4 other children in [place] for support.  Needs [facility] home health re-start.  Does also c/o bilat CTX like symptoms with bilat hand pain and numbness to wrists and hands for 4 wks, worse in the am, feels cold, though fingers are actually warm to touch.  Pt denies chest pain, increased sob or doe, wheezing, orthopnea, PND, increased LE swelling, palpitations, dizziness or syncope.  Pt denies new neurological symptoms such as new headache, or facial or extremity weakness or numbness except for that above.   Pt denies polydipsia, polyuria.  Pt denies worsening depressive symptoms, suicidal ideation or panic. No fever, wt loss, night sweats, loss of appetite, or other constitutional symptoms.  Pt states good ability with ADL's, low fall risk, home safety reviewed and adequate, no significant changes in hearing or vision, and occasionally active with exercise.    Past Medical History  Diagnosis Date  . GERD (gastroesophageal reflux disease)   . Hypertension   . Diabetes mellitus     diet controlled  . Morbid obesity   . Arthritis   . Chronic kidney disease     nephrolithiasis left kidney,s/p open resection  . Status post arthroscopic surgery of right knee   . Diverticulosis of sigmoid colon   . Chronic back pain   . Pneumonia 07/2011    hx  . Pulmonary embolism 07/2011    hx  . Uterine cancer     endometrial high grade adenocarcinoma  . Uterus cancer, sarcoma     high grade invasive carcinosarcoma  . Myocardial infarction 1989  . Bronchitis   . H/O cardiomegaly   . Lymphedema 09/29/2011  .  Diabetes mellitus, diet controlled 10/03/2011  . Chronic kidney disease, stage II (mild) 09/20/2011  . Acute diastolic congestive heart failure, 2D Oct 2012 09/29/2011  . Presence of IVC filter 01/08/2012  . History of colon polyps 01/08/2012   Past Surgical History  Procedure Date  . Colonoscopy     negative  . Knee arthroscopy     right knee  . Cataract extraction, bilateral   . Tubal ligation 1973  . Abdominal hysterectomy 10/05/2008    total with b/l salpingo-oopherectomy  . Kidney stone removal   . Open left knee cartilage repair   . Left index finger surgery     reports that she has never smoked. She has never used smokeless tobacco. She reports that she does not drink alcohol or use illicit drugs. family history includes Breast cancer in her sister; Cervical cancer in her sister; and Colon cancer in her sister. No Known Allergies Current Outpatient Prescriptions on File Prior to Visit  Medication Sig Dispense Refill  . amLODipine (NORVASC) 5 MG tablet Take 1 tablet (5 mg total) by mouth daily.      [name] ezetimibe (ZETIA) 10 MG tablet Take 10 mg by mouth daily.       . furosemide (LASIX) 40 MG tablet Take 1 tablet (40 mg total) by mouth daily.  30 tablet    . HYDROcodone-acetaminophen (NORCO) 10-325 MG per  tablet Take 1 tablet by mouth every 6 (six) hours as needed. For pain.       . metoprolol succinate (TOPROL-XL) 25 MG 24 hr tablet Take 25 mg by mouth daily.        [name] nystatin (MYCOSTATIN) powder Apply 1 g topically 4 (four) times daily. Rash      . nystatin (NYSTOP) 100000 UNIT/GM POWD Apply 1 g (100,000 Units total) topically 4 (four) times daily.      [name] senna (SENOKOT) 8.6 MG TABS Take 1 tablet by mouth.      . zolpidem (AMBIEN) 5 MG tablet Take 5 mg by mouth at bedtime as needed. For sleep.       Review of Systems Review of Systems  Constitutional: Negative for diaphoresis, activity change, appetite change and unexpected weight change.  HENT: Negative for hearing loss, ear  pain, facial swelling, mouth sores and neck stiffness.   Eyes: Negative for pain, redness and visual disturbance.  Respiratory: Negative for shortness of breath and wheezing.   Cardiovascular: Negative for chest pain and palpitations.  Gastrointestinal: Negative for diarrhea, blood in stool, abdominal distention and rectal pain.  Genitourinary: Negative for hematuria, flank pain and decreased urine volume.  Musculoskeletal: Negative for myalgias and joint swelling.  Skin: Negative for color change and wound.  Neurological: Negative for syncope and numbness.  Hematological: Negative for adenopathy.  Psychiatric/Behavioral: Negative for hallucinations, self-injury, decreased concentration and agitation.      Objective:   Physical Exam BP 120/82  Pulse 90  Temp(Src) 98 F (36.7 C) (Oral)  Ht 5\' 6"  (1.676 m)  Wt 296 lb (134.265 kg)  BMI 47.78 kg/m2  SpO2 90% Physical Exam  VS noted Constitutional: Pt is oriented to person, place, and time. Appears well-developed and well-nourished. /morbid obese HENT:  Head: Normocephalic and atraumatic.  Right Ear: External ear normal.  Left Ear: External ear normal.  Nose: Nose normal.  Mouth/Throat: Oropharynx is clear and moist.  Eyes: Conjunctivae and EOM are normal. Pupils are equal, round, and reactive to light.  Neck: Normal range of motion. Neck supple. No JVD present. No tracheal deviation present.  Cardiovascular: Normal rate, regular rhythm, normal heart sounds and intact distal pulses.   Pulmonary/Chest: Effort normal and breath sounds normal.  Abdominal: Soft. Bowel sounds are normal. There is no tenderness.  Musculoskeletal: Normal range of motion. Exhibits no edema.  Lymphadenopathy:  Has no cervical adenopathy.  Bilat LE's with 2-3+ chronic Lymphedema below knees Neurological: Pt is alert and oriented to person, place, and time. Pt has normal reflexes. No cranial nerve deficit.  Skin: Skin is warm and dry. No rash noted.    Psychiatric:  Has  normal mood and affect. Behavior is normal.     Assessment & Plan:

## 2012-01-08 NOTE — Assessment & Plan Note (Signed)
Suggestive of bilat CTS - for hand surgury referral

## 2012-01-16 ENCOUNTER — Encounter (HOSPITAL_COMMUNITY): Payer: Self-pay | Admitting: *Deleted

## 2012-01-16 ENCOUNTER — Other Ambulatory Visit: Payer: Self-pay

## 2012-01-16 ENCOUNTER — Emergency Department (HOSPITAL_COMMUNITY): Payer: Medicare Other

## 2012-01-16 ENCOUNTER — Inpatient Hospital Stay (HOSPITAL_COMMUNITY)
Admission: EM | Admit: 2012-01-16 | Discharge: 2012-01-21 | DRG: 287 | Disposition: A | Discharge status: Home / Self Care | Payer: Medicare Other | Attending: Cardiovascular Disease | Admitting: Cardiovascular Disease

## 2012-01-16 DIAGNOSIS — I350 Nonrheumatic aortic (valve) stenosis: Secondary | ICD-10-CM | POA: Diagnosis present

## 2012-01-16 DIAGNOSIS — Z95828 Presence of other vascular implants and grafts: Secondary | ICD-10-CM | POA: Diagnosis present

## 2012-01-16 DIAGNOSIS — I2699 Other pulmonary embolism without acute cor pulmonale: Secondary | ICD-10-CM | POA: Diagnosis present

## 2012-01-16 DIAGNOSIS — I890 Lymphedema, not elsewhere classified: Secondary | ICD-10-CM | POA: Diagnosis present

## 2012-01-16 DIAGNOSIS — C55 Malignant neoplasm of uterus, part unspecified: Secondary | ICD-10-CM | POA: Diagnosis present

## 2012-01-16 DIAGNOSIS — N184 Chronic kidney disease, stage 4 (severe): Secondary | ICD-10-CM | POA: Diagnosis present

## 2012-01-16 DIAGNOSIS — Z8719 Personal history of other diseases of the digestive system: Secondary | ICD-10-CM

## 2012-01-16 DIAGNOSIS — I480 Paroxysmal atrial fibrillation: Secondary | ICD-10-CM | POA: Diagnosis not present

## 2012-01-16 DIAGNOSIS — R079 Chest pain, unspecified: Secondary | ICD-10-CM | POA: Diagnosis present

## 2012-01-16 HISTORY — DX: Nonrheumatic aortic (valve) stenosis: I35.0

## 2012-01-16 HISTORY — DX: Paroxysmal atrial fibrillation: I48.0

## 2012-01-16 LAB — BASIC METABOLIC PANEL
BUN: 43 mg/dL — ABNORMAL HIGH (ref 6–23)
CO2: 27 mEq/L (ref 19–32)
GFR calc non Af Amer: 21 mL/min — ABNORMAL LOW (ref >90)
Glucose, Bld: 156 mg/dL — ABNORMAL HIGH (ref 70–99)
Potassium: 4.5 mEq/L (ref 3.5–5.1)

## 2012-01-16 LAB — CBC
Hemoglobin: 10.5 g/dL — ABNORMAL LOW (ref 12.0–15.0)
MCH: 29.6 pg (ref 26.0–34.0)
MCHC: 32.1 g/dL (ref 30.0–36.0)
MCV: 92.1 fL (ref 78.0–100.0)

## 2012-01-16 LAB — DIFFERENTIAL
Basophils Relative: 1 % (ref 0–1)
Eosinophils Absolute: 0.2 10*3/uL (ref 0.0–0.7)
Lymphs Abs: 1.9 10*3/uL (ref 0.7–4.0)
Monocytes Absolute: 1.5 10*3/uL — ABNORMAL HIGH (ref 0.1–1.0)
Neutro Abs: 13.4 10*3/uL — ABNORMAL HIGH (ref 1.7–7.7)
Neutrophils Relative %: 78 % — ABNORMAL HIGH (ref 43–77)

## 2012-01-16 LAB — CARDIAC PANEL(CRET KIN+CKTOT+MB+TROPI)
CK, MB: 1.9 ng/mL (ref 0.3–4.0)
Relative Index: 1.3 (ref 0.0–2.5)
Total CK: 148 U/L (ref 7–177)
Troponin I: <0.3 ng/mL (ref <0.30)

## 2012-01-16 LAB — URINALYSIS, ROUTINE W REFLEX MICROSCOPIC
Leukocytes, UA: NEGATIVE
Nitrite: NEGATIVE
Specific Gravity, Urine: 1.022 (ref 1.005–1.030)
pH: 5 (ref 5.0–8.0)

## 2012-01-16 LAB — HEPATIC FUNCTION PANEL
AST: 16 U/L (ref 0–37)
Albumin: 2.5 g/dL — ABNORMAL LOW (ref 3.5–5.2)
Total Protein: 7.5 g/dL (ref 6.0–8.3)

## 2012-01-16 LAB — PROTIME-INR: Prothrombin Time: 15.2 seconds (ref 11.6–15.2)

## 2012-01-16 MED ORDER — SODIUM CHLORIDE 0.9 % IV SOLN
INTRAVENOUS | Status: DC
  Administered 2012-01-16: 20:00:00 via INTRAVENOUS

## 2012-01-16 MED ORDER — ASPIRIN EC 81 MG PO TBEC
81.0000 mg | DELAYED_RELEASE_TABLET | Freq: Every day | ORAL | Status: DC
  Administered 2012-01-18 – 2012-01-21 (×4): 81 mg via ORAL
  Filled 2012-01-16 (×5): qty 1

## 2012-01-16 MED ORDER — HEPARIN BOLUS VIA INFUSION
4000.0000 [IU] | Freq: Once | INTRAVENOUS | Status: AC
  Administered 2012-01-16: 4000 [IU] via INTRAVENOUS

## 2012-01-16 MED ORDER — PANTOPRAZOLE SODIUM 40 MG PO TBEC
40.0000 mg | DELAYED_RELEASE_TABLET | Freq: Every day | ORAL | Status: DC
  Administered 2012-01-17 – 2012-01-21 (×5): 40 mg via ORAL
  Filled 2012-01-16 (×6): qty 1

## 2012-01-16 MED ORDER — SODIUM CHLORIDE 0.9 % IV SOLN: 250.0000 mL | INTRAVENOUS | Status: DC | PRN

## 2012-01-16 MED ORDER — SODIUM CHLORIDE 0.9 % IJ SOLN: 3.0000 mL | INTRAMUSCULAR | Status: DC | PRN

## 2012-01-16 MED ORDER — METOPROLOL SUCCINATE ER 25 MG PO TB24
25.0000 mg | ORAL_TABLET | Freq: Every day | ORAL | Status: DC
  Administered 2012-01-17 – 2012-01-20 (×4): 25 mg via ORAL
  Filled 2012-01-16 (×5): qty 1

## 2012-01-16 MED ORDER — ALPRAZOLAM 0.25 MG PO TABS: 0.2500 mg | ORAL_TABLET | Freq: Two times a day (BID) | ORAL | Status: DC | PRN

## 2012-01-16 MED ORDER — DOCUSATE SODIUM 100 MG PO CAPS
200.0000 mg | ORAL_CAPSULE | Freq: Two times a day (BID) | ORAL | Status: DC
  Administered 2012-01-17 – 2012-01-20 (×7): 200 mg via ORAL
  Filled 2012-01-16 (×12): qty 2

## 2012-01-16 MED ORDER — ATORVASTATIN CALCIUM 10 MG PO TABS
10.0000 mg | ORAL_TABLET | Freq: Every day | ORAL | Status: DC
  Administered 2012-01-17 – 2012-01-21 (×5): 10 mg via ORAL
  Filled 2012-01-16 (×8): qty 1

## 2012-01-16 MED ORDER — ASPIRIN 81 MG PO CHEW
324.0000 mg | CHEWABLE_TABLET | ORAL | Status: DC
  Filled 2012-01-16: qty 4

## 2012-01-16 MED ORDER — NITROGLYCERIN 0.4 MG SL SUBL: 0.4000 mg | SUBLINGUAL_TABLET | SUBLINGUAL | Status: DC | PRN

## 2012-01-16 MED ORDER — ASPIRIN 81 MG PO CHEW
324.0000 mg | CHEWABLE_TABLET | Freq: Once | ORAL | Status: AC
  Administered 2012-01-16: 324 mg via ORAL
  Filled 2012-01-16: qty 4

## 2012-01-16 MED ORDER — HEPARIN (PORCINE) IN NACL 100-0.45 UNIT/ML-% IJ SOLN
1350.0000 [IU]/h | INTRAMUSCULAR | Status: DC
  Administered 2012-01-16: 1200 [IU]/h via INTRAVENOUS
  Filled 2012-01-16 (×2): qty 250

## 2012-01-16 MED ORDER — ONDANSETRON HCL 4 MG/2ML IJ SOLN: 4.0000 mg | Freq: Four times a day (QID) | INTRAMUSCULAR | Status: DC | PRN

## 2012-01-16 MED ORDER — ACETAMINOPHEN 325 MG PO TABS
650.0000 mg | ORAL_TABLET | ORAL | Status: DC | PRN
  Administered 2012-01-17: 650 mg via ORAL
  Filled 2012-01-16: qty 2

## 2012-01-16 MED ORDER — NITROGLYCERIN 0.4 MG SL SUBL
0.4000 mg | SUBLINGUAL_TABLET | SUBLINGUAL | Status: DC | PRN
  Administered 2012-01-16 (×2): 0.4 mg via SUBLINGUAL
  Filled 2012-01-16: qty 25

## 2012-01-16 MED ORDER — SODIUM CHLORIDE 0.9 % IJ SOLN
3.0000 mL | Freq: Two times a day (BID) | INTRAMUSCULAR | Status: DC
  Administered 2012-01-16 – 2012-01-21 (×8): 3 mL via INTRAVENOUS

## 2012-01-16 MED ORDER — POLYETHYLENE GLYCOL 3350 17 G PO PACK
17.0000 g | PACK | Freq: Every day | ORAL | Status: DC
  Administered 2012-01-18 – 2012-01-20 (×3): 17 g via ORAL
  Filled 2012-01-16 (×5): qty 1

## 2012-01-16 MED ORDER — TECHNETIUM TO 99M ALBUMIN AGGREGATED
3.9000 | Freq: Once | INTRAVENOUS | Status: AC | PRN
  Administered 2012-01-16: 3.9 via INTRAVENOUS

## 2012-01-16 MED ORDER — NITROGLYCERIN IN D5W 200-5 MCG/ML-% IV SOLN
5.0000 ug/min | INTRAVENOUS | Status: DC
  Administered 2012-01-16: 5 ug/min via INTRAVENOUS
  Filled 2012-01-16: qty 250

## 2012-01-16 MED ORDER — SODIUM CHLORIDE 0.9 % IJ SOLN
3.0000 mL | Freq: Two times a day (BID) | INTRAMUSCULAR | Status: DC
  Administered 2012-01-16: 3 mL via INTRAVENOUS

## 2012-01-16 MED ORDER — AMLODIPINE BESYLATE 10 MG PO TABS
10.0000 mg | ORAL_TABLET | Freq: Every day | ORAL | Status: DC
  Administered 2012-01-17: 10 mg via ORAL
  Filled 2012-01-16 (×2): qty 1

## 2012-01-16 MED ORDER — DIAZEPAM 5 MG PO TABS
5.0000 mg | ORAL_TABLET | ORAL | Status: AC
  Administered 2012-01-17: 5 mg via ORAL
  Filled 2012-01-16: qty 1

## 2012-01-16 MED ORDER — HYDROCODONE-ACETAMINOPHEN 10-325 MG PO TABS
1.0000 | ORAL_TABLET | Freq: Four times a day (QID) | ORAL | Status: DC | PRN
  Administered 2012-01-16 – 2012-01-21 (×9): 1 via ORAL
  Filled 2012-01-16 (×10): qty 1

## 2012-01-16 MED ORDER — METOCLOPRAMIDE HCL 5 MG PO TABS
5.0000 mg | ORAL_TABLET | Freq: Four times a day (QID) | ORAL | Status: DC | PRN
  Filled 2012-01-16: qty 1

## 2012-01-16 MED ORDER — EZETIMIBE 10 MG PO TABS
10.0000 mg | ORAL_TABLET | Freq: Every day | ORAL | Status: DC
  Administered 2012-01-18 – 2012-01-21 (×4): 10 mg via ORAL
  Filled 2012-01-16 (×5): qty 1

## 2012-01-16 MED ORDER — ZOLPIDEM TARTRATE 5 MG PO TABS
5.0000 mg | ORAL_TABLET | Freq: Every evening | ORAL | Status: DC | PRN
  Administered 2012-01-19 – 2012-01-20 (×2): 5 mg via ORAL
  Filled 2012-01-16 (×2): qty 1

## 2012-01-16 MED ORDER — VITAMIN D3 25 MCG (1000 UNIT) PO TABS
1000.0000 [IU] | ORAL_TABLET | Freq: Every day | ORAL | Status: DC
  Administered 2012-01-18 – 2012-01-21 (×4): 1000 [IU] via ORAL
  Filled 2012-01-16 (×5): qty 1

## 2012-01-16 MED ORDER — SODIUM CHLORIDE 0.9 % IV SOLN
INTRAVENOUS | Status: DC
  Administered 2012-01-16: via INTRAVENOUS

## 2012-01-16 MED ORDER — SENNA 8.6 MG PO TABS
1.0000 | ORAL_TABLET | Freq: Every day | ORAL | Status: DC
  Administered 2012-01-18 – 2012-01-20 (×3): 8.6 mg via ORAL
  Filled 2012-01-16 (×4): qty 1

## 2012-01-16 NOTE — ED Provider Notes (Signed)
History     CSN: [id number]  Arrival date & time 01/16/12  1103   First MD Initiated Contact with Patient 01/16/12 1128      Chief Complaint  Patient presents with  . Chest Pain    (Consider location/radiation/quality/duration/timing/severity/associated sxs/prior treatment) HPI Complains of anterior left-sided chest pain nonradiating onset February 2013 pleuritic in quality treated with nitroglycerin with partial improvement accompanied by cold chills. No nausea no sweatiness. No other complaint. Pain is worse with exertion improved with rest also worse with deep inspiration. Nonradiating Past Medical History  Diagnosis Date  . GERD (gastroesophageal reflux disease)   . Hypertension   . Diabetes mellitus     diet controlled  . Morbid obesity   . Arthritis   . Chronic kidney disease     nephrolithiasis left kidney,s/p open resection  . Status post arthroscopic surgery of right knee   . Diverticulosis of sigmoid colon   . Chronic back pain   . Pneumonia 07/2011    hx  . Pulmonary embolism 07/2011    hx  . Uterine cancer     endometrial high grade adenocarcinoma  . Uterus cancer, sarcoma     high grade invasive carcinosarcoma  . Myocardial infarction 1989  . Bronchitis   . H/O cardiomegaly   . Lymphedema 09/29/2011  . Diabetes mellitus, diet controlled 10/03/2011  . Chronic kidney disease, stage II (mild) 09/20/2011  . Acute diastolic congestive heart failure, 2D Oct 2012 09/29/2011  . Presence of IVC filter 01/08/2012  . History of colon polyps 01/08/2012   Congestive heart failure Past Surgical History  Procedure Date  . Colonoscopy     negative  . Knee arthroscopy     right knee  . Cataract extraction, bilateral   . Tubal ligation 1973  . Abdominal hysterectomy 10/05/2008    total with b/l salpingo-oopherectomy  . Kidney stone removal   . Open left knee cartilage repair   . Left index finger surgery     Family History  Problem Relation Age of Onset  .  Breast cancer Sister     1st sister  . Colon cancer Sister     2nd sister  . Cervical cancer Sister     3rd sister    History  Substance Use Topics  . Smoking status: Never Smoker   . Smokeless tobacco: Never Used  . Alcohol Use: No    OB History    Grav Para Term Preterm Abortions TAB SAB Ect Mult Living   9 5              Review of Systems  Constitutional: Negative.   HENT: Negative.   Respiratory: Positive for chest tightness.   Cardiovascular: Positive for chest pain and leg swelling.       Bilateral leg edema for several years, unchanged  Gastrointestinal: Negative.   Musculoskeletal: Negative.   Skin: Negative.   Neurological: Negative.   Hematological: Negative.   Psychiatric/Behavioral: Negative.   All other systems reviewed and are negative.    Allergies  Review of patient's allergies indicates no known allergies.  Home Medications   Current Outpatient Rx  Name Route Sig Dispense Refill  . AMLODIPINE BESYLATE 10 MG PO TABS Oral Take 10 mg by mouth daily.    [name] VITAMIN D 1000 UNITS PO TABS Oral Take 1,000 Units by mouth daily.    [name] DOCUSATE SODIUM 100 MG PO CAPS Oral Take 200 mg by mouth 2 (two) times daily. For diarrhea    .  EZETIMIBE 10 MG PO TABS Oral Take 10 mg by mouth daily.     . FUROSEMIDE 40 MG PO TABS Oral Take 1 tablet (40 mg total) by mouth daily. 30 tablet   . HYDROCODONE-ACETAMINOPHEN 10-325 MG PO TABS Oral Take 1 tablet by mouth every 6 (six) hours as needed. For pain.     [name] METOPROLOL SUCCINATE ER 25 MG PO TB24 Oral Take 25 mg by mouth daily.      [name] NITROGLYCERIN 0.4 MG SL SUBL Sublingual Place 0.4 mg under the tongue every 5 (five) minutes as needed. For chest pain. Do not exceed 3 tablets. If pain is not resolved after 3rd tablet call 911.    [name] OMEPRAZOLE 20 MG PO CPDR Oral Take 20 mg by mouth daily.    [name] POLYETHYLENE GLYCOL 3350 PO PACK Oral Take 17 g by mouth daily.    . SENNA 8.6 MG PO TABS Oral Take 1 tablet by mouth.    . ZOLPIDEM  TARTRATE 5 MG PO TABS Oral Take 5 mg by mouth at bedtime as needed. For sleep.    [name] METOCLOPRAMIDE HCL 5 MG PO TABS Oral Take 1 tablet (5 mg total) by mouth 4 (four) times daily as needed.      BP 130/51  Pulse 121  Temp(Src) 98.6 F (37 C) (Oral)  Resp 22  SpO2 98%  Physical Exam  Nursing note and vitals reviewed. Constitutional: She appears well-developed and well-nourished.  HENT:  Head: Normocephalic and atraumatic.  Eyes: Conjunctivae are normal. Pupils are equal, round, and reactive to light.  Neck: Neck supple. No tracheal deviation present. No thyromegaly present.  Cardiovascular: Normal rate and regular rhythm.   No murmur heard. Pulmonary/Chest: Effort normal and breath sounds normal.       Mildly tender anteriorly left chest, reproducing pain  Abdominal: Soft. Bowel sounds are normal. She exhibits no distension. There is no tenderness.  Musculoskeletal: Normal range of motion. She exhibits edema. She exhibits no tenderness.       3+ pretibial pitting edema bilaterally  Neurological: She is alert. Coordination normal.  Skin: Skin is warm and dry. No rash noted.  Psychiatric: She has a normal mood and affect.    ED Course  Procedures (including critical care time) 3:57 PM  resting comfortably, pain-free only complains of chest pain at the end of a deep inspiration. Spoke with Nada Boozer from [facility] heart center will come to evaluate patient Labs Reviewed - No data to display No results found. Results for orders placed during the hospital encounter of 01/16/12  BASIC METABOLIC PANEL      Component Value Range   Sodium 130 (*) 135 - 145 (mEq/L)   Potassium 4.5  3.5 - 5.1 (mEq/L)   Chloride 91 (*) 96 - 112 (mEq/L)   CO2 27  19 - 32 (mEq/L)   Glucose, Bld 156 (*) 70 - 99 (mg/dL)   BUN 43 (*) 6 - 23 (mg/dL)   Creatinine, Ser 0.45 (*) 0.50 - 1.10 (mg/dL)   Calcium 9.7  8.4 - 40.9 (mg/dL)   GFR calc non Af Amer 21 (*) >90 (mL/min)   GFR calc Af Amer 24 (*) >90  (mL/min)  CBC      Component Value Range   WBC 17.2 (*) 4.0 - 10.5 (K/uL)   RBC 3.55 (*) 3.87 - 5.11 (MIL/uL)   Hemoglobin 10.5 (*) 12.0 - 15.0 (g/dL)   HCT 81.1 (*) 91.4 - 46.0 (%)   MCV 92.1  78.0 -  100.0 (fL)   MCH 29.6  26.0 - 34.0 (pg)   MCHC 32.1  30.0 - 36.0 (g/dL)   RDW 16.1 (*) 09.6 - 15.5 (%)   Platelets 607 (*) 150 - 400 (K/uL)  DIFFERENTIAL      Component Value Range   Neutrophils Relative 78 (*) 43 - 77 (%)   Lymphocytes Relative 11 (*) 12 - 46 (%)   Monocytes Relative 9  3 - 12 (%)   Eosinophils Relative 1  0 - 5 (%)   Basophils Relative 1  0 - 1 (%)   Neutro Abs 13.4 (*) 1.7 - 7.7 (K/uL)   Lymphs Abs 1.9  0.7 - 4.0 (K/uL)   Monocytes Absolute 1.5 (*) 0.1 - 1.0 (K/uL)   Eosinophils Absolute 0.2  0.0 - 0.7 (K/uL)   Basophils Absolute 0.2 (*) 0.0 - 0.1 (K/uL)   WBC Morphology WHITE COUNT CONFIRMED ON SMEAR     Smear Review PLATELET COUNT CONFIRMED BY SMEAR    POCT I-STAT TROPONIN I      Component Value Range   Troponin i, poc 0.04  0.00 - 0.08 (ng/mL)   Comment 3            Nm Pulmonary Perfusion  01/16/2012  *RADIOLOGY REPORT*  Clinical Data: Chest pain.  History of previous pulmonary emboli  NM PULMONARY PERFUSION PARTICULATE  Radiopharmaceutical: 3.9MILLI CURIE MAA TECHNETIUM TO 36M ALBUMIN AGGREGATED  Comparison: Chest radiography same day.  Findings: Diffusion imaging shows a very minimal patchy pattern but no frank defect.  In the setting of mild pulmonary edema, the study predicts a low probability of pulmonary embolism.  IMPRESSION: Low probability of pulmonary embolism.  Original Report Authenticated By: Thomasenia Sales, M.D.   Dg Chest Port 1 View  01/16/2012  *RADIOLOGY REPORT*  Clinical Data: Chest pain, shortness of breath, weakness, hypertension, diabetes, history uterine cancer, PE, and MI  PORTABLE CHEST - 1 VIEW  Comparison: Portable exam 1233 hours compared to 12/03/2011  Findings: Enlargement of cardiac silhouette with pulmonary vascular congestion.  Perihilar infiltrates question pulmonary edema/CHF. No definite pleural effusion or pneumothorax. Bones appear demineralized.  IMPRESSION: Probable CHF.  Original Report Authenticated By: Lollie Marrow, M.D.     No diagnosis found.    Date: 01/16/2012  Rate: 120  Rhythm: sinus tachycardia  QRS Axis: left  Intervals: normal  ST/T Wave abnormalities: ST depressions laterally  Conduction Disutrbances:none  Narrative Interpretation:   Old EKG Reviewed: changes noted Lateral ischemic changes new from 12/03/2011 MDM  Cardiology service to make final disposition  Pretest clinical probability for acute pulmonary embolism is low given that patient has IVC filter   diagnosis #1 chest pain #2 hyperglycemia #3 renal insufficiency        Doug Sou, MD 01/16/12 1601

## 2012-01-16 NOTE — Progress Notes (Signed)
Pt with b/p of 88/46. Pa called and informed. Also informed pt still with chest pain 8/10. PA stated to hold nitro and give prn pain meds when due. Pt informed. Will continue to monitor.

## 2012-01-16 NOTE — Progress Notes (Signed)
ANTICOAGULATION CONSULT NOTE - Initial Consult  Pharmacy Consult for IV Heparin Indication: ACS  No Known Allergies  Patient Measurements:   Last body weight (01/08/12): 134.4 kg Height: 168 cm IBW: 59.6 kg Heparin Dosing Weight: 92.4 kg  Vital Signs: Temp: 98.3 F (36.8 C) (04/03 1818) Temp src: Oral (04/03 1818) BP: 104/42 mmHg (04/03 1818) Pulse Rate: 99  (04/03 1818)  Labs:  Basename 01/16/12 1200  HGB 10.5*  HCT 32.7*  PLT 607*  APTT --  LABPROT --  INR --  HEPARINUNFRC --  CREATININE 2.14*  CKTOTAL --  CKMB --  TROPONINI --   The CrCl is unknown because both a height and weight (above a minimum accepted value) are required for this calculation.  Medical History: Past Medical History  Diagnosis Date  . GERD (gastroesophageal reflux disease)   . Hypertension   . Diabetes mellitus     diet controlled  . Morbid obesity   . Arthritis   . Chronic kidney disease     nephrolithiasis left kidney,s/p open resection  . Status post arthroscopic surgery of right knee   . Diverticulosis of sigmoid colon   . Chronic back pain   . Pneumonia 07/2011    hx  . Pulmonary embolism 07/2011    hx  . Uterine cancer     endometrial high grade adenocarcinoma  . Uterus cancer, sarcoma     high grade invasive carcinosarcoma  . Myocardial infarction 1989  . Bronchitis   . H/O cardiomegaly   . Lymphedema 09/29/2011  . Diabetes mellitus, diet controlled 10/03/2011  . Chronic kidney disease, stage II (mild) 09/20/2011  . Acute diastolic congestive heart failure, 2D Oct 2012 09/29/2011  . Presence of IVC filter 01/08/2012  . History of colon polyps 01/08/2012  . Chest pain 01/16/2012  . Aortic stenosis, moderate 01/16/2012   Assessment: 80yof presenting with s/sx's of ACS with ECG changes.  First troponin <0.30.  PE still part of differential diagnosis in this patient with IVC filter (no warfarin PTA).  SCr 2.14 (baseline 1.9), CrCl~45 ml/min.   Goal of Therapy:  Heparin  level 0.3-0.7 units/ml   Plan:  Start IV heparin with 4000 unit bolus followed by an infusion at 1200 units/hr.  Check heparin level 8 hours after infusion started. Daily CBC and heparin level.  Clance Boll 01/16/2012,6:53 PM

## 2012-01-16 NOTE — H&P (Signed)
I have seen and examined the patient along with Nada Boozer, NP.  I have reviewed the chart, notes and new data.  I agree with NP's H&P note.  Key new complaints: recurrent chest pain resolves promptly with NTG Key examination changes: loud systolic murmur(s?) - definitely AS and probably TR murmurs. Key new findings / data: enzymes are normal; ECG shows new ST-T changes in I and aVL.   PLAN: Appears to have an acute coronary syndrome with ECG changes. Major differential diagnosis is still pulmonary embolism in a patient with IVC filter not on warfarin, notwithstanding the VQ results. There are no noninvasive methods to evaluate her for CAD that would be appropriate due to her body habitus. Only available method is coronary angio via radial approach.  Her renal function is abnormal, but appears to be close to baseline (creat. lowest has been 1.9). Hydrate overnight. Hold diuretics. She seems to tolerate SL NTG repeatedly without hypotension, so IV NTG should be safe with her aortic stenosis. Note that for her body size an aortic valve area of 1.2 cm sq is moderate to severe.   Thurmon Fair, MD, [facility] [phone] 01/16/2012, 6:14 PM

## 2012-01-16 NOTE — ED Notes (Signed)
Pt states she is having generalized chest pain. Pt states she started to have chest pain when she takes a deep breath and on exertion. Pt denies any pressure or emesis.pt denies numbness or jaw or back pain

## 2012-01-16 NOTE — Progress Notes (Signed)
Pt up on floor from ED. Pt with b/p of 118/43. Orders to start nitro drip. Called PA on call and stated to start nitro drip and if pt's b/p goes <90 stop nitro drip. Will continue to monitor.

## 2012-01-16 NOTE — H&P (Signed)
Reason for Consult: chest pain   Referring Physician: Dr. Ethelda Chick  ER MD   Jacqulyn Cane Christine [name] is an 78 y.o. female.   Chief Complaint: chest pain   HPI: 78 year old morbidly obese, divorced,  African American  Female Presents to the emergency room today secondary to chest pain. Her pain has been going on during her stay in rehabilitation and has continued since she came home from rehabilitation.  They were giving her nitroglycerin sublingual at rehabilitation and since she's been home she took 2 this past Saturday.  But today the pain returned. It is described as sharp and all occurring in the left anterior chest without radiation she has some nausea associated with it, occasional shortness of breath, no diaphoresis.  Here in the emergency room she has had 4 baby aspirin but she continues with chest pain we will attempt to use nitroglycerin sublingual.  She has had a VQ scan today which was negative for pulmonary embolus.  The 2nd NTG relieved her chest pain.  She has a history of labile hypertension, hyperlipidemia, chronic lymphedema, as well as mild to moderate aortic stenosis by 2D Echo with a valve area measuring approximately 1.2 cm square.   Other history includes pulmonary embolus in the past and was placed on Coumadin for anticoagulation but did not tolerate the Coumadin with cerebral GI bleeding with supra-therapeutic INR. Her Coumadin had been discontinued and an IV Filter was placed.  She was last seen by Dr. Allyson Sabal 10/31/2011 at which time her greatest complaint was instability on her feet and some dizziness.  She has been hospitalized several times in December 2012, January 2013, February 2013. Some of these visits are acute on chronic renal failure though in February she was diagnosed with atypical chest pain.  She had been placed in rehabilitation secondary to weakness after being admitted with chest pain.  Past Medical History  Diagnosis Date  . GERD (gastroesophageal reflux  disease)   . Hypertension   . Diabetes mellitus     diet controlled  . Morbid obesity   . Arthritis   . Chronic kidney disease     nephrolithiasis left kidney,s/p open resection  . Status post arthroscopic surgery of right knee   . Diverticulosis of sigmoid colon   . Chronic back pain   . Pneumonia 07/2011    hx  . Pulmonary embolism 07/2011    hx  . Uterine cancer     endometrial high grade adenocarcinoma  . Uterus cancer, sarcoma     high grade invasive carcinosarcoma  . Myocardial infarction 1989  . Bronchitis   . H/O cardiomegaly   . Lymphedema 09/29/2011  . Diabetes mellitus, diet controlled 10/03/2011  . Chronic kidney disease, stage II (mild) 09/20/2011  . Acute diastolic congestive heart failure, 2D Oct 2012 09/29/2011  . Presence of IVC filter 01/08/2012  . History of colon polyps 01/08/2012  . Chest pain 01/16/2012  . Aortic stenosis, moderate 01/16/2012    Past Surgical History  Procedure Date  . Colonoscopy     negative  . Knee arthroscopy     right knee  . Cataract extraction, bilateral   . Tubal ligation 1973  . Abdominal hysterectomy 10/05/2008    total with b/l salpingo-oopherectomy  . Kidney stone removal   . Open left knee cartilage repair   . Left index finger surgery     Family History  Problem Relation Age of Onset  . Breast cancer Sister     1st sister  .  Colon cancer Sister     2nd sister  . Cervical cancer Sister     3rd sister   Social History:  reports that she has never smoked. She has never used smokeless tobacco. She reports that she does not drink alcohol or use illicit drugs. Lives at home with her son.  Allergies: No Known Allergies  Medications Prior to Admission  Medication Dose Route Frequency Provider Last Rate Last Dose  . aspirin chewable tablet 324 mg  324 mg Oral Once Doug Sou, MD   324 mg at 01/16/12 1214  . nitroGLYCERIN (NITROSTAT) SL tablet 0.4 mg  0.4 mg Sublingual Q5 min PRN Nada Boozer, NP   0.4 mg at  01/16/12 1817  . technetium albumin aggregated (MAA) injection solution 4 milli Curie  4 milli Curie Intravenous Once PRN Medication Radiologist, MD   3.9 milli Curie at 01/16/12 1400   Medications Prior to Admission  Medication Sig Dispense Refill  . ezetimibe (ZETIA) 10 MG tablet Take 10 mg by mouth daily.       . furosemide (LASIX) 40 MG tablet Take 1 tablet (40 mg total) by mouth daily.  30 tablet    . HYDROcodone-acetaminophen (NORCO) 10-325 MG per tablet Take 1 tablet by mouth every 6 (six) hours as needed. For pain.       . metoprolol succinate (TOPROL-XL) 25 MG 24 hr tablet Take 25 mg by mouth daily.        . nitroGLYCERIN (NITROSTAT) 0.4 MG SL tablet Place 0.4 mg under the tongue every 5 (five) minutes as needed. For chest pain. Do not exceed 3 tablets. If pain is not resolved after 3rd tablet call 911.      . omeprazole (PRILOSEC) 20 MG capsule Take 20 mg by mouth daily.      [name] senna (SENOKOT) 8.6 MG TABS Take 1 tablet by mouth.      . zolpidem (AMBIEN) 5 MG tablet Take 5 mg by mouth at bedtime as needed. For sleep.      . metoCLOPramide (REGLAN) 5 MG tablet Take 1 tablet (5 mg total) by mouth 4 (four) times daily as needed.        Results for orders placed during the hospital encounter of 01/16/12 (from the past 48 hour(s))  BASIC METABOLIC PANEL     Status: Abnormal   Collection Time   01/16/12 12:00 PM      Component Value Range Comment   Sodium 130 (*) 135 - 145 (mEq/L)    Potassium 4.5  3.5 - 5.1 (mEq/L)    Chloride 91 (*) 96 - 112 (mEq/L)    CO2 27  19 - 32 (mEq/L)    Glucose, Bld 156 (*) 70 - 99 (mg/dL)    BUN 43 (*) 6 - 23 (mg/dL)    Creatinine, Ser 4.01 (*) 0.50 - 1.10 (mg/dL)    Calcium 9.7  8.4 - 10.5 (mg/dL)    GFR calc non Af Amer 21 (*) >90 (mL/min)    GFR calc Af Amer 24 (*) >90 (mL/min)   CBC     Status: Abnormal   Collection Time   01/16/12 12:00 PM      Component Value Range Comment   WBC 17.2 (*) 4.0 - 10.5 (K/uL)    RBC 3.55 (*) 3.87 - 5.11 (MIL/uL)     Hemoglobin 10.5 (*) 12.0 - 15.0 (g/dL)    HCT 02.7 (*) 25.3 - 46.0 (%)    MCV 92.1  78.0 - 100.0 (fL)  MCH 29.6  26.0 - 34.0 (pg)    MCHC 32.1  30.0 - 36.0 (g/dL)    RDW 16.1 (*) 09.6 - 15.5 (%)    Platelets 607 (*) 150 - 400 (K/uL)   DIFFERENTIAL     Status: Abnormal   Collection Time   01/16/12 12:00 PM      Component Value Range Comment   Neutrophils Relative 78 (*) 43 - 77 (%)    Lymphocytes Relative 11 (*) 12 - 46 (%)    Monocytes Relative 9  3 - 12 (%)    Eosinophils Relative 1  0 - 5 (%)    Basophils Relative 1  0 - 1 (%)    Neutro Abs 13.4 (*) 1.7 - 7.7 (K/uL)    Lymphs Abs 1.9  0.7 - 4.0 (K/uL)    Monocytes Absolute 1.5 (*) 0.1 - 1.0 (K/uL)    Eosinophils Absolute 0.2  0.0 - 0.7 (K/uL)    Basophils Absolute 0.2 (*) 0.0 - 0.1 (K/uL)    WBC Morphology WHITE COUNT CONFIRMED ON SMEAR   MILD LEFT SHIFT (1-5% METAS, OCC MYELO, OCC BANDS)   Smear Review PLATELET COUNT CONFIRMED BY SMEAR     POCT I-STAT TROPONIN I     Status: Normal   Collection Time   01/16/12  2:31 PM      Component Value Range Comment   Troponin i, poc 0.04  0.00 - 0.08 (ng/mL)    Comment 3             Nm Pulmonary Perfusion  01/16/2012  *RADIOLOGY REPORT*  Clinical Data: Chest pain.  History of previous pulmonary emboli  NM PULMONARY PERFUSION PARTICULATE  Radiopharmaceutical: 3.9MILLI CURIE MAA TECHNETIUM TO 70M ALBUMIN AGGREGATED  Comparison: Chest radiography same day.  Findings: Diffusion imaging shows a very minimal patchy pattern but no frank defect.  In the setting of mild pulmonary edema, the study predicts a low probability of pulmonary embolism.  IMPRESSION: Low probability of pulmonary embolism.  Original Report Authenticated By: Thomasenia Sales, M.D.   Dg Chest Port 1 View  01/16/2012  *RADIOLOGY REPORT*  Clinical Data: Chest pain, shortness of breath, weakness, hypertension, diabetes, history uterine cancer, PE, and MI  PORTABLE CHEST - 1 VIEW  Comparison: Portable exam 1233 hours compared to 12/03/2011   Findings: Enlargement of cardiac silhouette with pulmonary vascular congestion. Perihilar infiltrates question pulmonary edema/CHF. No definite pleural effusion or pneumothorax. Bones appear demineralized.  IMPRESSION: Probable CHF.  Original Report Authenticated By: Lollie Marrow, M.D.    ROS: General:No colds or fevers Skin:No rashes or ulcers HEENT:No blurred vision, wears a wig EA:VWUJW pain is described PUL:No shortness of breath GI:No diarrhea, she has a lot of constipation, no melena GU:No hematuria or dysuria JX:BJYNWGN back pain, also complains of hand pain with cramping in her fingers Neuro:No syncope, no lightheadedness Endo:No diabetes or thyroid disorder   Blood pressure 104/42, pulse 99, temperature 98.3 F (36.8 C), temperature source Oral, resp. rate 16, SpO2 100.00%. PE: General:Alert oriented African American female with current chest pain Skin:Warm and dry, brisk capillary refill HEENT:Normocephalic, sclera clear Neck:No JVD no carotid bruits Heart:S1-S2 regular rate and rhythm with 2-3/6 systolic murmur Lungs:Clear with rales rhonchi or wheezes FAO:ZHYQM, soft, nontender, positive bowel sounds do not palpate liver spleen or masses VHQ:IONGEXBM lymphedema with 1+ edema Neuro:Alert and oriented x3, pulse commands moves all extremities.    Assessment/Plan Patient Active Problem List  Diagnoses  . Uterine cancer  . Chronic back pain  .  Pneumonia  . Pulmonary embolism, Oct 2012  . Chronic kidney disease, stage II (mild)  . Hypertension  . GERD (gastroesophageal reflux disease)  . Arthritis, on high dose steroids since 12/16 for toe pain  . Morbid obesity  . Leukocytosis  . Acute diastolic congestive heart failure, 2D Oct 2012  . Lymphedema  . Diabetes mellitus, diet controlled  . Acute on chronic renal failure  . UTI (lower urinary tract infection)  . AKI (acute kidney injury)  . Atypical chest pain  . Preventative health care  . Presence of IVC filter    . History of colon polyps  . Weakness generalized  . Bilateral hand pain  . Chest pain  . Aortic stenosis, moderate   PLAN: Admit, to stepdown, IV heparin, serial CKMB, IV NTG.  Plan for cardiac cath tomorrow.  Charliegh Vasudevan R 01/16/2012, 6:21 PM

## 2012-01-16 NOTE — ED Notes (Signed)
Pt  To VQ test

## 2012-01-16 NOTE — ED Notes (Signed)
Pt returned from VQ test

## 2012-01-16 NOTE — ED Notes (Signed)
Pt also reports constipation x 1 week.

## 2012-01-17 ENCOUNTER — Encounter (HOSPITAL_COMMUNITY)
Admission: EM | Disposition: A | Discharge status: Home / Self Care | Payer: Self-pay | Source: Home / Self Care | Attending: Cardiovascular Disease

## 2012-01-17 ENCOUNTER — Other Ambulatory Visit: Payer: Self-pay

## 2012-01-17 HISTORY — PX: LEFT HEART CATHETERIZATION WITH CORONARY ANGIOGRAM: SHX5451

## 2012-01-17 LAB — CBC
HCT: 27.7 % — ABNORMAL LOW (ref 36.0–46.0)
Hemoglobin: 8.8 g/dL — ABNORMAL LOW (ref 12.0–15.0)
MCH: 28.9 pg (ref 26.0–34.0)
MCHC: 31.8 g/dL (ref 30.0–36.0)
MCV: 90.8 fL (ref 78.0–100.0)
RBC: 3.05 MIL/uL — ABNORMAL LOW (ref 3.87–5.11)

## 2012-01-17 LAB — CARDIAC PANEL(CRET KIN+CKTOT+MB+TROPI)
CK, MB: 1.9 ng/mL (ref 0.3–4.0)
Total CK: 119 U/L (ref 7–177)

## 2012-01-17 LAB — BASIC METABOLIC PANEL
BUN: 41 mg/dL — ABNORMAL HIGH (ref 6–23)
GFR calc Af Amer: 27 mL/min — ABNORMAL LOW (ref >90)
GFR calc non Af Amer: 23 mL/min — ABNORMAL LOW (ref >90)
Potassium: 4 mEq/L (ref 3.5–5.1)

## 2012-01-17 LAB — LIPID PANEL
Cholesterol: 158 mg/dL (ref 0–200)
LDL Cholesterol: 105 mg/dL — ABNORMAL HIGH (ref 0–99)
Total CHOL/HDL Ratio: 5.9 RATIO
VLDL: 26 mg/dL (ref 0–40)

## 2012-01-17 LAB — APTT: aPTT: 80 seconds — ABNORMAL HIGH (ref 24–37)

## 2012-01-17 LAB — TSH: TSH: 2.388 u[IU]/mL (ref 0.350–4.500)

## 2012-01-17 LAB — GLUCOSE, CAPILLARY
Glucose-Capillary: 102 mg/dL — ABNORMAL HIGH (ref 70–99)
Glucose-Capillary: 104 mg/dL — ABNORMAL HIGH (ref 70–99)

## 2012-01-17 LAB — PROTIME-INR
INR: 1.19 (ref 0.00–1.49)
Prothrombin Time: 15.4 seconds — ABNORMAL HIGH (ref 11.6–15.2)
Prothrombin Time: 14.7 seconds (ref 11.6–15.2)

## 2012-01-17 LAB — MRSA PCR SCREENING: MRSA by PCR: NEGATIVE

## 2012-01-17 SURGERY — LEFT HEART CATHETERIZATION WITH CORONARY ANGIOGRAM
Anesthesia: LOCAL

## 2012-01-17 MED ORDER — BIOTENE DRY MOUTH MT LIQD
15.0000 mL | Freq: Two times a day (BID) | OROMUCOSAL | Status: DC
  Administered 2012-01-17 – 2012-01-21 (×6): 15 mL via OROMUCOSAL

## 2012-01-17 MED ORDER — ACETAMINOPHEN 325 MG PO TABS: 650.0000 mg | ORAL_TABLET | ORAL | Status: DC | PRN

## 2012-01-17 MED ORDER — ONDANSETRON HCL 4 MG/2ML IJ SOLN: 4.0000 mg | Freq: Four times a day (QID) | INTRAMUSCULAR | Status: DC | PRN

## 2012-01-17 MED ORDER — SODIUM CHLORIDE 0.9 % IV SOLN: INTRAVENOUS | Status: DC

## 2012-01-17 NOTE — Progress Notes (Signed)
ANTICOAGULATION CONSULT NOTE - Follow Up Consult  Pharmacy Consult for Heparin Indication: chest pain/ACS  No Known Allergies  Patient Measurements: Height: 5\' 6"  (167.6 cm) Weight: 292 lb 12.3 oz (132.8 kg) IBW/kg (Calculated) : 59.3  Heparin Dosing Weight:   Vital Signs: Temp: 98.2 F (36.8 C) (04/03 2235) Temp src: Oral (04/03 2235) BP: 92/40 mmHg (04/04 0022) Pulse Rate: 88  (04/03 2235)  Labs:  Basename 01/17/12 0300 01/17/12 0121 01/16/12 1930 01/16/12 1755 01/16/12 1200  HGB 8.8* -- -- -- 10.5*  HCT 27.7* -- -- -- 32.7*  PLT 571* -- -- -- 607*  APTT -- 80* 44* -- --  LABPROT 14.7 15.4* 15.2 -- --  INR 1.13 1.19 1.18 -- --  HEPARINUNFRC 0.10* -- -- -- --  CREATININE 1.95* -- -- -- 2.14*  CKTOTAL -- 119 -- 148 --  CKMB -- 1.9 -- 1.9 --  TROPONINI -- <0.30 -- <0.30 --   Estimated Creatinine Clearance: 33.3 ml/min (by C-G formula based on Cr of 1.95).   Medications:  Infusions:    . sodium chloride 50 mL/hr at 01/16/12 2011  . sodium chloride 75 mL/hr at 01/16/12 2331  . heparin 1,200 Units/hr (01/16/12 2012)  . nitroGLYCERIN Stopped (01/16/12 2332)    Assessment: Patient with low heparin level.  No issues with drip per RN.  MD aware of low Hgb.  Goal of Therapy:  Heparin level 0.3-0.7 units/ml   Plan:  Increase heparin to 1350 units/hr, recheck level at [address] [place] 01/17/2012,4:11 AM

## 2012-01-17 NOTE — Op Note (Signed)
[facility]     CARDIAC CATHETERIZATION REPORT  Christine [name]   [id number] 13-May-1931  Performing Cardiologist: Chrystie Nose Primary Physician: Oliver Barre, MD, MD Primary Cardiologist:  Dr. Allyson Sabal  Procedures Performed:  Left Heart Catheterization via 5 Fr right femoral artery access  Indication(s): chest pain, t-wave inversions  History: 78 y.o. female morbidly obese, divorced, African American, Presents to the emergency room today secondary to chest pain. Her pain has been going on during her stay in rehabilitation and has continued since she came home from rehabilitation. They were giving her nitroglycerin sublingual at rehabilitation and since she's been home she took 2 this past Saturday. But today the pain returned. It is described as sharp and all occurring in the left anterior chest without radiation she has some nausea associated with it, occasional shortness of breath, no diaphoresis. Here in the emergency room she has had 4 baby aspirin but she continues with chest pain we will attempt to use nitroglycerin sublingual. She has had a VQ scan today which was negative for pulmonary embolus. The 2nd NTG relieved her chest pain. She has a history of labile hypertension, hyperlipidemia, chronic lymphedema, as well as mild to moderate aortic stenosis by 2D Echo with a valve area measuring approximately 1.2 cm square. Other history includes pulmonary embolus in the past and was placed on Coumadin for anticoagulation but did not tolerate the Coumadin with cerebral GI bleeding with supra-therapeutic INR. Her Coumadin had been discontinued and an IV Filter was placed. She was last seen by Dr. Allyson Sabal 10/31/2011 at which time her greatest complaint was instability on her feet and some dizziness. She has been hospitalized several times in December 2012, January 2013, February 2013. Some of these visits are acute on chronic renal failure though in February she was diagnosed  with atypical chest pain. She had been placed in rehabilitation secondary to weakness after being admitted with chest pain. She is now referred for cardiac cath.  Consent: The procedure with Risks/Benefits/Alternatives and Indications was reviewed with the patient.  All questions were answered.    Risks / Complications include, but not limited to: Death, MI, CVA/TIA, VF/VT (with defibrillation), Bradycardia (need for temporary pacer placement), contrast induced nephropathy, bleeding / bruising / hematoma / pseudoaneurysm, vascular or coronary injury (with possible emergent CT or Vascular Surgery), adverse medication reactions, infection.    The patient and family voice understanding and agree to proceed.    Risks of procedure as well as the alternatives and risks of each were explained to the (patient/caregiver).  Consent for procedure obtained. Consent for signed by MD and patient with RN witness -- placed on chart.  Procedure: The patient was brought to the 2nd Floor [facility] in the fasting state and prepped and draped in the usual sterile fashion for (Right groin) access.  A modified Allen's test with plethysmography was performed on the right wrist demonstrating inadequate Ulnar Artery collateral flow, therefore no radial approach could be used.    Sterile technique was used including antiseptics, cap, gloves, gown, hand hygiene, mask and sheet.  Skin prep: Chlorhexidine;  Time Out: Verified patient identification, verified procedure, site/side was marked, verified correct patient position, special equipment/implants available, medications/allergies/relevent history reviewed, required imaging and test results available.  Performed  The right femoral head was identified using tactile and fluoroscopic technique.  The right groin was anesthetized with 1% subcutaneous Lidocaine.  The right Common Femoral Artery was accessed using a smart ultrasound  needle and the  Modified Seldinger Technique.  The artery was not backwalled. A 5 Fr sheath was placed using the Seldinger technique.  The sheath was aspirated and flushed.  A 5 Fr JL4 Catheter was advanced of over a Standard J wire into the ascending Aorta.  The catheter was used to engage the left coronary artery.  Multiple cineangiographic views of the left coronary artery system(s) were performed. A 5 Fr JR4 Catheter was advanced of over a Safety J wire into the ascending Aorta.  The catheter was used to engage the right coronary artery and cross the aortic valve.  Multiple cineangiographic views of the right coronary artery system(s) were performed. LV hemodynamics were measured and the catheter was pulled back across the Aortic Valve for measurement of "pull-back" gradient.  The catheter and the wire was removed completely out of the body.  A groin injection of contrast through the sheath was performed to assess for sheath closure. While the arteriotomy is at the appropriate level, there is a high bifurcation and manual closure of the artery was recommended.  The patient was transferred to the holding area where the sheath was removed after ACT checked with manual pressure held for hemostasis.  The arteriotomy site was marked with an "X" approximately 1 cm above the skin.  This is where I suggest holding pressure against the pelvic brim to compress the artery.  She must be monitored closesly for post-procedure bleeding due to her obesity.  The patient was transported to the cath lab holding area in stable condition.   The patient  was stable before, during and following the procedure.   Patient did tolerate procedure well. There were not complications.  EBL: <5 cc  Medications:  Premedication: 5 mg  Valium  Sedation: 0.5 mg IV Versed, 25 IV mcg Fentanyl  Contrast:  40 cc Omnipaque   Hemodynamics:  Central Aortic Pressure / Mean Aortic Pressure: 114/61  LV Pressure / LV End diastolic Pressure:  8  Aortic  valve Pullback gradient: 8-10 mmHg  Coronary Angiographic Data:  Left Main:  Calcified, no hemodynamically significant stenosis  Left Anterior Descending (LAD):  Mild luminal irregularities, it reaches the apex  1st diagonal (D1):  Smaller vessel without significant stenosis  Circumflex (LCx):  Larger vessel with mild luminal irregularities.  1st obtuse marginal:  High take-off of a large vessel which take a "ramus" course, no significant stenosis.  2nd obtuse marginal:  Smaller distal vessel  Right Coronary Artery: Large, dominant vessel with mild tubular proximal stenosis up to 30%  right ventricle branch of right coronary artery: No significant stenosis  posterior descending artery: No significant stenosis  posterior lateral branch:  No significant stenosis  Impression: 1.  Mild proximal tubular RCA stenosis up to 30%, otherwise no significant obstructive CAD 2.  The aortic valve did not appear calcified and was easily crossed with a JR4 catheter. There was a small 8 mmHg pullback gradient. 3.  LVEDP = 8 mmHg, which is low for a patient with moderate aortic stenosis.  Plan: 1.  Medical management and work-up of non-cardiac causes of chest pain. 2.  Question severity of reported aortic stenosis.  The case and results was discussed with the patient (and family). The case and results was not discussed with the patient's PCP. The case and results was discussed with the patient's Cardiologist.  Time Spend Directly with Patient:  45 minutes  Chrystie Nose, MD, [facility] Attending Cardiologist [facility]  Glorious Flicker  C 01/17/2012, 10:59 AM

## 2012-01-17 NOTE — Progress Notes (Signed)
Called [facility] spoke with bridget to inform pt will need transport to cath lab in the am.

## 2012-01-17 NOTE — Progress Notes (Deleted)
Pt c/o sore throat. Refer to orders.

## 2012-01-17 NOTE — Progress Notes (Signed)
UR Completed. Simmons, Janne Faulk F [phone]  

## 2012-01-17 NOTE — Progress Notes (Signed)
eLink Physician-Brief Progress Note Patient Name: Christine [name] DOB: Mar 08, 1931 MRN: [id number]  Date of Service  01/17/2012   HPI/Events of Note   Lab 01/17/12 0300 01/16/12 1200  HGB 8.8* 10.5*     Lab 01/17/12 0121 01/16/12 1755  TROPONINI <0.30 <0.30   D.w RN - on IV heparin. BP was low but RN says this was due to nitro but now bp okay.  RN says HR right now is 74.  NO OVERT BLEEDING. Current bp is 06/18/50 with map 54. Patient asymptomatic  Filed Vitals:   01/16/12 2200 01/16/12 2235 01/16/12 2347 01/17/12 0022  BP: 101/47  90/41 92/40  Pulse: 88 88    Temp:  98.2 F (36.8 C)    TempSrc:  Oral    Resp: 15 18    Height:  5\' 6"  (1.676 m)    Weight:  132.8 kg (292 lb 12.3 oz)    SpO2: 100% 100%        eICU Interventions  This is c/w anemia of critical illness  Plan No transufsion indicated at this point unless bp/hr unstable due to overt bleeding   If MI/USA suspected or confirmed, tx for Hgb < 8.1gm%  If MI/USA ruled out, tx for hgb <7gm%   Intervention Category Intermediate Interventions: Diagnostic test evaluation  Kyanna Mahrt 01/17/2012, 3:29 AM

## 2012-01-17 NOTE — Progress Notes (Signed)
Attempted to call cath lab to get time for pt to transport. No answer will continue to call.

## 2012-01-17 NOTE — H&P (Signed)
     [facility]          INTERVAL PROCEDURE H&P   History and Physical Interval Note:  01/17/2012 10:59 AM  Christine [name] has presented today for their planned procedure. The various methods of treatment have been discussed with the patient and family. After consideration of risks, benefits and other options for treatment, the patient has consented to the procedure.  The patients' outpatient history has been reviewed, patient examined, and no change in status from most recent office note within the past 30 days. I have reviewed the patients' chart and labs and will proceed as planned. Questions were answered to the patient's satisfaction.   Christine Nose, MD, [facility] Attending Cardiologist [name]  Christine [name] 01/17/2012, 10:59 AM

## 2012-01-18 ENCOUNTER — Other Ambulatory Visit: Payer: Self-pay

## 2012-01-18 ENCOUNTER — Inpatient Hospital Stay (HOSPITAL_COMMUNITY): Payer: Medicare Other

## 2012-01-18 ENCOUNTER — Encounter (HOSPITAL_COMMUNITY): Payer: Self-pay | Admitting: Cardiology

## 2012-01-18 DIAGNOSIS — I480 Paroxysmal atrial fibrillation: Secondary | ICD-10-CM

## 2012-01-18 HISTORY — DX: Paroxysmal atrial fibrillation: I48.0

## 2012-01-18 LAB — GLUCOSE, CAPILLARY
Glucose-Capillary: 86 mg/dL (ref 70–99)
Glucose-Capillary: 93 mg/dL (ref 70–99)
Glucose-Capillary: 132 mg/dL — ABNORMAL HIGH (ref 70–99)

## 2012-01-18 LAB — POCT ACTIVATED CLOTTING TIME: Activated Clotting Time: 149 seconds

## 2012-01-18 LAB — CBC
HCT: 29.4 % — ABNORMAL LOW (ref 36.0–46.0)
Hemoglobin: 9.3 g/dL — ABNORMAL LOW (ref 12.0–15.0)
MCH: 29.5 pg (ref 26.0–34.0)
MCHC: 31.6 g/dL (ref 30.0–36.0)
MCV: 93.3 fL (ref 78.0–100.0)
RBC: 3.15 MIL/uL — ABNORMAL LOW (ref 3.87–5.11)

## 2012-01-18 LAB — BASIC METABOLIC PANEL
BUN: 33 mg/dL — ABNORMAL HIGH (ref 6–23)
CO2: 27 mEq/L (ref 19–32)
Calcium: 9.2 mg/dL (ref 8.4–10.5)
GFR calc non Af Amer: 30 mL/min — ABNORMAL LOW (ref >90)
Glucose, Bld: 85 mg/dL (ref 70–99)
Sodium: 132 mEq/L — ABNORMAL LOW (ref 135–145)

## 2012-01-18 LAB — HEPARIN LEVEL (UNFRACTIONATED): Heparin Unfractionated: 0.3 IU/mL (ref 0.30–0.70)

## 2012-01-18 MED ORDER — SODIUM CHLORIDE 0.9 % IV BOLUS (SEPSIS)
250.0000 mL | Freq: Once | INTRAVENOUS | Status: AC
  Administered 2012-01-18: 250 mL via INTRAVENOUS

## 2012-01-18 MED ORDER — DILTIAZEM HCL 100 MG IV SOLR
10.0000 mg | INTRAVENOUS | Status: AC
  Administered 2012-01-18: 10 mg via INTRAVENOUS
  Filled 2012-01-18: qty 100

## 2012-01-18 MED ORDER — DILTIAZEM HCL 100 MG IV SOLR
5.0000 mg/h | INTRAVENOUS | Status: DC
  Administered 2012-01-18 (×2): 5 mg/h via INTRAVENOUS
  Administered 2012-01-18: 15 mg/h via INTRAVENOUS
  Filled 2012-01-18 (×3): qty 100

## 2012-01-18 MED ORDER — HEPARIN BOLUS VIA INFUSION
4000.0000 [IU] | Freq: Once | INTRAVENOUS | Status: AC
  Administered 2012-01-18: 4000 [IU] via INTRAVENOUS
  Filled 2012-01-18: qty 4000

## 2012-01-18 MED ORDER — HEPARIN (PORCINE) IN NACL 100-0.45 UNIT/ML-% IJ SOLN
1450.0000 [IU]/h | INTRAMUSCULAR | Status: DC
  Administered 2012-01-18 – 2012-01-19 (×2): 1450 [IU]/h via INTRAVENOUS
  Filled 2012-01-18 (×3): qty 250

## 2012-01-18 MED FILL — Nitroglycerin IV Soln 200 MCG/ML in D5W: INTRAVENOUS | Qty: 1 | Status: AC

## 2012-01-18 MED FILL — Fentanyl Citrate Inj 0.05 MG/ML: INTRAMUSCULAR | Qty: 2 | Status: AC

## 2012-01-18 MED FILL — Midazolam HCl Inj 2 MG/2ML (Base Equivalent): INTRAMUSCULAR | Qty: 2 | Status: AC

## 2012-01-18 MED FILL — Heparin Sodium (Porcine) 2 Unit/ML in Sodium Chloride 0.9%: INTRAMUSCULAR | Qty: 2000 | Status: AC

## 2012-01-18 MED FILL — Lidocaine HCl Local Preservative Free (PF) Inj 1%: INTRAMUSCULAR | Qty: 30 | Status: AC

## 2012-01-18 NOTE — Progress Notes (Signed)
Pt. Seen and examined. Agree with the NP/PA-C note as written.  Mild, non-obstructive disease on cardiac cath yesterday. Still with some pleuritic chest pain, but has gone into new onset atrial fibrillation with RVR this morning. Prior vaginal bleeding on coumadin and s/p IVC filter for prior PE.  Will start heparin .. ? If she can tolerate xarelto or apixiban.  Cardizem for rate control. Will be with [place] probably over the weekend. Will need to re-assess 2D echo for LA size and consider cardioversion (probably TEE/cardioversion) at some.  Chrystie Nose, MD, [facility] Attending Cardiologist [facility]

## 2012-01-18 NOTE — Progress Notes (Signed)
Christine [name] In gold NP returned call , pt to cont on Cardizem gtt at 4mcq/hr and cont to monitor pt closely. Christine [name] N

## 2012-01-18 NOTE — Progress Notes (Signed)
2245 Pt BP  Dropped to 87/40, Cardizem gtt titrated  down from 46mcq/hr  to 60mcq/hr, recheck BP 2300 82/37, Cardizem gtt titrated  down to 7.81mcq/hr. 2315 BP 82/37 titrated Cardizem gtt to 61mcq/hr, 2330  BP 78/33 cardizem gtt on hold . Recheck BP 2344 BP 124/54, pt is asymptomatic, HR cont to be irregular and pt in Afib and HR 60- 80bpm. MD paged to call back

## 2012-01-18 NOTE — Progress Notes (Signed)
[facility]  Subjective: Still complaining of left sided CP which exacerbates with movement and inspiration.  Objective: Vital signs in last 24 hours: Temp:  [97.6 F (36.4 C)-99.4 F (37.4 C)] 97.8 F (36.6 C) (04/05 0813) Pulse Rate:  [87-90] 90  (04/05 0813) Resp:  [12-17] 12  (04/05 0813) BP: (78-117)/(33-56) 111/51 mmHg (04/05 0813) SpO2:  [98 %-100 %] 100 % (04/05 0813) FiO2 (%):  [2 %] 2 % (04/04 2000) Weight:  [132.45 kg (292 lb)-136.4 kg (300 lb 11.3 oz)] 136.4 kg (300 lb 11.3 oz) (04/05 0813)    Intake/Output from previous day: 04/04 0701 - 04/05 0700 In: 438.3 [P.O.:120; I.V.:318.3] Out: 300 [Urine:300] Intake/Output this shift: Total I/O In: 120 [P.O.:120] Out: 300 [Urine:300]  Medications Current Facility-Administered Medications  Medication Dose Route Frequency Provider Last Rate Last Dose  . 0.9 %  sodium chloride infusion  250 mL Intravenous PRN Nada Boozer, NP      . acetaminophen (TYLENOL) tablet 650 mg  650 mg Oral Q4H PRN Nada Boozer, NP   650 mg at 01/17/12 0000  . ALPRAZolam Prudy Feeler) tablet 0.25 mg  0.25 mg Oral BID PRN Nada Boozer, NP      . antiseptic oral rinse (BIOTENE) solution 15 mL  15 mL Mouth Rinse BID Thurmon Fair, MD   15 mL at 01/17/12 2012  . aspirin EC tablet 81 mg  81 mg Oral Daily Nada Boozer, NP      . atorvastatin (LIPITOR) tablet 10 mg  10 mg Oral q1800 Nada Boozer, NP   10 mg at 01/17/12 2254  . cholecalciferol (VITAMIN D) tablet 1,000 Units  1,000 Units Oral Daily Nada Boozer, NP      . diltiazem (CARDIZEM) 100 mg in dextrose 5 % 100 mL infusion  5 mg/hr Intravenous Titrated Nada Boozer, NP      . docusate sodium (COLACE) capsule 200 mg  200 mg Oral BID Nada Boozer, NP   200 mg at 01/17/12 2249  . ezetimibe (ZETIA) tablet 10 mg  10 mg Oral Daily Nada Boozer, NP      . HYDROcodone-acetaminophen [facility]) 10-325 MG per tablet 1 tablet  1 tablet Oral Q6H PRN Nada Boozer, NP   1 tablet at 01/17/12  2257  . metoCLOPramide (REGLAN) tablet 5 mg  5 mg Oral Q6H PRN Nada Boozer, NP      . metoprolol succinate (TOPROL-XL) 24 hr tablet 25 mg  25 mg Oral Daily Nada Boozer, NP   25 mg at 01/17/12 0916  . nitroGLYCERIN (NITROSTAT) SL tablet 0.4 mg  0.4 mg Sublingual Q5 min PRN Nada Boozer, NP   0.4 mg at 01/16/12 1817  . nitroGLYCERIN 0.2 mg/mL in dextrose 5 % infusion  5 mcg/min Intravenous Titrated Nada Boozer, NP   5 mcg/min at 01/16/12 2315  . ondansetron (ZOFRAN) injection 4 mg  4 mg Intravenous Q6H PRN Nada Boozer, NP      . pantoprazole (PROTONIX) EC tablet 40 mg  40 mg Oral Q1200 Nada Boozer, NP   40 mg at 01/17/12 0000  . polyethylene glycol (MIRALAX / GLYCOLAX) packet 17 g  17 g Oral Daily Nada Boozer, NP      . senna [facility]) tablet 8.6 mg  1 tablet Oral Daily Nada Boozer, NP      . sodium chloride 0.9 % bolus 250 mL  250 mL Intravenous Once Nada Boozer, NP      . sodium chloride 0.9 % injection 3 mL  3  mL Intravenous Q12H Nada Boozer, NP   3 mL at 01/17/12 2255  . sodium chloride 0.9 % injection 3 mL  3 mL Intravenous PRN Nada Boozer, NP      . zolpidem (AMBIEN) tablet 5 mg  5 mg Oral QHS PRN Nada Boozer, NP      . DISCONTD: 0.9 %  sodium chloride infusion   Intravenous Continuous Nada Boozer, NP 50 mL/hr at 01/16/12 2011    . DISCONTD: 0.9 %  sodium chloride infusion  250 mL Intravenous PRN Nada Boozer, NP      . DISCONTD: 0.9 %  sodium chloride infusion   Intravenous Continuous Nada Boozer, NP 75 mL/hr at 01/17/12 0918 250 mL at 01/17/12 0918  . DISCONTD: 0.9 %  sodium chloride infusion   Intravenous Continuous Chrystie Nose, MD      . DISCONTD: acetaminophen (TYLENOL) tablet 650 mg  650 mg Oral Q4H PRN Chrystie Nose, MD      . DISCONTD: amLODipine (NORVASC) tablet 10 mg  10 mg Oral Daily Nada Boozer, NP   10 mg at 01/17/12 0916  . DISCONTD: aspirin chewable tablet 324 mg  324 mg Oral Pre-Cath Nada Boozer, NP      . DISCONTD: heparin ADULT infusion 100 units/mL  (25000 units/250 mL)  1,350 Units/hr Intravenous Continuous [address] Darlina Gu[address]., PHARMD   1,350 Units/hr at 01/17/12 0414  . DISCONTD: ondansetron (ZOFRAN) injection 4 mg  4 mg Intravenous Q6H PRN Chrystie Nose, MD      . DISCONTD: sodium chloride 0.9 % injection 3 mL  3 mL Intravenous Q12H Nada Boozer, NP   3 mL at 01/16/12 2332  . DISCONTD: sodium chloride 0.9 % injection 3 mL  3 mL Intravenous PRN Nada Boozer, NP        PE: General appearance: alert, cooperative and no distress Lungs: Bilateral rales at bases. Heart: irregularly irregular rhythm Extremities: Edematous Pulses: Radials 2+ and symmetric  Lab Results:   Basename 01/18/12 0500 01/17/12 0300 01/16/12 1200  WBC 9.4 13.9* 17.2*  HGB 9.3* 8.8* 10.5*  HCT 29.4* 27.7* 32.7*  PLT 493* 571* 607*   BMET  Basename 01/18/12 0500 01/17/12 0300 01/16/12 1200  NA 132* 131* 130*  K 4.1 4.0 4.5  CL 97 94* 91*  CO2 27 29 27   GLUCOSE 85 94 156*  BUN 33* 41* 43*  CREATININE 1.58* 1.95* 2.14*  CALCIUM 9.2 9.2 9.7   PT/INR  Basename 01/17/12 0300 01/17/12 0121 01/16/12 1930  LABPROT 14.7 15.4* 15.2  INR 1.13 1.19 1.18   Cholesterol  Basename 01/17/12 0300  CHOL 158    Studies/Results: Left heart cath Medications:  Premedication: 5 mg Valium  Sedation: 0.5 mg IV Versed, 25 IV mcg Fentanyl  Contrast: 40 cc Omnipaque  Hemodynamics:  Central Aortic Pressure / Mean Aortic Pressure: 114/61  LV Pressure / LV End diastolic Pressure: 8  Aortic valve Pullback gradient: 8-10 mmHg  Coronary Angiographic Data:  Left Main: Calcified, no hemodynamically significant stenosis  Left Anterior Descending (LAD): Mild luminal irregularities, it reaches the apex  1st diagonal (D1): Smaller vessel without significant stenosis  Circumflex (LCx): Larger vessel with mild luminal irregularities.  1st obtuse marginal: High take-off of a large vessel which take a "ramus" course, no significant stenosis.  2nd obtuse marginal:  Smaller distal vessel  Right Coronary Artery: Large, dominant vessel with mild tubular proximal stenosis up to 30%  right ventricle branch of right coronary artery: No significant stenosis  posterior  descending artery: No significant stenosis  posterior lateral branch: No significant stenosis Impression:  1. Mild proximal tubular RCA stenosis up to 30%, otherwise no significant obstructive CAD  2. The aortic valve did not appear calcified and was easily crossed with a JR4 catheter. There was a small 8 mmHg pullback gradient.  3. LVEDP = 8 mmHg, which is low for a patient with moderate aortic stenosis.  Plan:  1. Medical management and work-up of non-cardiac causes of chest pain.  2. Question severity of reported aortic stenosis.  The case and results was discussed with the patient (and family).  The case and results was not discussed with the patient's PCP.  The case and results was discussed with the patient's Cardiologist.  Time Spend Directly with Patient:  45 minutes  Chrystie Nose, MD, [facility]  Attending Cardiologist  [facility]    Assessment/Plan   Principal Problem:  *Chest pain Active Problems:  Uterine cancer  Pulmonary embolism, Oct 2012  CKD (chronic kidney disease) stage 4, GFR 15-29 ml/min  Morbid obesity  Lymphedema  Presence of IVC filter  Aortic stenosis, moderate AFIB with RVR.  Plan:  In Afib with RVR which is new for her.  Intolerant to coumadin in the past with Vaginal bleeding requiring transfusion, with supratherapeutic INR.  IVC filter placed coumadin was stopped. Will initiate heparin.  ?Xarelto. Cardizem IV was ordered but her BP is in the mid 90's.  She is getting a NS bolus x2.  Start cardizem 10mg  bolus and then 5mg /hr.  S/P left heart cath yesterday with no significant stenosis.  Improved SCr. Decreased WBCs to WNL.  Anemic and Hgb slightly increased.  Low probability of PE on VQ this admission.   LOS: 2 days     Samie Barclift W 01/18/2012 9:18 AM

## 2012-01-18 NOTE — Progress Notes (Signed)
ANTICOAGULATION CONSULT NOTE - Follow Up Consult  Pharmacy Consult for heparin  Indication: atrial fibrillation  No Known Allergies  Patient Measurements: Height: 5\' 6"  (167.6 cm) Weight: 300 lb 11.3 oz (136.4 kg) IBW/kg (Calculated) : 59.3  Heparin Dosing Weight: 92.8 kg  Vital Signs: Temp: 98.4 F (36.9 C) (04/05 1957) Temp src: Oral (04/05 1957) BP: 106/46 mmHg (04/05 1957) Pulse Rate: 78  (04/05 1957)  Labs:  Basename 01/18/12 1946 01/18/12 0500 01/17/12 0300 01/17/12 0121 01/16/12 1930 01/16/12 1755 01/16/12 1200  HGB -- 9.3* 8.8* -- -- -- --  HCT -- 29.4* 27.7* -- -- -- 32.7*  PLT -- 493* 571* -- -- -- 607*  APTT -- -- -- 80* 44* -- --  LABPROT -- -- 14.7 15.4* 15.2 -- --  INR -- -- 1.13 1.19 1.18 -- --  HEPARINUNFRC 0.30 <0.10* 0.10* -- -- -- --  CREATININE -- 1.58* 1.95* -- -- -- 2.14*  CKTOTAL -- -- -- 119 -- 148 --  CKMB -- -- -- 1.9 -- 1.9 --  TROPONINI -- -- -- <0.30 -- <0.30 --   Estimated Creatinine Clearance: 41.7 ml/min (by C-G formula based on Cr of 1.58).   Medications:  Scheduled:    . antiseptic oral rinse  15 mL Mouth Rinse BID  . aspirin EC  81 mg Oral Daily  . atorvastatin  10 mg Oral q1800  . cholecalciferol  1,000 Units Oral Daily  . diltiazem  10 mg Intravenous STAT  . docusate sodium  200 mg Oral BID  . ezetimibe  10 mg Oral Daily  . heparin  4,000 Units Intravenous Once  . metoprolol succinate  25 mg Oral Daily  . pantoprazole  40 mg Oral Q1200  . polyethylene glycol  17 g Oral Daily  . senna  1 tablet Oral Daily  . sodium chloride  250 mL Intravenous Once  . sodium chloride  3 mL Intravenous Q12H  . DISCONTD: amLODipine  10 mg Oral Daily   Infusions:    . diltiazem (CARDIZEM) infusion 5 mg/hr (01/18/12 1021)  . heparin 1,450 Units/hr (01/18/12 1056)  . nitroGLYCERIN Stopped (01/16/12 2332)    Assessment: 78 yo F therapeutic on heparin for Afib.  No complications noted.  Goal of Therapy:  Heparin level 0.3-0.7 units/ml   Plan:  1.  Continue heparin at 1450 units/rh 2.  Check HL, CBC with am labs to confirm at goal, then continue daily monitoring.  Christine [name] L. Christine [name], PharmD, BCPS Clinical Pharmacist Pager: [phone] 01/18/2012 8:49 PM

## 2012-01-18 NOTE — Progress Notes (Signed)
ANTICOAGULATION CONSULT NOTE - Initial Consult  Pharmacy Consult for Heparin Indication: Atrial fibrillation  No Known Allergies  Patient Measurements: Height: 5\' 6"  (167.6 cm) Weight: 300 lb 11.3 oz (136.4 kg) IBW/kg (Calculated) : 59.3  Heparin Dosing Weight: 92.8 kg  Vital Signs: Temp: 97.8 F (36.6 C) (04/05 0813) Temp src: Oral (04/05 0813) BP: 111/51 mmHg (04/05 0813) Pulse Rate: 90  (04/05 0813)  Labs:  Basename 01/18/12 0500 01/17/12 0300 01/17/12 0121 01/16/12 1930 01/16/12 1755 01/16/12 1200  HGB 9.3* 8.8* -- -- -- --  HCT 29.4* 27.7* -- -- -- 32.7*  PLT 493* 571* -- -- -- 607*  APTT -- -- 80* 44* -- --  LABPROT -- 14.7 15.4* 15.2 -- --  INR -- 1.13 1.19 1.18 -- --  HEPARINUNFRC <0.10* 0.10* -- -- -- --  CREATININE 1.58* 1.95* -- -- -- 2.14*  CKTOTAL -- -- 119 -- 148 --  CKMB -- -- 1.9 -- 1.9 --  TROPONINI -- -- <0.30 -- <0.30 --   Estimated Creatinine Clearance: 41.7 ml/min (by C-G formula based on Cr of 1.58).  Medical History: Past Medical History  Diagnosis Date  . GERD (gastroesophageal reflux disease)   . Hypertension   . Diabetes mellitus     diet controlled  . Morbid obesity   . Arthritis   . Chronic kidney disease     nephrolithiasis left kidney,s/p open resection  . Status post arthroscopic surgery of right knee   . Diverticulosis of sigmoid colon   . Chronic back pain   . Pneumonia 07/2011    hx  . Pulmonary embolism 07/2011    hx  . Uterine cancer     endometrial high grade adenocarcinoma  . Uterus cancer, sarcoma     high grade invasive carcinosarcoma  . Myocardial infarction 1989  . Bronchitis   . H/O cardiomegaly   . Lymphedema 09/29/2011  . Diabetes mellitus, diet controlled 10/03/2011  . Chronic kidney disease, stage II (mild) 09/20/2011  . Acute diastolic congestive heart failure, 2D Oct 2012 09/29/2011  . Presence of IVC filter 01/08/2012  . History of colon polyps 01/08/2012  . Chest pain 01/16/2012  . Aortic stenosis,  moderate 01/16/2012    Medications:  Prescriptions prior to admission  Medication Sig Dispense Refill  . amLODipine (NORVASC) 10 MG tablet Take 10 mg by mouth daily.      . cholecalciferol (VITAMIN D) 1000 UNITS tablet Take 1,000 Units by mouth daily.      [name] docusate sodium (COLACE) 100 MG capsule Take 200 mg by mouth 2 (two) times daily. For diarrhea      . ezetimibe (ZETIA) 10 MG tablet Take 10 mg by mouth daily.       . furosemide (LASIX) 40 MG tablet Take 1 tablet (40 mg total) by mouth daily.  30 tablet    . HYDROcodone-acetaminophen (NORCO) 10-325 MG per tablet Take 1 tablet by mouth every 6 (six) hours as needed. For pain.       . metoprolol succinate (TOPROL-XL) 25 MG 24 hr tablet Take 25 mg by mouth daily.        . nitroGLYCERIN (NITROSTAT) 0.4 MG SL tablet Place 0.4 mg under the tongue every 5 (five) minutes as needed. For chest pain. Do not exceed 3 tablets. If pain is not resolved after 3rd tablet call 911.      . omeprazole (PRILOSEC) 20 MG capsule Take 20 mg by mouth daily.      . polyethylene glycol (MIRALAX /  GLYCOLAX) packet Take 17 g by mouth daily.      [name] senna (SENOKOT) 8.6 MG TABS Take 1 tablet by mouth.      . zolpidem (AMBIEN) 5 MG tablet Take 5 mg by mouth at bedtime as needed. For sleep.      . metoCLOPramide (REGLAN) 5 MG tablet Take 1 tablet (5 mg total) by mouth 4 (four) times daily as needed.       Scheduled:    . antiseptic oral rinse  15 mL Mouth Rinse BID  . aspirin EC  81 mg Oral Daily  . atorvastatin  10 mg Oral q1800  . cholecalciferol  1,000 Units Oral Daily  . diltiazem  10 mg Intravenous STAT  . docusate sodium  200 mg Oral BID  . ezetimibe  10 mg Oral Daily  . metoprolol succinate  25 mg Oral Daily  . pantoprazole  40 mg Oral Q1200  . polyethylene glycol  17 g Oral Daily  . senna  1 tablet Oral Daily  . sodium chloride  250 mL Intravenous Once  . sodium chloride  3 mL Intravenous Q12H  . DISCONTD: amLODipine  10 mg Oral Daily  . DISCONTD:  aspirin  324 mg Oral Pre-Cath  . DISCONTD: sodium chloride  3 mL Intravenous Q12H    Assessment: 78 y.o. F post cardiac cath on 4/4 a.m and now to start heparin for Afib with RVR. Likelihood of PE low on VQ scan. The patient was on heparin prior to the cath however this has since been d/ced. Since the patient is ~24 hours post cardiac cath -- will go ahead and give a full bolus. Heparin dosing wt~92.8 kg, SCr~30-40 ml/min. Hgb/Hct low but trending up today.   Goal of Therapy:  Heparin level 0.3-0.7 units/ml   Plan:  1. Heparin bolus of 4000 units x 1 2. Initiate heparin drip at rate of 1450 units/hr (14.5 ml/hr) 3. Daily heparin levels 4. Will continue to monitor for any signs/symptoms of bleeding and will follow up with heparin level in 8 hours   Georgina Pillion, PharmD, BCPS Clinical Pharmacist Pager: [phone] 01/18/2012 10:25 AM

## 2012-01-18 NOTE — Progress Notes (Signed)
   CARE MANAGEMENT NOTE 01/18/2012  Patient:  Christine [name], Christine [name]   Account Number:  [id number]  Date Initiated:  01/18/2012  Documentation initiated by:  GRAVES-BIGELOW,[place] Silveria  Subjective/Objective Assessment:   Pt presented for LHC. Pt this am still c/o of cp. Gone into new onset atrial fibrillation with RVR this morning. Pt is from home with son Leonette Most [phone] and he works during the day.     Action/Plan:   Pt states she has dme hospital bed, RW and cane at home.   Anticipated DC Date:  01/21/2012   Anticipated DC Plan:  HOME W HOME HEALTH SERVICES      DC Planning Services  CM consult      Choice offered to / List presented to:             Status of service:  In process, will continue to follow Medicare Important Message given?   (If response is "NO", the following Medicare IM given date fields will be blank) Date Medicare IM given:   Date Additional Medicare IM given:    Discharge Disposition:    Per UR Regulation:    If discussed at Long Length of Stay Meetings, dates discussed:    Comments:  01-18-12 1453 Tomi Bamberger, RN,BSN [phone] CM spoke to son early this am and he stated that he will be going to Social Services on Monday to see if pt is eligible for medicaid services. CM did call Rep for [facility] and she stated to go ahead and ask for PT consult to see if needs SNF again due to she was d/c from [facility] last Wed. CM did place copies of private duty care agencies in pt's room. Pt will have to pay out of pocket for services. CM also placed forms in room for resources that [facility] Dept provides. PT will be consulted and will f/u for recommendations.

## 2012-01-19 LAB — BASIC METABOLIC PANEL
BUN: 29 mg/dL — ABNORMAL HIGH (ref 6–23)
Calcium: 8.8 mg/dL (ref 8.4–10.5)
GFR calc Af Amer: 36 mL/min — ABNORMAL LOW (ref >90)
GFR calc non Af Amer: 31 mL/min — ABNORMAL LOW (ref >90)
Glucose, Bld: 96 mg/dL (ref 70–99)
Potassium: 4.2 mEq/L (ref 3.5–5.1)
Sodium: 134 mEq/L — ABNORMAL LOW (ref 135–145)

## 2012-01-19 LAB — CBC
MCH: 28.7 pg (ref 26.0–34.0)
MCHC: 31.3 g/dL (ref 30.0–36.0)
Platelets: 484 10*3/uL — ABNORMAL HIGH (ref 150–400)
RBC: 3.34 MIL/uL — ABNORMAL LOW (ref 3.87–5.11)

## 2012-01-19 LAB — HEPARIN LEVEL (UNFRACTIONATED): Heparin Unfractionated: 0.38 IU/mL (ref 0.30–0.70)

## 2012-01-19 LAB — GLUCOSE, CAPILLARY: Glucose-Capillary: 95 mg/dL (ref 70–99)

## 2012-01-19 MED ORDER — DRONEDARONE HCL 400 MG PO TABS
400.0000 mg | ORAL_TABLET | Freq: Two times a day (BID) | ORAL | Status: DC
  Administered 2012-01-19 – 2012-01-21 (×6): 400 mg via ORAL
  Filled 2012-01-19 (×7): qty 1

## 2012-01-19 MED ORDER — OFF THE BEAT BOOK
Freq: Once | Status: AC
  Administered 2012-01-19: 22:00:00
  Filled 2012-01-19: qty 1

## 2012-01-19 MED ORDER — FUROSEMIDE 40 MG PO TABS
40.0000 mg | ORAL_TABLET | Freq: Every day | ORAL | Status: DC
  Administered 2012-01-19 – 2012-01-20 (×2): 40 mg via ORAL
  Filled 2012-01-19 (×3): qty 1

## 2012-01-19 NOTE — Progress Notes (Signed)
[facility]  DAILY PROGRESS NOTE   Subjective:  Occasional mild pleuritic discomfort, even after she converted to NSR 88 bpm.  Objective:  Temp:  [97.7 F (36.5 C)-98.4 F (36.9 C)] 97.8 F (36.6 C) (04/06 0730) Pulse Rate:  [51-114] 76  (04/06 0730) Resp:  [11-21] 13  (04/06 0730) BP: (78-124)/(32-103) 110/47 mmHg (04/06 0730) SpO2:  [98 %-100 %] 100 % (04/06 0730) Weight:  [137.1 kg (302 lb 4 oz)] 137.1 kg (302 lb 4 oz) (04/06 0552) Weight change: -156.3 kg (-344 lb 9.3 oz)  Intake/Output from previous day: 04/05 0701 - 04/06 0700 In: 1589.9 [P.O.:1080; I.V.:509.9] Out: 700 [Urine:700]  Intake/Output from this shift:    Medications: Current Facility-Administered Medications  Medication Dose Route Frequency Provider Last Rate Last Dose  . 0.9 %  sodium chloride infusion  250 mL Intravenous PRN Nada Boozer, NP      . acetaminophen (TYLENOL) tablet 650 mg  650 mg Oral Q4H PRN Nada Boozer, NP   650 mg at 01/17/12 0000  . ALPRAZolam Prudy Feeler) tablet 0.25 mg  0.25 mg Oral BID PRN Nada Boozer, NP      . antiseptic oral rinse (BIOTENE) solution 15 mL  15 mL Mouth Rinse BID Thurmon Fair, MD   15 mL at 01/17/12 2012  . aspirin EC tablet 81 mg  81 mg Oral Daily Nada Boozer, NP   81 mg at 01/18/12 1025  . atorvastatin (LIPITOR) tablet 10 mg  10 mg Oral q1800 Nada Boozer, NP   10 mg at 01/18/12 1706  . cholecalciferol (VITAMIN D) tablet 1,000 Units  1,000 Units Oral Daily Nada Boozer, NP   1,000 Units at 01/18/12 1029  . diltiazem (CARDIZEM) injection 10 mg  10 mg Intravenous STAT Chrystie Nose, MD   10 mg at 01/18/12 1025  . docusate sodium (COLACE) capsule 200 mg  200 mg Oral BID Nada Boozer, NP   200 mg at 01/18/12 2113  . dronedarone (MULTAQ) tablet 400 mg  400 mg Oral BID WC Nada Boozer, NP      . ezetimibe (ZETIA) tablet 10 mg  10 mg Oral Daily Nada Boozer, NP   10 mg at 01/18/12 1029  . furosemide (LASIX) tablet 40 mg  40 mg Oral Daily  Nada Boozer, NP      . heparin bolus via infusion 4,000 Units  4,000 Units Intravenous Once Ann Held, PHARMD   4,000 Units at 01/18/12 1057  . HYDROcodone-acetaminophen (NORCO) 10-325 MG per tablet 1 tablet  1 tablet Oral Q6H PRN Nada Boozer, NP   1 tablet at 01/18/12 1707  . metoCLOPramide (REGLAN) tablet 5 mg  5 mg Oral Q6H PRN Nada Boozer, NP      . metoprolol succinate (TOPROL-XL) 24 hr tablet 25 mg  25 mg Oral Daily Nada Boozer, NP   25 mg at 01/18/12 1030  . nitroGLYCERIN (NITROSTAT) SL tablet 0.4 mg  0.4 mg Sublingual Q5 min PRN Nada Boozer, NP   0.4 mg at 01/16/12 1817  . ondansetron (ZOFRAN) injection 4 mg  4 mg Intravenous Q6H PRN Nada Boozer, NP      . pantoprazole (PROTONIX) EC tablet 40 mg  40 mg Oral Q1200 Nada Boozer, NP   40 mg at 01/18/12 1031  . polyethylene glycol (MIRALAX / GLYCOLAX) packet 17 g  17 g Oral Daily Nada Boozer, NP   17 g at 01/18/12 1025  . senna (SENOKOT) tablet 8.6 mg  1 tablet Oral Daily  Nada Boozer, NP   8.6 mg at 01/18/12 1034  . sodium chloride 0.9 % injection 3 mL  3 mL Intravenous Q12H Nada Boozer, NP   3 mL at 01/18/12 1032  . sodium chloride 0.9 % injection 3 mL  3 mL Intravenous PRN Nada Boozer, NP      . zolpidem [facility]) tablet 5 mg  5 mg Oral QHS PRN Nada Boozer, NP      . DISCONTD: diltiazem (CARDIZEM) 100 mg in dextrose 5 % 100 mL infusion  5 mg/hr Intravenous Titrated Nada Boozer, NP   5 mg/hr at 01/19/12 0200  . DISCONTD: heparin ADULT infusion 100 units/mL (25000 units/250 mL)  1,450 Units/hr Intravenous Continuous Ann Held, PHARMD 14.5 mL/hr at 01/19/12 0600 1,450 Units/hr at 01/19/12 0600  . DISCONTD: nitroGLYCERIN 0.2 mg/mL in dextrose 5 % infusion  5 mcg/min Intravenous Titrated Nada Boozer, NP   5 mcg/min at 01/16/12 2315    Physical Exam: PE: General:Alert oriented African American female with current chest pain  Skin:Warm and dry, brisk capillary refill  HEENT:Normocephalic, sclera clear  Neck:No JVD  no carotid bruits  Heart:S1-S2 regular rate and rhythm with 2-3/6 systolic murmur  Lungs:Clear with rales rhonchi or wheezes . No pleural rubs WUJ:WJXBJ, soft, nontender, positive bowel sounds do not palpate liver spleen or masses  YNW:GNFAOZHY lymphedema with 1+ edema ; no access site complications Neuro:Alert and oriented x3, pulse commands moves all extremities.    Lab Results: Results for orders placed during the hospital encounter of 01/16/12 (from the past 48 hour(s))  POCT ACTIVATED CLOTTING TIME     Status: Normal   Collection Time   01/17/12 10:49 AM      Component Value Range Comment   Activated Clotting Time 149     GLUCOSE, CAPILLARY     Status: Abnormal   Collection Time   01/17/12 11:09 AM      Component Value Range Comment   Glucose-Capillary 102 (*) 70 - 99 (mg/dL)   GLUCOSE, CAPILLARY     Status: Abnormal   Collection Time   01/17/12  5:50 PM      Component Value Range Comment   Glucose-Capillary 104 (*) 70 - 99 (mg/dL)   GLUCOSE, CAPILLARY     Status: Abnormal   Collection Time   01/17/12  9:35 PM      Component Value Range Comment   Glucose-Capillary 104 (*) 70 - 99 (mg/dL)    Comment 1 Notify RN      Comment 2 Documented in Chart     HEPARIN LEVEL (UNFRACTIONATED)     Status: Abnormal   Collection Time   01/18/12  5:00 AM      Component Value Range Comment   Heparin Unfractionated <0.10 (*) 0.30 - 0.70 (IU/mL)   CBC     Status: Abnormal   Collection Time   01/18/12  5:00 AM      Component Value Range Comment   WBC 9.4  4.0 - 10.5 (K/uL)    RBC 3.15 (*) 3.87 - 5.11 (MIL/uL)    Hemoglobin 9.3 (*) 12.0 - 15.0 (g/dL)    HCT 86.5 (*) 78.4 - 46.0 (%)    MCV 93.3  78.0 - 100.0 (fL)    MCH 29.5  26.0 - 34.0 (pg)    MCHC 31.6  30.0 - 36.0 (g/dL)    RDW 69.6 (*) 29.5 - 15.5 (%)    Platelets 493 (*) 150 - 400 (K/uL)   BASIC METABOLIC PANEL  Status: Abnormal   Collection Time   01/18/12  5:00 AM      Component Value Range Comment   Sodium 132 (*) 135 - 145 (mEq/L)      Potassium 4.1  3.5 - 5.1 (mEq/L)    Chloride 97  96 - 112 (mEq/L)    CO2 27  19 - 32 (mEq/L)    Glucose, Bld 85  70 - 99 (mg/dL)    BUN 33 (*) 6 - 23 (mg/dL)    Creatinine, Ser 1.19 (*) 0.50 - 1.10 (mg/dL)    Calcium 9.2  8.4 - 10.5 (mg/dL)    GFR calc non Af Amer 30 (*) >90 (mL/min)    GFR calc Af Amer 35 (*) >90 (mL/min)   GLUCOSE, CAPILLARY     Status: Normal   Collection Time   01/18/12  8:06 AM      Component Value Range Comment   Glucose-Capillary 86  70 - 99 (mg/dL)   GLUCOSE, CAPILLARY     Status: Abnormal   Collection Time   01/18/12 12:14 PM      Component Value Range Comment   Glucose-Capillary 172 (*) 70 - 99 (mg/dL)   GLUCOSE, CAPILLARY     Status: Normal   Collection Time   01/18/12  5:14 PM      Component Value Range Comment   Glucose-Capillary 93  70 - 99 (mg/dL)   HEPARIN LEVEL (UNFRACTIONATED)     Status: Normal   Collection Time   01/18/12  7:46 PM      Component Value Range Comment   Heparin Unfractionated 0.30  0.30 - 0.70 (IU/mL)   GLUCOSE, CAPILLARY     Status: Abnormal   Collection Time   01/18/12  9:39 PM      Component Value Range Comment   Glucose-Capillary 132 (*) 70 - 99 (mg/dL)    Comment 1 Notify RN      Comment 2 Documented in Chart     BASIC METABOLIC PANEL     Status: Abnormal   Collection Time   01/19/12  5:00 AM      Component Value Range Comment   Sodium 134 (*) 135 - 145 (mEq/L)    Potassium 4.2  3.5 - 5.1 (mEq/L)    Chloride 99  96 - 112 (mEq/L)    CO2 26  19 - 32 (mEq/L)    Glucose, Bld 96  70 - 99 (mg/dL)    BUN 29 (*) 6 - 23 (mg/dL)    Creatinine, Ser 1.47 (*) 0.50 - 1.10 (mg/dL)    Calcium 8.8  8.4 - 10.5 (mg/dL)    GFR calc non Af Amer 31 (*) >90 (mL/min)    GFR calc Af Amer 36 (*) >90 (mL/min)   CBC     Status: Abnormal   Collection Time   01/19/12  5:00 AM      Component Value Range Comment   WBC 10.3  4.0 - 10.5 (K/uL)    RBC 3.34 (*) 3.87 - 5.11 (MIL/uL)    Hemoglobin 9.6 (*) 12.0 - 15.0 (g/dL)    HCT 82.9 (*) 56.2 - 46.0  (%)    MCV 91.9  78.0 - 100.0 (fL)    MCH 28.7  26.0 - 34.0 (pg)    MCHC 31.3  30.0 - 36.0 (g/dL)    RDW 13.0 (*) 86.5 - 15.5 (%)    Platelets 484 (*) 150 - 400 (K/uL)   HEPARIN LEVEL (UNFRACTIONATED)     Status: Normal  Collection Time   01/19/12  5:00 AM      Component Value Range Comment   Heparin Unfractionated 0.38  0.30 - 0.70 (IU/mL)   GLUCOSE, CAPILLARY     Status: Normal   Collection Time   01/19/12  7:31 AM      Component Value Range Comment   Glucose-Capillary 95  70 - 99 (mg/dL)    Comment 1 Notify RN      Comment 2 Documented in Chart       Imaging: Imaging results have been reviewed and Dg Chest Port 1 View  01/18/2012  *RADIOLOGY REPORT*  Clinical Data: Right chest pain.  No headache or dizziness.  PORTABLE CHEST - 1 VIEW  Comparison: 01/16/2012.  Findings: Cardiomegaly is present with bilateral perihilar and basilar predominant airspace disease most compatible with pulmonary edema and CHF. Monitoring leads are projected over the chest. Surgical clips are present in the left upper quadrant.  There may be a small left pleural effusion.  IMPRESSION: Mild to moderate CHF.  Overall, the radiograph appears slightly worse than the prior 01/16/2012.  Original Report Authenticated By: Andreas Newport, M.D.    Assessment:  1. Principal Problem: 2.  *Chest pain, negative MI, patent coronary arteries by cath 01/17/12 3. Active Problems: 4.  Uterine cancer 5.  Pulmonary embolism, Oct 2012 6.  CKD (chronic kidney disease) stage 4, GFR 15-29 ml/min 7.  Morbid obesity 8.  Lymphedema 9.  Presence of IVC filter 10.  Aortic stenosis, moderate 11.  PAF (paroxysmal atrial fibrillation), 01/18/12 12.   Plan:  1. Pleuritic chest discomfort is most likely due to recurrent small pulmonary infarction in a patient with IVC filter who is not on anticoagulation Rx. 2. Symptomatic AF may benefit from antiarrhythmic Rx with Multaq. CHF has not been a big problem for her in the past; EF is normal;  LVEDP was 8. 3. Diagnosis of "moderate" AS is in doubt based on cath measurements. 4. Possible DC in AM if tolerating Multaq well  Time Spent Directly with Patient:  30 minutes  Length of Stay:  LOS: 3 days    Itzabella Sorrels 01/19/2012, 9:38 AM

## 2012-01-19 NOTE — Progress Notes (Signed)
Pt remain in NSR, HR 70's bpm. BP still soft 110/44(57) Cardizem gtt still on pause for now. Christine [name] N

## 2012-01-19 NOTE — Progress Notes (Signed)
BP 84/41(50) Cardizem gtt pause. Will recheck BP.

## 2012-01-19 NOTE — Progress Notes (Signed)
Pt converted to NSR HR 60-70bpm

## 2012-01-20 DIAGNOSIS — Z8719 Personal history of other diseases of the digestive system: Secondary | ICD-10-CM

## 2012-01-20 LAB — BASIC METABOLIC PANEL
BUN: 27 mg/dL — ABNORMAL HIGH (ref 6–23)
CO2: 28 mEq/L (ref 19–32)
Calcium: 9.1 mg/dL (ref 8.4–10.5)
Creatinine, Ser: 1.48 mg/dL — ABNORMAL HIGH (ref 0.50–1.10)
GFR calc non Af Amer: 33 mL/min — ABNORMAL LOW (ref >90)
Glucose, Bld: 84 mg/dL (ref 70–99)

## 2012-01-20 LAB — GLUCOSE, CAPILLARY
Glucose-Capillary: 77 mg/dL (ref 70–99)
Glucose-Capillary: 71 mg/dL (ref 70–99)

## 2012-01-20 NOTE — Progress Notes (Signed)
Subjective: Complains of increased fatigue, after going to bathroom.  Some mild ant chest pain continues.    Objective: Vital signs in last 24 hours: Temp:  [97.9 F (36.6 C)-98.6 F (37 C)] 97.9 F (36.6 C) (04/07 0500) Pulse Rate:  [83-84] 83  (04/07 0500) Resp:  [16-20] 18  (04/07 0500) BP: (106-111)/(40-70) 111/65 mmHg (04/07 0500) SpO2:  [96 %-100 %] 96 % (04/07 0500) Weight change:  Last BM Date: 01/19/12 Intake/Output from previous day: +561 04/06 0701 - 04/07 0700 In: 321 [P.O.:315; I.V.:6] Out: -  Intake/Output this shift: Total I/O In: 360 [P.O.:360] Out: 300 [Urine:300]  PE:  General:Alert oriented African American female  Complaints as above Skin:Warm and dry, brisk capillary refill  HEENT:Normocephalic, sclera clear  Neck:No JVD no carotid bruits  Heart:S1-S2 regular rate and rhythm with 2-3/6 systolic murmur  Lungs:Clear with rales rhonchi or wheezes . No pleural rubs  WUJ:WJXBJ, soft, nontender, positive bowel sounds do not palpate liver spleen or masses  YNW:GNFAOZHY lymphedema with 1+ edema ; no access site complications  Neuro:Alert and oriented x3, pulse commands moves all extremities.    Lab Results:  Basename 01/19/12 0500 01/18/12 0500  WBC 10.3 9.4  HGB 9.6* 9.3*  HCT 30.7* 29.4*  PLT 484* 493*   BMET  Basename 01/20/12 0720 01/19/12 0500  NA 135 134*  K 4.3 4.2  CL 99 99  CO2 28 26  GLUCOSE 84 96  BUN 27* 29*  CREATININE 1.48* 1.53*  CALCIUM 9.1 8.8   No results found for this basename: TROPONINI:2,CK,MB:2 in the last 72 hours  Lab Results  Component Value Date   CHOL 158 01/17/2012   HDL 27* 01/17/2012   LDLCALC 105* 01/17/2012   TRIG 131 01/17/2012   CHOLHDL 5.9 01/17/2012   Lab Results  Component Value Date   HGBA1C 5.8* 01/17/2012     Lab Results  Component Value Date   TSH 2.388 01/17/2012    Hepatic Function Panel No results found for this basename: PROT,ALBUMIN,AST,ALT,ALKPHOS,BILITOT,BILIDIR,IBILI in the last 72 hours No  results found for this basename: CHOL in the last 72 hours No results found for this basename: PROTIME in the last 72 hours    EKG: Orders placed during the hospital encounter of 01/16/12  . EKG 12-LEAD  . EKG 12-LEAD  . EKG 12-LEAD  . EKG  . EKG 12-LEAD  . EKG 12-LEAD    Studies/Results: Dg Chest Port 1 View  01/18/2012  *RADIOLOGY REPORT*  Clinical Data: Right chest pain.  No headache or dizziness.  PORTABLE CHEST - 1 VIEW  Comparison: 01/16/2012.  Findings: Cardiomegaly is present with bilateral perihilar and basilar predominant airspace disease most compatible with pulmonary edema and CHF. Monitoring leads are projected over the chest. Surgical clips are present in the left upper quadrant.  There may be a small left pleural effusion.  IMPRESSION: Mild to moderate CHF.  Overall, the radiograph appears slightly worse than the prior 01/16/2012.  Original Report Authenticated By: Andreas Newport, M.D.    Medications: I have reviewed the patient's current medications.    [name] antiseptic oral rinse  15 mL Mouth Rinse BID  . aspirin EC  81 mg Oral Daily  . atorvastatin  10 mg Oral q1800  . cholecalciferol  1,000 Units Oral Daily  . docusate sodium  200 mg Oral BID  . dronedarone  400 mg Oral BID WC  . ezetimibe  10 mg Oral Daily  . furosemide  40 mg Oral Daily  . metoprolol  succinate  25 mg Oral Daily  . off the beat book   Does not apply Once  . pantoprazole  40 mg Oral Q1200  . polyethylene glycol  17 g Oral Daily  . senna  1 tablet Oral Daily  . sodium chloride  3 mL Intravenous Q12H   Assessment/Plan: Patient Active Problem List  Diagnoses  . Uterine cancer  . Chronic back pain  . Pneumonia  . Pulmonary embolism, Oct 2012  . CKD (chronic kidney disease) stage 4, GFR 15-29 ml/min  . Hypertension  . GERD (gastroesophageal reflux disease)  . Arthritis, on high dose steroids since 12/16 for toe pain  . Morbid obesity  . Leukocytosis  . Acute diastolic congestive heart  failure, 2D Oct 2012  . Lymphedema  . Diabetes mellitus, diet controlled  . Acute on chronic renal failure  . UTI (lower urinary tract infection)  . AKI (acute kidney injury)  . Atypical chest pain  . Preventative health care  . Presence of IVC filter  . History of colon polyps  . Weakness generalized  . Bilateral hand pain  . Chest pain, negative MI, patent coronary arteries by cath 01/17/12  . Aortic stenosis, moderate  . PAF (paroxysmal atrial fibrillation), 01/18/12   PLAN:  Maintaining SR. No atrial fib.  ? Discharge today.  On Multaq, back on home lasix.  LOS: 4 days   INGOLD,LAURA R 01/20/2012, 10:04 AM   I have seen and examined the patient along with [facility] R, NP.  I have reviewed the chart, notes and new data.  I agree with NP's note.  Key new complaints: Weak, improving pleuritic pain Key examination changes: no pleural rubs Key new findings / data: renal function a little better  PLAN: DC today on Multaq and metoprolol. Expect she will have future episodes of pleuritic pain/pulmonary infarction without warfarin anticoagulation.  Thurmon Fair, MD, [facility] [phone] 01/20/2012, 10:37 AM

## 2012-01-21 LAB — GLUCOSE, CAPILLARY
Glucose-Capillary: 82 mg/dL (ref 70–99)
Glucose-Capillary: 87 mg/dL (ref 70–99)

## 2012-01-21 MED ORDER — ASPIRIN 81 MG PO TBEC: 81.0000 mg | DELAYED_RELEASE_TABLET | Freq: Every day | ORAL | Status: DC

## 2012-01-21 MED ORDER — DRONEDARONE HCL 400 MG PO TABS: 400.0000 mg | ORAL_TABLET | Freq: Two times a day (BID) | ORAL | Status: DC

## 2012-01-21 MED ORDER — ATORVASTATIN CALCIUM 10 MG PO TABS: 10.0000 mg | ORAL_TABLET | Freq: Every day | ORAL | Status: DC

## 2012-01-21 NOTE — Progress Notes (Addendum)
Noted request for PT/OT consult, CM continuing to follow will setup HHPT and OT if needed and speak with pt son re preferences. Still question of return to SNF await PT/OT consult and recommendation.  Johny Shock RN MPH Case management [phone]

## 2012-01-21 NOTE — Progress Notes (Signed)
Pt and son provided with d/c instructions and prescriptions to new medications. Pt and son educated on new medications (how/when to take them). All questions were answered to patient satisfaction. Wilburt Finlay, PA was paged and talked to pt son, Leonette Most, on phone about [facility] services. No complaints at this time. Pt was dressed and taken to car via wheelchair. No complaints at this time. Ramond Craver, Rn

## 2012-01-21 NOTE — Progress Notes (Signed)
[facility]  Subjective: Tired after trip to the bathroom when HR increased to 140's.  Objective: Vital signs in last 24 hours: Temp:  [97.9 F (36.6 C)] 97.9 F (36.6 C) (04/08 0500) Pulse Rate:  [81-86] 86  (04/08 0500) Resp:  [18-20] 18  (04/08 0500) BP: (90-108)/(52-68) 90/58 mmHg (04/08 0915) SpO2:  [98 %-99 %] 98 % (04/08 0500) Last BM Date: 01/21/12  Intake/Output from previous day: 04/07 0701 - 04/08 0700 In: 840 [P.O.:840] Out: 850 [Urine:850] Intake/Output this shift: Total I/O In: 360 [P.O.:360] Out: 175 [Urine:175]  Medications Current Facility-Administered Medications  Medication Dose Route Frequency Provider Last Rate Last Dose  . 0.9 %  sodium chloride infusion  250 mL Intravenous PRN Nada Boozer, NP      . acetaminophen (TYLENOL) tablet 650 mg  650 mg Oral Q4H PRN Nada Boozer, NP   650 mg at 01/17/12 0000  . ALPRAZolam Prudy Feeler) tablet 0.25 mg  0.25 mg Oral BID PRN Nada Boozer, NP      . antiseptic oral rinse (BIOTENE) solution 15 mL  15 mL Mouth Rinse BID Mihai Croitoru, MD   15 mL at 01/21/12 0800  . aspirin EC tablet 81 mg  81 mg Oral Daily Nada Boozer, NP   81 mg at 01/21/12 0919  . atorvastatin (LIPITOR) tablet 10 mg  10 mg Oral q1800 Nada Boozer, NP   10 mg at 01/20/12 1704  . cholecalciferol (VITAMIN D) tablet 1,000 Units  1,000 Units Oral Daily Nada Boozer, NP   1,000 Units at 01/21/12 0919  . docusate sodium (COLACE) capsule 200 mg  200 mg Oral BID Nada Boozer, NP   200 mg at 01/20/12 0956  . dronedarone (MULTAQ) tablet 400 mg  400 mg Oral BID WC Nada Boozer, NP   400 mg at 01/21/12 0740  . ezetimibe (ZETIA) tablet 10 mg  10 mg Oral Daily Nada Boozer, NP   10 mg at 01/21/12 0919  . furosemide (LASIX) tablet 40 mg  40 mg Oral Daily Nada Boozer, NP   40 mg at 01/20/12 0956  . HYDROcodone-acetaminophen (NORCO) 10-325 MG per tablet 1 tablet  1 tablet Oral Q6H PRN Nada Boozer, NP   1 tablet at 01/21/12 0414  .  metoCLOPramide (REGLAN) tablet 5 mg  5 mg Oral Q6H PRN Nada Boozer, NP      . metoprolol succinate (TOPROL-XL) 24 hr tablet 25 mg  25 mg Oral Daily Nada Boozer, NP   25 mg at 01/20/12 0956  . nitroGLYCERIN (NITROSTAT) SL tablet 0.4 mg  0.4 mg Sublingual Q5 min PRN Nada Boozer, NP   0.4 mg at 01/16/12 1817  . ondansetron (ZOFRAN) injection 4 mg  4 mg Intravenous Q6H PRN Nada Boozer, NP      . pantoprazole (PROTONIX) EC tablet 40 mg  40 mg Oral Q1200 Nada Boozer, NP   40 mg at 01/20/12 1205  . polyethylene glycol (MIRALAX / GLYCOLAX) packet 17 g  17 g Oral Daily Nada Boozer, NP   17 g at 01/20/12 0956  . senna (SENOKOT) tablet 8.6 mg  1 tablet Oral Daily Nada Boozer, NP   8.6 mg at 01/20/12 0956  . sodium chloride 0.9 % injection 3 mL  3 mL Intravenous Q12H Nada Boozer, NP   3 mL at 01/21/12 0919  . sodium chloride 0.9 % injection 3 mL  3 mL Intravenous PRN Nada Boozer, NP      . zolpidem [facility]) tablet 5 mg  5 mg Oral QHS PRN Nada Boozer, NP   5 mg at 01/20/12 2146    PE: General appearance: alert, cooperative and no distress Lungs: clear to auscultation bilaterally Heart: regular rate and rhythm, 1/6 SS MM. Extremities: Chronic lymphadema Pulses: Radials  2+ and symmetric  Lab Results:   Basename 01/19/12 0500  WBC 10.3  HGB 9.6*  HCT 30.7*  PLT 484*   BMET  Basename 01/20/12 0720 01/19/12 0500  NA 135 134*  K 4.3 4.2  CL 99 99  CO2 28 26  GLUCOSE 84 96  BUN 27* 29*  CREATININE 1.48* 1.53*  CALCIUM 9.1 8.8    Assessment/Plan   Principal Problem:  *Chest pain, negative MI, patent coronary arteries by cath 01/17/12 Active Problems:  Uterine cancer  Pulmonary embolism, Oct 2012  CKD (chronic kidney disease) stage 4, GFR 15-29 ml/min  Morbid obesity  Lymphedema  Presence of IVC filter  Aortic stenosis, moderate  PAF (paroxysmal atrial fibrillation), 01/18/12  H/O: GI bleed, severe, not anticoagulant candidtate   PLAN:  She continues in NSR.  She had some  sinus tach(140's) around 0830 hrs this AM.  This was about the time she was in the bathroom.  Getting Multaq/lopressor/ASA/lasix/lipitor.  Hypotensive this AM 90/58.  Lopressor held. Case mgt requested PT consult last week.  Ordering today.  May DC today but ? SNF.  She was at [facility] prior to here.   LOS: 5 days    HAGER,BRYAN W 01/21/2012 9:41 AM  ATTENDING ATTESTATION:  I have seen and examined the patient along with Wilburt Finlay PA.  I have reviewed the chart, notes and new data.  I agree with Bryan's note.  Brief Description: 78 y/o woman with h/o PE s/p IVC filter & not on anticoagulation.  LHC/Angio on 01/17/12 with no significant CAD. Has H/o Afib - but remains in NSR here.  Has persistent  & recurrent Pleuritic CP - likely secondary to micro- pulmonary emboli. Was planned to discharge today - but still with fatigue / DOE & noted Sinus Tachy with walking to bathroom -- likely very deconditioned. Converted to NSR between 4/5 & 4/6.   Key new complaints: noted above.  Key examination changes: agree with Bryan's exam  Key new findings / data: no new labs today; TELE SR - Sinus Tachy  PLAN:  Continue with Multaq for Afib along with BB; ASA only - previously not warfarin candidate  No BP room to increase BB dose -- may need to titrate up as OP as BP normalizes given S tachy with ambulation, but I suspect this is due to PEs & deconditioning.   IVC filter in place, but cannot rule out micro-PEs as cause of pleuritic pain.  Continue current dose of Lasix & statin  Awaiting PT/OT eval for recommendations re d/c planning --> Home Health vs recommendation for SNF.  Marykay Lex, M.D., M.S. [facility] [address] [place], [place]  [address]  [phone]  01/21/2012 2:51 PM

## 2012-01-21 NOTE — Progress Notes (Signed)
Pt son was called to make aware of mothers d/c ready. Pt son is the only ride/contact that she has for post d/c. This nurse left a message asking him to call back. Will hold off on going over d/c instructions until a return call is received. Ramond Craver, RN

## 2012-01-21 NOTE — Evaluation (Signed)
Physical Therapy Evaluation Patient Details Name: Christine [name] MRN: [id number] DOB: 09/06/1931 Today's Date: 01/21/2012  Problem List:  Patient Active Problem List  Diagnoses  . Uterine cancer  . Chronic back pain  . Pneumonia  . Pulmonary embolism, Oct 2012  . CKD (chronic kidney disease) stage 4, GFR 15-29 ml/min  . Hypertension  . GERD (gastroesophageal reflux disease)  . Arthritis, on high dose steroids since 12/16 for toe pain  . Morbid obesity  . Leukocytosis  . Acute diastolic congestive heart failure, 2D Oct 2012  . Lymphedema  . Diabetes mellitus, diet controlled  . Acute on chronic renal failure  . UTI (lower urinary tract infection)  . AKI (acute kidney injury)  . Atypical chest pain  . Preventative health care  . Presence of IVC filter  . History of colon polyps  . Weakness generalized  . Bilateral hand pain  . Chest pain, negative MI, patent coronary arteries by cath 01/17/12  . Aortic stenosis, moderate  . PAF (paroxysmal atrial fibrillation), 01/18/12  . H/O: GI bleed, severe, not anticoagulant candidtate     Past Medical History:  Past Medical History  Diagnosis Date  . GERD (gastroesophageal reflux disease)   . Hypertension   . Diabetes mellitus     diet controlled  . Morbid obesity   . Arthritis   . Chronic kidney disease     nephrolithiasis left kidney,s/p open resection  . Status post arthroscopic surgery of right knee   . Diverticulosis of sigmoid colon   . Chronic back pain   . Pneumonia 07/2011    hx  . Pulmonary embolism 07/2011    hx  . Uterine cancer     endometrial high grade adenocarcinoma  . Uterus cancer, sarcoma     high grade invasive carcinosarcoma  . Myocardial infarction 1989  . Bronchitis   . H/O cardiomegaly   . Lymphedema 09/29/2011  . Diabetes mellitus, diet controlled 10/03/2011  . Chronic kidney disease, stage II (mild) 09/20/2011  . Acute diastolic congestive heart failure, 2D Oct 2012 09/29/2011  . Presence of IVC  filter 01/08/2012  . History of colon polyps 01/08/2012  . Chest pain 01/16/2012  . Aortic stenosis, moderate 01/16/2012  . PAF (paroxysmal atrial fibrillation), 01/18/12 01/18/2012   Past Surgical History:  Past Surgical History  Procedure Date  . Colonoscopy     negative  . Knee arthroscopy     right knee  . Cataract extraction, bilateral   . Tubal ligation 1973  . Abdominal hysterectomy 10/05/2008    total with b/l salpingo-oopherectomy  . Kidney stone removal   . Open left knee cartilage repair   . Left index finger surgery     PT Assessment/Plan/Recommendation Clinical Impression Statement: Pt admitted with chest pain and underwent cardiac cath with negative results. Pain believed to be pleurisy. Pt currently at overal modified independent level (requires incr time and/or use of device for independence). Pt agrees she is moving as well as she was when she left [place] and denies having any problems with mobility or ADLs while at son's home. States her son is very concerned re: her return home due to her dyspnea and chest pain. She requested that MD needs to discuss d/c plan with her son. PT Recommendation/Assessment: Patent does not need any further PT services No Skilled PT: Patient at baseline level of functioning;Patient is modified independent with all activity/mobility Follow Up Recommendations: No PT follow up Equipment Recommended: None recommended by PT PT Goals  PT Evaluation Precautions/Restrictions  Precautions Precautions: Fall Precaution Comments: based on decr gait velocity; no overt unsteadiness/imbalance with use of RW Restrictions Weight Bearing Restrictions: No Prior Functioning  Home Living Lives With: Sheran Spine Help From: Personal care attendant (for bathing (not recently, but in the past)) Type of Home: House Home Layout: One level Home Access: Level entry Bathroom Shower/Tub: Other (comment) (pt does a sponge bath; was able to do I'ly after  [facility]) Bathroom Toilet: Standard (has elevated seat with handles over toilet) Home Adaptive Equipment: Hospital bed;Walker - rolling;Straight cane;Bedside commode/3-in-1 Prior Function Level of Independence: Independent with gait;Requires assistive device for independence;Independent with basic ADLs Comments: Pt reports was home a few days before hospital adm (was at [facility] for rehab) Cognition Cognition Arousal/Alertness: Awake/alert Overall Cognitive Status: Appears within functional limits for tasks assessed Sensation/Coordination Sensation Additional Comments: Describes ?neuropathic pain in bil hands (see pain comments) Coordination Gross Motor Movements are Fluid and Coordinated: Yes (UEs yes; LEs limited by incr edema/obesity) Extremity Assessment RLE Assessment RLE Assessment: Exceptions to [facility] RLE AROM (degrees) RLE Overall AROM Comments: limited by edema/?lymphedema and obesity RLE Strength RLE Overall Strength Comments: at least 4/5 LLE Assessment LLE Assessment: Exceptions to [facility] LLE AROM (degrees) LLE Overall AROM Comments: limited by edema/?lymphedema and obesity LLE Strength LLE Overall Strength Comments: at least 4/5 Mobility (including Balance) Bed Mobility Bed Mobility: Yes Supine to Sit: 5: Supervision;With rails;HOB elevated (Comment degrees) Supine to Sit Details (indicate cue type and reason): HOB 25; pt has hospitatl bed at home with rails (per pt); required removal of covers (set-up) only, no cues Sitting - Scoot to Edge of Bed: 6: Modified independent (Device/Increase time) Transfers Transfers: Yes Sit to Stand: 6: Modified independent (Device/Increase time);With upper extremity assist;With armrests;From bed;From chair/3-in-1;From toilet Stand to Sit: 6: Modified independent (Device/Increase time);With upper extremity assist;With armrests;To chair/3-in-1;To toilet;Other (comment) (grab bar at toilet) Ambulation/Gait Ambulation/Gait:  Yes Ambulation/Gait Assistance: 6: Modified independent (Device/Increase time) Ambulation Distance (Feet): 80 Feet (10 ft to/from bathroom; then 60 ft) Assistive device: Rolling walker Gait Pattern: Step-to pattern;Decreased stride length;Trunk flexed Gait velocity: significantly decr  Stairs: No    Exercise    End of Session PT - End of Session Equipment Utilized During Treatment: Gait belt Activity Tolerance: Other (comment) (pt limited by dyspnea; reports since PE in 2012) Patient left: in chair;with call bell in reach Nurse Communication: Mobility status for transfers;Mobility status for ambulation General Behavior During Session: [facility] for tasks performed Cognition: [facility] for tasks performed  Jalil Lorusso 01/21/2012, 11:02 AM  Pager [phone]

## 2012-01-24 NOTE — Discharge Summary (Signed)
Physician Discharge Summary  Patient ID: Christine [name] MRN: [id number] DOB/AGE: 10-30-1930 78 y.o.  Admit date: 01/16/2012 Discharge date: 01/24/2012  Admission Diagnoses:  Chest Pain  Discharge Diagnoses:  Principal Problem:  *Chest pain, negative MI, patent coronary arteries by cath 01/17/12 Active Problems:  Uterine cancer  Pulmonary embolism, Oct 2012  CKD (chronic kidney disease) stage 4, GFR 15-29 ml/min  Morbid obesity  Lymphedema  Presence of IVC filter  Aortic stenosis, moderate  PAF (paroxysmal atrial fibrillation), 01/18/12  H/O: GI bleed, severe, not anticoagulant candidtate    Discharged Condition: stable  Hospital Course:   78 year old morbidly obese, divorced, African American Female Presents to the emergency room today secondary to chest pain. Her pain has been going on during her stay in rehabilitation and has continued since she came home from rehabilitation. They were giving her nitroglycerin sublingual at rehabilitation and since she's been home she took 2 this past Saturday. But today the pain returned. It is described as sharp and all occurring in the left anterior chest without radiation she had nausea associated with it, occasional shortness of breath, no diaphoresis.  In the emergency room she was given 4 baby aspirin but she continued with chest pain.  She was given nitroglycerin sublingual.  The 2nd NTG relieved her chest pain.  She had a VQ scan which was negative for pulmonary embolus.    She has a history of labile hypertension, hyperlipidemia, chronic lymphedema, as well as mild to moderate aortic stenosis by 2D Echo with a valve area measuring approximately 1.2 cm square.  Other history includes pulmonary embolus in the past and was placed on Coumadin for anticoagulation but did not tolerate the Coumadin with Vaginal bleeding with supra-therapeutic INR. Her Coumadin had been discontinued and an IV Filter was placed.   She was last seen by Dr. Allyson Sabal 10/31/2011  at which time her greatest complaint was instability on her feet and some dizziness. She has been hospitalized several times in December 2012, January 2013, February 2013. Some of these visits are acute on chronic renal failure though in February she was diagnosed with atypical chest pain.  She was admitted to step down, started on IV heparin and NTG.  Cardiac enzymes negative.  Left heart cath was completed on 01/17/12 and revealed Mild proximal tubular RCA stenosis up to 30%, otherwise no significant obstructive CAD(See full report below).  V/Q scan showed low probability of PE.  The patient went into Afib with RVR.  Cardizem bolus and drip started along with 250cc bolus x2.  Cardizem dripped was eventually stopped later that evening due to hypotension.  Heart rate was controlled.  She convert to NSR on 01/19/12.  Multaq was started.  PT evaluation was completed and no PT follow-up was advised.  Case manager was consulted for home health and Private duty RN/CNA is available and pt expense.  She was discharged home in stable condition.   Consults: None  Significant Diagnostic Studies:  Left Heart Cath, Medications:  Premedication: 5 mg Valium  Sedation: 0.5 mg IV Versed, 25 IV mcg Fentanyl  Contrast: 40 cc Omnipaque  Hemodynamics:  Central Aortic Pressure / Mean Aortic Pressure: 114/61  LV Pressure / LV End diastolic Pressure: 8  Aortic valve Pullback gradient: 8-10 mmHg  Coronary Angiographic Data:  Left Main: Calcified, no hemodynamically significant stenosis  Left Anterior Descending (LAD): Mild luminal irregularities, it reaches the apex  1st diagonal (D1): Smaller vessel without significant stenosis  Circumflex (LCx): Larger vessel with mild luminal  irregularities.  1st obtuse marginal: High take-off of a large vessel which take a "ramus" course, no significant stenosis.  2nd obtuse marginal: Smaller distal vessel  Right Coronary Artery: Large, dominant vessel with mild tubular proximal  stenosis up to 30%  right ventricle branch of right coronary artery: No significant stenosis  posterior descending artery: No significant stenosis  posterior lateral branch: No significant stenosis Impression:  1. Mild proximal tubular RCA stenosis up to 30%, otherwise no significant obstructive CAD  2. The aortic valve did not appear calcified and was easily crossed with a JR4 catheter. There was a small 8 mmHg pullback gradient.  3. LVEDP = 8 mmHg, which is low for a patient with moderate aortic stenosis.  Plan:  1. Medical management and work-up of non-cardiac causes of chest pain.  2. Question severity of reported aortic stenosis.  The case and results was discussed with the patient (and family).  The case and results was not discussed with the patient's PCP.  The case and results was discussed with the patient's Cardiologist.  Time Spend Directly with Patient:  45 minutes  Chrystie Nose, MD, [facility]  Attending Cardiologist  [facility]  PORTABLE CHEST - 1 VIEW  Comparison: Portable exam 1233 hours compared to 12/03/2011  Findings:  Enlargement of cardiac silhouette with pulmonary vascular  congestion.  Perihilar infiltrates question pulmonary edema/CHF.  No definite pleural effusion or pneumothorax.  Bones appear demineralized.  IMPRESSION:  Probable CHF.  CBC    Component Value Date/Time   WBC 10.3 01/19/2012 0500   WBC 14.6* 03/13/2011 1558   WBC 14.6* 03/13/2011 1558   RBC 3.34* 01/19/2012 0500   RBC 3.95 03/13/2011 1558   RBC 3.95 03/13/2011 1558   HGB 9.6* 01/19/2012 0500   HGB 12.6 03/13/2011 1558   HGB 12.6 03/13/2011 1558   HCT 30.7* 01/19/2012 0500   HCT 37.3 03/13/2011 1558   HCT 37.3 03/13/2011 1558   PLT 484* 01/19/2012 0500   PLT 269 03/13/2011 1558   PLT 269 03/13/2011 1558   MCV 91.9 01/19/2012 0500   MCV 94.6 03/13/2011 1558   MCV 94.6 03/13/2011 1558   MCH 28.7 01/19/2012 0500   MCH 32.0 03/13/2011 1558   MCH 32.0 03/13/2011 1558   MCHC  31.3 01/19/2012 0500   MCHC 33.8 03/13/2011 1558   MCHC 33.8 03/13/2011 1558   RDW 15.6* 01/19/2012 0500   RDW 14.4 03/13/2011 1558   RDW 14.4 03/13/2011 1558   LYMPHSABS 1.9 01/16/2012 1200   LYMPHSABS 0.7* 03/13/2011 1558   LYMPHSABS 0.7* 03/13/2011 1558   MONOABS 1.5* 01/16/2012 1200   MONOABS 0.1 03/13/2011 1558   MONOABS 0.1 03/13/2011 1558   EOSABS 0.2 01/16/2012 1200   EOSABS 0.0 03/13/2011 1558   EOSABS 0.0 03/13/2011 1558   BASOSABS 0.2* 01/16/2012 1200   BASOSABS 0.0 03/13/2011 1558   BASOSABS 0.0 03/13/2011 1558   BMET    Component Value Date/Time   NA 135 01/20/2012 0720   K 4.3 01/20/2012 0720   CL 99 01/20/2012 0720   CO2 28 01/20/2012 0720   GLUCOSE 84 01/20/2012 0720   BUN 27* 01/20/2012 0720   CREATININE 1.48* 01/20/2012 0720   CALCIUM 9.1 01/20/2012 0720   GFRNONAA 33* 01/20/2012 0720   GFRAA 38* 01/20/2012 0720     Treatments:  Lasix, Multaq, Left heart cath, V/Q scan.  Discharge Exam: Blood pressure 98/58, pulse 94, temperature 98.4 F (36.9 C), temperature source Oral, resp. rate 18, height  5\' 6"  (1.676 m), weight 137.1 kg (302 lb 4 oz), SpO2 97.00%.   Disposition: 01-Home or Self Care  Discharge Orders    Future Appointments: Provider: Department: Dept Phone: Center:   05/19/2012 10:00 AM Billie Lade, MD [facility] [phone] None   07/09/2012 9:15 AM Corwin Levins, MD [facility] [phone] [facility]     Future Orders Please Complete By Expires   Diet - low sodium heart healthy      Increase activity slowly        Medication List  As of 01/24/2012  2:25 PM   STOP taking these medications         amLODipine 10 MG tablet         TAKE these medications         aspirin 81 MG EC tablet   Take 1 tablet (81 mg total) by mouth daily.      atorvastatin 10 MG tablet   Commonly known as: LIPITOR   Take 1 tablet (10 mg total) by mouth daily at 6 PM.      cholecalciferol 1000 UNITS tablet   Commonly known as: VITAMIN D   Take 1,000 Units by mouth daily.      docusate sodium  100 MG capsule   Commonly known as: COLACE   Take 200 mg by mouth 2 (two) times daily. For diarrhea      dronedarone 400 MG tablet   Commonly known as: MULTAQ   Take 1 tablet (400 mg total) by mouth 2 (two) times daily with a meal.      ezetimibe 10 MG tablet   Commonly known as: ZETIA   Take 10 mg by mouth daily.      furosemide 40 MG tablet   Commonly known as: LASIX   Take 1 tablet (40 mg total) by mouth daily.      HYDROcodone-acetaminophen 10-325 MG per tablet   Commonly known as: NORCO   Take 1 tablet by mouth every 6 (six) hours as needed. For pain.      metoCLOPramide 5 MG tablet   Commonly known as: REGLAN   Take 1 tablet (5 mg total) by mouth 4 (four) times daily as needed.      metoprolol succinate 25 MG 24 hr tablet   Commonly known as: TOPROL-XL   Take 25 mg by mouth daily.      nitroGLYCERIN 0.4 MG SL tablet   Commonly known as: NITROSTAT   Place 0.4 mg under the tongue every 5 (five) minutes as needed. For chest pain. Do not exceed 3 tablets. If pain is not resolved after 3rd tablet call 911.      omeprazole 20 MG capsule   Commonly known as: PRILOSEC   Take 20 mg by mouth daily.      polyethylene glycol packet   Commonly known as: MIRALAX / GLYCOLAX   Take 17 g by mouth daily.      senna 8.6 MG Tabs   Commonly known as: SENOKOT   Take 1 tablet by mouth.      zolpidem 5 MG tablet   Commonly known as: AMBIEN   Take 5 mg by mouth at bedtime as needed. For sleep.           Follow-up Information    Follow up with Runell Gess, MD. (Our office will call you with the appointment date and time.)    Contact information:   [address] [place] [address] [phone]  SignedDwana Melena 01/24/2012, 2:25 PM  I saw Ms. Rosha on the day of final discharge.  She had been stable for discharge for ~2 days prior her actual discharge waiting evaluation for appropriate discharge planning re: SNF vs home  health.  She was evaluated by PT on the day of discharge - no SNF recommendations.  She was therefore discharged.   I agree with Mr. Jasper Riling brief summary.  Marykay Lex, M.D., M.S. [facility] [address] [place], [place]  [address]  [phone] Pager # [phone]  01/24/2012 3:37 PM

## 2012-01-28 IMAGING — CR DG CHEST 2V
2 series · 2 of 2 positions shown · non-contrast
Comparison: Chest x-ray 08/09/2011.

CLINICAL DATA: Cough.

CHEST - 2 VIEW

[x chest ap]
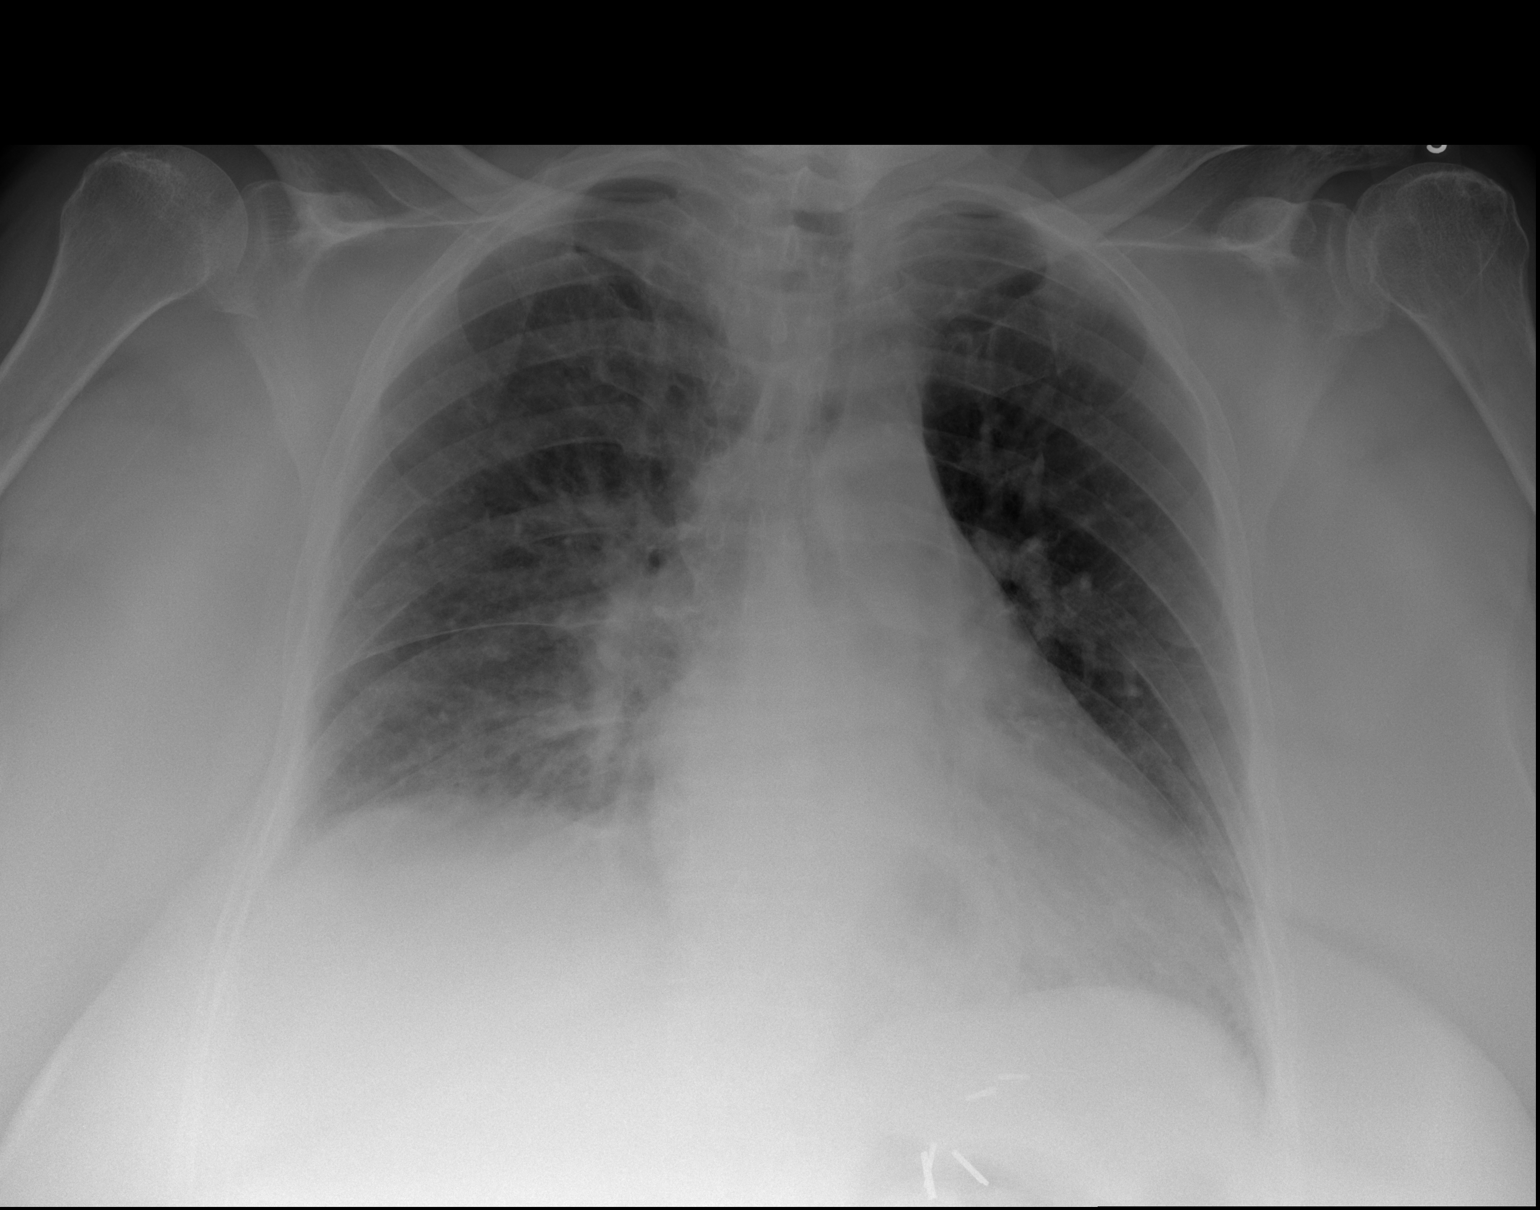

[w chest lat]
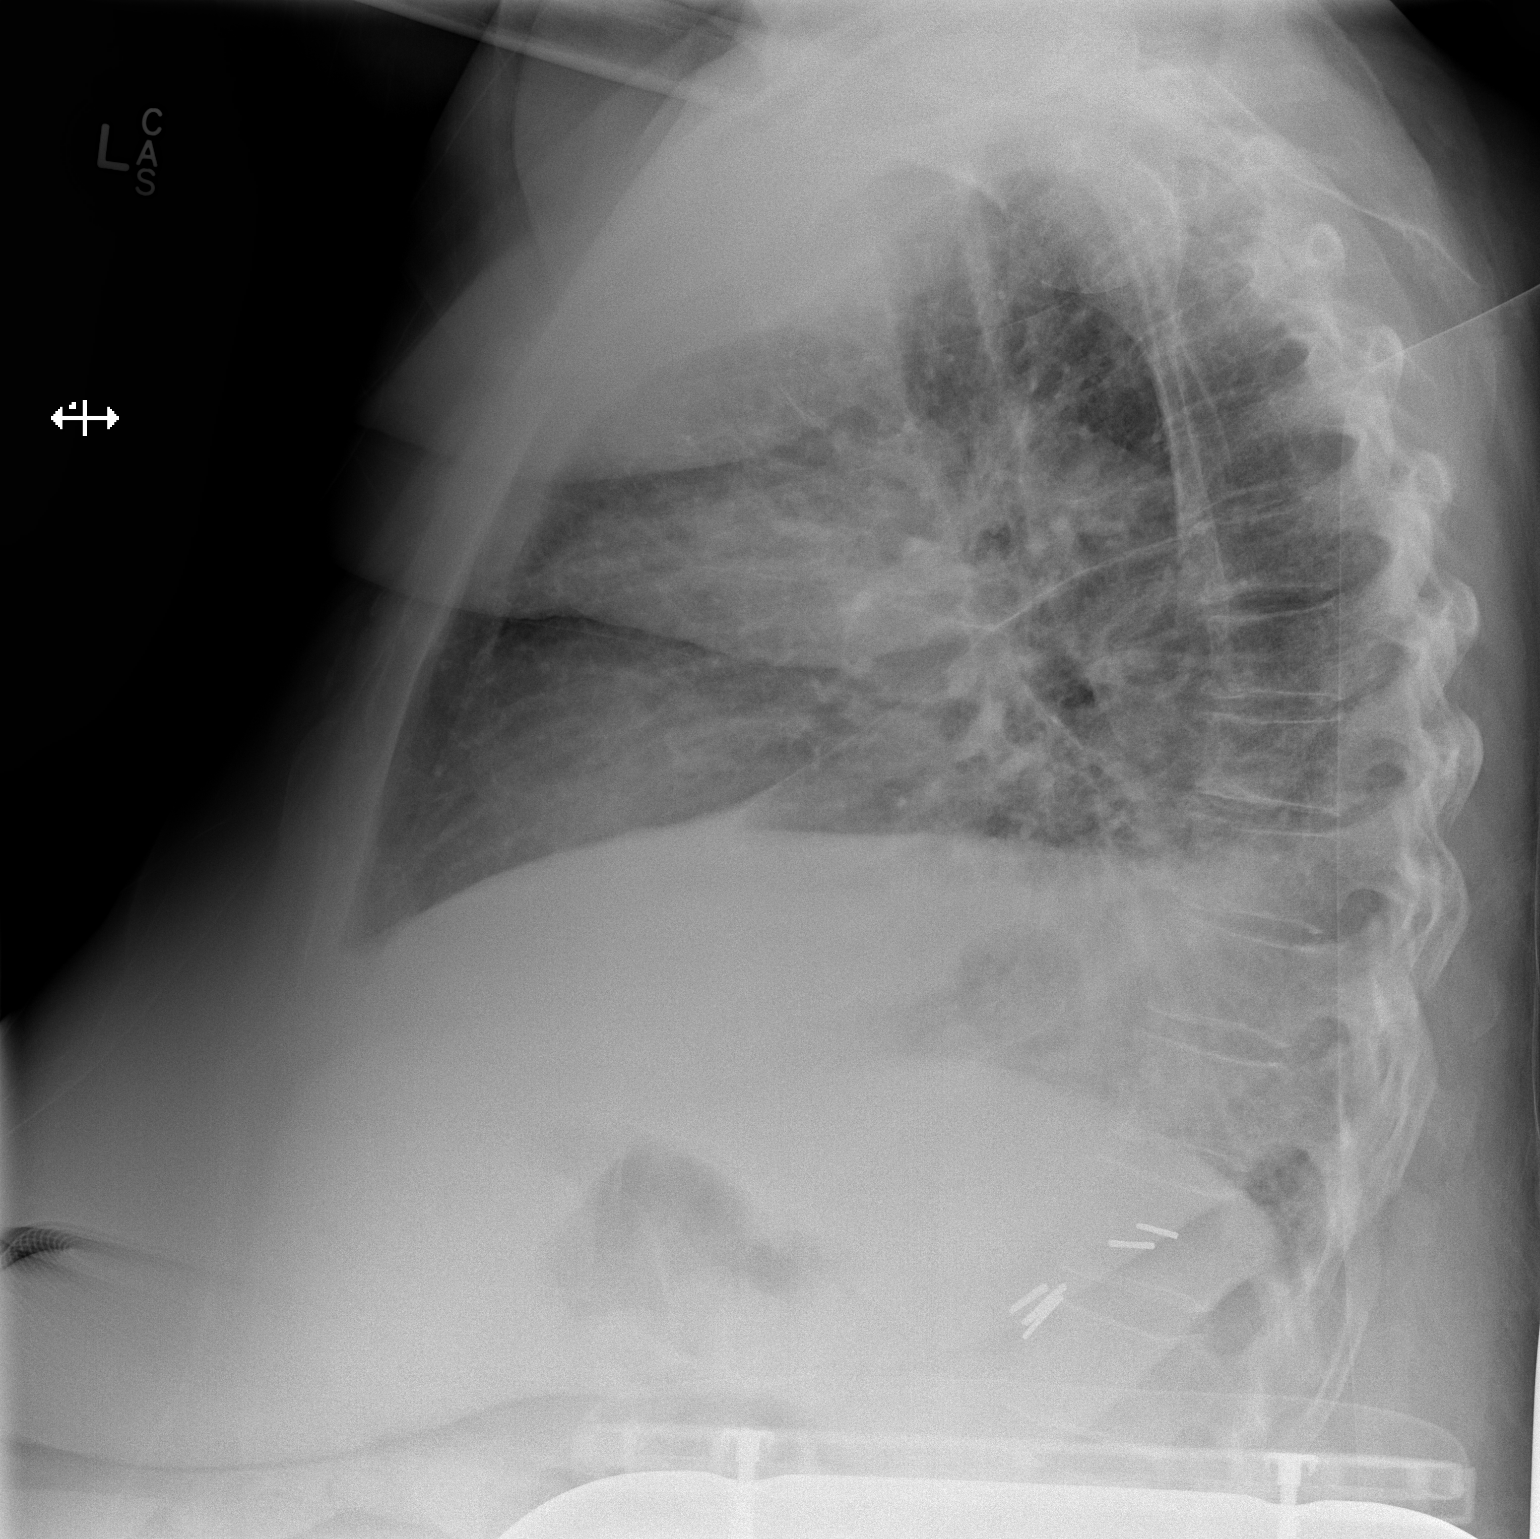

[2 of 2 positions shown; findings below may reference images not displayed]

FINDINGS: The heart is borderline enlarged but stable.  The
mediastinal and hilar contours are unchanged.  Chronic underlying
bronchitic type lung changes with probable superimposed bronchitis
or interstitial pneumonitis.  No definite infiltrates.  Moderate
elevation of the right hemidiaphragm is noted.  A hiatal hernia is
stable.
IMPRESSION: Chronic bronchitic type lung changes with probable superimposed
bronchitis or interstitial pneumonitis.

## 2012-02-13 ENCOUNTER — Other Ambulatory Visit: Payer: Self-pay

## 2012-02-13 MED ORDER — HYDROCODONE-ACETAMINOPHEN 10-325 MG PO TABS: 1.0000 | ORAL_TABLET | Freq: Four times a day (QID) | ORAL | Status: DC | PRN

## 2012-02-13 MED ORDER — METOPROLOL SUCCINATE ER 25 MG PO TB24: 25.0000 mg | ORAL_TABLET | Freq: Every day | ORAL | Status: DC

## 2012-02-13 NOTE — Telephone Encounter (Signed)
toprol done per emr  Pain med Done hardcopy to [facility]

## 2012-02-13 NOTE — Telephone Encounter (Signed)
Faxed hardcopy to pharmacy. 

## 2012-03-13 ENCOUNTER — Telehealth: Payer: Self-pay | Admitting: Internal Medicine

## 2012-03-13 NOTE — Telephone Encounter (Signed)
Received 114 pages from [facility]. Sent to Dr. Jonny Ruiz. 03/13/12/SD.

## 2012-03-14 ENCOUNTER — Encounter: Payer: Self-pay | Admitting: Internal Medicine

## 2012-03-14 DIAGNOSIS — K5790 Diverticulosis of intestine, part unspecified, without perforation or abscess without bleeding: Secondary | ICD-10-CM

## 2012-03-14 DIAGNOSIS — N200 Calculus of kidney: Secondary | ICD-10-CM

## 2012-03-14 DIAGNOSIS — I2510 Atherosclerotic heart disease of native coronary artery without angina pectoris: Secondary | ICD-10-CM

## 2012-03-14 HISTORY — DX: Diverticulosis of intestine, part unspecified, without perforation or abscess without bleeding: K57.90

## 2012-03-14 HISTORY — DX: Atherosclerotic heart disease of native coronary artery without angina pectoris: I25.10

## 2012-03-14 HISTORY — DX: Calculus of kidney: N20.0

## 2012-03-17 ENCOUNTER — Ambulatory Visit: Payer: Medicare Other | Admitting: Radiation Oncology

## 2012-03-19 ENCOUNTER — Ambulatory Visit (INDEPENDENT_AMBULATORY_CARE_PROVIDER_SITE_OTHER): Payer: Medicare Other | Admitting: Internal Medicine

## 2012-03-19 ENCOUNTER — Encounter: Payer: Self-pay | Admitting: Internal Medicine

## 2012-03-19 VITALS — BP 128/68 | HR 83 | Temp 98.5°F | Ht 66.0 in | Wt 287.0 lb

## 2012-03-19 DIAGNOSIS — R0989 Other specified symptoms and signs involving the circulatory and respiratory systems: Secondary | ICD-10-CM

## 2012-03-19 DIAGNOSIS — R110 Nausea: Secondary | ICD-10-CM

## 2012-03-19 DIAGNOSIS — I10 Essential (primary) hypertension: Secondary | ICD-10-CM

## 2012-03-19 DIAGNOSIS — R112 Nausea with vomiting, unspecified: Secondary | ICD-10-CM

## 2012-03-19 MED ORDER — OMEPRAZOLE 20 MG PO CPDR: 20.0000 mg | DELAYED_RELEASE_CAPSULE | Freq: Every day | ORAL | Status: DC

## 2012-03-19 MED ORDER — ONDANSETRON HCL 4 MG PO TABS: 4.0000 mg | ORAL_TABLET | Freq: Three times a day (TID) | ORAL | Status: AC | PRN

## 2012-03-19 MED ORDER — HYDROCODONE-ACETAMINOPHEN 10-325 MG PO TABS: 1.0000 | ORAL_TABLET | Freq: Four times a day (QID) | ORAL | Status: DC | PRN

## 2012-03-19 NOTE — Patient Instructions (Addendum)
.  Take all new medications as prescribed Continue all other medications as before You are given the refill of the pain medication hardcopy today Please keep your appointments with your specialists as you have planned - Dr Lucianne Muss June 19 You will be contacted regarding the referral for: PFT's (lung testing breathing test), and GI

## 2012-03-22 ENCOUNTER — Emergency Department (HOSPITAL_COMMUNITY)
Admission: EM | Admit: 2012-03-22 | Discharge: 2012-03-22 | Disposition: A | Discharge status: Home / Self Care | Payer: Medicare Other | Attending: Emergency Medicine | Admitting: Emergency Medicine

## 2012-03-22 ENCOUNTER — Encounter (HOSPITAL_COMMUNITY): Payer: Self-pay

## 2012-03-22 ENCOUNTER — Emergency Department (HOSPITAL_COMMUNITY): Payer: Medicare Other

## 2012-03-22 ENCOUNTER — Other Ambulatory Visit: Payer: Self-pay

## 2012-03-22 DIAGNOSIS — K219 Gastro-esophageal reflux disease without esophagitis: Secondary | ICD-10-CM | POA: Insufficient documentation

## 2012-03-22 DIAGNOSIS — Z86711 Personal history of pulmonary embolism: Secondary | ICD-10-CM | POA: Insufficient documentation

## 2012-03-22 DIAGNOSIS — M129 Arthropathy, unspecified: Secondary | ICD-10-CM | POA: Insufficient documentation

## 2012-03-22 DIAGNOSIS — N182 Chronic kidney disease, stage 2 (mild): Secondary | ICD-10-CM | POA: Insufficient documentation

## 2012-03-22 DIAGNOSIS — I2510 Atherosclerotic heart disease of native coronary artery without angina pectoris: Secondary | ICD-10-CM | POA: Insufficient documentation

## 2012-03-22 DIAGNOSIS — Z7982 Long term (current) use of aspirin: Secondary | ICD-10-CM | POA: Insufficient documentation

## 2012-03-22 DIAGNOSIS — Z79899 Other long term (current) drug therapy: Secondary | ICD-10-CM | POA: Insufficient documentation

## 2012-03-22 DIAGNOSIS — R5381 Other malaise: Secondary | ICD-10-CM | POA: Insufficient documentation

## 2012-03-22 DIAGNOSIS — I890 Lymphedema, not elsewhere classified: Secondary | ICD-10-CM | POA: Insufficient documentation

## 2012-03-22 DIAGNOSIS — R000 Tachycardia, unspecified: Secondary | ICD-10-CM | POA: Insufficient documentation

## 2012-03-22 DIAGNOSIS — I509 Heart failure, unspecified: Secondary | ICD-10-CM

## 2012-03-22 DIAGNOSIS — Z860100 Personal history of colon polyps, unspecified: Secondary | ICD-10-CM | POA: Insufficient documentation

## 2012-03-22 DIAGNOSIS — I252 Old myocardial infarction: Secondary | ICD-10-CM | POA: Insufficient documentation

## 2012-03-22 DIAGNOSIS — I359 Nonrheumatic aortic valve disorder, unspecified: Secondary | ICD-10-CM | POA: Insufficient documentation

## 2012-03-22 DIAGNOSIS — Z8542 Personal history of malignant neoplasm of other parts of uterus: Secondary | ICD-10-CM | POA: Insufficient documentation

## 2012-03-22 DIAGNOSIS — I4891 Unspecified atrial fibrillation: Secondary | ICD-10-CM | POA: Insufficient documentation

## 2012-03-22 DIAGNOSIS — K029 Dental caries, unspecified: Secondary | ICD-10-CM | POA: Insufficient documentation

## 2012-03-22 DIAGNOSIS — I129 Hypertensive chronic kidney disease with stage 1 through stage 4 chronic kidney disease, or unspecified chronic kidney disease: Secondary | ICD-10-CM | POA: Insufficient documentation

## 2012-03-22 DIAGNOSIS — E119 Type 2 diabetes mellitus without complications: Secondary | ICD-10-CM | POA: Insufficient documentation

## 2012-03-22 DIAGNOSIS — R0602 Shortness of breath: Secondary | ICD-10-CM | POA: Insufficient documentation

## 2012-03-22 DIAGNOSIS — I350 Nonrheumatic aortic (valve) stenosis: Secondary | ICD-10-CM

## 2012-03-22 DIAGNOSIS — K5730 Diverticulosis of large intestine without perforation or abscess without bleeding: Secondary | ICD-10-CM | POA: Insufficient documentation

## 2012-03-22 LAB — BLOOD GAS, VENOUS
Acid-Base Excess: 2.5 mmol/L — ABNORMAL HIGH (ref 0.0–2.0)
Bicarbonate: 26.6 meq/L — ABNORMAL HIGH (ref 20.0–24.0)
O2 Saturation: 56.5 %
Patient temperature: 98.6
TCO2: 25.1 mmol/L (ref 0–100)
pCO2, Ven: 41.3 mmHg — ABNORMAL LOW (ref 45.0–50.0)
pH, Ven: 7.425 — ABNORMAL HIGH (ref 7.250–7.300)
pO2, Ven: 31.9 mmHg (ref 30.0–45.0)

## 2012-03-22 LAB — COMPREHENSIVE METABOLIC PANEL WITH GFR
ALT: 8 U/L (ref 0–35)
AST: 19 U/L (ref 0–37)
Albumin: 2.2 g/dL — ABNORMAL LOW (ref 3.5–5.2)
Alkaline Phosphatase: 59 U/L (ref 39–117)
BUN: 22 mg/dL (ref 6–23)
CO2: 25 meq/L (ref 19–32)
Calcium: 9 mg/dL (ref 8.4–10.5)
Chloride: 95 meq/L — ABNORMAL LOW (ref 96–112)
Creatinine, Ser: 1.75 mg/dL — ABNORMAL HIGH (ref 0.50–1.10)
GFR calc Af Amer: 31 mL/min — ABNORMAL LOW
GFR calc non Af Amer: 27 mL/min — ABNORMAL LOW
Glucose, Bld: 107 mg/dL — ABNORMAL HIGH (ref 70–99)
Potassium: 3.7 meq/L (ref 3.5–5.1)
Sodium: 132 meq/L — ABNORMAL LOW (ref 135–145)
Total Bilirubin: 0.2 mg/dL — ABNORMAL LOW (ref 0.3–1.2)
Total Protein: 7.5 g/dL (ref 6.0–8.3)

## 2012-03-22 LAB — CBC
HCT: 28.9 % — ABNORMAL LOW (ref 36.0–46.0)
Hemoglobin: 9.2 g/dL — ABNORMAL LOW (ref 12.0–15.0)
MCH: 28.5 pg (ref 26.0–34.0)
MCHC: 31.8 g/dL (ref 30.0–36.0)
MCV: 89.5 fL (ref 78.0–100.0)
Platelets: 506 K/uL — ABNORMAL HIGH (ref 150–400)
RBC: 3.23 MIL/uL — ABNORMAL LOW (ref 3.87–5.11)
RDW: 15.5 % (ref 11.5–15.5)
WBC: 18.3 K/uL — ABNORMAL HIGH (ref 4.0–10.5)

## 2012-03-22 LAB — DIFFERENTIAL
Eosinophils Relative: 1 % (ref 0–5)
Lymphs Abs: 1.6 10*3/uL (ref 0.7–4.0)
Monocytes Relative: 9 % (ref 3–12)
Neutro Abs: 14.9 10*3/uL — ABNORMAL HIGH (ref 1.7–7.7)

## 2012-03-22 LAB — TROPONIN I: Troponin I: <0.3 ng/mL (ref <0.30)

## 2012-03-22 MED ORDER — FUROSEMIDE 40 MG PO TABS: 40.0000 mg | ORAL_TABLET | Freq: Two times a day (BID) | ORAL | Status: DC

## 2012-03-22 MED ORDER — FUROSEMIDE 10 MG/ML IJ SOLN
120.0000 mg | Freq: Once | INTRAVENOUS | Status: AC
  Administered 2012-03-22: 120 mg via INTRAVENOUS
  Filled 2012-03-22: qty 12

## 2012-03-22 NOTE — Discharge Instructions (Signed)
Your evaluation in the emergency department today has resulted in a diagnosis of heart failure exacerbation.  Given your history of multiple medical problems, your known aortic stenosis, your heart failure, it is very important that you continue to have your condition managed by your team of physicians.  As we discussed, your preference to go home is appropriate, provided that you take your medications as a newly directed, and followup as discussed.  If you develop any new, or concerning changes in your condition, please return to the emergency department immediately.

## 2012-03-22 NOTE — ED Provider Notes (Signed)
History     CSN: [id number]  Arrival date & time 03/22/12  1246   First MD Initiated Contact with Patient 03/22/12 1318      Chief Complaint  Patient presents with  . Chest Pain  . Shortness of Breath    (Consider location/radiation/quality/duration/timing/severity/associated sxs/prior treatment) HPI The patient presents dyspnea, fatigue, chest pain.  She notes that since a hospitalization in April she was generally well until about one week ago.  Since that time she's had progressive fatigue and dyspnea.  She denies significant pain, fever, chills, cough throughout the past few days until 2 days ago.  About that time she developed tightness across her anterior chest, diffusely.  All her symptoms have been progressive she has not spoken with her physician, has not taken any medication for relief.  She denies new confusion, disorientation.  The patient is obese at baseline with notable lymphedema, which she denies any changes from. Past Medical History  Diagnosis Date  . GERD (gastroesophageal reflux disease)   . Hypertension   . Diabetes mellitus     diet controlled  . Morbid obesity   . Arthritis   . Chronic kidney disease     nephrolithiasis left kidney,s/p open resection  . Status post arthroscopic surgery of right knee   . Diverticulosis of sigmoid colon   . Chronic back pain   . Pneumonia 07/2011    hx  . Pulmonary embolism 07/2011    hx  . Uterine cancer     endometrial high grade adenocarcinoma  . Uterus cancer, sarcoma     high grade invasive carcinosarcoma  . Myocardial infarction 1989  . Bronchitis   . H/O cardiomegaly   . Lymphedema 09/29/2011  . Diabetes mellitus, diet controlled 10/03/2011  . Chronic kidney disease, stage II (mild) 09/20/2011  . Acute diastolic congestive heart failure, 2D Oct 2012 09/29/2011  . Presence of IVC filter 01/08/2012  . History of colon polyps 01/08/2012  . Chest pain 01/16/2012  . Aortic stenosis, moderate 01/16/2012  . PAF  (paroxysmal atrial fibrillation), 01/18/12 01/18/2012  . CAD (coronary artery disease) 03/14/2012  . Nephrolithiasis 03/14/2012  . Diverticulosis 03/14/2012    Past Surgical History  Procedure Date  . Colonoscopy     negative  . Knee arthroscopy     right knee  . Cataract extraction, bilateral   . Tubal ligation 1973  . Abdominal hysterectomy 10/05/2008    total with b/l salpingo-oopherectomy  . Kidney stone removal   . Open left knee cartilage repair   . Left index finger surgery     Family History  Problem Relation Age of Onset  . Breast cancer Sister     1st sister  . Colon cancer Sister     2nd sister  . Cervical cancer Sister     3rd sister    History  Substance Use Topics  . Smoking status: Never Smoker   . Smokeless tobacco: Never Used  . Alcohol Use: No    OB History    Grav Para Term Preterm Abortions TAB SAB Ect Mult Living   9 5              Review of Systems  Constitutional:       HPI  HENT:       HPI otherwise negative  Eyes: Negative.   Respiratory:       HPI, otherwise negative  Cardiovascular:       HPI, otherwise nmegative  Gastrointestinal: Negative for vomiting.  Genitourinary:       HPI, otherwise negative  Musculoskeletal:       HPI, otherwise negative  Skin: Negative.   Neurological: Negative for syncope.    Allergies  Review of patient's allergies indicates no known allergies.  Home Medications   Current Outpatient Rx  Name Route Sig Dispense Refill  . ASPIRIN 81 MG PO TBEC Oral Take 1 tablet (81 mg total) by mouth daily.    [name] VITAMIN D 1000 UNITS PO TABS Oral Take 1,000 Units by mouth daily.    [name] DOCUSATE SODIUM 100 MG PO CAPS Oral Take 200 mg by mouth 2 (two) times daily. For diarrhea    . DRONEDARONE HCL 400 MG PO TABS Oral Take 1 tablet (400 mg total) by mouth 2 (two) times daily with a meal. 60 tablet 11  . EZETIMIBE 10 MG PO TABS Oral Take 10 mg by mouth daily.     . FUROSEMIDE 40 MG PO TABS Oral Take 1 tablet (40 mg  total) by mouth daily. 30 tablet   . HYDROCODONE-ACETAMINOPHEN 10-325 MG PO TABS Oral Take 1 tablet by mouth every 6 (six) hours as needed. For pain. 60 tablet 1  . METOPROLOL SUCCINATE ER 25 MG PO TB24 Oral Take 1 tablet (25 mg total) by mouth daily. 30 tablet 11  . NITROGLYCERIN 0.4 MG SL SUBL Sublingual Place 0.4 mg under the tongue every 5 (five) minutes as needed. For chest pain. Do not exceed 3 tablets. If pain is not resolved after 3rd tablet call 911.    [name] OMEPRAZOLE 20 MG PO CPDR Oral Take 1 capsule (20 mg total) by mouth daily. 90 capsule 3  . ONDANSETRON HCL 4 MG PO TABS Oral Take 1 tablet (4 mg total) by mouth every 8 (eight) hours as needed for nausea. 40 tablet 2  . POLYETHYLENE GLYCOL 3350 PO PACK Oral Take 17 g by mouth daily.    . SENNA 8.6 MG PO TABS Oral Take 1 tablet by mouth.    . ZOLPIDEM TARTRATE 5 MG PO TABS Oral Take 5 mg by mouth at bedtime as needed. For sleep.    [name] METOCLOPRAMIDE HCL 5 MG PO TABS Oral Take 1 tablet (5 mg total) by mouth 4 (four) times daily as needed.      BP 120/54  Pulse 105  Temp(Src) 98.6 F (37 C) (Oral)  Resp 18  SpO2 96%  Physical Exam  Nursing note and vitals reviewed. Constitutional: She is oriented to person, place, and time.       Morbidly obese female  HENT:  Mouth/Throat: Oropharynx is clear and moist and mucous membranes are normal. Abnormal dentition. Dental caries present.  Eyes: Conjunctivae and EOM are normal. Pupils are equal, round, and reactive to light.  Cardiovascular: Normal rate and regular rhythm.   Pulmonary/Chest: No stridor. Tachypnea noted. She has decreased breath sounds. She has no wheezes.  Abdominal: Soft. Normal appearance. There is no tenderness. There is no rigidity, no rebound and no guarding.  Musculoskeletal:       Diffuse lymphedematous changes in her lower extremities.  She notes no significant change in  Neurological: She is alert and oriented to person, place, and time. No cranial nerve deficit.    Skin: Skin is warm and dry.  Psychiatric: She has a normal mood and affect.    ED Course  Procedures (including critical care time)  Labs Reviewed  CBC - Abnormal; Notable for the following:    WBC 18.3 (*)  RBC 3.23 (*)    Hemoglobin 9.2 (*)    HCT 28.9 (*)    Platelets 506 (*)    All other components within normal limits  BLOOD GAS, VENOUS - Abnormal; Notable for the following:    pH, Ven 7.425 (*)    pCO2, Ven 41.3 (*)    Bicarbonate 26.6 (*)    Acid-Base Excess 2.5 (*)    All other components within normal limits  DIFFERENTIAL  COMPREHENSIVE METABOLIC PANEL  TROPONIN I  PRO B NATRIURETIC PEPTIDE  BLOOD GAS, VENOUS   Dg Chest 2 View  03/22/2012  *RADIOLOGY REPORT*  Clinical Data: Shortness of breath, chest pain  CHEST - 2 VIEW  Comparison: 01/18/2012  Findings: Cardiomegaly is noted.  Central mild vascular congestion and mild perihilar interstitial prominence suspicious for mild interstitial edema.  Probable small bilateral pleural effusion with bilateral basilar atelectasis or infiltrate.  IMPRESSION: Central mild vascular congestion and mild perihilar interstitial prominence suspicious for mild interstitial edema.  Probable small bilateral pleural effusion with bilateral basilar atelectasis or infiltrate.  Original Report Authenticated By: Natasha Mead, M.D.     No diagnosis found.   Patient's prior charts reviewed, including cath (01/17/12)  Cardiac:  91 sr, normal Pulse ox 97% Ryan, abnormal   Date: 03/22/2012  Rate: 103  Rhythm: sinus tachycardia  QRS Axis: left  Intervals: normal  ST/T Wave abnormalities: nonspecific T wave changes  Conduction Disutrbances:none  Narrative Interpretation:   Old EKG Reviewed: changes noted ABNORMAL, non-ischemic  MDM  This morbidly obese elderly female with multiple medical problems now presents with one week.  Notably, the patient has a history of CHF, as well as aortic stenosis.  Given the description of one week of  complaints, with dull chest discomfort, there is low suspicion for ongoing significant ischemia.  A review of the patient's catheterization from 2 months ago makes this less likely as well.  The patient's ECG is nonischemic, her troponins negative, which were ongoing changes, would likely be positive.  The patient's labs are similar to multiple prior results, including pancytosis.  She does have an elevated BNP (>800) compared to her most recent labs, as well as CXR findings c/w CHF exacerbation.  Although the patient does have multiple medical problems, the absence of indications for other acute interventions beyond Lasix, absent evidence of ongoing infection, cardiac ischemia, admission changes, fever, make her appropriate for continued evaluation with her team of physicians.  Notably, the patient's daughter is a hospital employee, and she notes that they will be able to take care of the patient and have her followup in 36 hours with her cardiologist, and the heart failure clinic.       Gerhard Munch, MD 03/22/12 1540

## 2012-03-22 NOTE — ED Notes (Signed)
Pt in from home with c/o chest pain and sob for 2 days states pain is non radiating denies nausea states pain is aching on the left side states comes and goes pt states weakness and loss of appetite

## 2012-03-23 ENCOUNTER — Encounter: Payer: Self-pay | Admitting: Internal Medicine

## 2012-03-23 DIAGNOSIS — R110 Nausea: Secondary | ICD-10-CM | POA: Insufficient documentation

## 2012-03-23 NOTE — Assessment & Plan Note (Signed)
Unclear etiology, wt ok, For zofran and PPI,  Refer GI

## 2012-03-23 NOTE — Assessment & Plan Note (Signed)
Unclear etiology, exam benign today, doubt aspiration, for PFT's, cont same tx for now

## 2012-03-23 NOTE — Assessment & Plan Note (Signed)
stable overall by hx and exam, most recent data reviewed with pt, and pt to continue medical treatment as before Lab Results  Component Value Date   HGBA1C 5.8* 01/17/2012    

## 2012-03-23 NOTE — Progress Notes (Signed)
Subjective:    Patient ID: Christine [name], female    DOB: 1931-09-27, 78 y.o.   MRN: [id number]  HPI  Here to f/u;  Was hosp'd apr 3-11 after April 3 cath with RCA 30% stenosis, r/o'd for MI.  Has some lower bilat chest soreness today at the costal margins, still with some dyspnea unclear etiology and tends to have short gasps, nausea, fatigue and gagging with very rare dry heave;  No aspiration. Pt denies chest pain, increased sob or doe, wheezing, orthopnea, PND, increased LE swelling, palpitations, dizziness or syncope. Except for the above.  Reglan not really working for the nausea, not taking prilosec.  No constipation. Has seen Dr Henrine Screws since d/c and per pt doing ok, with carotid dopplers neg. Does have home care and PT has been ordered, to start soon per son with her today.  Pain overall controlled, needs refill hydrocodone.   Pt denies fever, wt loss, night sweats, loss of appetite, or other constitutional symptoms   Denies urinary symptoms such as dysuria, frequency, urgency,or hematuria.  Denies abd pain, bowel change or blood.  Denies worsening depressive symptoms, suicidal ideation, or panic  Does also mention has bilat hand pain - ? CTS like, s/p cortison per Dr Lucianne Muss and f/u June 19 Past Medical History  Diagnosis Date  . GERD (gastroesophageal reflux disease)   . Hypertension   . Diabetes mellitus     diet controlled  . Morbid obesity   . Arthritis   . Chronic kidney disease     nephrolithiasis left kidney,s/p open resection  . Status post arthroscopic surgery of right knee   . Diverticulosis of sigmoid colon   . Chronic back pain   . Pneumonia 07/2011    hx  . Pulmonary embolism 07/2011    hx  . Uterine cancer     endometrial high grade adenocarcinoma  . Uterus cancer, sarcoma     high grade invasive carcinosarcoma  . Myocardial infarction 1989  . Bronchitis   . H/O cardiomegaly   . Lymphedema 09/29/2011  . Diabetes mellitus, diet controlled 10/03/2011  . Chronic  kidney disease, stage II (mild) 09/20/2011  . Acute diastolic congestive heart failure, 2D Oct 2012 09/29/2011  . Presence of IVC filter 01/08/2012  . History of colon polyps 01/08/2012  . Chest pain 01/16/2012  . Aortic stenosis, moderate 01/16/2012  . PAF (paroxysmal atrial fibrillation), 01/18/12 01/18/2012  . CAD (coronary artery disease) 03/14/2012  . Nephrolithiasis 03/14/2012  . Diverticulosis 03/14/2012   Past Surgical History  Procedure Date  . Colonoscopy     negative  . Knee arthroscopy     right knee  . Cataract extraction, bilateral   . Tubal ligation 1973  . Abdominal hysterectomy 10/05/2008    total with b/l salpingo-oopherectomy  . Kidney stone removal   . Open left knee cartilage repair   . Left index finger surgery     reports that she has never smoked. She has never used smokeless tobacco. She reports that she does not drink alcohol or use illicit drugs. family history includes Breast cancer in her sister; Cervical cancer in her sister; and Colon cancer in her sister. No Known Allergies Current Outpatient Prescriptions on File Prior to Visit  Medication Sig Dispense Refill  . aspirin EC 81 MG EC tablet Take 1 tablet (81 mg total) by mouth daily.      . cholecalciferol (VITAMIN D) 1000 UNITS tablet Take 1,000 Units by mouth daily.      [name]  docusate sodium (COLACE) 100 MG capsule Take 200 mg by mouth 2 (two) times daily. For diarrhea      . dronedarone (MULTAQ) 400 MG tablet Take 1 tablet (400 mg total) by mouth 2 (two) times daily with a meal.  60 tablet  11  . ezetimibe (ZETIA) 10 MG tablet Take 10 mg by mouth daily.       . metoprolol succinate (TOPROL-XL) 25 MG 24 hr tablet Take 1 tablet (25 mg total) by mouth daily.  30 tablet  11  . nitroGLYCERIN (NITROSTAT) 0.4 MG SL tablet Place 0.4 mg under the tongue every 5 (five) minutes as needed. For chest pain. Do not exceed 3 tablets. If pain is not resolved after 3rd tablet call 911.      . omeprazole (PRILOSEC) 20 MG capsule  Take 1 capsule (20 mg total) by mouth daily.  90 capsule  3  . polyethylene glycol (MIRALAX / GLYCOLAX) packet Take 17 g by mouth daily.      [name] senna (SENOKOT) 8.6 MG TABS Take 1 tablet by mouth.      . zolpidem (AMBIEN) 5 MG tablet Take 5 mg by mouth at bedtime as needed. For sleep.      . furosemide (LASIX) 40 MG tablet Take 1 tablet (40 mg total) by mouth 2 (two) times daily.  30 tablet  0  . metoCLOPramide (REGLAN) 5 MG tablet Take 1 tablet (5 mg total) by mouth 4 (four) times daily as needed.       No current facility-administered medications on file prior to visit.   Review of Systems Constitutional: Negative for diaphoresis and unexpected weight change.  HENT: Negative for drooling and tinnitus.   Eyes: Negative for photophobia and visual disturbance.  Respiratory: Negative for choking and stridor.   Gastrointestinal: Negative for vomiting and blood in stool.  Genitourinary: Negative for hematuria and decreased urine volume.  Skin: Negative for color change and wound.  Neurological: Negative for tremors and numbness.     Objective:   Physical Exam BP 128/68  Pulse 83  Temp(Src) 98.5 F (36.9 C) (Oral)  Ht 5\' 6"  (1.676 m)  Wt 287 lb (130.182 kg)  BMI 46.32 kg/m2  SpO2 97% Physical Exam  VS noted Constitutional: Pt appears well-developed and well-nourished.  HENT: Head: Normocephalic.  Right Ear: External ear normal.  Left Ear: External ear normal.  Eyes: Conjunctivae and EOM are normal. Pupils are equal, round, and reactive to light.  Neck: Normal range of motion. Neck supple.  Cardiovascular: Normal rate and regular rhythm.   Pulmonary/Chest: Effort normal and breath sounds normal.  Abd:  Soft, NT, non-distended, + BS Neurological: Pt is alert. Not confused.  Walks with walker Skin: Skin is warm. No erythema.  Psychiatric: Pt behavior is normal. Thought content normal. Not depressed affect    Assessment & Plan:

## 2012-03-23 NOTE — Assessment & Plan Note (Signed)
stable overall by hx and exam, most recent data reviewed with pt, and pt to continue medical treatment as before BP Readings from Last 3 Encounters:  03/22/12 123/46  03/19/12 128/68  01/21/12 98/58

## 2012-03-31 ENCOUNTER — Ambulatory Visit (INDEPENDENT_AMBULATORY_CARE_PROVIDER_SITE_OTHER): Payer: Medicare Other | Admitting: Internal Medicine

## 2012-03-31 ENCOUNTER — Other Ambulatory Visit: Payer: Self-pay | Admitting: Internal Medicine

## 2012-03-31 ENCOUNTER — Ambulatory Visit: Payer: Medicare Other | Admitting: Internal Medicine

## 2012-03-31 ENCOUNTER — Other Ambulatory Visit: Payer: Medicare Other

## 2012-03-31 ENCOUNTER — Other Ambulatory Visit (INDEPENDENT_AMBULATORY_CARE_PROVIDER_SITE_OTHER): Payer: Medicare Other

## 2012-03-31 ENCOUNTER — Encounter: Payer: Self-pay | Admitting: Internal Medicine

## 2012-03-31 VITALS — BP 100/58 | HR 93 | Temp 97.4°F | Ht 66.0 in | Wt 287.0 lb

## 2012-03-31 DIAGNOSIS — R5381 Other malaise: Secondary | ICD-10-CM

## 2012-03-31 DIAGNOSIS — R112 Nausea with vomiting, unspecified: Secondary | ICD-10-CM | POA: Insufficient documentation

## 2012-03-31 DIAGNOSIS — I10 Essential (primary) hypertension: Secondary | ICD-10-CM

## 2012-03-31 DIAGNOSIS — D649 Anemia, unspecified: Secondary | ICD-10-CM

## 2012-03-31 DIAGNOSIS — R5383 Other fatigue: Secondary | ICD-10-CM | POA: Insufficient documentation

## 2012-03-31 DIAGNOSIS — Z79899 Other long term (current) drug therapy: Secondary | ICD-10-CM

## 2012-03-31 DIAGNOSIS — R531 Weakness: Secondary | ICD-10-CM

## 2012-03-31 LAB — URINALYSIS, ROUTINE W REFLEX MICROSCOPIC
Ketones, ur: NEGATIVE
Specific Gravity, Urine: 1.02 (ref 1.000–1.030)
Total Protein, Urine: NEGATIVE
Urine Glucose: NEGATIVE
Urobilinogen, UA: 0.2 (ref 0.0–1.0)

## 2012-03-31 LAB — CBC WITH DIFFERENTIAL/PLATELET
Basophils Absolute: 0 10*3/uL (ref 0.0–0.1)
Hemoglobin: 9.1 g/dL — ABNORMAL LOW (ref 12.0–15.0)
Lymphocytes Relative: 11.3 % — ABNORMAL LOW (ref 12.0–46.0)
Monocytes Relative: 8.2 % (ref 3.0–12.0)
Neutrophils Relative %: 78 % — ABNORMAL HIGH (ref 43.0–77.0)
Platelets: 588 10*3/uL — ABNORMAL HIGH (ref 150.0–400.0)
RDW: 16.1 % — ABNORMAL HIGH (ref 11.5–14.6)

## 2012-03-31 LAB — VITAMIN B12: Vitamin B-12: 384 pg/mL (ref 211–911)

## 2012-03-31 MED ORDER — SUCRALFATE 1 GM/10ML PO SUSP: 1.0000 g | Freq: Four times a day (QID) | ORAL | Status: DC

## 2012-03-31 MED ORDER — CEPHALEXIN 500 MG PO CAPS: 500.0000 mg | ORAL_CAPSULE | Freq: Four times a day (QID) | ORAL | Status: AC

## 2012-03-31 NOTE — Progress Notes (Signed)
Subjective:    Patient ID: Christine [name], female    DOB: 1931/08/18, 78 y.o.   MRN: [id number]  HPI  Here to f/u, has low appeitie, nausea, gagging with some yellow mucous coming up but denies vomiting; dysphagia; does have anterior lower CP with rib pain bilat and sens of dyspnea with short quick gasps she says not really from anxiety; did see card last wk - Dr Allyson Sabal - doing ok from heart standpoint and f/u at 4 mo;  Feels warm but no fever in ER or today; has some constipation despite the stool softners . Feels weak all over, hard to stand and walk.   Sleeps a lot during the day, not so much at night.  No falls. Last PT ended a few months ago.  Denies worsening depressive symptoms, suicidal ideation, or panic.   Pt denies fever, wt loss, night sweats, loss of appetite, or other constitutional symptoms Past Medical History  Diagnosis Date  . GERD (gastroesophageal reflux disease)   . Hypertension   . Diabetes mellitus     diet controlled  . Morbid obesity   . Arthritis   . Chronic kidney disease     nephrolithiasis left kidney,s/p open resection  . Status post arthroscopic surgery of right knee   . Diverticulosis of sigmoid colon   . Chronic back pain   . Pneumonia 07/2011    hx  . Pulmonary embolism 07/2011    hx  . Uterine cancer     endometrial high grade adenocarcinoma  . Uterus cancer, sarcoma     high grade invasive carcinosarcoma  . Myocardial infarction 1989  . Bronchitis   . H/O cardiomegaly   . Lymphedema 09/29/2011  . Diabetes mellitus, diet controlled 10/03/2011  . Chronic kidney disease, stage II (mild) 09/20/2011  . Acute diastolic congestive heart failure, 2D Oct 2012 09/29/2011  . Presence of IVC filter 01/08/2012  . History of colon polyps 01/08/2012  . Chest pain 01/16/2012  . Aortic stenosis, moderate 01/16/2012  . PAF (paroxysmal atrial fibrillation), 01/18/12 01/18/2012  . CAD (coronary artery disease) 03/14/2012  . Nephrolithiasis 03/14/2012  . Diverticulosis  03/14/2012   Past Surgical History  Procedure Date  . Colonoscopy     negative  . Knee arthroscopy     right knee  . Cataract extraction, bilateral   . Tubal ligation 1973  . Abdominal hysterectomy 10/05/2008    total with b/l salpingo-oopherectomy  . Kidney stone removal   . Open left knee cartilage repair   . Left index finger surgery     reports that she has never smoked. She has never used smokeless tobacco. She reports that she does not drink alcohol or use illicit drugs. family history includes Breast cancer in her sister; Cervical cancer in her sister; and Colon cancer in her sister. No Known Allergies Current Outpatient Prescriptions on File Prior to Visit  Medication Sig Dispense Refill  . aspirin EC 81 MG EC tablet Take 1 tablet (81 mg total) by mouth daily.      . cholecalciferol (VITAMIN D) 1000 UNITS tablet Take 1,000 Units by mouth daily.      [name] docusate sodium (COLACE) 100 MG capsule Take 200 mg by mouth 2 (two) times daily. For diarrhea      . dronedarone (MULTAQ) 400 MG tablet Take 1 tablet (400 mg total) by mouth 2 (two) times daily with a meal.  60 tablet  11  . ezetimibe (ZETIA) 10 MG tablet Take 10 mg by  mouth daily.       . furosemide (LASIX) 40 MG tablet Take 1 tablet (40 mg total) by mouth 2 (two) times daily.  30 tablet  0  . HYDROcodone-acetaminophen (NORCO) 10-325 MG per tablet Take 1 tablet by mouth every 6 (six) hours as needed. For pain.  60 tablet  1  . metoprolol succinate (TOPROL-XL) 25 MG 24 hr tablet Take 1 tablet (25 mg total) by mouth daily.  30 tablet  11  . nitroGLYCERIN (NITROSTAT) 0.4 MG SL tablet Place 0.4 mg under the tongue every 5 (five) minutes as needed. For chest pain. Do not exceed 3 tablets. If pain is not resolved after 3rd tablet call 911.      . omeprazole (PRILOSEC) 20 MG capsule Take 1 capsule (20 mg total) by mouth daily.  90 capsule  3  . polyethylene glycol (MIRALAX / GLYCOLAX) packet Take 17 g by mouth daily.      [name] senna  (SENOKOT) 8.6 MG TABS Take 1 tablet by mouth.      . zolpidem (AMBIEN) 5 MG tablet Take 5 mg by mouth at bedtime as needed. For sleep.      . metoCLOPramide (REGLAN) 5 MG tablet Take 1 tablet (5 mg total) by mouth 4 (four) times daily as needed.      . sucralfate (CARAFATE) 1 GM/10ML suspension Take 10 mLs (1 g total) by mouth 4 (four) times daily.  420 mL  0   Review of Systems Review of Systems  Constitutional: Negative for diaphoresis and unexpected weight change.  HENT: Negative for tinnitus.   Eyes: Negative for photophobia and visual disturbance.  Respiratory: Negative for choking and stridor.   Gastrointestinal: Negative for vomiting and blood in stool.  Genitourinary: Negative for hematuria and decreased urine volume.  Musculoskeletal: Negative for gait problem.  Skin: Negative for color change and wound.    Objective:   Physical Exam BP 100/58  Pulse 93  Temp 97.4 F (36.3 C) (Oral)  Ht 5\' 6"  (1.676 m)  Wt 287 lb (130.182 kg)  BMI 46.32 kg/m2  SpO2 95% Physical Exam  VS noted, not ill appearing Constitutional: Pt appears well-developed and well-nourished.  HENT: Head: Normocephalic.  Right Ear: External ear normal.  Left Ear: External ear normal.  Eyes: Conjunctivae and EOM are normal. Pupils are equal, round, and reactive to light.  Neck: Normal range of motion. Neck supple.  Cardiovascular: Normal rate and regular rhythm.   Pulmonary/Chest: Effort normal and breath sounds normal.  Abd:  Soft, NT, non-distended, + BS Neurological: Pt is alert. Not confused, motor/gait intact  Skin: Skin is warm. No erythema.  Psychiatric: Pt behavior is normal. Thought content normal.     Assessment & Plan:

## 2012-03-31 NOTE — Assessment & Plan Note (Signed)
?   Constipation related vs other, cont PPI and anti-emetic, also for carafate asd, refer GI - ? Need EGD

## 2012-03-31 NOTE — Assessment & Plan Note (Addendum)
Exam unrevealing but has gait problem, higher risk fall - for [facility] to eval, Home PT, and also refer to [facility] for any other needs   Note: total time for pt hx, exam, review of record with pt in room, determination of diagnoses and plan for further eval and tx is > 40 min

## 2012-03-31 NOTE — Patient Instructions (Addendum)
Take all new medications as prescribed - the carafate to coat the stomach Continue all other medications as before Please go to LAB in the Basement for the blood and/or urine tests to be done today You will be contacted by phone if any changes need to be made immediately.  Otherwise, you will receive a letter about your results with an explanation. You will be contacted regarding the referral for: [facility], [facility], and [facility] You will be contacted regarding the referral for: GI Please return in 1 month

## 2012-04-06 ENCOUNTER — Encounter: Payer: Self-pay | Admitting: Internal Medicine

## 2012-04-06 NOTE — Assessment & Plan Note (Signed)
stable overall by hx and exam, most recent data reviewed with pt, and pt to continue medical treatment as before BP Readings from Last 3 Encounters:  03/31/12 100/58  03/22/12 123/46  03/19/12 128/68

## 2012-04-06 NOTE — Assessment & Plan Note (Signed)
For f/u lab, ? Related to fatigue,  to f/u any worsening symptoms or concerns

## 2012-04-09 ENCOUNTER — Ambulatory Visit (INDEPENDENT_AMBULATORY_CARE_PROVIDER_SITE_OTHER): Payer: Medicare Other | Admitting: Internal Medicine

## 2012-04-09 DIAGNOSIS — R0989 Other specified symptoms and signs involving the circulatory and respiratory systems: Secondary | ICD-10-CM

## 2012-04-09 LAB — PULMONARY FUNCTION TEST

## 2012-04-09 NOTE — Progress Notes (Signed)
PFT done today.  Pt unable to perform lung volumes.

## 2012-04-13 ENCOUNTER — Other Ambulatory Visit: Payer: Self-pay | Admitting: Internal Medicine

## 2012-04-14 IMAGING — CR DG RIBS BILAT 3V
4 series · 4 of 4 positions shown · non-contrast
Comparison: 09/29/2011.

CLINICAL DATA: History of pain in the rib area.  No history injury.
Pain in the anterior rib area bilaterally.

BILATERAL RIBS - 3+ VIEW

[w ribs ap/pa upper left * (1 of 2)]
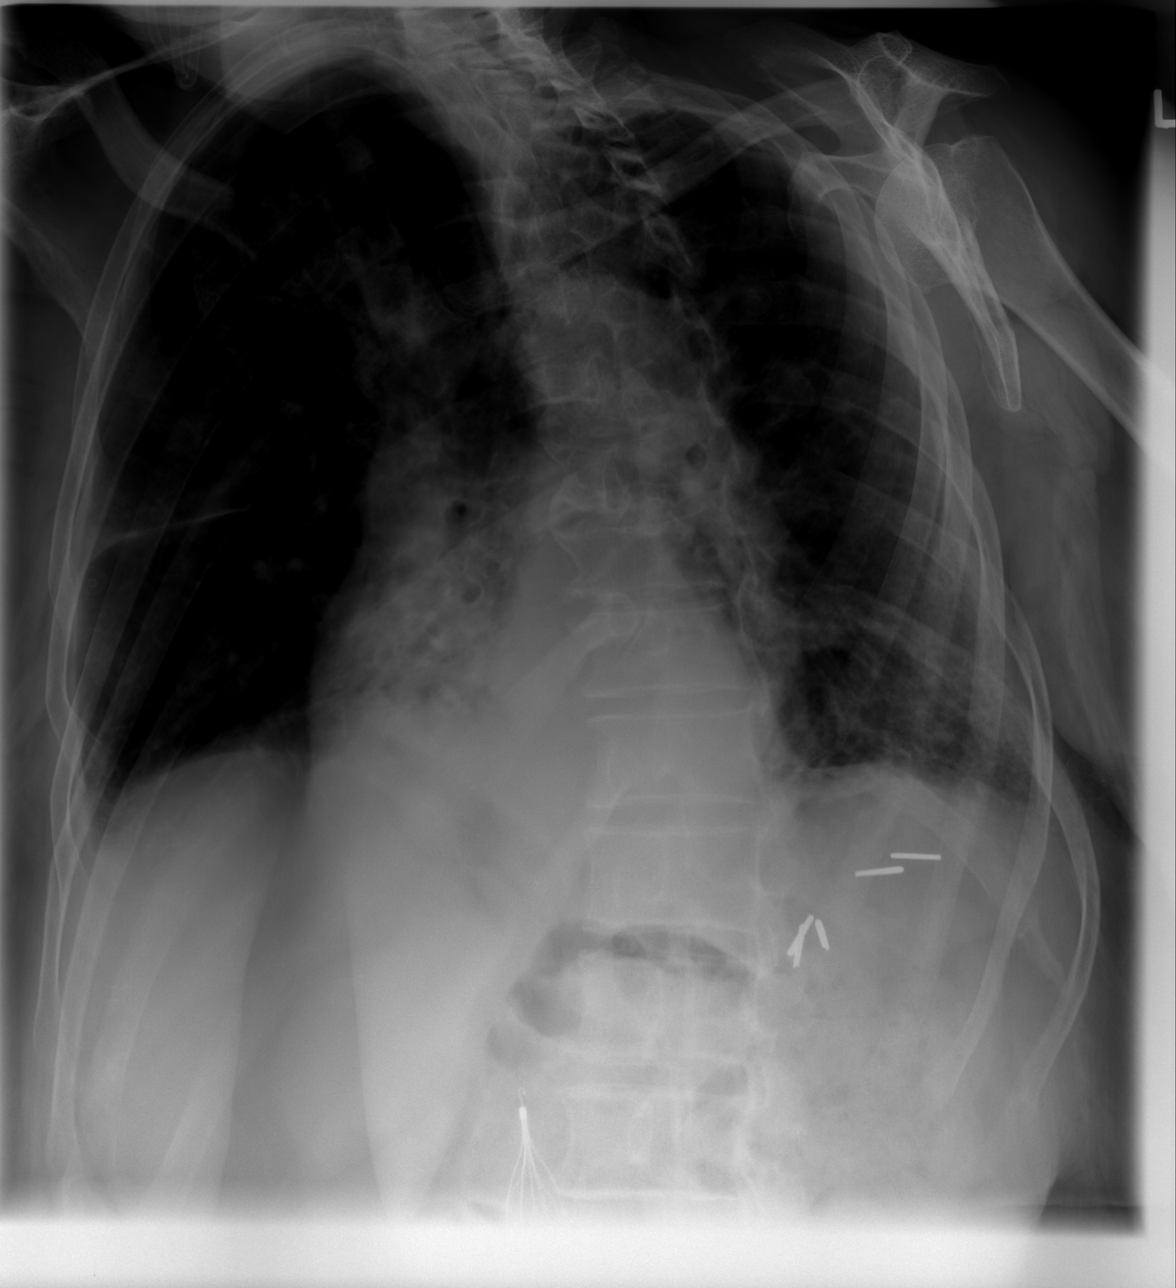

[w ribs ap/pa upper left * (2 of 2)]
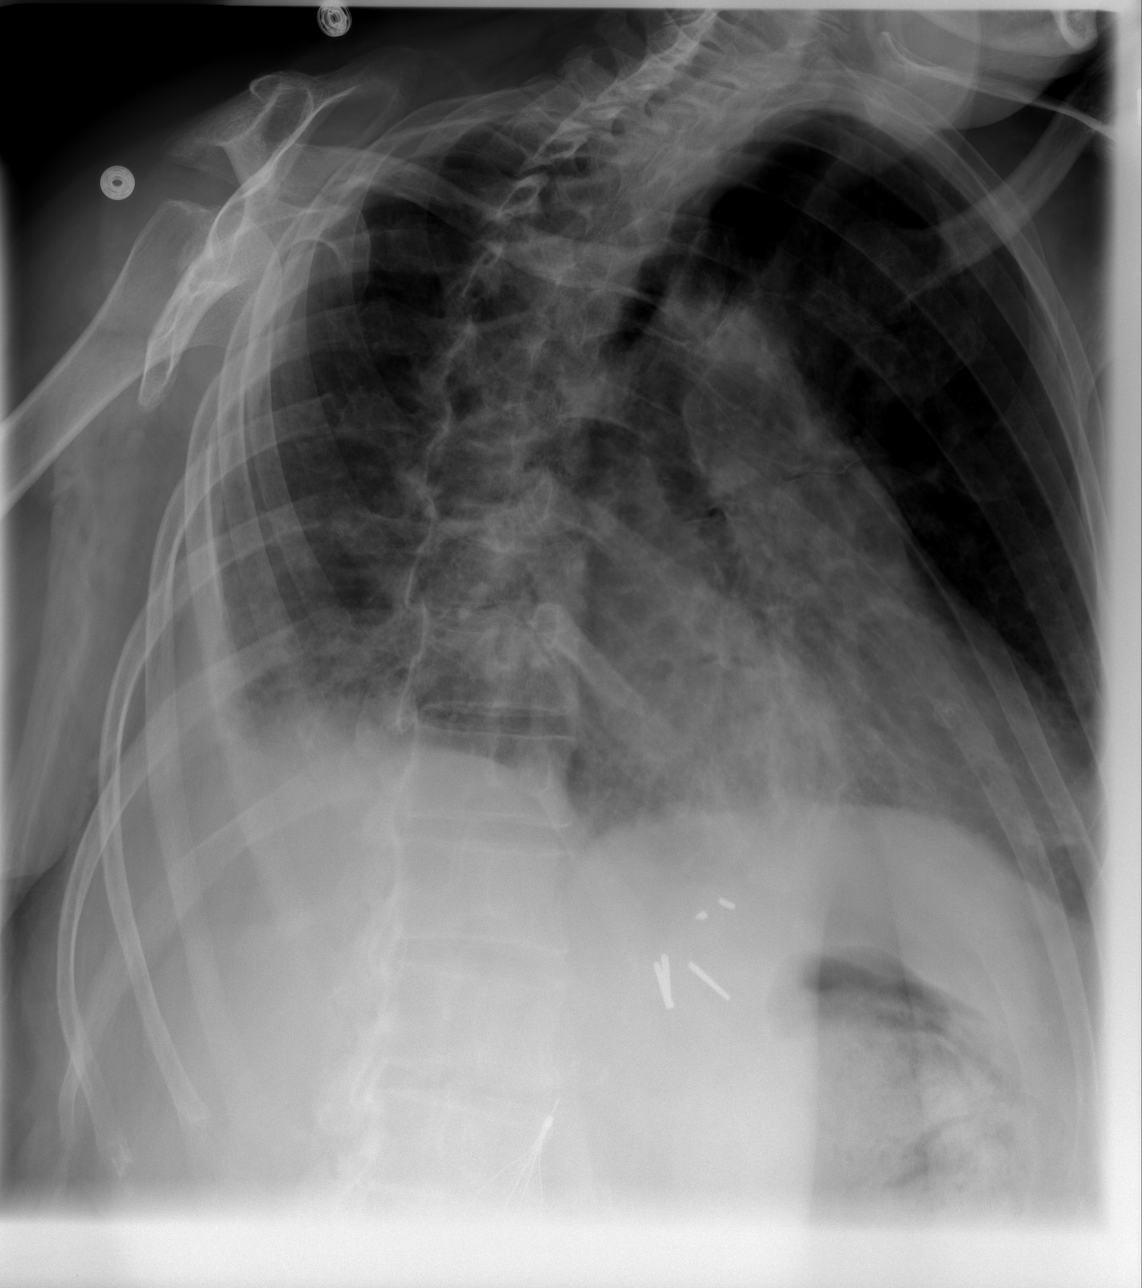

[w ribs ap/pa upper right *]
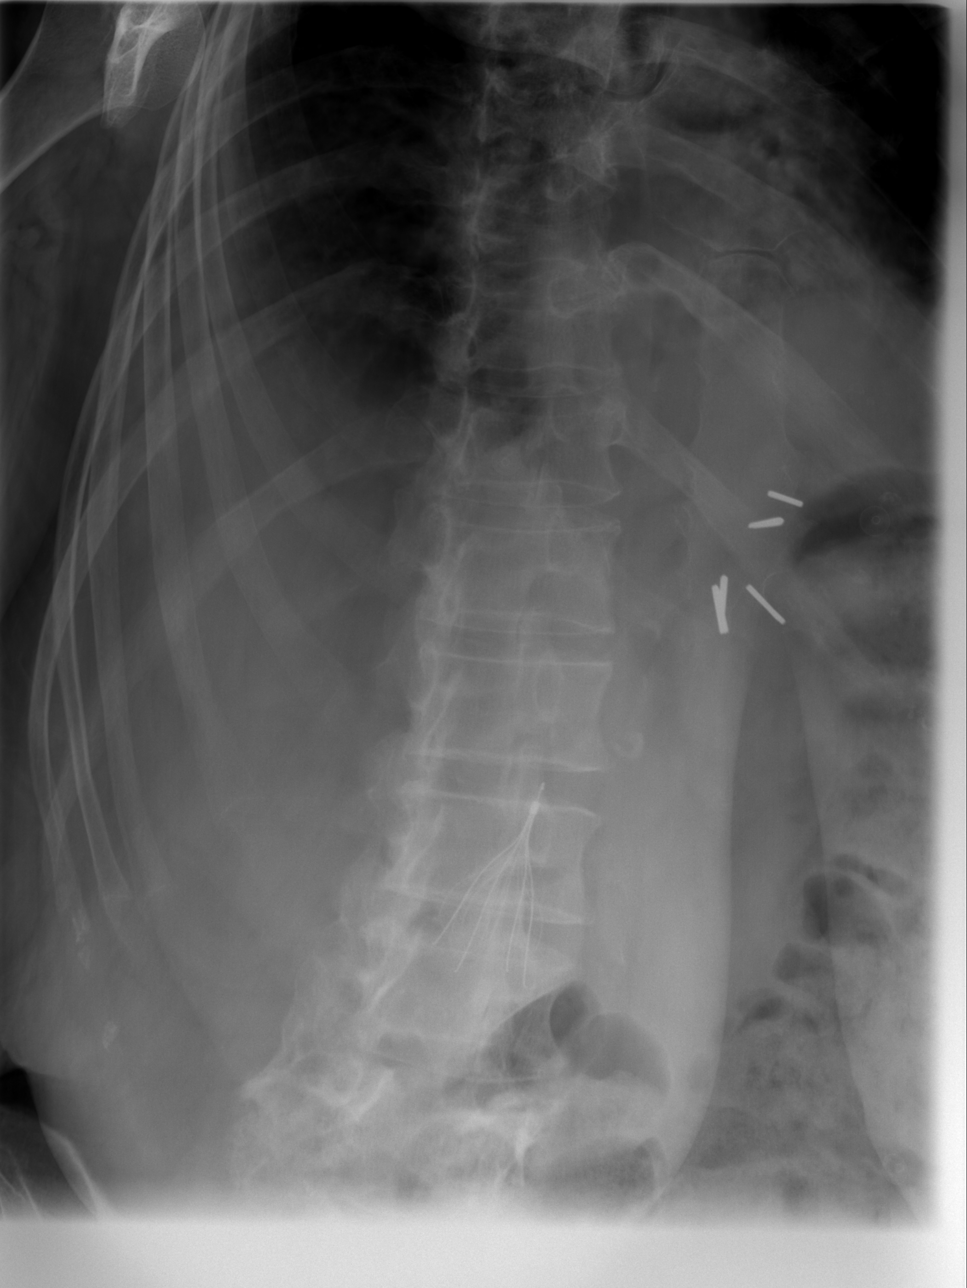

[w ribs ap/pa lower left *]
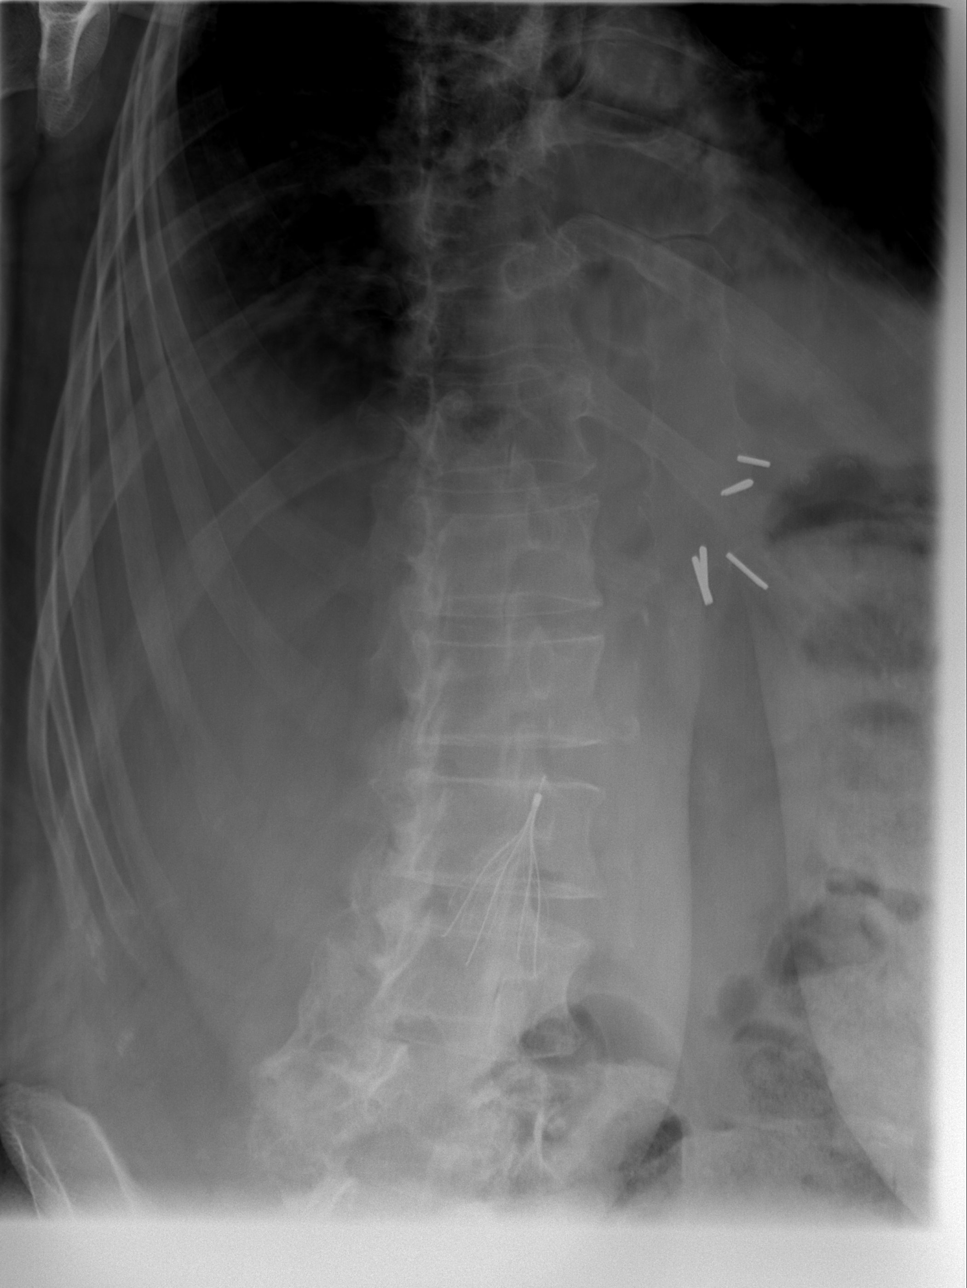

[4 of 4 positions shown; findings below may reference images not displayed]

FINDINGS: There is moderate enlargement of the cardiac silhouette.
Ectasia and nonaneurysmal calcification of the thoracic aorta are
seen.
There is mild vascular congestion pattern.  There is moderate sized
right pleural effusion with associated right basilar atelectasis.
No pneumothorax is seen.  There is osteopenic appearance of the
bones.  No rib fracture or rib destruction is evident.  Surgical
staples are seen in the upper abdomen.  Inferior vena cava
Klever filter is in place.
IMPRESSION: Moderate cardiac silhouette enlargement with vascular congestion
pattern.  Moderate sized right pleural effusion with associated
right basilar atelectasis.  This most likely reflects element of
volume overload and / or congestive heart failure.  No pneumothorax
is seen.  No rib fracture or destruction is evident.  There is
osteopenic appearance of bones.

## 2012-04-23 ENCOUNTER — Encounter: Payer: Self-pay | Admitting: Internal Medicine

## 2012-04-23 ENCOUNTER — Ambulatory Visit (INDEPENDENT_AMBULATORY_CARE_PROVIDER_SITE_OTHER): Payer: Medicare Other | Admitting: Internal Medicine

## 2012-04-23 VITALS — BP 100/60 | HR 88

## 2012-04-23 DIAGNOSIS — K5900 Constipation, unspecified: Secondary | ICD-10-CM

## 2012-04-23 DIAGNOSIS — R630 Anorexia: Secondary | ICD-10-CM

## 2012-04-23 DIAGNOSIS — R110 Nausea: Secondary | ICD-10-CM

## 2012-04-23 NOTE — Patient Instructions (Addendum)
Dr Stan Head recommends that you complete a bowel purge (to clean out your bowels). Please do the following: Purchase a bottle of Miralax over the counter and use the senokot you have at home. Take 4 senokot tablets. Wait 1 hour. You will then drink 6 capfuls of Miralax mixed in an adequate amount of water/juice/gatorade (you may choose which of these liquids to drink) over the next 2-3 hours. You should expect results within 1 to 6 hours after completing the bowel purge.  If you experience vomiting stop the purge.  Call us tomorrow with an update on your condition.   Please stop your Carafate.   Thank you for choosing [facility] for your healthcare needs today.

## 2012-04-23 NOTE — Progress Notes (Signed)
  Subjective:    Patient ID: SHANEL PRAZAK, female    DOB: 11/01/1930, 78 y.o.   MRN: [id number]  HPI Elderly AA woman here with cousin. Patient in wheelchair and unable to get onto exam table. She has chronic back pain and is taking narcotics. For several weeks has had increasing constipation in setting of chronic constipation with minimal defecation. Also with increasing nausea and gagging ut no vomiting. Appetitie is reduced. Saw PCP and carafate added recently. Has not helped. Also with lower abdominal pain that is helped when she can defecate. Has used Senokot (helps - small stool), MiraLax and stool softener.  Has severe CHF and dyspnea.   Medications, allergies, past medical history, past surgical history, family history and social history are reviewed and updated in the EMR.  Review of Systems As per HPI, all other ROS negative    Objective:   Physical Exam General:  Chronically ill in wheelchair Eyes:  Anicteric, bilateral arcus ENT:   Upper denture and 2 poor lower teeth, no other lesions  Neck:   supple w/o thyromegaly or mass.  Lungs: Clear to auscultation bilaterally. Heart:  S4S1S2, 2/6 HSM murmur Abdomen:  soft, non-tender, no hepatosplenomegaly, hernia, or mass and BS+.  Rectal: Unable to complete as she cannot stand and too heavy to lift Lymph:  no cervical or supraclavicular adenopathy. Extremities:   Massive lower extremity edema Skin   no rash. Neuro:  A&O x 3.  Psych:  appropriate mood and  Affect.   Data Reviewed: KUB 4/13 viewed  - non-obstructive pattern   Lab Results  Component Value Date   WBC 10.5 03/31/2012   HGB 9.1* 03/31/2012   HCT 28.5* 03/31/2012   MCV 88.3 03/31/2012   PLT 588.0* 03/31/2012     Chemistry      Component Value Date/Time   NA 132* 03/22/2012 1404   K 3.7 03/22/2012 1404   CL 95* 03/22/2012 1404   CO2 25 03/22/2012 1404   BUN 22 03/22/2012 1404   CREATININE 1.75* 03/22/2012 1404      Component Value Date/Time   CALCIUM 9.0 03/22/2012  1404   ALKPHOS 59 03/22/2012 1404   AST 19 03/22/2012 1404   ALT 8 03/22/2012 1404   BILITOT 0.2* 03/22/2012 1404          Assessment & Plan:   1. Constipation   2. Nausea   3. Anorexia    Multifactorial issues in setting of severe co-morbidities (CHF, especially), and narcotic use for chronic back pain.  I think constipation is cause of multiple problems including nausea. Though, CHF may be contributing also.  1. Stop carafate (it contributes to constipation 2. MiraLax purge 3. Call back tomorrow with results - purge helps - regular MiraLax and close follow-up 1-2 weeks to reassess  HY:QMVHQ Jonny Ruiz, MD

## 2012-04-24 ENCOUNTER — Telehealth: Payer: Self-pay | Admitting: Internal Medicine

## 2012-04-24 NOTE — Telephone Encounter (Signed)
Patient reports that she is having minimal amount of nausea and gagging.  She reports that she feels better.  She did report a 7 lb weight loss since yesterday after doing bowel purge.

## 2012-04-24 NOTE — Telephone Encounter (Signed)
Find out about the nausea and gagging -   i know she has back pain and not surprised that is still there  Does she have abdominal pain?

## 2012-04-24 NOTE — Telephone Encounter (Signed)
Patient is aware.  She needs an appt on a Wed only.   The next Wed Dr. Leone Payor is in the office is 05/14/12 she will come at 10:15

## 2012-04-24 NOTE — Telephone Encounter (Signed)
Use MiraLax bid See me again 1-2 weeks

## 2012-04-25 ENCOUNTER — Other Ambulatory Visit: Payer: Self-pay

## 2012-04-25 MED ORDER — FUROSEMIDE 40 MG PO TABS: 40.0000 mg | ORAL_TABLET | Freq: Two times a day (BID) | ORAL | Status: DC

## 2012-04-28 DIAGNOSIS — I5031 Acute diastolic (congestive) heart failure: Secondary | ICD-10-CM

## 2012-04-28 DIAGNOSIS — IMO0001 Reserved for inherently not codable concepts without codable children: Secondary | ICD-10-CM

## 2012-04-28 DIAGNOSIS — I509 Heart failure, unspecified: Secondary | ICD-10-CM

## 2012-04-28 DIAGNOSIS — R269 Unspecified abnormalities of gait and mobility: Secondary | ICD-10-CM

## 2012-04-30 ENCOUNTER — Encounter: Payer: Self-pay | Admitting: Internal Medicine

## 2012-04-30 ENCOUNTER — Ambulatory Visit (INDEPENDENT_AMBULATORY_CARE_PROVIDER_SITE_OTHER): Payer: Medicare Other | Admitting: Internal Medicine

## 2012-04-30 VITALS — BP 102/70 | HR 91 | Temp 98.5°F | Ht 69.0 in | Wt 264.2 lb

## 2012-04-30 DIAGNOSIS — J309 Allergic rhinitis, unspecified: Secondary | ICD-10-CM

## 2012-04-30 DIAGNOSIS — D649 Anemia, unspecified: Secondary | ICD-10-CM

## 2012-04-30 DIAGNOSIS — I10 Essential (primary) hypertension: Secondary | ICD-10-CM

## 2012-04-30 DIAGNOSIS — IMO0001 Reserved for inherently not codable concepts without codable children: Secondary | ICD-10-CM

## 2012-04-30 MED ORDER — FEXOFENADINE HCL 180 MG PO TABS: 180.0000 mg | ORAL_TABLET | Freq: Every day | ORAL | Status: DC

## 2012-04-30 NOTE — Progress Notes (Signed)
Subjective:    Patient ID: Christine [name], female    DOB: 1931/02/09, 78 y.o.   MRN: [id number]  HPI  Here to f/ul overall not happy with ongoing constipation, nausea and decreased appetite, wt now at 91 lbs, has seen GI with some improvement, has f/u July 30.  Mentions bilat CTS symptoms s/p steroid injections to wrists somewhat improved only, plans to see her hand surgeon as well.  Mentions intermittent mild upper bilat chest soreness, sharp fleeting pains pleuritic, not assoc with radiation, sob, n/v, diaphoresis, exercise, palp's or syncope.  Does have several wks ongoing nasal allergy symptoms with yellowish congestion that she will occas cough up, and itch and sneeze, without fever, pain, ST, wheezing.  No overt bleeding or bruising.   Pt denies fever, night sweats,  or other constitutional symptoms. Past Medical History  Diagnosis Date  . GERD (gastroesophageal reflux disease)   . Hypertension   . Morbid obesity   . Arthritis   . Chronic kidney disease     nephrolithiasis left kidney,s/p open resection  . Status post arthroscopic surgery of right knee   . Chronic back pain   . Pneumonia 07/2011    hx  . Pulmonary embolism 07/2011    hx  . Uterine cancer     endometrial high grade adenocarcinoma  . Uterus cancer, sarcoma     high grade invasive carcinosarcoma  . Myocardial infarction 1989  . Bronchitis   . H/O cardiomegaly   . Lymphedema 09/29/2011  . Diabetes mellitus, diet controlled 10/03/2011  . Chronic kidney disease, stage II (mild) 09/20/2011  . Acute diastolic congestive heart failure, 2D Oct 2012 09/29/2011  . Presence of IVC filter 01/08/2012  . History of colon polyps 01/08/2012  . Chest pain 01/16/2012  . Aortic stenosis, moderate 01/16/2012  . PAF (paroxysmal atrial fibrillation), 01/18/12 01/18/2012  . CAD (coronary artery disease) 03/14/2012  . Nephrolithiasis 03/14/2012  . Diverticulosis 03/14/2012   Past Surgical History  Procedure Date  . Colonoscopy     multiple    . Knee arthroscopy     right knee  . Cataract extraction, bilateral   . Tubal ligation 1973  . Abdominal hysterectomy 10/05/2008    total with b/l salpingo-oopherectomy  . Kidney stone removal   . Open left knee cartilage repair   . Left index finger surgery     reports that she has never smoked. She has never used smokeless tobacco. She reports that she does not drink alcohol or use illicit drugs. family history includes Breast cancer in her sister; Cervical cancer in her sister; and Colon cancer in her sister. No Known Allergies Current Outpatient Prescriptions on File Prior to Visit  Medication Sig Dispense Refill  . aspirin EC 81 MG EC tablet Take 1 tablet (81 mg total) by mouth daily.      . cholecalciferol (VITAMIN D) 1000 UNITS tablet Take 1,000 Units by mouth daily.      [name] docusate sodium (COLACE) 100 MG capsule Take 200 mg by mouth 2 (two) times daily. For diarrhea      . dronedarone (MULTAQ) 400 MG tablet Take 1 tablet (400 mg total) by mouth 2 (two) times daily with a meal.  60 tablet  11  . ezetimibe (ZETIA) 10 MG tablet Take 10 mg by mouth daily.       . furosemide (LASIX) 40 MG tablet Take 1 tablet (40 mg total) by mouth 2 (two) times daily.  60 tablet  2  . HYDROcodone-acetaminophen (  NORCO) 10-325 MG per tablet Take 1 tablet by mouth every 6 (six) hours as needed. For pain.  60 tablet  1  . metoprolol succinate (TOPROL-XL) 25 MG 24 hr tablet Take 1 tablet (25 mg total) by mouth daily.  30 tablet  11  . nitroGLYCERIN (NITROSTAT) 0.4 MG SL tablet Place 0.4 mg under the tongue every 5 (five) minutes as needed. For chest pain. Do not exceed 3 tablets. If pain is not resolved after 3rd tablet call 911.      . Nutritional Supplements (ENSURE PO) Take by mouth daily.      [name] omeprazole (PRILOSEC) 20 MG capsule Take 1 capsule (20 mg total) by mouth daily.  90 capsule  3  . polyethylene glycol (MIRALAX / GLYCOLAX) packet Take 17 g by mouth daily.      [name] senna (SENOKOT) 8.6 MG TABS  Take 1 tablet by mouth.      . zolpidem (AMBIEN) 5 MG tablet Take 5 mg by mouth at bedtime as needed. For sleep.      . fexofenadine (ALLEGRA) 180 MG tablet Take 1 tablet (180 mg total) by mouth daily. As needed  30 tablet  11  . ondansetron (ZOFRAN) 4 MG tablet Uses as directed       Review of Systems Review of Systems  Constitutional: Negative for diaphoresis HENT: Negative for drooling and tinnitus.   Eyes: Negative for photophobia and visual disturbance.  Respiratory: Negative for choking and stridor.   Gastrointestinal: Negative for vomiting and blood in stool.  Genitourinary: Negative for hematuria and decreased urine volume.  Musculoskeletal: Negative for gait problem.  Skin: Negative for color change and wound.  Neurological: Negative for tremors and numbness.  Psychiatric/Behavioral: Negative for decreased concentration. The patient is not hyperactive.      Objective:   Physical Exam BP 102/70  Pulse 91  Temp 98.5 F (36.9 C) (Oral)  Ht 5\' 9"  (1.753 m)  Wt 298 lb (135.172 kg)  BMI 44.01 kg/m2  SpO2 92% Physical Exam  VS noted Constitutional: Pt appears well-developed and well-nourished.  HENT: Head: Normocephalic.  Right Ear: External ear normal.  Left Ear: External ear normal.  Bilat tm's mild erythema.  Sinus nontender.  Pharynx mild erythema Eyes: Conjunctivae and EOM are normal. Pupils are equal, round, and reactive to light.  Neck: Normal range of motion. Neck supple.  Cardiovascular: Normal rate and regular rhythm.   Pulmonary/Chest: Effort normal and breath sounds normal.  Abd:  Soft, NT, non-distended, + BS - benign Neurological: Pt is alert. Not confused.  Skin: Skin is warm. No erythema. No ulcers or wounds Psychiatric: Pt behavior is normal. Mild nervous.     Assessment & Plan:

## 2012-04-30 NOTE — Patient Instructions (Addendum)
Take all new medications as prescribed - the generic allegra for allergies and congestion Continue all other medications as before Please have the pharmacy call with any refills you may need. You will be contacted regarding the referral for: hematology (for anemia) Please keep your appointments with your specialists as you have planned  - Dr Leone Payor, and your hand surgeon

## 2012-05-02 ENCOUNTER — Telehealth: Payer: Self-pay | Admitting: Hematology and Oncology

## 2012-05-02 NOTE — Telephone Encounter (Signed)
lmonvm advising the pt of a new pt consult with dr odogwu and for her to call me back to confirm an appt

## 2012-05-04 ENCOUNTER — Encounter: Payer: Self-pay | Admitting: Internal Medicine

## 2012-05-04 DIAGNOSIS — J309 Allergic rhinitis, unspecified: Secondary | ICD-10-CM | POA: Insufficient documentation

## 2012-05-04 HISTORY — DX: Allergic rhinitis, unspecified: J30.9

## 2012-05-04 NOTE — Assessment & Plan Note (Signed)
stable overall by hx and exam, most recent data reviewed with pt, and pt to continue medical treatment as before BP Readings from Last 3 Encounters:  04/30/12 102/70  04/23/12 100/60  03/31/12 100/58

## 2012-05-04 NOTE — Assessment & Plan Note (Signed)
Encouraged to try otc allegra prn,  to f/u any worsening symptoms or concerns

## 2012-05-04 NOTE — Assessment & Plan Note (Addendum)
Persistent, etiology not clear, for hematology referrral  Lab Results  Component Value Date   WBC 10.5 03/31/2012   HGB 9.1* 03/31/2012   HCT 28.5* 03/31/2012   MCV 88.3 03/31/2012   PLT 588.0* 03/31/2012

## 2012-05-04 NOTE — Assessment & Plan Note (Signed)
stable overall by hx and exam, most recent data reviewed with pt, and pt to continue medical treatment as before Lab Results  Component Value Date   HGBA1C 5.8* 01/17/2012

## 2012-05-14 ENCOUNTER — Ambulatory Visit: Payer: Medicare Other | Admitting: Internal Medicine

## 2012-05-15 ENCOUNTER — Telehealth: Payer: Self-pay | Admitting: Internal Medicine

## 2012-05-15 NOTE — Telephone Encounter (Signed)
[facility] calling about PT visits. Pt not feeling well 05/15/12, Home health RN had assessed pt and states had fluid on her lungs and respirations were labored. Pt declined PT 05/15/12. O2 Sats dipping into 80's when up and moving. RN should have contacted office about client condition and plan. [facility] wanting Dr Raphael Gibney aware that pt will not be getting 2 PT visits/week fulfilled at this time, due to pt declination of visits. MKing RN/CAN

## 2012-05-16 NOTE — Telephone Encounter (Signed)
Noted - appreciate update Will also leave for [facility]

## 2012-05-17 NOTE — Telephone Encounter (Signed)
Robin to contact pt or family - does she have appt with [facility] soon? If not should consider OV asap with me

## 2012-05-19 ENCOUNTER — Telehealth: Payer: Self-pay | Admitting: Hematology and Oncology

## 2012-05-19 ENCOUNTER — Other Ambulatory Visit: Payer: Self-pay

## 2012-05-19 ENCOUNTER — Ambulatory Visit: Payer: Medicare Other | Admitting: Radiation Oncology

## 2012-05-19 ENCOUNTER — Telehealth: Payer: Self-pay | Admitting: Internal Medicine

## 2012-05-19 MED ORDER — ALBUTEROL SULFATE HFA 108 (90 BASE) MCG/ACT IN AERS: 2.0000 | INHALATION_SPRAY | Freq: Four times a day (QID) | RESPIRATORY_TRACT | Status: DC | PRN

## 2012-05-19 NOTE — Telephone Encounter (Signed)
A user error has taken place: encounter opened in error, closed for administrative reasons.

## 2012-05-19 NOTE — Addendum Note (Signed)
Addended by: Corwin Levins on: 05/19/2012 05:44 PM   Modules accepted: Orders

## 2012-05-19 NOTE — Telephone Encounter (Signed)
Called the patient and she schedule appt. With Dr. Jonny Ruiz per instructions.

## 2012-05-19 NOTE — Telephone Encounter (Signed)
Ok for albuterol HFA - done erx  I would try to continue current pain med, as she is on high strength hydrocodone, and stronger pain meds will make her constipation worse

## 2012-05-19 NOTE — Telephone Encounter (Signed)
S/w the pt's son Leonette Most  And he wanted to cancel the aug appts with dr Dalene Carrow

## 2012-05-19 NOTE — Telephone Encounter (Signed)
Caller: Shirley/Other; Phone Number: [phone]; Message from caller: Is calling to get refill on albuterol inhaler and says she needs some strong pain meds.

## 2012-05-20 NOTE — Telephone Encounter (Signed)
Called the patient informed inhaler sent to pharmacy and informed of MD instructions on pain medication.

## 2012-05-21 ENCOUNTER — Ambulatory Visit: Payer: Medicare Other

## 2012-05-21 ENCOUNTER — Ambulatory Visit (INDEPENDENT_AMBULATORY_CARE_PROVIDER_SITE_OTHER): Payer: Medicare Other | Admitting: Internal Medicine

## 2012-05-21 ENCOUNTER — Ambulatory Visit (INDEPENDENT_AMBULATORY_CARE_PROVIDER_SITE_OTHER)
Admission: RE | Admit: 2012-05-21 | Discharge: 2012-05-21 | Disposition: A | Discharge status: Home / Self Care | Payer: Medicare Other | Source: Ambulatory Visit | Attending: Internal Medicine | Admitting: Internal Medicine

## 2012-05-21 ENCOUNTER — Ambulatory Visit: Payer: Medicare Other | Admitting: Hematology and Oncology

## 2012-05-21 ENCOUNTER — Encounter: Payer: Self-pay | Admitting: Internal Medicine

## 2012-05-21 ENCOUNTER — Ambulatory Visit: Payer: Medicare Other | Admitting: Internal Medicine

## 2012-05-21 ENCOUNTER — Other Ambulatory Visit (INDEPENDENT_AMBULATORY_CARE_PROVIDER_SITE_OTHER): Payer: Medicare Other

## 2012-05-21 VITALS — BP 102/62 | HR 87 | Temp 97.8°F | Ht 66.0 in | Wt 258.0 lb

## 2012-05-21 DIAGNOSIS — I509 Heart failure, unspecified: Secondary | ICD-10-CM

## 2012-05-21 DIAGNOSIS — R5381 Other malaise: Secondary | ICD-10-CM

## 2012-05-21 DIAGNOSIS — D649 Anemia, unspecified: Secondary | ICD-10-CM

## 2012-05-21 DIAGNOSIS — R531 Weakness: Secondary | ICD-10-CM

## 2012-05-21 DIAGNOSIS — R634 Abnormal weight loss: Secondary | ICD-10-CM

## 2012-05-21 DIAGNOSIS — C55 Malignant neoplasm of uterus, part unspecified: Secondary | ICD-10-CM

## 2012-05-21 LAB — BASIC METABOLIC PANEL
Calcium: 8.8 mg/dL (ref 8.4–10.5)
GFR: 28.92 mL/min — ABNORMAL LOW (ref >60.00)
Glucose, Bld: 88 mg/dL (ref 70–99)
Potassium: 3.6 mEq/L (ref 3.5–5.1)
Sodium: 136 mEq/L (ref 135–145)

## 2012-05-21 LAB — CBC WITH DIFFERENTIAL/PLATELET
Basophils Absolute: 0.1 10*3/uL (ref 0.0–0.1)
Eosinophils Absolute: 0.3 10*3/uL (ref 0.0–0.7)
HCT: 26.7 % — ABNORMAL LOW (ref 36.0–46.0)
Hemoglobin: 8.6 g/dL — ABNORMAL LOW (ref 12.0–15.0)
Lymphs Abs: 1.7 10*3/uL (ref 0.7–4.0)
MCHC: 32.1 g/dL (ref 30.0–36.0)
MCV: 89.8 fl (ref 78.0–100.0)
Monocytes Absolute: 1.3 10*3/uL — ABNORMAL HIGH (ref 0.1–1.0)
Neutro Abs: 11.2 10*3/uL — ABNORMAL HIGH (ref 1.4–7.7)
Platelets: 603 10*3/uL — ABNORMAL HIGH (ref 150.0–400.0)
RDW: 17.5 % — ABNORMAL HIGH (ref 11.5–14.6)

## 2012-05-21 LAB — HEPATIC FUNCTION PANEL
AST: 25 U/L (ref 0–37)
Alkaline Phosphatase: 56 U/L (ref 39–117)
Bilirubin, Direct: 0.1 mg/dL (ref 0.0–0.3)

## 2012-05-21 MED ORDER — FUROSEMIDE 80 MG PO TABS: 80.0000 mg | ORAL_TABLET | Freq: Every day | ORAL | Status: DC

## 2012-05-21 NOTE — Assessment & Plan Note (Addendum)
Unclear etiology, will re-check with labs today, ? Related to renal vs chronic dz vs other- b12/iron ok, I asked her to address also with her oncologist  Lab Results  Component Value Date   WBC 10.5 03/31/2012   HGB 9.1* 03/31/2012   HCT 28.5* 03/31/2012   MCV 88.3 03/31/2012   PLT 588.0* 03/31/2012

## 2012-05-21 NOTE — Progress Notes (Signed)
Subjective:    Patient ID: Christine [name], female    DOB: 09/25/1931, 78 y.o.   MRN: [id number]  HPI  Here to f/u after recent noting of Oxygen desat with ambulation per PT at home;  Pt has seen Dr Christine [name], last seen June 2013 after hospn for CP/sob though to be well compensated, still has ongoing PT, followed by [facility] as well, and has yet to see hematology for persistent anemia - phone note states the son wanted this cancelled, as she has an oncologist at [facility] - Dr Christine [name]? Though none listed on the website faculty.  Has also seen GI with constipation now improved.  Here today with c/o one wk onset worsening sob/doe and increased edema bilat LE's. Wt has changed here documented 287 to 264 to curretn 258 since early June 2013 with overall decreased appetite.  Taking ensure to help, Denies worsening reflux, dysphagia, abd pain, n/v, bowel change or blood.  Pt denies chest pain, wheezing, orthopnea, PND, palpitations, dizziness or syncope.  o2 sat on ambulation at 30 ft went from 96% at rest, to 86% today.  Pt denies new neurological symptoms such as new headache, or facial or extremity weakness or numbness   Pt denies polydipsia, polyuria. No overt bleeding or bruising.  Has chronic anemia,  Denies worsening depressive symptoms, suicidal ideation.  Is mostly in wheelchair, family with her today  Past Medical History  Diagnosis Date  . GERD (gastroesophageal reflux disease)   . Hypertension   . Morbid obesity   . Arthritis   . Chronic kidney disease     nephrolithiasis left kidney,s/p open resection  . Status post arthroscopic surgery of right knee   . Chronic back pain   . Pneumonia 07/2011    hx  . Pulmonary embolism 07/2011    hx  . Uterine cancer     endometrial high grade adenocarcinoma  . Uterus cancer, sarcoma     high grade invasive carcinosarcoma  . Myocardial infarction 1989  . Bronchitis   . H/O cardiomegaly   . Lymphedema 09/29/2011  . Diabetes mellitus, diet controlled  10/03/2011  . Chronic kidney disease, stage II (mild) 09/20/2011  . Acute diastolic congestive heart failure, 2D Oct 2012 09/29/2011  . Presence of IVC filter 01/08/2012  . History of colon polyps 01/08/2012  . Chest pain 01/16/2012  . Aortic stenosis, moderate 01/16/2012  . PAF (paroxysmal atrial fibrillation), 01/18/12 01/18/2012  . CAD (coronary artery disease) 03/14/2012  . Nephrolithiasis 03/14/2012  . Diverticulosis 03/14/2012  . Allergic rhinitis, cause unspecified 05/04/2012   Past Surgical History  Procedure Date  . Colonoscopy     multiple  . Knee arthroscopy     right knee  . Cataract extraction, bilateral   . Tubal ligation 1973  . Abdominal hysterectomy 10/05/2008    total with b/l salpingo-oopherectomy  . Kidney stone removal   . Open left knee cartilage repair   . Left index finger surgery     reports that she has never smoked. She has never used smokeless tobacco. She reports that she does not drink alcohol or use illicit drugs. family history includes Breast cancer in her sister; Cervical cancer in her sister; and Colon cancer in her sister. No Known Allergies Current Outpatient Prescriptions on File Prior to Visit  Medication Sig Dispense Refill  . albuterol (PROVENTIL HFA;VENTOLIN HFA) 108 (90 BASE) MCG/ACT inhaler Inhale 2 puffs into the lungs every 6 (six) hours as needed for wheezing.  1 Inhaler  5  .  aspirin EC 81 MG EC tablet Take 1 tablet (81 mg total) by mouth daily.      . cholecalciferol (VITAMIN D) 1000 UNITS tablet Take 1,000 Units by mouth daily.      [name] docusate sodium (COLACE) 100 MG capsule Take 200 mg by mouth 2 (two) times daily. For diarrhea      . dronedarone (MULTAQ) 400 MG tablet Take 1 tablet (400 mg total) by mouth 2 (two) times daily with a meal.  60 tablet  11  . ezetimibe (ZETIA) 10 MG tablet Take 10 mg by mouth daily.       . fexofenadine (ALLEGRA) 180 MG tablet Take 1 tablet (180 mg total) by mouth daily. As needed  30 tablet  11  . furosemide  (LASIX) 40 MG tablet Take 1 tablet (40 mg total) by mouth 2 (two) times daily.  60 tablet  2  . HYDROcodone-acetaminophen (NORCO) 10-325 MG per tablet Take 1 tablet by mouth every 6 (six) hours as needed. For pain.  60 tablet  1  . metoprolol succinate (TOPROL-XL) 25 MG 24 hr tablet Take 1 tablet (25 mg total) by mouth daily.  30 tablet  11  . nitroGLYCERIN (NITROSTAT) 0.4 MG SL tablet Place 0.4 mg under the tongue every 5 (five) minutes as needed. For chest pain. Do not exceed 3 tablets. If pain is not resolved after 3rd tablet call 911.      . Nutritional Supplements (ENSURE PO) Take by mouth daily.      [name] omeprazole (PRILOSEC) 20 MG capsule Take 1 capsule (20 mg total) by mouth daily.  90 capsule  3  . ondansetron (ZOFRAN) 4 MG tablet Uses as directed      . polyethylene glycol (MIRALAX / GLYCOLAX) packet Take 17 g by mouth daily.      [name] senna (SENOKOT) 8.6 MG TABS Take 1 tablet by mouth.      . zolpidem (AMBIEN) 5 MG tablet Take 5 mg by mouth at bedtime as needed. For sleep.       Review of Systems Review of Systems  Constitutional: Negative for diaphoresis and unexpected weight change.  HENT: Negative for tinnitus.   Eyes: Negative for photophobia and visual disturbance.  Respiratory: Negative for choking and stridor.   Gastrointestinal: Negative for vomiting and blood in stool.  Genitourinary: Negative for hematuria and decreased urine volume.  Musculoskeletal: Negative for acute joint swelling Skin: Negative for color change and wound.  Neurological: Negative for tremors and numbness.  Psychiatric/Behavioral: Negative for decreased concentration. The patient is not hyperactive.      Objective:   Physical Exam BP 102/62  Pulse 87  Temp 97.8 F (36.6 C) (Oral)  Ht 5\' 6"  (1.676 m)  Wt 258 lb (117.028 kg)  BMI 41.64 kg/m2  SpO2 95% Physical Exam  VS noted, fatigued appearing in the wheelchair, does not attempt to get up on exam table, though can stand and walk 30 ft with  doe Constitutional: Pt appears well-developed and well-nourished. Christine [name] HENT: Head: Normocephalic.  Right Ear: External ear normal.  Left Ear: External ear normal.  Eyes: Conjunctivae and EOM are normal. Pupils are equal, round, and reactive to light.  Neck: Normal range of motion. Neck supple.  Cardiovascular: Normal rate and regular rhythm.   Pulmonary/Chest: Effort normal and breath sounds with bibas rales Abd:  Soft, NT, non-distended, + BS Neurological: Pt is alert. Not confused Skin: Skin is warm. No erythema. LE's 3+ edema to knees, some worse on the  left - worse than chronic lymphedema for he Psychiatric: Pt behavior is normal. Thought content normal.     Assessment & Plan:

## 2012-05-21 NOTE — Assessment & Plan Note (Addendum)
No CP and reg rhythm by exam today, will ask for ecg but exam c/w uncompensated volume overload, though has overall lost significant wt in the past 3 months;  For incr lasix to 80 bid, cont other meds, f/u 1 wk, also for labs and cxr today Total time for pt hx, exam, review of record with pt in the room, and determination of diagnosis, and plan for further eval and tx is > 40 min   Add:  ECG reviewed as per emr

## 2012-05-21 NOTE — Addendum Note (Signed)
Addended by: Scharlene Gloss B on: 05/21/2012 01:11 PM   Modules accepted: Orders

## 2012-05-21 NOTE — Assessment & Plan Note (Signed)
Unclear etiology, cont the ensure for now

## 2012-05-21 NOTE — Assessment & Plan Note (Signed)
To resume PT when volume improved

## 2012-05-21 NOTE — Assessment & Plan Note (Signed)
endometrial high grade adenocarcinoma  - per [facility] oncology, sees every 6 mo - to continue

## 2012-05-21 NOTE — Patient Instructions (Addendum)
OK to increase the lasix to 80 mg twice per day Continue all other medications as before Please go to XRAY in the Basement for the x-ray test Please go to LAB in the Basement for the blood and/or urine tests to be done today You will be contacted by phone if any changes need to be made immediately.  Otherwise, you will receive a letter about your results with an explanation. You are given the copy of your lab results today, please inquire about the anemia with your oncologist at [facility] Please continue the Ensure as you do Please return in 1 week

## 2012-05-22 ENCOUNTER — Other Ambulatory Visit: Payer: Self-pay | Admitting: Internal Medicine

## 2012-05-22 ENCOUNTER — Encounter: Payer: Self-pay | Admitting: Internal Medicine

## 2012-05-22 ENCOUNTER — Telehealth: Payer: Self-pay

## 2012-05-22 MED ORDER — LEVOFLOXACIN 250 MG PO TABS: 250.0000 mg | ORAL_TABLET | Freq: Every day | ORAL | Status: DC

## 2012-05-22 NOTE — Telephone Encounter (Signed)
Completed and faxed a letter to request records from [facility] for this patient.

## 2012-05-26 ENCOUNTER — Ambulatory Visit: Payer: Medicare Other | Admitting: Radiation Oncology

## 2012-05-28 ENCOUNTER — Other Ambulatory Visit (INDEPENDENT_AMBULATORY_CARE_PROVIDER_SITE_OTHER): Payer: Medicare Other

## 2012-05-28 ENCOUNTER — Ambulatory Visit (INDEPENDENT_AMBULATORY_CARE_PROVIDER_SITE_OTHER): Payer: Medicare Other | Admitting: Internal Medicine

## 2012-05-28 ENCOUNTER — Encounter: Payer: Self-pay | Admitting: Internal Medicine

## 2012-05-28 VITALS — BP 102/68 | HR 89 | Temp 97.7°F | Wt 254.0 lb

## 2012-05-28 DIAGNOSIS — F419 Anxiety disorder, unspecified: Secondary | ICD-10-CM

## 2012-05-28 DIAGNOSIS — R0602 Shortness of breath: Secondary | ICD-10-CM

## 2012-05-28 DIAGNOSIS — I509 Heart failure, unspecified: Secondary | ICD-10-CM

## 2012-05-28 DIAGNOSIS — N184 Chronic kidney disease, stage 4 (severe): Secondary | ICD-10-CM

## 2012-05-28 DIAGNOSIS — I10 Essential (primary) hypertension: Secondary | ICD-10-CM

## 2012-05-28 DIAGNOSIS — F411 Generalized anxiety disorder: Secondary | ICD-10-CM

## 2012-05-28 LAB — BASIC METABOLIC PANEL
CO2: 29 mEq/L (ref 19–32)
Chloride: 96 mEq/L (ref 96–112)
Glucose, Bld: 94 mg/dL (ref 70–99)
Potassium: 3.7 mEq/L (ref 3.5–5.1)
Sodium: 135 mEq/L (ref 135–145)

## 2012-05-28 LAB — CBC WITH DIFFERENTIAL/PLATELET
Basophils Absolute: 0 10*3/uL (ref 0.0–0.1)
Eosinophils Relative: 2 % (ref 0.0–5.0)
HCT: 27.6 % — ABNORMAL LOW (ref 36.0–46.0)
Lymphocytes Relative: 11.9 % — ABNORMAL LOW (ref 12.0–46.0)
Lymphs Abs: 1.6 10*3/uL (ref 0.7–4.0)
Monocytes Relative: 8.4 % (ref 3.0–12.0)
Platelets: 629 10*3/uL — ABNORMAL HIGH (ref 150.0–400.0)
RDW: 17.5 % — ABNORMAL HIGH (ref 11.5–14.6)
WBC: 13.5 10*3/uL — ABNORMAL HIGH (ref 4.5–10.5)

## 2012-05-28 LAB — BRAIN NATRIURETIC PEPTIDE: Pro B Natriuretic peptide (BNP): 58 pg/mL (ref 0.0–100.0)

## 2012-05-28 MED ORDER — CLONAZEPAM 0.5 MG PO TABS: 0.5000 mg | ORAL_TABLET | Freq: Two times a day (BID) | ORAL | Status: DC | PRN

## 2012-05-28 NOTE — Patient Instructions (Addendum)
Take all new medications as prescribed - the medicine for nerves Continue all other medications as before, for now Please go to LAB in the Basement for the blood and/or urine tests to be done today You will be contacted by phone if any changes need to be made immediately.  Otherwise, you will receive a letter about your results with an explanation. Please return in 2 weeks , or sooner if needed

## 2012-05-29 ENCOUNTER — Telehealth: Payer: Self-pay | Admitting: Internal Medicine

## 2012-05-29 DIAGNOSIS — R609 Edema, unspecified: Secondary | ICD-10-CM

## 2012-05-29 DIAGNOSIS — N289 Disorder of kidney and ureter, unspecified: Secondary | ICD-10-CM

## 2012-05-29 NOTE — Telephone Encounter (Signed)
Called informed the patient of referral.  

## 2012-05-29 NOTE — Telephone Encounter (Signed)
Christine [name] to let family know  After further consideration, I think we should refer to Renal to help with her fluid status and kidney function  Will refer

## 2012-05-30 ENCOUNTER — Emergency Department (HOSPITAL_COMMUNITY): Payer: Medicare Other

## 2012-05-30 ENCOUNTER — Inpatient Hospital Stay (HOSPITAL_COMMUNITY)
Admission: EM | Admit: 2012-05-30 | Discharge: 2012-06-03 | DRG: 291 | Disposition: A | Discharge status: Home / Self Care | Payer: Medicare Other | Attending: Internal Medicine | Admitting: Internal Medicine

## 2012-05-30 ENCOUNTER — Encounter (HOSPITAL_COMMUNITY): Payer: Self-pay | Admitting: *Deleted

## 2012-05-30 DIAGNOSIS — N039 Chronic nephritic syndrome with unspecified morphologic changes: Secondary | ICD-10-CM | POA: Diagnosis present

## 2012-05-30 DIAGNOSIS — I509 Heart failure, unspecified: Secondary | ICD-10-CM

## 2012-05-30 DIAGNOSIS — Z86711 Personal history of pulmonary embolism: Secondary | ICD-10-CM

## 2012-05-30 DIAGNOSIS — R0902 Hypoxemia: Secondary | ICD-10-CM | POA: Diagnosis present

## 2012-05-30 DIAGNOSIS — Z8542 Personal history of malignant neoplasm of other parts of uterus: Secondary | ICD-10-CM

## 2012-05-30 DIAGNOSIS — I10 Essential (primary) hypertension: Secondary | ICD-10-CM

## 2012-05-30 DIAGNOSIS — D631 Anemia in chronic kidney disease: Secondary | ICD-10-CM | POA: Diagnosis present

## 2012-05-30 DIAGNOSIS — I2510 Atherosclerotic heart disease of native coronary artery without angina pectoris: Secondary | ICD-10-CM | POA: Diagnosis present

## 2012-05-30 DIAGNOSIS — I480 Paroxysmal atrial fibrillation: Secondary | ICD-10-CM

## 2012-05-30 DIAGNOSIS — I5033 Acute on chronic diastolic (congestive) heart failure: Secondary | ICD-10-CM | POA: Diagnosis present

## 2012-05-30 DIAGNOSIS — I129 Hypertensive chronic kidney disease with stage 1 through stage 4 chronic kidney disease, or unspecified chronic kidney disease: Secondary | ICD-10-CM | POA: Diagnosis present

## 2012-05-30 DIAGNOSIS — I5031 Acute diastolic (congestive) heart failure: Secondary | ICD-10-CM

## 2012-05-30 DIAGNOSIS — J189 Pneumonia, unspecified organism: Secondary | ICD-10-CM | POA: Diagnosis present

## 2012-05-30 DIAGNOSIS — IMO0001 Reserved for inherently not codable concepts without codable children: Secondary | ICD-10-CM | POA: Diagnosis present

## 2012-05-30 DIAGNOSIS — R112 Nausea with vomiting, unspecified: Secondary | ICD-10-CM | POA: Diagnosis present

## 2012-05-30 DIAGNOSIS — D649 Anemia, unspecified: Secondary | ICD-10-CM | POA: Diagnosis present

## 2012-05-30 DIAGNOSIS — Z6841 Body Mass Index (BMI) 40.0 and over, adult: Secondary | ICD-10-CM

## 2012-05-30 DIAGNOSIS — I4891 Unspecified atrial fibrillation: Secondary | ICD-10-CM | POA: Diagnosis present

## 2012-05-30 DIAGNOSIS — N179 Acute kidney failure, unspecified: Secondary | ICD-10-CM | POA: Diagnosis present

## 2012-05-30 DIAGNOSIS — N184 Chronic kidney disease, stage 4 (severe): Secondary | ICD-10-CM | POA: Diagnosis present

## 2012-05-30 LAB — POCT I-STAT, CHEM 8
Calcium, Ion: 1.18 mmol/L (ref 1.13–1.30)
Chloride: 99 mEq/L (ref 96–112)
Glucose, Bld: 98 mg/dL (ref 70–99)
HCT: 32 % — ABNORMAL LOW (ref 36.0–46.0)
Hemoglobin: 10.9 g/dL — ABNORMAL LOW (ref 12.0–15.0)

## 2012-05-30 LAB — DIFFERENTIAL
Eosinophils Absolute: 0.2 10*3/uL (ref 0.0–0.7)
Eosinophils Relative: 1 % (ref 0–5)
Lymphocytes Relative: 10 % — ABNORMAL LOW (ref 12–46)
Lymphs Abs: 1.5 10*3/uL (ref 0.7–4.0)
Monocytes Relative: 11 % (ref 3–12)
Neutrophils Relative %: 78 % — ABNORMAL HIGH (ref 43–77)

## 2012-05-30 LAB — CBC
Hemoglobin: 8.9 g/dL — ABNORMAL LOW (ref 12.0–15.0)
MCH: 28.4 pg (ref 26.0–34.0)
MCH: 28.6 pg (ref 26.0–34.0)
MCV: 90.2 fL (ref 78.0–100.0)
MCV: 90.4 fL (ref 78.0–100.0)
Platelets: 618 10*3/uL — ABNORMAL HIGH (ref 150–400)
Platelets: 608 10*3/uL — ABNORMAL HIGH (ref 150–400)
RBC: 3.11 MIL/uL — ABNORMAL LOW (ref 3.87–5.11)
RDW: 17.1 % — ABNORMAL HIGH (ref 11.5–15.5)
WBC: 14.8 10*3/uL — ABNORMAL HIGH (ref 4.0–10.5)

## 2012-05-30 LAB — URINALYSIS, ROUTINE W REFLEX MICROSCOPIC
Bilirubin Urine: NEGATIVE
Hgb urine dipstick: NEGATIVE
Nitrite: NEGATIVE
Protein, ur: NEGATIVE mg/dL
Specific Gravity, Urine: 1.014 (ref 1.005–1.030)
Urobilinogen, UA: 0.2 mg/dL (ref 0.0–1.0)

## 2012-05-30 LAB — COMPREHENSIVE METABOLIC PANEL
AST: 20 U/L (ref 0–37)
Albumin: 2.2 g/dL — ABNORMAL LOW (ref 3.5–5.2)
BUN: 42 mg/dL — ABNORMAL HIGH (ref 6–23)
Calcium: 8.9 mg/dL (ref 8.4–10.5)
Creatinine, Ser: 2.56 mg/dL — ABNORMAL HIGH (ref 0.50–1.10)

## 2012-05-30 LAB — HEMOGLOBIN A1C
Hgb A1c MFr Bld: 5.4 % (ref <5.7)
Mean Plasma Glucose: 108 mg/dL (ref <117)

## 2012-05-30 LAB — CARDIAC PANEL(CRET KIN+CKTOT+MB+TROPI)
CK, MB: 3.4 ng/mL (ref 0.3–4.0)
CK, MB: 2.8 ng/mL (ref 0.3–4.0)
Relative Index: 1.7 (ref 0.0–2.5)
Total CK: 208 U/L — ABNORMAL HIGH (ref 7–177)
Total CK: 175 U/L (ref 7–177)
Total CK: 165 U/L (ref 7–177)
Troponin I: <0.3 ng/mL (ref <0.30)

## 2012-05-30 LAB — GLUCOSE, CAPILLARY
Glucose-Capillary: 93 mg/dL (ref 70–99)
Glucose-Capillary: 86 mg/dL (ref 70–99)
Glucose-Capillary: 95 mg/dL (ref 70–99)

## 2012-05-30 LAB — APTT: aPTT: 36 seconds (ref 24–37)

## 2012-05-30 LAB — TSH: TSH: 3.202 u[IU]/mL (ref 0.350–4.500)

## 2012-05-30 LAB — PROTIME-INR: INR: 1.13 (ref 0.00–1.49)

## 2012-05-30 MED ORDER — EZETIMIBE 10 MG PO TABS
10.0000 mg | ORAL_TABLET | Freq: Every day | ORAL | Status: DC
  Administered 2012-05-30 – 2012-06-03 (×5): 10 mg via ORAL
  Filled 2012-05-30 (×5): qty 1

## 2012-05-30 MED ORDER — FUROSEMIDE 10 MG/ML IJ SOLN
80.0000 mg | Freq: Two times a day (BID) | INTRAMUSCULAR | Status: DC
  Administered 2012-05-30 – 2012-06-02 (×6): 80 mg via INTRAVENOUS
  Filled 2012-05-30 (×8): qty 8

## 2012-05-30 MED ORDER — FUROSEMIDE 10 MG/ML IJ SOLN
40.0000 mg | Freq: Two times a day (BID) | INTRAMUSCULAR | Status: DC
  Administered 2012-05-30: 40 mg via INTRAVENOUS
  Filled 2012-05-30 (×3): qty 4

## 2012-05-30 MED ORDER — PANTOPRAZOLE SODIUM 40 MG PO TBEC
40.0000 mg | DELAYED_RELEASE_TABLET | Freq: Every day | ORAL | Status: DC
  Administered 2012-05-30 – 2012-06-03 (×5): 40 mg via ORAL
  Filled 2012-05-30 (×5): qty 1

## 2012-05-30 MED ORDER — FUROSEMIDE 10 MG/ML IJ SOLN
40.0000 mg | Freq: Once | INTRAMUSCULAR | Status: AC
  Administered 2012-05-30: 40 mg via INTRAVENOUS
  Filled 2012-05-30: qty 4

## 2012-05-30 MED ORDER — METOPROLOL SUCCINATE ER 25 MG PO TB24
25.0000 mg | ORAL_TABLET | Freq: Every day | ORAL | Status: DC
  Administered 2012-05-30 – 2012-06-03 (×5): 25 mg via ORAL
  Filled 2012-05-30 (×5): qty 1

## 2012-05-30 MED ORDER — SODIUM CHLORIDE 0.9 % IJ SOLN
3.0000 mL | Freq: Two times a day (BID) | INTRAMUSCULAR | Status: DC
  Administered 2012-05-30 – 2012-06-03 (×5): 3 mL via INTRAVENOUS

## 2012-05-30 MED ORDER — ZOLPIDEM TARTRATE 5 MG PO TABS: 5.0000 mg | ORAL_TABLET | Freq: Every evening | ORAL | Status: DC | PRN

## 2012-05-30 MED ORDER — DOCUSATE SODIUM 100 MG PO CAPS
200.0000 mg | ORAL_CAPSULE | Freq: Two times a day (BID) | ORAL | Status: DC
  Administered 2012-05-30 – 2012-06-03 (×9): 200 mg via ORAL
  Filled 2012-05-30 (×10): qty 2

## 2012-05-30 MED ORDER — ONDANSETRON HCL 4 MG/2ML IJ SOLN
4.0000 mg | Freq: Four times a day (QID) | INTRAMUSCULAR | Status: DC | PRN
  Administered 2012-05-30 – 2012-06-03 (×3): 4 mg via INTRAVENOUS
  Filled 2012-05-30 (×3): qty 2

## 2012-05-30 MED ORDER — SODIUM CHLORIDE 0.9 % IV SOLN: 250.0000 mL | INTRAVENOUS | Status: DC | PRN

## 2012-05-30 MED ORDER — POLYETHYLENE GLYCOL 3350 17 G PO PACK
17.0000 g | PACK | Freq: Every day | ORAL | Status: DC
  Administered 2012-05-30 – 2012-06-03 (×5): 17 g via ORAL
  Filled 2012-05-30 (×5): qty 1

## 2012-05-30 MED ORDER — NITROGLYCERIN 0.4 MG SL SUBL: 0.4000 mg | SUBLINGUAL_TABLET | SUBLINGUAL | Status: DC | PRN

## 2012-05-30 MED ORDER — SENNA 8.6 MG PO TABS
1.0000 | ORAL_TABLET | Freq: Every day | ORAL | Status: DC | PRN
  Administered 2012-06-01: 8.6 mg via ORAL
  Filled 2012-05-30: qty 1

## 2012-05-30 MED ORDER — DRONEDARONE HCL 400 MG PO TABS
400.0000 mg | ORAL_TABLET | Freq: Two times a day (BID) | ORAL | Status: DC
  Administered 2012-05-30 – 2012-06-03 (×10): 400 mg via ORAL
  Filled 2012-05-30 (×11): qty 1

## 2012-05-30 MED ORDER — INSULIN ASPART 100 UNIT/ML ~~LOC~~ SOLN: 0.0000 [IU] | Freq: Three times a day (TID) | SUBCUTANEOUS | Status: DC

## 2012-05-30 MED ORDER — HYDROCODONE-ACETAMINOPHEN 10-325 MG PO TABS
1.0000 | ORAL_TABLET | Freq: Four times a day (QID) | ORAL | Status: DC | PRN
  Administered 2012-05-30 – 2012-06-03 (×12): 1 via ORAL
  Filled 2012-05-30 (×12): qty 1

## 2012-05-30 MED ORDER — VITAMIN D3 25 MCG (1000 UNIT) PO TABS
2000.0000 [IU] | ORAL_TABLET | Freq: Every day | ORAL | Status: DC
  Administered 2012-05-30 – 2012-06-03 (×5): 2000 [IU] via ORAL
  Filled 2012-05-30 (×5): qty 2

## 2012-05-30 MED ORDER — LEVOFLOXACIN 250 MG PO TABS
250.0000 mg | ORAL_TABLET | ORAL | Status: AC
  Administered 2012-05-30 – 2012-05-31 (×2): 250 mg via ORAL
  Filled 2012-05-30 (×2): qty 1

## 2012-05-30 MED ORDER — ALBUTEROL SULFATE HFA 108 (90 BASE) MCG/ACT IN AERS
2.0000 | INHALATION_SPRAY | Freq: Four times a day (QID) | RESPIRATORY_TRACT | Status: DC | PRN
  Administered 2012-06-01: 2 via RESPIRATORY_TRACT
  Filled 2012-05-30 (×2): qty 6.7

## 2012-05-30 MED ORDER — SODIUM CHLORIDE 0.9 % IJ SOLN
3.0000 mL | Freq: Two times a day (BID) | INTRAMUSCULAR | Status: DC
  Administered 2012-05-30 – 2012-06-02 (×5): 3 mL via INTRAVENOUS

## 2012-05-30 MED ORDER — ASPIRIN EC 81 MG PO TBEC
81.0000 mg | DELAYED_RELEASE_TABLET | Freq: Every day | ORAL | Status: DC
  Administered 2012-05-30 – 2012-06-03 (×5): 81 mg via ORAL
  Filled 2012-05-30 (×5): qty 1

## 2012-05-30 MED ORDER — SODIUM CHLORIDE 0.9 % IJ SOLN: 3.0000 mL | INTRAMUSCULAR | Status: DC | PRN

## 2012-05-30 MED ORDER — BIOTENE DRY MOUTH MT LIQD
15.0000 mL | Freq: Two times a day (BID) | OROMUCOSAL | Status: DC
  Administered 2012-05-30 – 2012-06-03 (×9): 15 mL via OROMUCOSAL

## 2012-05-30 MED ORDER — INSULIN ASPART 100 UNIT/ML ~~LOC~~ SOLN: 0.0000 [IU] | Freq: Every day | SUBCUTANEOUS | Status: DC

## 2012-05-30 NOTE — Progress Notes (Signed)
Pt vomited approx 100cc of thick biege fluid, 1000 meds held for later time. md notified for prn order of nausea med.

## 2012-05-30 NOTE — Progress Notes (Signed)
  Echocardiogram 2D Echocardiogram has been performed.  Christine [name] 05/30/2012, 11:24 AM

## 2012-05-30 NOTE — ED Notes (Signed)
MD at bedside. 

## 2012-05-30 NOTE — H&P (Addendum)
Christine [name] is an 78 y.o. female.    Pcp: Oliver Barre Cardiologist:  Jeri Cos  Chief Complaint: sob HPI: 78 yo female with CHF (diastolic), aortic stenosis (mild), Pafib, CKD stage 4, Anemia presents with c/o sob, worse with exertion.  + cough with yellow sputum,  =>white (on levaquin),  Denies fever, chills, cp, palp, n/v, diarrhea, brbpr, black stool.  Pt presented to ED, pox 80's with walking,  and CXR=>moderate CHF.  BNP elevated.  Pt notes that her pcp discontinued her Lasix x 1 day ? Due to worsening renal insufficiency.  Pt will be admitted for CHF.      Past Medical History  Diagnosis Date  . GERD (gastroesophageal reflux disease)   . Hypertension   . Morbid obesity   . Arthritis   . Chronic kidney disease     nephrolithiasis left kidney,s/p open resection  . Status post arthroscopic surgery of right knee   . Chronic back pain   . Pneumonia 07/2011    hx  . Pulmonary embolism 07/2011    hx  . Uterine cancer     endometrial high grade adenocarcinoma  . Uterus cancer, sarcoma     high grade invasive carcinosarcoma  . Myocardial infarction 1989  . Bronchitis   . H/O cardiomegaly   . Lymphedema 09/29/2011  . Diabetes mellitus, diet controlled 10/03/2011  . Chronic kidney disease, stage II (mild) 09/20/2011  . Acute diastolic congestive heart failure, 2D Oct 2012 09/29/2011  . Presence of IVC filter 01/08/2012  . History of colon polyps 01/08/2012  . Chest pain 01/16/2012  . Aortic stenosis, moderate 01/16/2012  . PAF (paroxysmal atrial fibrillation), 01/18/12 01/18/2012  . CAD (coronary artery disease) 03/14/2012  . Nephrolithiasis 03/14/2012  . Diverticulosis 03/14/2012  . Allergic rhinitis, cause unspecified 05/04/2012    Past Surgical History  Procedure Date  . Colonoscopy     multiple  . Knee arthroscopy     right knee  . Cataract extraction, bilateral   . Tubal ligation 1973  . Abdominal hysterectomy 10/05/2008    total with b/l salpingo-oopherectomy  .  Kidney stone removal   . Open left knee cartilage repair   . Left index finger surgery     Family History  Problem Relation Age of Onset  . Breast cancer Sister     1st sister  . Colon cancer Sister     2nd sister  . Cervical cancer Sister     3rd sister   Social History:  reports that she has never smoked. She has never used smokeless tobacco. She reports that she does not drink alcohol or use illicit drugs.  Allergies: No Known Allergies   (Not in a hospital admission)  Results for orders placed during the hospital encounter of 05/30/12 (from the past 48 hour(s))  CBC     Status: Abnormal   Collection Time   05/30/12  1:55 AM      Component Value Range Comment   WBC 15.0 (*) 4.0 - 10.5 K/uL    RBC 2.75 (*) 3.87 - 5.11 MIL/uL    Hemoglobin 7.8 (*) 12.0 - 15.0 g/dL    HCT 40.9 (*) 81.1 - 46.0 %    MCV 90.2  78.0 - 100.0 fL    MCH 28.4  26.0 - 34.0 pg    MCHC 31.5  30.0 - 36.0 g/dL    RDW 91.4 (*) 78.2 - 15.5 %    Platelets 618 (*) 150 - 400 K/uL  PRO B NATRIURETIC PEPTIDE     Status: Abnormal   Collection Time   05/30/12  1:55 AM      Component Value Range Comment   Pro B Natriuretic peptide (BNP) 991.0 (*) 0 - 450 pg/mL   POCT I-STAT, CHEM 8     Status: Abnormal   Collection Time   05/30/12  2:02 AM      Component Value Range Comment   Sodium 137  135 - 145 mEq/L    Potassium 3.7  3.5 - 5.1 mEq/L    Chloride 99  96 - 112 mEq/L    BUN 42 (*) 6 - 23 mg/dL    Creatinine, Ser 5.78 (*) 0.50 - 1.10 mg/dL    Glucose, Bld 98  70 - 99 mg/dL    Calcium, Ion 4.69  6.29 - 1.30 mmol/L    TCO2 27  0 - 100 mmol/L    Hemoglobin 10.9 (*) 12.0 - 15.0 g/dL    HCT 52.8 (*) 41.3 - 46.0 %   POCT I-STAT TROPONIN I     Status: Normal   Collection Time   05/30/12  2:05 AM      Component Value Range Comment   Troponin i, poc 0.01  0.00 - 0.08 ng/mL    Comment 3            TYPE AND SCREEN     Status: Normal (Preliminary result)   Collection Time   05/30/12  3:00 AM      Component  Value Range Comment   ABO/RH(D) O POS      Antibody Screen PENDING      Sample Expiration 06/02/2012      Dg Chest Portable 1 View  05/30/2012  *RADIOLOGY REPORT*  Clinical Data: Respiratory distress.  Short of breath.  PORTABLE CHEST - 1 VIEW  Comparison: 05/21/2012.  Findings: Moderate CHF is present with cardiomegaly, pulmonary vascular congestion and interstitial pulmonary edema with basilar predominant alveolar pulmonary edema.  IMPRESSION: Moderate CHF.  Original Report Authenticated By: Andreas Newport, M.D.    Review of Systems  Constitutional: Negative for fever, chills, weight loss, malaise/fatigue and diaphoresis.  HENT: Negative for hearing loss, ear pain, nosebleeds, congestion, neck pain, tinnitus and ear discharge.   Eyes: Negative for blurred vision, double vision, photophobia, pain, discharge and redness.  Respiratory: Positive for sputum production, shortness of breath and wheezing. Negative for cough, hemoptysis and stridor.   Cardiovascular: Positive for orthopnea. Negative for chest pain, palpitations, claudication, leg swelling and PND.  Gastrointestinal: Negative for heartburn, nausea, vomiting, abdominal pain, diarrhea, constipation, blood in stool and melena.  Genitourinary: Negative for dysuria, urgency, frequency, hematuria and flank pain.  Musculoskeletal: Positive for joint pain. Negative for myalgias, back pain and falls.  Skin: Negative for itching and rash.  Neurological: Negative for dizziness, tingling, tremors, sensory change, speech change, focal weakness, seizures and headaches.  Endo/Heme/Allergies: Negative for environmental allergies and polydipsia. Does not bruise/bleed easily.  Psychiatric/Behavioral: Negative for depression, suicidal ideas, hallucinations and substance abuse. The patient is not nervous/anxious and does not have insomnia.     Blood pressure 109/49, pulse 96, temperature 98.1 F (36.7 C), temperature source Oral, resp. rate 27, SpO2  89.00%. Physical Exam  Constitutional: She is oriented to person, place, and time. She appears well-developed and well-nourished.  HENT:  Head: Normocephalic and atraumatic.  Mouth/Throat: No oropharyngeal exudate.  Eyes: Conjunctivae and EOM are normal. Pupils are equal, round, and reactive to light. Right eye exhibits no discharge. Left  eye exhibits no discharge. No scleral icterus.  Neck: Normal range of motion. Neck supple. JVD present. No tracheal deviation present. No thyromegaly present.  Cardiovascular: Normal rate, regular rhythm and normal heart sounds.  Exam reveals no gallop and no friction rub.   No murmur heard. Respiratory: Effort normal. No stridor. No respiratory distress. She has no wheezes. She has rales. She exhibits no tenderness.       1/2 up bilaterally  GI: Soft. Bowel sounds are normal. She exhibits no distension and no mass. There is no tenderness. There is no rebound and no guarding.  Musculoskeletal: Normal range of motion. She exhibits edema. She exhibits no tenderness.       + lymphedema  Lymphadenopathy:    She has no cervical adenopathy.  Neurological: She is alert and oriented to person, place, and time. She has normal reflexes. She displays normal reflexes. No cranial nerve deficit. She exhibits normal muscle tone. Coordination normal.  Skin: Skin is warm and dry. No rash noted. No erythema. No pallor.  Psychiatric: She has a normal mood and affect. Her behavior is normal. Judgment and thought content normal.     Assessment/Plan Dyspnea secondary to CHF Acute CHF likely secondary to discontinuation of lasix Start Lasix 40mg  iv bid, cont metoprolol,  cycle cardiac markers Check cardiac echo Presume not on ace or ARB due to renal insufficiency  Pafib: cont multaq, not on coumadin, due to GI bleeding Bronchitis? pneumonia: cont levaquin 250mg  po qday x 2 more days to complete 10 day course Dm2: fsbs ac and qhs, novolog sensitive sliding scale Anemia:  check cbc in am     Pearson Grippe 05/30/2012, 3:53 AM

## 2012-05-30 NOTE — Evaluation (Signed)
Occupational Therapy Evaluation Patient Details Name: Christine [name] MRN: [id number] DOB: Jun 19, 1931 Today's Date: 05/30/2012 Time: 1545-     OT Assessment / Plan / Recommendation Clinical Impression  This 78 year old female was admitted with congestive heart failure.  She presents with Bil LE lymphedema and has difficulty with bil hands, for which she is being followed by the hand clinic.  at [place], she has help with adls from church members (takes shower several times a week).  Pt has help with LB adls and bed mobility but she has been able to walk to bathroom alone and complete toileting at a mod I level.  Will follow in acute with mod I goal for toileting.      OT Assessment  Patient needs continued OT Services    Follow Up Recommendations  No OT follow up (likely; home health aide)    Barriers to Discharge      Equipment Recommendations  None recommended by OT;None recommended by PT    Recommendations for Other Services    Frequency  Min 2X/week    Precautions / Restrictions Precautions Precautions: None Restrictions Weight Bearing Restrictions: No   Pertinent Vitals/Pain No pain.  Pt on 2 liters 02--dyspnea 2/4; doesn't wear 02 at home    ADL  Eating/Feeding: Simulated;Set up (has decreased sensation in hands) Where Assessed - Eating/Feeding: Chair Grooming: Simulated;Set up Where Assessed - Grooming: Supported sitting Upper Body Bathing: Simulated;Set up Where Assessed - Upper Body Bathing: Unsupported sitting Lower Body Bathing: Simulated;Moderate assistance Where Assessed - Lower Body Bathing: Supported sit to stand Upper Body Dressing: Simulated;Set up Where Assessed - Upper Body Dressing: Supported sitting Lower Body Dressing: Simulated;Maximal assistance Where Assessed - Lower Body Dressing: Supported sit to stand Toilet Transfer: Performed;Minimal Dentist Method: Surveyor, minerals: Environmental education officer and Hygiene: Simulated;Set up Where Assessed - Toileting Clothing Manipulation and Hygiene: Sit to stand from 3-in-1 or toilet ADL Comments: has assist for adls with church ladies.      OT Diagnosis: Generalized weakness  OT Problem List: Decreased strength;Decreased activity tolerance;Cardiopulmonary status limiting activity OT Treatment Interventions: Self-care/ADL training;Balance training;Patient/family education;Therapeutic activities   OT Goals Acute Rehab OT Goals OT Goal Formulation: With patient Time For Goal Achievement: 06/13/12 Potential to Achieve Goals: Good ADL Goals Pt Will Perform Grooming: with modified independence;Standing at sink ADL Goal: Grooming - Progress: Goal set today Pt Will Transfer to Toilet: with modified independence;Ambulation;3-in-1 ADL Goal: Toilet Transfer - Progress: Goal set today Pt Will Perform Toileting - Hygiene: with modified independence;Sit to stand from 3-in-1/toilet ADL Goal: Toileting - Hygiene - Progress: Goal set today  Visit Information  Last OT Received On: 05/30/12 Assistance Needed: +1    Subjective Data  Subjective: I havent had much of an appetite but I can feed myself   Prior Functioning  Vision/Perception  Home Living Lives With: Son Available Help at Discharge: Friend(s);Available PRN/intermittently Type of Home: House Home Access: Level entry Home Layout: One level Bathroom Shower/Tub: Engineer, manufacturing systems: Standard Home Adaptive Equipment: Tub transfer bench;Bedside commode/3-in-1;Walker - rolling Prior Function Level of Independence: Needs assistance (lower body adls) Needs Assistance: Dressing;Bathing Comments: Pt reports her friend from church comes almost everyday to assist with bathing and dressing and she uses RW for mobility around the hosue Communication Communication: No difficulties Dominant Hand: Right      Cognition  Overall Cognitive Status: Appears within  functional limits for tasks assessed/performed Arousal/Alertness: Awake/alert Orientation Level:  Appears intact for tasks assessed Behavior During Session: [facility] for tasks performed    Extremity/Trunk Assessment Right Upper Extremity Assessment RUE ROM/Strength/Tone: Deficits RUE ROM/Strength/Tone Deficits: bil hands numb.  has difficulty making fist at times.  gets injections at hand center.  arom wfls RUE Sensation:  (numbness/tingling bil can feel through) Left Upper Extremity Assessment LUE ROM/Strength/Tone: Deficits LUE ROM/Strength/Tone Deficits: see rue LUE Sensation:  (see rue) Right Lower Extremity Assessment RLE ROM/Strength/Tone: Deficits RLE ROM/Strength/Tone Deficits: moves LEs slowly against gravity, ROM limited by body habitus and LE swelling Left Lower Extremity Assessment LLE ROM/Strength/Tone: Deficits LLE ROM/Strength/Tone Deficits: moves LEs slowly against gravity, ROM limited by body habitus and LE swelling   Mobility Bed Mobility Bed Mobility: Supine to Sit;Sit to Supine Supine to Sit: 4: Min assist;HOB elevated Sit to Supine: 3: Mod assist Details for Bed Mobility Assistance: assist for trunk to rise and then LEs assist onto bed, pt reports assist at home for LEs onto bed as well Transfers Sit to Stand: 4: Min guard;From bed;With upper extremity assist Stand to Sit: 4: Min guard;To bed;With upper extremity assist   Exercise    Balance    End of Session OT - End of Session Activity Tolerance: Patient limited by fatigue Patient left: in bed;with call bell/phone within reach  GO     [facility] 05/30/2012, 4:33 PM Marica Otter, OTR/L [phone] 05/30/2012

## 2012-05-30 NOTE — Care Management Note (Signed)
    Page 1 of 2   06/03/2012     2:03:31 PM   CARE MANAGEMENT NOTE 06/03/2012  Patient:  Christine [name], Christine [name]   Account Number:  [id number]  Date Initiated:  05/30/2012  Documentation initiated by:  Lanier Clam  Subjective/Objective Assessment:   ADMITTED W/SOB.CHF.     Action/Plan:   FROM [facility]ACTIVE [facility] HHRN/PT.HAS ALL RW,3N1,HOSPITAL BED.   Anticipated DC Date:  06/03/2012   Anticipated DC Plan:  HOME W HOME HEALTH SERVICES      DC Planning Services  CM consult      [facility] Choice  Resumption Of Svcs/PTA Provider   Choice offered to / List presented to:  NA        HH arranged  HH-2 PT  HH-1 RN  HH-3 OT      [facility] agency  [facility]   Status of service:  Completed, signed off Medicare Important Message given?   (If response is "NO", the following Medicare IM given date fields will be blank) Date Medicare IM given:   Date Additional Medicare IM given:    Discharge Disposition:  HOME W HOME HEALTH SERVICES  Per UR Regulation:  Reviewed for med. necessity/level of care/duration of stay  If discussed at Long Length of Stay Meetings, dates discussed:    Comments:  06/03/12 [facility] RN,BSN NCM [phone] PATIENT PREFERS GOING HOME W/HH.[facility] SUSAN DALE([place]) INFORMED OF D/C HOME W/HH.DID NOT QUALIFY FOR HOME 02.  06/02/12 Trenae Brunke RN,BSN NCM [phone] [facility] FOLLOWING FOR RESUMPTION OF HHC RN/PT.ALSO FOLLOWED BY [facility] W/HOME RN VISITS 1X/MONTH.PATIENT ALSO HAS PRIVATE SITTER. 05/31/12 1726 Leonie Green [phone] Per pt from home where son Leonette Most works full time but assist with care. PT suggest HHPT. Patient recently active with [facility] for [facility] services. Per pt choice [facility] to continue providing services upon discharge. No other needs specified at this time. Patient has Rw, hospital bed, & cane at home.Pcp: Oliver Barre   05/30/12 Jesyka Slaght RN,BSN NCM [phone] [facility] KRISTEN([place]) FOLLOWING FOR RESUMPTION OF HHC-HHRN/PT IF  APPROPRIATE.RECOMMEND PT/OT EVAL.

## 2012-05-30 NOTE — ED Notes (Signed)
[id number]<SE> Expected date:<BR> Expected time:<BR> Means of arrival:<BR> Comments:<BR> EMS/nausea/vomiting-IV Zofran

## 2012-05-30 NOTE — Evaluation (Signed)
Physical Therapy Evaluation Patient Details Name: Christine [name] MRN: [id number] DOB: May 03, 1931 Today's Date: 05/30/2012 Time: 4098-1191 PT Time Calculation (min): 18 min  PT Assessment / Plan / Recommendation Clinical Impression  Pt admitted for acute diastolic congestive heart failure.  Pt would benefit from acute PT services in order to improve independence with transfers and ambulation and increase activity tolerance to prepare for d/c home.  Pt reports she would be okay to d/c if breathing better.  Recommend HHaide and HHPT if d/c home.    PT Assessment  Patient needs continued PT services    Follow Up Recommendations  Home health PT;Supervision/Assistance - 24 hour    Barriers to Discharge Decreased caregiver support      Equipment Recommendations  None recommended by PT    Recommendations for Other Services     Frequency Min 3X/week    Precautions / Restrictions Precautions Precautions: None   Pertinent Vitals/Pain No pain SaO2 at rest room air 92% SaO2 during ambulation room air 95% Pt feeling SOB upon return to supine SaO2 95% room air so reapplied 1L [place] and elevated HOB     Mobility  Bed Mobility Bed Mobility: Supine to Sit;Sit to Supine Supine to Sit: 4: Min assist;HOB elevated Sit to Supine: 3: Mod assist Details for Bed Mobility Assistance: assist for trunk to rise and then LEs assist onto bed, pt reports assist at home for LEs onto bed as well Transfers Transfers: Sit to Stand;Stand to Sit Sit to Stand: 4: Min guard;From bed;With upper extremity assist Stand to Sit: 4: Min guard;To bed;With upper extremity assist Ambulation/Gait Ambulation/Gait Assistance: 4: Min guard Ambulation Distance (Feet): 40 Feet Assistive device: Rolling walker Ambulation/Gait Assistance Details: pt reports SOB with ambulation however SaO2 95% on room air, pt still felt SOB upon resting in supine with SaO2 95% so reapplied 1L oxygen and had pt perform pursed lip breathing, also  elevated HOB more Gait Pattern: Step-through pattern;Trunk flexed;Decreased stride length;Wide base of support Gait velocity: decreased    Exercises     PT Diagnosis: Difficulty walking  PT Problem List: Decreased activity tolerance;Decreased mobility;Cardiopulmonary status limiting activity;Obesity PT Treatment Interventions: DME instruction;Functional mobility training;Gait training;Patient/family education;Therapeutic activities;Therapeutic exercise   PT Goals Acute Rehab PT Goals PT Goal Formulation: With patient Time For Goal Achievement: 06/06/12 Potential to Achieve Goals: Good Pt will go Supine/Side to Sit: with supervision PT Goal: Supine/Side to Sit - Progress: Goal set today Pt will go Sit to Supine/Side: with min assist PT Goal: Sit to Supine/Side - Progress: Goal set today Pt will go Sit to Stand: with supervision PT Goal: Sit to Stand - Progress: Goal set today Pt will go Stand to Sit: with supervision PT Goal: Stand to Sit - Progress: Goal set today Pt will Ambulate: 51 - 150 feet;with supervision;with least restrictive assistive device PT Goal: Ambulate - Progress: Goal set today  Visit Information  Last PT Received On: 05/30/12 Assistance Needed: +1    Subjective Data  Subjective: I'm having trouble breathing.  (upon laying back down, elevated HOB more and pt more comfortable, pt also on 1L oxygen)   Prior Functioning  Home Living Lives With: Son Available Help at Discharge: Friend(s);Available PRN/intermittently Type of Home: House Home Access: Level entry Home Layout: One level Home Adaptive Equipment: Bedside commode/3-in-1;Shower chair without back;Walker - rolling Prior Function Level of Independence: Independent with assistive device(s);Needs assistance Needs Assistance: Dressing;Bathing Comments: Pt reports her friend from church comes almost everyday to assist with bathing and dressing  and she uses RW for mobility around the  hosue Communication Communication: No difficulties    Cognition  Overall Cognitive Status: Appears within functional limits for tasks assessed/performed Arousal/Alertness: Awake/alert Orientation Level: Appears intact for tasks assessed Behavior During Session: [facility] for tasks performed    Extremity/Trunk Assessment Right Lower Extremity Assessment RLE ROM/Strength/Tone: Deficits RLE ROM/Strength/Tone Deficits: moves LEs slowly against gravity, ROM limited by body habitus and LE swelling Left Lower Extremity Assessment LLE ROM/Strength/Tone: Deficits LLE ROM/Strength/Tone Deficits: moves LEs slowly against gravity, ROM limited by body habitus and LE swelling   Balance    End of Session PT - End of Session Activity Tolerance: Patient limited by fatigue;Other (comment) (DOE) Patient left: in bed;with call bell/phone within reach  GP     [facility] E 05/30/2012, 3:32 PM Pager: [phone]

## 2012-05-30 NOTE — Progress Notes (Signed)
Patient is active with [facility] Care Management with RN [facility] and Pharmacy Medication Management services.  Her last home visit was on 8.8.13.  Current care focus is on medication management due to her difficulty with taking medications.  Medication adjustments for CHF management ongoing prior to admission.  Patient has significant difficulty with tolerating oral medications and is subsequently non adherent to ordered treatments.   Patient is on tele monitoring for weight.  Oral supplements have been provided at reduced cost to patient maintain nutritional stability.  [facility] Care Management will provide the patient with a transition of care call upon discharge.  For any additional questions or new referrals please contact Anibal Henderson BSN RN [facility] Liaison at [phone].

## 2012-05-30 NOTE — ED Provider Notes (Signed)
History     CSN: [id number]  Arrival date & time 05/30/12  4098   First MD Initiated Contact with Patient 05/30/12 0118      Chief Complaint  Patient presents with  . Respiratory Distress    (Consider location/radiation/quality/duration/timing/severity/associated sxs/prior treatment) HPI HX per PT, taking levaquin since 05-22-12, tonight walking to the bathroom became very SOB and EMS was called, feesl improved withO2. No reported hypoxia. No CP, no inc LE swelling. Was taken off of lasix last week by her doctor she believes due to her kidney function, using inhaler as prescribved but it did not help at home tonight. Has h/o CHF, no known recent wt gain. Mod in severity, no symptoms at rest.  Past Medical History  Diagnosis Date  . GERD (gastroesophageal reflux disease)   . Hypertension   . Morbid obesity   . Arthritis   . Chronic kidney disease     nephrolithiasis left kidney,s/p open resection  . Status post arthroscopic surgery of right knee   . Chronic back pain   . Pneumonia 07/2011    hx  . Pulmonary embolism 07/2011    hx  . Uterine cancer     endometrial high grade adenocarcinoma  . Uterus cancer, sarcoma     high grade invasive carcinosarcoma  . Myocardial infarction 1989  . Bronchitis   . H/O cardiomegaly   . Lymphedema 09/29/2011  . Diabetes mellitus, diet controlled 10/03/2011  . Chronic kidney disease, stage II (mild) 09/20/2011  . Acute diastolic congestive heart failure, 2D Oct 2012 09/29/2011  . Presence of IVC filter 01/08/2012  . History of colon polyps 01/08/2012  . Chest pain 01/16/2012  . Aortic stenosis, moderate 01/16/2012  . PAF (paroxysmal atrial fibrillation), 01/18/12 01/18/2012  . CAD (coronary artery disease) 03/14/2012  . Nephrolithiasis 03/14/2012  . Diverticulosis 03/14/2012  . Allergic rhinitis, cause unspecified 05/04/2012    Past Surgical History  Procedure Date  . Colonoscopy     multiple  . Knee arthroscopy     right knee  . Cataract  extraction, bilateral   . Tubal ligation 1973  . Abdominal hysterectomy 10/05/2008    total with b/l salpingo-oopherectomy  . Kidney stone removal   . Open left knee cartilage repair   . Left index finger surgery     Family History  Problem Relation Age of Onset  . Breast cancer Sister     1st sister  . Colon cancer Sister     2nd sister  . Cervical cancer Sister     3rd sister    History  Substance Use Topics  . Smoking status: Never Smoker   . Smokeless tobacco: Never Used  . Alcohol Use: No    OB History    Grav Para Term Preterm Abortions TAB SAB Ect Mult Living   9 5              Review of Systems  Constitutional: Negative for fever and chills.  HENT: Negative for neck pain and neck stiffness.   Eyes: Negative for pain.  Respiratory: Positive for cough and shortness of breath.   Cardiovascular: Negative for chest pain.  Gastrointestinal: Negative for abdominal pain.  Genitourinary: Negative for dysuria.  Musculoskeletal: Negative for back pain.  Skin: Negative for rash.  Neurological: Negative for headaches.  All other systems reviewed and are negative.    Allergies  Review of patient's allergies indicates no known allergies.  Home Medications   Current Outpatient Rx  Name Route Sig  Dispense Refill  . ASPIRIN 81 MG PO TBEC Oral Take 1 tablet (81 mg total) by mouth daily.    [name] VITAMIN D 1000 UNITS PO TABS Oral Take 2,000 Units by mouth daily.     [name] DOCUSATE SODIUM 100 MG PO CAPS Oral Take 200 mg by mouth 2 (two) times daily. For diarrhea    . DRONEDARONE HCL 400 MG PO TABS Oral Take 1 tablet (400 mg total) by mouth 2 (two) times daily with a meal. 60 tablet 11  . EZETIMIBE 10 MG PO TABS Oral Take 10 mg by mouth daily.     . FUROSEMIDE 80 MG PO TABS Oral Take 1 tablet (80 mg total) by mouth daily. 180 tablet 3  . HYDROCODONE-ACETAMINOPHEN 10-325 MG PO TABS Oral Take 1 tablet by mouth every 6 (six) hours as needed. For pain. 60 tablet 1  . LEVOFLOXACIN  250 MG PO TABS Oral Take 1 tablet (250 mg total) by mouth daily. 10 tablet 0  . METOPROLOL SUCCINATE ER 25 MG PO TB24 Oral Take 1 tablet (25 mg total) by mouth daily. 30 tablet 11  . ENSURE PO Oral Take 1 each by mouth 2 (two) times daily as needed. For supplement    . OMEPRAZOLE 20 MG PO CPDR Oral Take 1 capsule (20 mg total) by mouth daily. 90 capsule 3  . POLYETHYLENE GLYCOL 3350 PO PACK Oral Take 17 g by mouth daily.    . SENNA 8.6 MG PO TABS Oral Take 1 tablet by mouth.    . ZOLPIDEM TARTRATE 5 MG PO TABS Oral Take 5-10 mg by mouth at bedtime as needed. For sleep.    [name] NITROGLYCERIN 0.4 MG SL SUBL Sublingual Place 0.4 mg under the tongue every 5 (five) minutes as needed. For chest pain. Do not exceed 3 tablets. If pain is not resolved after 3rd tablet call 911.      BP 109/49  Pulse 91  Temp 98.1 F (36.7 C) (Oral)  Resp 16  SpO2 100%  Physical Exam  Constitutional: She is oriented to person, place, and time. She appears well-developed and well-nourished.  HENT:  Head: Normocephalic and atraumatic.  Eyes: Conjunctivae and EOM are normal. Pupils are equal, round, and reactive to light.  Neck: Trachea normal. Neck supple. No thyromegaly present.  Cardiovascular: Normal rate, regular rhythm, S1 normal, S2 normal and normal pulses.   Murmur heard.  Systolic murmur is present with a grade of 3/6  Pulses:      Radial pulses are 2+ on the right side, and 2+ on the left side.  Pulmonary/Chest: Effort normal and breath sounds normal. She has no wheezes. She has no rhonchi. She has no rales. She exhibits no tenderness.  Abdominal: Soft. Normal appearance and bowel sounds are normal. There is no tenderness. There is no CVA tenderness and negative Murphy's sign.  Musculoskeletal:       BLE:s Calves nontender, no cords or erythema, negative Homans sign  Neurological: She is alert and oriented to person, place, and time. She has normal strength. No cranial nerve deficit or sensory deficit. GCS  eye subscore is 4. GCS verbal subscore is 5. GCS motor subscore is 6.  Skin: Skin is warm and dry. No rash noted. She is not diaphoretic.  Psychiatric: Her speech is normal.       Cooperative and appropriate    ED Course  Procedures (including critical care time)  Results for orders placed during the hospital encounter of 05/30/12  CBC  Component Value Range   WBC 15.0 (*) 4.0 - 10.5 K/uL   RBC 2.75 (*) 3.87 - 5.11 MIL/uL   Hemoglobin 7.8 (*) 12.0 - 15.0 g/dL   HCT 16.1 (*) 09.6 - 04.5 %   MCV 90.2  78.0 - 100.0 fL   MCH 28.4  26.0 - 34.0 pg   MCHC 31.5  30.0 - 36.0 g/dL   RDW 40.9 (*) 81.1 - 91.4 %   Platelets 618 (*) 150 - 400 K/uL  PRO B NATRIURETIC PEPTIDE      Component Value Range   Pro B Natriuretic peptide (BNP) 991.0 (*) 0 - 450 pg/mL  POCT I-STAT, CHEM 8      Component Value Range   Sodium 137  135 - 145 mEq/L   Potassium 3.7  3.5 - 5.1 mEq/L   Chloride 99  96 - 112 mEq/L   BUN 42 (*) 6 - 23 mg/dL   Creatinine, Ser 7.82 (*) 0.50 - 1.10 mg/dL   Glucose, Bld 98  70 - 99 mg/dL   Calcium, Ion 9.56  2.13 - 1.30 mmol/L   TCO2 27  0 - 100 mmol/L   Hemoglobin 10.9 (*) 12.0 - 15.0 g/dL   HCT 08.6 (*) 57.8 - 46.9 %  POCT I-STAT TROPONIN I      Component Value Range   Troponin i, poc 0.01  0.00 - 0.08 ng/mL   Comment 3            Dg Chest 2 View  05/21/2012  *RADIOLOGY REPORT*  Clinical Data: Cough, shortness of breath, follow up pleural effusion  CHEST - 2 VIEW  Comparison: 03/22/2012  Findings: Cardiomegaly again noted.  Central mild vascular congestion without convincing pulmonary edema.  Thickening of the right minor fissure noted. Central mild bronchitic changes. Persistent bilateral small pleural effusion with streaky bilateral basilar atelectasis or infiltrate.  IMPRESSION: Central mild bronchitic changes.  Central mild vascular congestion without pulmonary edema.  Persistent small bilateral pleural effusion with streaky bilateral basilar atelectasis or infiltrate.   Original Report Authenticated By: Natasha Mead, M.D.   Dg Chest Portable 1 View  05/30/2012  *RADIOLOGY REPORT*  Clinical Data: Respiratory distress.  Short of breath.  PORTABLE CHEST - 1 VIEW  Comparison: 05/21/2012.  Findings: Moderate CHF is present with cardiomegaly, pulmonary vascular congestion and interstitial pulmonary edema with basilar predominant alveolar pulmonary edema.  IMPRESSION: Moderate CHF.  Original Report Authenticated By: Andreas Newport, M.D.     Date: 05/30/2012  Rate: 81  Rhythm: normal sinus rhythm  QRS Axis: left  Intervals: normal  ST/T Wave abnormalities: nonspecific ST changes  Conduction Disutrbances:none  Narrative Interpretation: LVH  Old EKG Reviewed: unchanged  With ambulation oxygen saturations dropped to the 80s and patient becomes very dyspneic. Improved with oxygen and is relatively asymptomatic at rest. Labs obtained and reviewed as above. IV Lasix initiated. Medicine consult for admission. Case discussed with Dr. Selena Batten at 3:26 AM and will admit patient.  Type and screen for anemia. MDM   Vital signs reviewed. Nursing notes reviewed. Old records reviewed. Oxygen provided. Lasix provided. Chest x-ray, EKG and labs reviewed as above.        Sunnie Nielsen, MD 05/30/12 [phone]

## 2012-05-30 NOTE — ED Notes (Signed)
Per EMS report; pt from home: hx of pneumonia, CHF, DM.  Been having trouble breathing for the last couple of months and had been dx w/ pneumonia about a week ago.  Lung sounds clear.  BP 138/76, 96% [place] 3L, HR: 74.  No respiratory distress noted.

## 2012-05-30 NOTE — Progress Notes (Signed)
PCP: Oliver Barre, MD  Brief HPI: 78 yo female with CHF (diastolic), aortic stenosis (mild), P-afib, CKD stage 4, Anemia presents with c/o sob, worse with exertion. Had cough with yellow sputum, which changed to white (on levaquin). Denied fever, chills, cp, palp, n/v, diarrhea, brbpr, black stool. Pt presented to ED, pox 80's with walking, and CXR suggested moderate CHF. BNP elevated. Pt notes that her pcp discontinued her Lasix x 1 day due to worsening renal insufficiency. Pt was be admitted for CHF.   Past medical history:  Past Medical History  Diagnosis Date  . GERD (gastroesophageal reflux disease)   . Hypertension   . Morbid obesity   . Arthritis   . Chronic kidney disease     nephrolithiasis left kidney,s/p open resection  . Status post arthroscopic surgery of right knee   . Chronic back pain   . Pneumonia 07/2011    hx  . Pulmonary embolism 07/2011    hx  . Uterine cancer     endometrial high grade adenocarcinoma  . Uterus cancer, sarcoma     high grade invasive carcinosarcoma  . Myocardial infarction 1989  . Bronchitis   . H/O cardiomegaly   . Lymphedema 09/29/2011  . Diabetes mellitus, diet controlled 10/03/2011  . Chronic kidney disease, stage II (mild) 09/20/2011  . Acute diastolic congestive heart failure, 2D Oct 2012 09/29/2011  . Presence of IVC filter 01/08/2012  . History of colon polyps 01/08/2012  . Chest pain 01/16/2012  . Aortic stenosis, moderate 01/16/2012  . PAF (paroxysmal atrial fibrillation), 01/18/12 01/18/2012  . CAD (coronary artery disease) 03/14/2012  . Nephrolithiasis 03/14/2012  . Diverticulosis 03/14/2012  . Allergic rhinitis, cause unspecified 05/04/2012    Consultants: None   Procedures: None  Subjective: Patient still short of breath. Denies chest pain. Admits to leg swelling over last few days. Had nausea and vomiting this morning. Denies abdominal pain.  Objective: Vital signs in last 24 hours: Temp:  [98 F (36.7 C)-98.5 F (36.9 C)]  98.5 F (36.9 C) (08/16 0505) Pulse Rate:  [86-96] 91  (08/16 0505) Resp:  [16-27] 16  (08/16 0505) BP: (109-126)/(46-69) 112/69 mmHg (08/16 0505) SpO2:  [89 %-100 %] 100 % (08/16 0505) Weight:  [115.032 kg (253 lb 9.6 oz)] 115.032 kg (253 lb 9.6 oz) (08/16 0500) Weight change:  Last BM Date: 05/27/12  Intake/Output from previous day:   Intake/Output this shift: Total I/O In: 20 [P.O.:20] Out: -   General appearance: alert, cooperative, appears stated age, no distress and morbidly obese Head: Normocephalic, without obvious abnormality, atraumatic Neck: no adenopathy, no carotid bruit, no JVD, supple, symmetrical, trachea midline and thyroid not enlarged, symmetric, no tenderness/mass/nodules Back: symmetric, no curvature. ROM normal. No CVA tenderness. Resp: Crackles half way up lung fields bilaterally. no wheezing. Cardio: regular rate and rhythm, S1, S2 normal. Systolic murmur over aortic area. No click, rub or gallop GI: soft, non-tender; bowel sounds normal; no masses,  no organomegaly Extremities: Significant lymphedema with pitting edema as well. Pulses: Difficult to palpate due to edema. Radial was present Skin: Skin color, texture, turgor normal. No rashes or lesions Lymph nodes: Cervical, supraclavicular, and axillary nodes normal. Neurologic: Alert and oriented x 3. No focal deficits.  Lab Results:  Basename 05/30/12 0530 05/30/12 0202 05/30/12 0155  WBC 14.8* -- 15.0*  HGB 8.9* 10.9* --  HCT 28.1* 32.0* --  PLT 608* -- 618*   BMET  Basename 05/30/12 0530 05/30/12 0202 05/28/12 1515  NA 135 137 --  K 3.7 3.7 --  CL 95* 99 --  CO2 29 -- 29  GLUCOSE 93 98 --  BUN 42* 42* --  CREATININE 2.56* 2.50* --  CALCIUM 8.9 -- 8.9  ALT 7 -- --    Studies/Results: Dg Chest Portable 1 View  05/30/2012  *RADIOLOGY REPORT*  Clinical Data: Respiratory distress.  Short of breath.  PORTABLE CHEST - 1 VIEW  Comparison: 05/21/2012.  Findings: Moderate CHF is present with  cardiomegaly, pulmonary vascular congestion and interstitial pulmonary edema with basilar predominant alveolar pulmonary edema.  IMPRESSION: Moderate CHF.  Original Report Authenticated By: Andreas Newport, M.D.    Medications:  Scheduled:   . antiseptic oral rinse  15 mL Mouth Rinse BID  . aspirin EC  81 mg Oral Daily  . cholecalciferol  2,000 Units Oral Daily  . docusate sodium  200 mg Oral BID  . dronedarone  400 mg Oral BID WC  . ezetimibe  10 mg Oral Daily  . furosemide  40 mg Intravenous Once  . furosemide  40 mg Intravenous BID  . insulin aspart  0-5 Units Subcutaneous QHS  . insulin aspart  0-9 Units Subcutaneous TID WC  . levofloxacin  250 mg Oral Q24 Hr x 2  . metoprolol succinate  25 mg Oral Daily  . pantoprazole  40 mg Oral Q1200  . polyethylene glycol  17 g Oral Daily  . sodium chloride  3 mL Intravenous Q12H  . sodium chloride  3 mL Intravenous Q12H   Continuous:  ZOX:WRUEAV chloride, albuterol, HYDROcodone-acetaminophen, nitroGLYCERIN, ondansetron (ZOFRAN) IV, senna, sodium chloride, zolpidem  Assessment/Plan:  Principal Problem:  *Acute diastolic congestive heart failure, 2D Oct 2012 Active Problems:  CKD (chronic kidney disease) stage 4, GFR 15-29 ml/min  Type II or unspecified type diabetes mellitus without mention of complication, uncontrolled  Acute on chronic renal failure  CAD (coronary artery disease)  Anemia, unspecified  Nausea and/or vomiting    Acute Diastolic CHF Increase dose of lasix. ECHo is pending. Last ECHO was in 2010 and showed normal systolic function. No ACEI due to renal dysfunction. Strict I/O. Daily weights. Monitor electrolytes. Will notify her cardiologist.  Acute on Chronic Renal Failure (Stage 4) Baseline creat appears to be between 1.5 and 2.0 though it has been greater than 2 at times. Continue to monitor with diuresis. Underwent renal US earlier this year which showed chronic disease.  Anemia Likely secondary to chronic  disease. Monitor. Anemia panel pending.  Nausea and Vomiting Likely from CHF. Abdomen is benign. Continue to monitor. Check UA.  H/O CAD Stable. Troponin is negative. Had recent cath in April without significant obstruction.  Questionable Recent Pneumonia/Bronchitis Complete course of antibiotics  H/O Paroxysmal A Fib Stable. Continue Dronedarone. Not on anti-coagulation due to h/o GI bleed.  H/O PE Has IVC filter  H/O HTN Monitor. On home meds.  Code Status Full Code  DVT Prophylaxis SCD's    LOS: 0 days   [facility]  [facility] Pager [phone] 05/30/2012, 12:36 PM

## 2012-05-31 ENCOUNTER — Encounter: Payer: Self-pay | Admitting: Internal Medicine

## 2012-05-31 DIAGNOSIS — F419 Anxiety disorder, unspecified: Secondary | ICD-10-CM | POA: Insufficient documentation

## 2012-05-31 DIAGNOSIS — I2510 Atherosclerotic heart disease of native coronary artery without angina pectoris: Secondary | ICD-10-CM

## 2012-05-31 LAB — BASIC METABOLIC PANEL
BUN: 40 mg/dL — ABNORMAL HIGH (ref 6–23)
Calcium: 8.7 mg/dL (ref 8.4–10.5)
Creatinine, Ser: 2.52 mg/dL — ABNORMAL HIGH (ref 0.50–1.10)
GFR calc Af Amer: 20 mL/min — ABNORMAL LOW (ref >90)
GFR calc non Af Amer: 17 mL/min — ABNORMAL LOW (ref >90)
Glucose, Bld: 81 mg/dL (ref 70–99)

## 2012-05-31 LAB — CBC
Hemoglobin: 7.5 g/dL — ABNORMAL LOW (ref 12.0–15.0)
MCH: 28.1 pg (ref 26.0–34.0)
MCHC: 31.1 g/dL (ref 30.0–36.0)
RDW: 17.3 % — ABNORMAL HIGH (ref 11.5–15.5)

## 2012-05-31 LAB — RETICULOCYTES
RBC.: 2.63 MIL/uL — ABNORMAL LOW (ref 3.87–5.11)
Retic Ct Pct: 2.3 % (ref 0.4–3.1)

## 2012-05-31 LAB — IRON AND TIBC
Saturation Ratios: 23 % (ref 20–55)
TIBC: 115 ug/dL — ABNORMAL LOW (ref 250–470)
UIBC: 88 ug/dL — ABNORMAL LOW (ref 125–400)

## 2012-05-31 NOTE — Progress Notes (Signed)
PCP: Oliver Barre, MD  Brief HPI: 78 yo female with CHF (diastolic), aortic stenosis (mild), P-afib, CKD stage 4, Anemia presents with c/o sob, worse with exertion. Had cough with yellow sputum, which changed to white (on levaquin). Denied fever, chills, cp, palp, n/v, diarrhea, brbpr, black stool. Pt presented to ED, pox 80's with walking, and CXR suggested moderate CHF. BNP elevated. Pt notes that her pcp discontinued her Lasix x 1 day due to worsening renal insufficiency. Pt was be admitted for CHF.   Past medical history:  Past Medical History  Diagnosis Date  . GERD (gastroesophageal reflux disease)   . Hypertension   . Morbid obesity   . Arthritis   . Chronic kidney disease     nephrolithiasis left kidney,s/p open resection  . Status post arthroscopic surgery of right knee   . Chronic back pain   . Pneumonia 07/2011    hx  . Pulmonary embolism 07/2011    hx  . Uterine cancer     endometrial high grade adenocarcinoma  . Uterus cancer, sarcoma     high grade invasive carcinosarcoma  . Myocardial infarction 1989  . Bronchitis   . H/O cardiomegaly   . Lymphedema 09/29/2011  . Diabetes mellitus, diet controlled 10/03/2011  . Chronic kidney disease, stage II (mild) 09/20/2011  . Acute diastolic congestive heart failure, 2D Oct 2012 09/29/2011  . Presence of IVC filter 01/08/2012  . History of colon polyps 01/08/2012  . Chest pain 01/16/2012  . Aortic stenosis, moderate 01/16/2012  . PAF (paroxysmal atrial fibrillation), 01/18/12 01/18/2012  . CAD (coronary artery disease) 03/14/2012  . Nephrolithiasis 03/14/2012  . Diverticulosis 03/14/2012  . Allergic rhinitis, cause unspecified 05/04/2012    Consultants: None   Procedures: None  Subjective: Patient states that she is breathing better. Had transient chest pain yesterday but it went away quickly. Denies vomiting but has nausea. No abdominal pain. Urinating well.  Objective: Vital signs in last 24 hours: Temp:  [97.4 F (36.3  C)-98.1 F (36.7 C)] 97.5 F (36.4 C) (08/17 0500) Pulse Rate:  [73-81] 81  (08/17 0500) Resp:  [18-20] 20  (08/17 0500) BP: (92-119)/(54-77) 95/54 mmHg (08/17 0500) SpO2:  [100 %] 100 % (08/17 0500) Weight:  [115.894 kg (255 lb 8 oz)] 115.894 kg (255 lb 8 oz) (08/17 0500) Weight change: 0.862 kg (1 lb 14.4 oz) Last BM Date: 05/27/12  Intake/Output from previous day: 08/16 0701 - 08/17 0700 In: 425 [P.O.:425] Out: 1525 [Urine:1525] Intake/Output this shift:    General appearance: alert, cooperative, appears stated age, no distress and morbidly obese Head: Normocephalic, without obvious abnormality, atraumatic Neck: no adenopathy, no carotid bruit, no JVD, supple, symmetrical, trachea midline and thyroid not enlarged, symmetric, no tenderness/mass/nodules Resp: Crackles conitnue to be present. Mildly improved.  Cardio: regular rate and rhythm, S1, S2 normal. Systolic murmur over aortic area. No click, rub or gallop GI: soft, non-tender; bowel sounds normal; no masses,  no organomegaly Extremities: Significant lymphedema with pitting edema as well. Pulses: Difficult to palpate due to edema. Radial was present Neurologic: Alert and oriented x 3. No focal deficits.  Lab Results:  Basename 05/31/12 0539 05/30/12 0530  WBC 11.5* 14.8*  HGB 7.5* 8.9*  HCT 23.8* 28.1*  PLT 494* 608*   BMET  Basename 05/31/12 0539 05/30/12 0530  NA 134* 135  K 3.7 3.7  CL 96 95*  CO2 30 29  GLUCOSE 81 93  BUN 40* 42*  CREATININE 2.52* 2.56*  CALCIUM 8.7 8.9  ALT -- 7    Studies/Results: Dg Chest Portable 1 View  05/30/2012  *RADIOLOGY REPORT*  Clinical Data: Respiratory distress.  Short of breath.  PORTABLE CHEST - 1 VIEW  Comparison: 05/21/2012.  Findings: Moderate CHF is present with cardiomegaly, pulmonary vascular congestion and interstitial pulmonary edema with basilar predominant alveolar pulmonary edema.  IMPRESSION: Moderate CHF.  Original Report Authenticated By: Andreas Newport,  M.D.   2D ECHOCARDIOGRAM Study Conclusions  - Left ventricle: The cavity size was normal. Wall thickness was normal. Systolic function was normal. The estimated ejection fraction was in the range of 55% to 60%. Wall motion was normal; there were no regional wall motion abnormalities. Doppler parameters are consistent with abnormal left ventricular relaxation (grade 1 diastolic dysfunction). Equivocal findings for elevated mean left atrial pressure. - Aortic valve: There was moderate stenosis. Valve area: 1.37cm^2(VTI). Valve area: 1.25cm^2 (Vmax). Valve area: 1.22cm^2 (Vmean). - Mitral valve: Calcified annulus. Mildly thickened leaflets - Left atrium: The atrium was mildly dilated.  - Atrial septum: No defect or patent foramen ovale was identified. - Pulmonary arteries: Systolic pressure was mildly increased. PA peak pressure: 40mm Hg (S).    Medications:  Scheduled:    . antiseptic oral rinse  15 mL Mouth Rinse BID  . aspirin EC  81 mg Oral Daily  . cholecalciferol  2,000 Units Oral Daily  . docusate sodium  200 mg Oral BID  . dronedarone  400 mg Oral BID WC  . ezetimibe  10 mg Oral Daily  . furosemide  80 mg Intravenous BID  . insulin aspart  0-5 Units Subcutaneous QHS  . insulin aspart  0-9 Units Subcutaneous TID WC  . levofloxacin  250 mg Oral Q24 Hr x 2  . metoprolol succinate  25 mg Oral Daily  . pantoprazole  40 mg Oral Q1200  . polyethylene glycol  17 g Oral Daily  . sodium chloride  3 mL Intravenous Q12H  . sodium chloride  3 mL Intravenous Q12H  . DISCONTD: furosemide  40 mg Intravenous BID   Continuous:  ZOX:WRUEAV chloride, albuterol, HYDROcodone-acetaminophen, nitroGLYCERIN, ondansetron (ZOFRAN) IV, senna, sodium chloride, zolpidem  Assessment/Plan:  Principal Problem:  *Acute diastolic congestive heart failure, 2D Oct 2012 Active Problems:  CKD (chronic kidney disease) stage 4, GFR 15-29 ml/min  Type II or unspecified type diabetes mellitus without mention of  complication, uncontrolled  Acute on chronic renal failure  CAD (coronary artery disease)  Anemia, unspecified  Nausea and/or vomiting    Acute Diastolic CHF Slight improvement. Conitnue current management. If not much better by tomorrow will involve Cardiology. ECHo report is mentioned above. No ACEI due to renal dysfunction. Strict I/O. Daily weights. Monitor electrolytes. BP is borderline. Monitor closely.  Acute on Chronic Renal Failure (Stage 4) Baseline creat appears to be between 1.5 and 2.0 though it has been greater than 2 at times. Continue to monitor with diuresis. Underwent renal US earlier this year which showed chronic disease. Creatinine not much different. Continue current management. May need to involve Nephrology.  Anemia Likely secondary to chronic disease. Monitor. Anemia panel pending.  Nausea and Vomiting Likely from CHF. Abdomen is benign. Continue to monitor. UA was normal.  H/O CAD Stable. Troponin is negative. Had recent cath in April without significant obstruction.  Questionable Recent Pneumonia/Bronchitis Complete course of antibiotics  H/O Paroxysmal A Fib Stable. Continue Dronedarone. Not on anti-coagulation due to h/o GI bleed.  H/O PE Has IVC filter  H/O HTN Monitor. On home meds.  Code Status  Full Code  DVT Prophylaxis SCD's    LOS: 1 day   [facility]  [facility] Pager [phone] 05/31/2012, 11:13 AM

## 2012-05-31 NOTE — Progress Notes (Signed)
C/o sob, after getting up to bsc. Sat was 97%,o2 remains on 0.5liter. C/o nausea and vomited approx 75cc of food.

## 2012-05-31 NOTE — Progress Notes (Signed)
Subjective:    Patient ID: Christine [name], female    DOB: 04-24-1931, 78 y.o.   MRN: [id number]  HPI  Here to f/u after recnet increased lasix, with cxr c/w volume overload and ? Pna, now on levaquin as well. No fever, worsening cough, sob.  Wt decreased 4 lbs in 1 wk.  Pt denies chest pain, wheezing, orthopnea, PND, palpitations, dizziness or syncope.   Pt denies fever, night sweats, loss of appetite, or other constitutional symptoms.  Pt denies polydipsia, polyuria.  Pt denies new neurological symptoms such as new headache, or facial or extremity weakness or numbness  Denies worsening depressive symptoms, suicidal ideation, or panic, but has had more anxiety.   No other new complaints Past Medical History  Diagnosis Date  . GERD (gastroesophageal reflux disease)   . Hypertension   . Morbid obesity   . Arthritis   . Chronic kidney disease     nephrolithiasis left kidney,s/p open resection  . Status post arthroscopic surgery of right knee   . Chronic back pain   . Pneumonia 07/2011    hx  . Pulmonary embolism 07/2011    hx  . Uterine cancer     endometrial high grade adenocarcinoma  . Uterus cancer, sarcoma     high grade invasive carcinosarcoma  . Myocardial infarction 1989  . Bronchitis   . H/O cardiomegaly   . Lymphedema 09/29/2011  . Diabetes mellitus, diet controlled 10/03/2011  . Chronic kidney disease, stage II (mild) 09/20/2011  . Acute diastolic congestive heart failure, 2D Oct 2012 09/29/2011  . Presence of IVC filter 01/08/2012  . History of colon polyps 01/08/2012  . Chest pain 01/16/2012  . Aortic stenosis, moderate 01/16/2012  . PAF (paroxysmal atrial fibrillation), 01/18/12 01/18/2012  . CAD (coronary artery disease) 03/14/2012  . Nephrolithiasis 03/14/2012  . Diverticulosis 03/14/2012  . Allergic rhinitis, cause unspecified 05/04/2012   Past Surgical History  Procedure Date  . Colonoscopy     multiple  . Knee arthroscopy     right knee  . Cataract extraction, bilateral    . Tubal ligation 1973  . Abdominal hysterectomy 10/05/2008    total with b/l salpingo-oopherectomy  . Kidney stone removal   . Open left knee cartilage repair   . Left index finger surgery     reports that she has never smoked. She has never used smokeless tobacco. She reports that she does not drink alcohol or use illicit drugs. family history includes Breast cancer in her sister; Cervical cancer in her sister; and Colon cancer in her sister. No Known Allergies No current facility-administered medications on file prior to visit.   Current Outpatient Prescriptions on File Prior to Visit  Medication Sig Dispense Refill  . cholecalciferol (VITAMIN D) 1000 UNITS tablet Take 2,000 Units by mouth daily.       [name] docusate sodium (COLACE) 100 MG capsule Take 200 mg by mouth 2 (two) times daily. For diarrhea      . ezetimibe (ZETIA) 10 MG tablet Take 10 mg by mouth daily.       . nitroGLYCERIN (NITROSTAT) 0.4 MG SL tablet Place 0.4 mg under the tongue every 5 (five) minutes as needed. For chest pain. Do not exceed 3 tablets. If pain is not resolved after 3rd tablet call 911.      . Nutritional Supplements (ENSURE PO) Take 1 each by mouth 2 (two) times daily as needed. For supplement      . polyethylene glycol (MIRALAX / GLYCOLAX) packet  Take 17 g by mouth daily.      [name] senna (SENOKOT) 8.6 MG TABS Take 1 tablet by mouth.      . zolpidem (AMBIEN) 5 MG tablet Take 5-10 mg by mouth at bedtime as needed. For sleep.       Review of Systems Constitutional: Negative for diaphoresis and unexpected weight change.  HENT: Negative for  tinnitus.   Eyes: Negative for photophobia and visual disturbance.  Respiratory: Negative for choking and stridor.   Gastrointestinal: Negative for vomiting and blood in stool.  Genitourinary: Negative for hematuria and decreased urine volume.  Musculoskeletal: Negative for acute joint swelling Skin: Negative for color change and wound.  Neurological: Negative for  tremors and numbness.  Psychiatric/Behavioral: Negative for decreased concentration. The patient is not hyperactive.      Objective:   Physical Exam BP 102/68  Pulse 89  Temp 97.7 F (36.5 C) (Oral)  Wt 254 lb (115.214 kg)  SpO2 96% Physical Exam  VS noted Constitutional: Pt appears well-developed and well-nourished.  HENT: Head: Normocephalic.  Right Ear: External ear normal.  Left Ear: External ear normal.  Eyes: Conjunctivae and EOM are normal. Pupils are equal, round, and reactive to light.  Neck: Normal range of motion. Neck supple.  Cardiovascular: Normal rate and regular rhythm.   Pulmonary/Chest: Effort normal and breath sounds w/few bibas rales.  Abd:  Soft, NT, non-distended, + BS Neurological: Pt is alert. Skin: Skin is warm. No erythema.  Psychiatric: Pt behavior is normal, 1+ nervous    Assessment & Plan:

## 2012-05-31 NOTE — Assessment & Plan Note (Signed)
For f/u renal fx, K today,  to f/u any worsening symptoms or concerns

## 2012-05-31 NOTE — Progress Notes (Signed)
Per pt from home where son Leonette Most works full time but assist with care. PT suggest HHPT. Patient recently active with [facility] for [facility] services. Per pt choice [facility] to continue providing services upon discharge. No other needs specified at this time. Patient has Rw, hospital bed, & cane at home.Pcp: Orinda Kenner Raziyah Vanvleck,Rn,BSN [phone]

## 2012-05-31 NOTE — Assessment & Plan Note (Signed)
To cont med for now, check renal fxn on increased lasix

## 2012-05-31 NOTE — Assessment & Plan Note (Signed)
stable overall by hx and exam, most recent data reviewed with pt, and pt to continue medical treatment as before BP Readings from Last 3 Encounters:  05/31/12 105/60  05/28/12 102/68  05/21/12 102/62

## 2012-05-31 NOTE — Consult Note (Signed)
[facility]       CONSULTATION NOTE   Reason for Consult: CHF   Requesting Physician: Dr. Rito Ehrlich  Cardiologist: Dr. Allyson Sabal  HPI: This is a 78 y.o. female with a past medical history significant for mild CAD by [facility] (performed by myself) in April of 2013. She also has moderate aortic stenosis, atrial fibrillation on Multaq, a history of acute on chronic renal failure and diastolic dysfunction +/- heart failure. She has a history of PE which was in October of 2012 and had supratherapeutic INR with vaginal bleeding on coumadin. An IVC filter was placed. She now presents with worsening shortness of breath which is worse with exertion and has increased cough with productive sputum. A CXR was consistent with moderate CHF and her BNP is reported to be elevated (although 2 back to back measurements were 58 and 991?).  Cardiac markers have been negative x 3 sets. She has an elevated WBC count which is coming down. She is having ongoing anemia and her HCT today is 23, down from 28 yesterday. An echocardiogram was performed which shows normal systolic function and mild diastolic dysfunction with borderline elevated LV filling pressure. We are asked to consult regarding management of possible diastolic heart failure.  PMHx:  Past Medical History  Diagnosis Date  . GERD (gastroesophageal reflux disease)   . Hypertension   . Morbid obesity   . Arthritis   . Chronic kidney disease     nephrolithiasis left kidney,s/p open resection  . Status post arthroscopic surgery of right knee   . Chronic back pain   . Pneumonia 07/2011    hx  . Pulmonary embolism 07/2011    hx  . Uterine cancer     endometrial high grade adenocarcinoma  . Uterus cancer, sarcoma     high grade invasive carcinosarcoma  . Myocardial infarction 1989  . Bronchitis   . H/O cardiomegaly   . Lymphedema 09/29/2011  . Diabetes mellitus, diet controlled 10/03/2011  . Chronic kidney disease, stage  II (mild) 09/20/2011  . Acute diastolic congestive heart failure, 2D Oct 2012 09/29/2011  . Presence of IVC filter 01/08/2012  . History of colon polyps 01/08/2012  . Chest pain 01/16/2012  . Aortic stenosis, moderate 01/16/2012  . PAF (paroxysmal atrial fibrillation), 01/18/12 01/18/2012  . CAD (coronary artery disease) 03/14/2012  . Nephrolithiasis 03/14/2012  . Diverticulosis 03/14/2012  . Allergic rhinitis, cause unspecified 05/04/2012   Past Surgical History  Procedure Date  . Colonoscopy     multiple  . Knee arthroscopy     right knee  . Cataract extraction, bilateral   . Tubal ligation 1973  . Abdominal hysterectomy 10/05/2008    total with b/l salpingo-oopherectomy  . Kidney stone removal   . Open left knee cartilage repair   . Left index finger surgery     FAMHx: Family History  Problem Relation Age of Onset  . Breast cancer Sister     1st sister  . Colon cancer Sister     2nd sister  . Cervical cancer Sister     3rd sister    SOCHx:  reports that she has never smoked. She has never used smokeless tobacco. She reports that she does not drink alcohol or use illicit drugs.  ALLERGIES: No Known Allergies  ROS: A comprehensive review of systems was negative except for: Constitutional: positive for fatigue Cardiovascular: positive for dyspnea  HOME MEDICATIONS: Prescriptions prior to admission  Medication Sig Dispense  Refill  . albuterol (PROVENTIL HFA;VENTOLIN HFA) 108 (90 BASE) MCG/ACT inhaler Inhale 2 puffs into the lungs every 6 (six) hours as needed. For shortness of breath      . aspirin EC 81 MG tablet Take 81 mg by mouth daily.      . cholecalciferol (VITAMIN D) 1000 UNITS tablet Take 2,000 Units by mouth daily.       [name] docusate sodium (COLACE) 100 MG capsule Take 200 mg by mouth 2 (two) times daily. For diarrhea      . dronedarone (MULTAQ) 400 MG tablet Take 400 mg by mouth 2 (two) times daily with a meal.      . ezetimibe (ZETIA) 10 MG tablet Take 10 mg by mouth  daily.       [name] HYDROcodone-acetaminophen (NORCO) 10-325 MG per tablet Take 1 tablet by mouth every 6 (six) hours as needed. For pain      . levofloxacin (LEVAQUIN) 250 MG tablet Take 250 mg by mouth daily. For 10 days. Started 05/22/12      . metoprolol succinate (TOPROL-XL) 25 MG 24 hr tablet Take 25 mg by mouth daily.      . Nutritional Supplements (ENSURE PO) Take 1 each by mouth 2 (two) times daily as needed. For supplement      . omeprazole (PRILOSEC) 20 MG capsule Take 20 mg by mouth daily.      . polyethylene glycol (MIRALAX / GLYCOLAX) packet Take 17 g by mouth daily.      [name] senna (SENOKOT) 8.6 MG TABS Take 1 tablet by mouth.      . zolpidem (AMBIEN) 5 MG tablet Take 5-10 mg by mouth at bedtime as needed. For sleep.      . furosemide (LASIX) 40 MG tablet Take 40 mg by mouth daily.      . nitroGLYCERIN (NITROSTAT) 0.4 MG SL tablet Place 0.4 mg under the tongue every 5 (five) minutes as needed. For chest pain. Do not exceed 3 tablets. If pain is not resolved after 3rd tablet call 911.        HOSPITAL MEDICATIONS: Prior to Admission:  Prescriptions prior to admission  Medication Sig Dispense Refill  . albuterol (PROVENTIL HFA;VENTOLIN HFA) 108 (90 BASE) MCG/ACT inhaler Inhale 2 puffs into the lungs every 6 (six) hours as needed. For shortness of breath      . aspirin EC 81 MG tablet Take 81 mg by mouth daily.      . cholecalciferol (VITAMIN D) 1000 UNITS tablet Take 2,000 Units by mouth daily.       [name] docusate sodium (COLACE) 100 MG capsule Take 200 mg by mouth 2 (two) times daily. For diarrhea      . dronedarone (MULTAQ) 400 MG tablet Take 400 mg by mouth 2 (two) times daily with a meal.      . ezetimibe (ZETIA) 10 MG tablet Take 10 mg by mouth daily.       [name] HYDROcodone-acetaminophen (NORCO) 10-325 MG per tablet Take 1 tablet by mouth every 6 (six) hours as needed. For pain      . levofloxacin (LEVAQUIN) 250 MG tablet Take 250 mg by mouth daily. For 10 days. Started 05/22/12      .  metoprolol succinate (TOPROL-XL) 25 MG 24 hr tablet Take 25 mg by mouth daily.      . Nutritional Supplements (ENSURE PO) Take 1 each by mouth 2 (two) times daily as needed. For supplement      . omeprazole (PRILOSEC) 20  MG capsule Take 20 mg by mouth daily.      . polyethylene glycol (MIRALAX / GLYCOLAX) packet Take 17 g by mouth daily.      [name] senna (SENOKOT) 8.6 MG TABS Take 1 tablet by mouth.      . zolpidem (AMBIEN) 5 MG tablet Take 5-10 mg by mouth at bedtime as needed. For sleep.      . furosemide (LASIX) 40 MG tablet Take 40 mg by mouth daily.      . nitroGLYCERIN (NITROSTAT) 0.4 MG SL tablet Place 0.4 mg under the tongue every 5 (five) minutes as needed. For chest pain. Do not exceed 3 tablets. If pain is not resolved after 3rd tablet call 911.        VITALS: Blood pressure 95/54, pulse 81, temperature 97.5 F (36.4 C), temperature source Oral, resp. rate 20, height 5\' 6"  (1.676 m), weight 115.894 kg (255 lb 8 oz), SpO2 100.00%.  PHYSICAL EXAM: General appearance: alert, no distress and morbidly obese Neck: no adenopathy, no carotid bruit, no JVD, supple, symmetrical, trachea midline and thyroid not enlarged, symmetric, no tenderness/mass/nodules Lungs: rales bibasilarly, sound somewhat dry, worse on inspiration Heart: regular rate and rhythm, S1: normal, S2: decreased intensity and systolic murmur: mid-peaking systolic 3/6, crescendo at 2nd right intercostal space Abdomen: morbidly obese Extremities: significant bilateral LE limb enlargement, probably edema or lymphedema? Pulses: 1+ pulses Skin: Skin color, texture, turgor normal. No rashes or lesions Neurologic: Grossly normal  LABS: Results for orders placed during the hospital encounter of 05/30/12 (from the past 48 hour(s))  CBC     Status: Abnormal   Collection Time   05/30/12  1:55 AM      Component Value Range Comment   WBC 15.0 (*) 4.0 - 10.5 K/uL    RBC 2.75 (*) 3.87 - 5.11 MIL/uL    Hemoglobin 7.8 (*) 12.0 - 15.0  g/dL    HCT 16.1 (*) 09.6 - 46.0 %    MCV 90.2  78.0 - 100.0 fL    MCH 28.4  26.0 - 34.0 pg    MCHC 31.5  30.0 - 36.0 g/dL    RDW 04.5 (*) 40.9 - 15.5 %    Platelets 618 (*) 150 - 400 K/uL   PRO B NATRIURETIC PEPTIDE     Status: Abnormal   Collection Time   05/30/12  1:55 AM      Component Value Range Comment   Pro B Natriuretic peptide (BNP) 991.0 (*) 0 - 450 pg/mL   POCT I-STAT, CHEM 8     Status: Abnormal   Collection Time   05/30/12  2:02 AM      Component Value Range Comment   Sodium 137  135 - 145 mEq/L    Potassium 3.7  3.5 - 5.1 mEq/L    Chloride 99  96 - 112 mEq/L    BUN 42 (*) 6 - 23 mg/dL    Creatinine, Ser 8.11 (*) 0.50 - 1.10 mg/dL    Glucose, Bld 98  70 - 99 mg/dL    Calcium, Ion 9.14  7.82 - 1.30 mmol/L    TCO2 27  0 - 100 mmol/L    Hemoglobin 10.9 (*) 12.0 - 15.0 g/dL    HCT 95.6 (*) 21.3 - 46.0 %   POCT I-STAT TROPONIN I     Status: Normal   Collection Time   05/30/12  2:05 AM      Component Value Range Comment   Troponin i, poc 0.01  0.00 - 0.08 ng/mL    Comment 3            TYPE AND SCREEN     Status: Normal   Collection Time   05/30/12  3:00 AM      Component Value Range Comment   ABO/RH(D) O POS      Antibody Screen NEG      Sample Expiration 06/02/2012     CARDIAC PANEL(CRET KIN+CKTOT+MB+TROPI)     Status: Abnormal   Collection Time   05/30/12  5:30 AM      Component Value Range Comment   Total CK 208 (*) 7 - 177 U/L    CK, MB 3.4  0.3 - 4.0 ng/mL    Troponin I <0.30  <0.30 ng/mL    Relative Index 1.6  0.0 - 2.5   HEMOGLOBIN A1C     Status: Normal   Collection Time   05/30/12  5:30 AM      Component Value Range Comment   Hemoglobin A1C 5.4  <5.7 %    Mean Plasma Glucose 108  <117 mg/dL   COMPREHENSIVE METABOLIC PANEL     Status: Abnormal   Collection Time   05/30/12  5:30 AM      Component Value Range Comment   Sodium 135  135 - 145 mEq/L    Potassium 3.7  3.5 - 5.1 mEq/L    Chloride 95 (*) 96 - 112 mEq/L    CO2 29  19 - 32 mEq/L    Glucose,  Bld 93  70 - 99 mg/dL    BUN 42 (*) 6 - 23 mg/dL    Creatinine, Ser 6.21 (*) 0.50 - 1.10 mg/dL    Calcium 8.9  8.4 - 30.8 mg/dL    Total Protein 7.8  6.0 - 8.3 g/dL    Albumin 2.2 (*) 3.5 - 5.2 g/dL    AST 20  0 - 37 U/L    ALT 7  0 - 35 U/L    Alkaline Phosphatase 63  39 - 117 U/L    Total Bilirubin 0.2 (*) 0.3 - 1.2 mg/dL    GFR calc non Af Amer 17 (*) >90 mL/min    GFR calc Af Amer 20 (*) >90 mL/min   CBC     Status: Abnormal   Collection Time   05/30/12  5:30 AM      Component Value Range Comment   WBC 14.8 (*) 4.0 - 10.5 K/uL    RBC 3.11 (*) 3.87 - 5.11 MIL/uL    Hemoglobin 8.9 (*) 12.0 - 15.0 g/dL    HCT 65.7 (*) 84.6 - 46.0 %    MCV 90.4  78.0 - 100.0 fL    MCH 28.6  26.0 - 34.0 pg    MCHC 31.7  30.0 - 36.0 g/dL    RDW 96.2 (*) 95.2 - 15.5 %    Platelets 608 (*) 150 - 400 K/uL   DIFFERENTIAL     Status: Abnormal   Collection Time   05/30/12  5:30 AM      Component Value Range Comment   Neutrophils Relative 78 (*) 43 - 77 %    Neutro Abs 11.5 (*) 1.7 - 7.7 K/uL    Lymphocytes Relative 10 (*) 12 - 46 %    Lymphs Abs 1.5  0.7 - 4.0 K/uL    Monocytes Relative 11  3 - 12 %    Monocytes Absolute 1.6 (*) 0.1 - 1.0 K/uL    Eosinophils  Relative 1  0 - 5 %    Eosinophils Absolute 0.2  0.0 - 0.7 K/uL    Basophils Relative 0  0 - 1 %    Basophils Absolute 0.0  0.0 - 0.1 K/uL   TSH     Status: Normal   Collection Time   05/30/12  5:30 AM      Component Value Range Comment   TSH 3.202  0.350 - 4.500 uIU/mL   APTT     Status: Normal   Collection Time   05/30/12  5:30 AM      Component Value Range Comment   aPTT 36  24 - 37 seconds   PROTIME-INR     Status: Normal   Collection Time   05/30/12  5:30 AM      Component Value Range Comment   Prothrombin Time 14.7  11.6 - 15.2 seconds    INR 1.13  0.00 - 1.49   GLUCOSE, CAPILLARY     Status: Normal   Collection Time   05/30/12  8:04 AM      Component Value Range Comment   Glucose-Capillary 93  70 - 99 mg/dL   CARDIAC  PANEL(CRET KIN+CKTOT+MB+TROPI)     Status: Normal   Collection Time   05/30/12 11:47 AM      Component Value Range Comment   Total CK 175  7 - 177 U/L    CK, MB 3.0  0.3 - 4.0 ng/mL    Troponin I <0.30  <0.30 ng/mL    Relative Index 1.7  0.0 - 2.5   GLUCOSE, CAPILLARY     Status: Normal   Collection Time   05/30/12 11:53 AM      Component Value Range Comment   Glucose-Capillary 86  70 - 99 mg/dL   GLUCOSE, CAPILLARY     Status: Normal   Collection Time   05/30/12  4:48 PM      Component Value Range Comment   Glucose-Capillary 98  70 - 99 mg/dL   URINALYSIS, ROUTINE W REFLEX MICROSCOPIC     Status: Abnormal   Collection Time   05/30/12  7:43 PM      Component Value Range Comment   Color, Urine YELLOW  YELLOW    APPearance CLOUDY (*) CLEAR    Specific Gravity, Urine 1.014  1.005 - 1.030    pH 5.0  5.0 - 8.0    Glucose, UA NEGATIVE  NEGATIVE mg/dL    Hgb urine dipstick NEGATIVE  NEGATIVE    Bilirubin Urine NEGATIVE  NEGATIVE    Ketones, ur NEGATIVE  NEGATIVE mg/dL    Protein, ur NEGATIVE  NEGATIVE mg/dL    Urobilinogen, UA 0.2  0.0 - 1.0 mg/dL    Nitrite NEGATIVE  NEGATIVE    Leukocytes, UA NEGATIVE  NEGATIVE MICROSCOPIC NOT DONE ON URINES WITH NEGATIVE PROTEIN, BLOOD, LEUKOCYTES, NITRITE, OR GLUCOSE <1000 mg/dL.  CARDIAC PANEL(CRET KIN+CKTOT+MB+TROPI)     Status: Normal   Collection Time   05/30/12  8:13 PM      Component Value Range Comment   Total CK 165  7 - 177 U/L    CK, MB 2.8  0.3 - 4.0 ng/mL    Troponin I <0.30  <0.30 ng/mL    Relative Index 1.7  0.0 - 2.5   GLUCOSE, CAPILLARY     Status: Normal   Collection Time   05/30/12  9:04 PM      Component Value Range Comment   Glucose-Capillary 95  70 -  99 mg/dL    Comment 1 Notify RN     RETICULOCYTES     Status: Abnormal   Collection Time   05/31/12  5:39 AM      Component Value Range Comment   Retic Ct Pct 2.3  0.4 - 3.1 %    RBC. 2.63 (*) 3.87 - 5.11 MIL/uL    Retic Count, Manual 60.5  19.0 - 186.0 K/uL   BASIC  METABOLIC PANEL     Status: Abnormal   Collection Time   05/31/12  5:39 AM      Component Value Range Comment   Sodium 134 (*) 135 - 145 mEq/L    Potassium 3.7  3.5 - 5.1 mEq/L    Chloride 96  96 - 112 mEq/L    CO2 30  19 - 32 mEq/L    Glucose, Bld 81  70 - 99 mg/dL    BUN 40 (*) 6 - 23 mg/dL    Creatinine, Ser 6.57 (*) 0.50 - 1.10 mg/dL    Calcium 8.7  8.4 - 84.6 mg/dL    GFR calc non Af Amer 17 (*) >90 mL/min    GFR calc Af Amer 20 (*) >90 mL/min   CBC     Status: Abnormal   Collection Time   05/31/12  5:39 AM      Component Value Range Comment   WBC 11.5 (*) 4.0 - 10.5 K/uL    RBC 2.63 (*) 3.87 - 5.11 MIL/uL    Hemoglobin 7.5 (*) 12.0 - 15.0 g/dL REPEATED TO VERIFY   HCT 23.8 (*) 36.0 - 46.0 %    MCV 90.5  78.0 - 100.0 fL    MCH 28.1  26.0 - 34.0 pg    MCHC 31.1  30.0 - 36.0 g/dL    RDW 96.2 (*) 95.2 - 15.5 %    Platelets 494 (*) 150 - 400 K/uL   GLUCOSE, CAPILLARY     Status: Normal   Collection Time   05/31/12  7:44 AM      Component Value Range Comment   Glucose-Capillary 90  70 - 99 mg/dL     IMAGING: Dg Chest Portable 1 View  05/30/2012  *RADIOLOGY REPORT*  Clinical Data: Respiratory distress.  Short of breath.  PORTABLE CHEST - 1 VIEW  Comparison: 05/21/2012.  Findings: Moderate CHF is present with cardiomegaly, pulmonary vascular congestion and interstitial pulmonary edema with basilar predominant alveolar pulmonary edema.  IMPRESSION: Moderate CHF.  Original Report Authenticated By: Andreas Newport, M.D.    DIAGNOSES: Principal Problem:  *Acute diastolic congestive heart failure, 2D Oct 2012 Active Problems:  CKD (chronic kidney disease) stage 4, GFR 15-29 ml/min  Type II or unspecified type diabetes mellitus without mention of complication, uncontrolled  Acute on chronic renal failure  CAD (coronary artery disease)  Anemia, unspecified  Nausea and/or vomiting   IMPRESSION: 1. Diastolic dysfunction with mild acute diastolic heart failure 2. Pulmonary edema,  possibly secondary to infectious process or renal failure 3. Anemia - possible acute blood loss anemia 4. Moderate aortic stenosis  RECOMMENDATION: 1. She reports improvement in her breathing yesterday with diuretics, however, feels more breathless today - possibly related to anemia. She has diffuse crackles to mid-lung bilaterally, which sounds somewhat like dry crackles, but are most likely pulmonary edema (as seen on CXR).  I wonder if she has a concomitant viral pneumonia or atypical pneumonia with non-cardiogenic pulmonary edema as well.  Her echo does not show very high LV filling pressures as  would be associated with a purely diastolic heart failure event.  Nevertheless, I would continue to diurese her as her renal function seems to be improving. It would be helpful to involve nephrology to see what contribution they believe her kidneys may be playing in this.  I would also have a low threshold for transfusion with extra lasix to keep her HCT ~30.  She remains nauseated, which could be related to hepatic congestion or ?uremia - however, BUN is only 40 and albumin is very low at 2.2.    Thanks for the consult. We will follow along with you.  Time Spent Directly with Patient: 30 minutes  Chrystie Nose, MD, [facility] Attending Cardiologist [facility]  Alani Sabbagh C 05/31/2012, 11:36 AM

## 2012-05-31 NOTE — Assessment & Plan Note (Signed)
Mild situational, for klonopin prn

## 2012-06-01 ENCOUNTER — Inpatient Hospital Stay (HOSPITAL_COMMUNITY): Payer: Medicare Other

## 2012-06-01 DIAGNOSIS — R112 Nausea with vomiting, unspecified: Secondary | ICD-10-CM

## 2012-06-01 LAB — GLUCOSE, CAPILLARY
Glucose-Capillary: 86 mg/dL (ref 70–99)
Glucose-Capillary: 84 mg/dL (ref 70–99)

## 2012-06-01 LAB — FERRITIN: Ferritin: 798 ng/mL — ABNORMAL HIGH (ref 10–291)

## 2012-06-01 LAB — CBC
HCT: 22 % — ABNORMAL LOW (ref 36.0–46.0)
Hemoglobin: 6.9 g/dL — CL (ref 12.0–15.0)
MCH: 28.4 pg (ref 26.0–34.0)
MCHC: 31.4 g/dL (ref 30.0–36.0)
MCV: 90.5 fL (ref 78.0–100.0)

## 2012-06-01 LAB — RENAL FUNCTION PANEL
BUN: 41 mg/dL — ABNORMAL HIGH (ref 6–23)
Calcium: 8.5 mg/dL (ref 8.4–10.5)
Creatinine, Ser: 2.64 mg/dL — ABNORMAL HIGH (ref 0.50–1.10)
Glucose, Bld: 80 mg/dL (ref 70–99)
Phosphorus: 3.8 mg/dL (ref 2.3–4.6)

## 2012-06-01 LAB — HEMOGLOBIN AND HEMATOCRIT, BLOOD
HCT: 27.2 % — ABNORMAL LOW (ref 36.0–46.0)
Hemoglobin: 9.2 g/dL — ABNORMAL LOW (ref 12.0–15.0)

## 2012-06-01 LAB — VITAMIN B12: Vitamin B-12: 465 pg/mL (ref 211–911)

## 2012-06-01 MED ORDER — FUROSEMIDE 10 MG/ML IJ SOLN
20.0000 mg | Freq: Once | INTRAMUSCULAR | Status: AC
  Administered 2012-06-01: 20 mg via INTRAVENOUS
  Filled 2012-06-01: qty 2

## 2012-06-01 NOTE — Progress Notes (Addendum)
PCP: Oliver Barre, MD  Brief HPI: 78 yo female with CHF (diastolic), aortic stenosis (mild), P-afib, CKD stage 4, Anemia presents with c/o sob, worse with exertion. Had cough with yellow sputum, which changed to white (on levaquin). Denied fever, chills, cp, palp, n/v, diarrhea, brbpr, black stool. Pt presented to ED, pox 80's with walking, and CXR suggested moderate CHF. BNP elevated. Pt notes that her pcp discontinued her Lasix x 1 day due to worsening renal insufficiency. Pt was be admitted for CHF.   Past medical history:  Past Medical History  Diagnosis Date  . GERD (gastroesophageal reflux disease)   . Hypertension   . Morbid obesity   . Arthritis   . Chronic kidney disease     nephrolithiasis left kidney,s/p open resection  . Status post arthroscopic surgery of right knee   . Chronic back pain   . Pneumonia 07/2011    hx  . Pulmonary embolism 07/2011    hx  . Uterine cancer     endometrial high grade adenocarcinoma  . Uterus cancer, sarcoma     high grade invasive carcinosarcoma  . Myocardial infarction 1989  . Bronchitis   . H/O cardiomegaly   . Lymphedema 09/29/2011  . Diabetes mellitus, diet controlled 10/03/2011  . Chronic kidney disease, stage II (mild) 09/20/2011  . Acute diastolic congestive heart failure, 2D Oct 2012 09/29/2011  . Presence of IVC filter 01/08/2012  . History of colon polyps 01/08/2012  . Chest pain 01/16/2012  . Aortic stenosis, moderate 01/16/2012  . PAF (paroxysmal atrial fibrillation), 01/18/12 01/18/2012  . CAD (coronary artery disease) 03/14/2012  . Nephrolithiasis 03/14/2012  . Diverticulosis 03/14/2012  . Allergic rhinitis, cause unspecified 05/04/2012    Consultants: Cardiology [facility]), Nephrology  Procedures: None  Subjective: Patient states that she is breathing about the same. No chest pain. Coughing up white sputum. Still nauseas with vomiting at times. Denies abdominal pain.  Objective: Vital signs in last 24 hours: Temp:  [97.8 F  (36.6 C)-98 F (36.7 C)] 97.8 F (36.6 C) (08/18 0520) Pulse Rate:  [74-79] 74  (08/18 0520) Resp:  [17-20] 20  (08/18 0520) BP: (103-110)/(60-69) 110/69 mmHg (08/18 0520) SpO2:  [98 %-100 %] 100 % (08/18 0520) Weight:  [114.8 kg (253 lb 1.4 oz)] 114.8 kg (253 lb 1.4 oz) (08/18 0520) Weight change: -1.094 kg (-2 lb 6.6 oz) Last BM Date: 05/27/12  Intake/Output from previous day: 08/17 0701 - 08/18 0700 In: 600 [P.O.:600] Out: 550 [Urine:550] Intake/Output this shift: Total I/O In: -  Out: 275 [Urine:275]  General appearance: alert, cooperative, appears stated age, no distress and morbidly obese Resp: Improved aeration today. Less crackles compared to yesterday. No wheezing.  Cardio: regular rate and rhythm, S1, S2 normal. Systolic murmur over aortic area. No click, rub or gallop GI: soft, non-tender; bowel sounds normal; no masses,  no organomegaly Extremities: Significant lymphedema with pitting edema as well. Pulses: Difficult to palpate due to edema. Radial was present Neurologic: Alert and oriented x 3. No focal deficits.  Lab Results:  Basename 06/01/12 0510 05/31/12 0539  WBC 11.9* 11.5*  HGB 6.9* 7.5*  HCT 22.0* 23.8*  PLT 483* 494*   BMET  Basename 06/01/12 0510 05/31/12 0539 05/30/12 0530  NA 133* 134* --  K 3.6 3.7 --  CL 95* 96 --  CO2 27 30 --  GLUCOSE 80 81 --  BUN 41* 40* --  CREATININE 2.64* 2.52* --  CALCIUM 8.5 8.7 --  ALT -- -- 7  Studies/Results: No results found. 2D ECHOCARDIOGRAM Study Conclusions  - Left ventricle: The cavity size was normal. Wall thickness was normal. Systolic function was normal. The estimated ejection fraction was in the range of 55% to 60%. Wall motion was normal; there were no regional wall motion abnormalities. Doppler parameters are consistent with abnormal left ventricular relaxation (grade 1 diastolic dysfunction). Equivocal findings for elevated mean left atrial pressure. - Aortic valve: There was moderate  stenosis. Valve area: 1.37cm^2(VTI). Valve area: 1.25cm^2 (Vmax). Valve area: 1.22cm^2 (Vmean). - Mitral valve: Calcified annulus. Mildly thickened leaflets - Left atrium: The atrium was mildly dilated.  - Atrial septum: No defect or patent foramen ovale was identified. - Pulmonary arteries: Systolic pressure was mildly increased. PA peak pressure: 40mm Hg (S).    Medications:  Scheduled:    . antiseptic oral rinse  15 mL Mouth Rinse BID  . aspirin EC  81 mg Oral Daily  . cholecalciferol  2,000 Units Oral Daily  . docusate sodium  200 mg Oral BID  . dronedarone  400 mg Oral BID WC  . ezetimibe  10 mg Oral Daily  . furosemide  20 mg Intravenous Once  . furosemide  20 mg Intravenous Once  . furosemide  80 mg Intravenous BID  . insulin aspart  0-5 Units Subcutaneous QHS  . insulin aspart  0-9 Units Subcutaneous TID WC  . levofloxacin  250 mg Oral Q24 Hr x 2  . metoprolol succinate  25 mg Oral Daily  . pantoprazole  40 mg Oral Q1200  . polyethylene glycol  17 g Oral Daily  . sodium chloride  3 mL Intravenous Q12H  . sodium chloride  3 mL Intravenous Q12H   Continuous:  ZOX:WRUEAV chloride, albuterol, HYDROcodone-acetaminophen, nitroGLYCERIN, ondansetron (ZOFRAN) IV, senna, sodium chloride, zolpidem  Assessment/Plan:  Principal Problem:  *Acute diastolic congestive heart failure, 2D Oct 2012 Active Problems:  CKD (chronic kidney disease) stage 4, GFR 15-29 ml/min  Type II or unspecified type diabetes mellitus without mention of complication, uncontrolled  Acute on chronic renal failure  CAD (coronary artery disease)  Anemia, unspecified  Nausea and/or vomiting    Acute Diastolic CHF Slight improvement objectively. Conitnue current management. Cardiology has seen the patient and will assist with management. ECHO report is mentioned above. No ACEI due to renal dysfunction. Strict I/O. Daily weights. Monitor electrolytes. BP is borderline. Monitor closely.  Acute on Chronic  Renal Failure (Stage 4) Baseline creat appears to be between 1.5 and 2.0 though it has been greater than 2 at times. Creatinine has increased. Will involve nephrology. Underwent renal US earlier this year which showed chronic disease. Continue current management.   Anemia Will transfuse as this could be contributing to her dyspnea. Lasix with transfusion. Likely secondary to chronic disease. Anemia panel suggests chronic disease.  Nausea and Vomiting Etiology not clear. Will get US abdomen in AM. LFT's were normal. Could be from CHF. Abdomen is benign. Continue to monitor. UA was normal.  H/O CAD Stable. Troponin is negative. Had recent cath in April without significant obstruction.  Questionable Recent Pneumonia/Bronchitis Completed course of antibiotics  H/O Paroxysmal A Fib Stable. Continue Dronedarone. Not on anti-coagulation due to h/o GI bleed.  H/O PE Has IVC filter  H/O HTN Monitor. On home meds.  Code Status Full Code  DVT Prophylaxis SCD's    LOS: 2 days   [facility]  [facility] Pager [phone] 06/01/2012, 11:13 AM

## 2012-06-01 NOTE — Progress Notes (Signed)
Called lab value Hgb 6.9 in to on call MD Harduk. Harduk stated that she would let the rounding MD decide the next step in care. No orders given. Reported to the day shift RN. Will continue to monitor patient.

## 2012-06-01 NOTE — Progress Notes (Signed)
CRITICAL VALUE ALERT  Critical value received:  Hgb 6.9  Date of notification:  06/01/12  Time of notification:  0630  Critical value read back:yes  Nurse who received alert:  Haskel Khan RN  MD notified (1st page): Harduk  Time of first page:  [phone]  MD notified (2nd page):  Time of second page:  Responding MD:    Time MD responded:

## 2012-06-01 NOTE — Consult Note (Signed)
Christine [name] 06/01/2012 Jacky Dross D Requesting Physician:  Dr. Rito Ehrlich  Reason for Consult:  Acute on chronic renal failure HPI: The patient is a 78 y.o. year-old AAF admitted on 8/16 with SOB. Went to ED where found to have CHF and hypoxemia.  Daughter says she has been to the hospital 8-9 times since December for different reasons including PNA, CHF, bleeding from blood thinners.  Has hx of CKD.  Breathing a little better now. Getting IV lasix 20 mg with good UOP.  Denies CP, fevers. Has prod cough with yellow phlegm, then white. No diarrhea, +emesis x 2. +chronic "lymphedema" in both legs, swelling worse now than it used to be.  No change in urine volume or color. Takes lasix 40 mg bid usually, recently increased to 80 mg bid 2 weeks ago.   Denies heartburn. +"gagging", dry heaves for about 8 months along with very poor appetite. +constipation, chronic, thinks it comes from her pain medication, hydrocodone. Walks with a walker, no falls.  No hx CVA, +hx of light MI in 1989.  Had a heart cath recently in April by Dr. Rennis Golden, which showed no significant obstructive CAD and mild-mod aortic stenosis.  Hx of PE October 2012, but had vaginal bleeding in Jan and Feb 2013 and coumadin stopped and an IVC filter was placed.   Hx afib on multaq, no coumadin, see above. Hx of HTN for 25-30 years, and diet-controlled DM.   Reports pain in both hands, from the wrist down, for about 6  Months, excruciating at times. Had nerve studies, not sure of results.   ECHO- EF 55-60%, mod AS  Date   Creat  eGFR 2009  1.17  55 May 2012 1.64   Oct 2012 1.3-1.7  32-45 Dec 2012 1.30-1.42 40-45 Jan 2013 1.26-2.41 21-46 Feb 2013 1.89-2.45 21-10 February 2012 1.48-2.14 24-38 June   1.75  31 8/7  2.1   8/14  2.6 Admit 8/16 2.50  20 Today  2.64  19   UA- no protein, no blood, no micro done    Past Medical History:  Past Medical History  Diagnosis Date  . GERD (gastroesophageal reflux disease)   .  Hypertension   . Morbid obesity   . Arthritis   . Chronic kidney disease     nephrolithiasis left kidney,s/p open resection  . Status post arthroscopic surgery of right knee   . Chronic back pain   . Pneumonia 07/2011    hx  . Pulmonary embolism 07/2011    hx  . Uterine cancer     endometrial high grade adenocarcinoma  . Uterus cancer, sarcoma     high grade invasive carcinosarcoma  . Myocardial infarction 1989  . Bronchitis   . H/O cardiomegaly   . Lymphedema 09/29/2011  . Diabetes mellitus, diet controlled 10/03/2011  . Chronic kidney disease, stage II (mild) 09/20/2011  . Acute diastolic congestive heart failure, 2D Oct 2012 09/29/2011  . Presence of IVC filter 01/08/2012  . History of colon polyps 01/08/2012  . Chest pain 01/16/2012  . Aortic stenosis, moderate 01/16/2012  . PAF (paroxysmal atrial fibrillation), 01/18/12 01/18/2012  . CAD (coronary artery disease) 03/14/2012  . Nephrolithiasis 03/14/2012  . Diverticulosis 03/14/2012  . Allergic rhinitis, cause unspecified 05/04/2012    Past Surgical History:  Past Surgical History  Procedure Date  . Colonoscopy     multiple  . Knee arthroscopy     right knee  . Cataract extraction, bilateral   . Tubal ligation 1973  .  Abdominal hysterectomy 10/05/2008    total with b/l salpingo-oopherectomy  . Kidney stone removal   . Open left knee cartilage repair   . Left index finger surgery     Family History:  Family History  Problem Relation Age of Onset  . Breast cancer Sister     1st sister  . Colon cancer Sister     2nd sister  . Cervical cancer Sister     3rd sister   Social History:  reports that she has never smoked. She has never used smokeless tobacco. She reports that she does not drink alcohol or use illicit drugs.  Allergies: No Known Allergies  Home medications: Prior to Admission medications   Medication Sig Start Date End Date Taking? Authorizing Provider  albuterol (PROVENTIL HFA;VENTOLIN HFA) 108 (90 BASE)  MCG/ACT inhaler Inhale 2 puffs into the lungs every 6 (six) hours as needed. For shortness of breath   Yes Historical Provider, MD  aspirin EC 81 MG tablet Take 81 mg by mouth daily.   Yes Historical Provider, MD  cholecalciferol (VITAMIN D) 1000 UNITS tablet Take 2,000 Units by mouth daily.    Yes Historical Provider, MD  docusate sodium (COLACE) 100 MG capsule Take 200 mg by mouth 2 (two) times daily. For diarrhea   Yes Historical Provider, MD  dronedarone (MULTAQ) 400 MG tablet Take 400 mg by mouth 2 (two) times daily with a meal.   Yes Historical Provider, MD  ezetimibe (ZETIA) 10 MG tablet Take 10 mg by mouth daily.    Yes Historical Provider, MD  HYDROcodone-acetaminophen (NORCO) 10-325 MG per tablet Take 1 tablet by mouth every 6 (six) hours as needed. For pain   Yes Historical Provider, MD  levofloxacin (LEVAQUIN) 250 MG tablet Take 250 mg by mouth daily. For 10 days. Started 05/22/12   Yes Historical Provider, MD  metoprolol succinate (TOPROL-XL) 25 MG 24 hr tablet Take 25 mg by mouth daily.   Yes Historical Provider, MD  Nutritional Supplements (ENSURE PO) Take 1 each by mouth 2 (two) times daily as needed. For supplement   Yes Historical Provider, MD  omeprazole (PRILOSEC) 20 MG capsule Take 20 mg by mouth daily.   Yes Historical Provider, MD  polyethylene glycol (MIRALAX / GLYCOLAX) packet Take 17 g by mouth daily.   Yes Historical Provider, MD  senna (SENOKOT) 8.6 MG TABS Take 1 tablet by mouth.   Yes Historical Provider, MD  zolpidem (AMBIEN) 5 MG tablet Take 5-10 mg by mouth at bedtime as needed. For sleep.   Yes Historical Provider, MD  furosemide (LASIX) 40 MG tablet Take 40 mg by mouth daily.    Historical Provider, MD  nitroGLYCERIN (NITROSTAT) 0.4 MG SL tablet Place 0.4 mg under the tongue every 5 (five) minutes as needed. For chest pain. Do not exceed 3 tablets. If pain is not resolved after 3rd tablet call 911.    Historical Provider, MD    Inpatient medications:    .  antiseptic oral rinse  15 mL Mouth Rinse BID  . aspirin EC  81 mg Oral Daily  . cholecalciferol  2,000 Units Oral Daily  . docusate sodium  200 mg Oral BID  . dronedarone  400 mg Oral BID WC  . ezetimibe  10 mg Oral Daily  . furosemide  20 mg Intravenous Once  . furosemide  20 mg Intravenous Once  . furosemide  80 mg Intravenous BID  . insulin aspart  0-5 Units Subcutaneous QHS  . insulin aspart  0-9 Units Subcutaneous TID WC  . metoprolol succinate  25 mg Oral Daily  . pantoprazole  40 mg Oral Q1200  . polyethylene glycol  17 g Oral Daily  . sodium chloride  3 mL Intravenous Q12H  . sodium chloride  3 mL Intravenous Q12H    Review of Systems  Gen:  Denies headache, fever, chills, sweats. +weight loss 40 lbs this year. HEENT:  No visual change, sore throat, difficulty swallowing. Resp:  See above No cough or hemoptysis. Cardiac:  No chest pain. GI:   See above GU:  Denies difficulty or change in voiding.  No change in urine color.     MS:  Denies joint pain or swelling.   Derm:  Denies skin rash or itching.  No chronic skin conditions.  Neuro:   Denies focal weakness, memory problems, hx stroke or TIA.   Psych:  Denies symptoms of depression of anxiety.  No hallucination.    Labs: Basic Metabolic Panel:  Lab 06/01/12 7829 05/31/12 0539 05/30/12 0530 05/30/12 0202 05/28/12 1515  NA 133* 134* 135 137 135  K 3.6 3.7 3.7 3.7 3.7  CL 95* 96 95* 99 96  CO2 27 30 29  -- 29  GLUCOSE 80 81 93 98 94  BUN 41* 40* 42* 42* 40*  CREATININE 2.64* 2.52* 2.56* 2.50* 2.6*  ALB -- -- -- -- --  CALCIUM 8.5 8.7 8.9 -- 8.9  PHOS 3.8 -- -- -- --   Liver Function Tests:  Lab 06/01/12 0510 05/30/12 0530  AST -- 20  ALT -- 7  ALKPHOS -- 63  BILITOT -- 0.2*  PROT -- 7.8  ALBUMIN 1.8* 2.2*   No results found for this basename: LIPASE:3,AMYLASE:3 in the last 168 hours No results found for this basename: AMMONIA:3 in the last 168 hours CBC:  Lab 06/01/12 0510 05/31/12 0539 05/30/12 0530  05/30/12 0202 05/30/12 0155 05/28/12 1515  WBC 11.9* 11.5* 14.8* -- 15.0* --  NEUTROABS -- -- 11.5* -- -- 10.4*  HGB 6.9* 7.5* 8.9* 10.9* -- --  HCT 22.0* 23.8* 28.1* 32.0* -- --  MCV 90.5 90.5 90.4 -- 90.2 --  PLT 483* 494* 608* -- 618* --   PT/INR: @labrcntip (inr:5) Cardiac Enzymes:  Lab 05/30/12 2013 05/30/12 1147 05/30/12 0530  CKTOTAL 165 175 208*  CKMB 2.8 3.0 3.4  CKMBINDEX -- -- --  TROPONINI <0.30 <0.30 <0.30   CBG:  Lab 06/01/12 1159 06/01/12 0753 05/31/12 2141 05/31/12 1641 05/31/12 1210  GLUCAP 84 86 83 97 95    Iron Studies:  Lab 05/31/12 0539  IRON 27*  TIBC 115*  TRANSFERRIN --  FERRITIN 798*    Xrays/Other Studies: No results found.  Physical Exam:  Blood pressure 102/66, pulse 69, temperature 98.1 F (36.7 C), temperature source Oral, resp. rate 18, height 5\' 6"  (1.676 m), weight 114.8 kg (253 lb 1.4 oz), SpO2 100.00%.  Gen: alert, obese, somewhat frail appearing, has some muscle wasting in the face, no distress Skin: no rash, cyanosis HEENT:  EOMI, sclera anicteric, throat clear Neck: no JVD, no bruits or LAN Chest: bibasilar crackles 1/4 up bilat Heart: regular, no rub or gallop, harsh 2/6 SEM loudest at LLSB Abdomen: soft, very obese, no ascites or masses, nondistended, + BS Ext: 2+pitting edema of lower legs and feet, chronic brawny skin changes bilat, no ulceration. Hands examined, no joint swelling, distal fingers are uniformly lighter in skin tone in all fingers, radial pulses are good. Don't see any scleroderma like changes Neuro: alert,  Ox3, no focal deficit, no asterixis  Date   Creat  eGFR 2009  1.17  55 May 2012 1.64   Oct 2012 1.3-1.7  32-45 Dec 2012 1.30-1.42 40-45 Jan 2013 1.26-2.41 21-46 Feb 2013 1.89-2.45 21-10 February 2012 1.48-2.14 24-38 June   1.75  31 8/7  2.1   8/14  2.6 Admit 8/16 2.50  20 Today  2.64  19   UA- normal ECHO- normal EF, diast dysfunction I Renal US Jan 2013- 9cm R, 10.9 cm L.  Diffuse cortical  thinning and increased cortical echogenicity bilat c/w chronic disease.   Impression/Plan 1. CKD stage IV- do not think this is ARF, looks more like progression of CKD.  She has lost a lot of renal function in the last few years (see above). The current estimated GFR is only 20 mL/min.  The Jan US shows advanced changes with smallish, echogenic kidneys with cortical thinning. UA is normal. All this is c/w patient's history of 30+ years of HTN, i.e. Hypertensive nephrosclerosis.  Will get myeloma screens. She is vol overloaded with mild-mod pulm edema and agree with diuresis. She is having significant problems with anorexia, persistent nausea and weight loss with nutritional decline it appears.  Not sure the cause, but will do 24 hour urea and creat clearance to see if the current estimates of GFR may be too high.  She is a borderline candidate for chronic dialysis; do not expect she would do well given her debility and comorbidities. Have not discussed at length with patient yet. She also is having bilateral hand pain which is severe at times. May need further looking into, ? Neuropathic pain, or RSD, Raynaud's possibly. ? paraneoplastic  2. HTN 30+ years 3. Vol excess / pulm edema - agree w diuresis 4. Hand pain, bilat- ? Cause 5. Chronic nausea, anorexia, wt loss- ? Cancer evaluation. check24 hour urine for creat clearance 6. Morbid obesity 7. Hx uterine cancer 8. Hx PE, s/p filter and off coumadin due to bleeding (vag bleeding earlier this year)   Vinson Moselle  MD [facility] [phone] pgr    [phone] cell 06/01/2012, 2:56 PM

## 2012-06-01 NOTE — Progress Notes (Signed)
[facility]  DAILY PROGRESS NOTE   Subjective:  Still remains dyspneic. H/H lower today. Plan for transfusion. I/O's nearly even yesterday.  Objective:  Temp:  [97.5 F (36.4 C)-98 F (36.7 C)] 97.5 F (36.4 C) (08/18 1205) Pulse Rate:  [73-79] 73  (08/18 1205) Resp:  [17-20] 20  (08/18 1205) BP: (103-120)/(60-73) 120/73 mmHg (08/18 1205) SpO2:  [98 %-100 %] 100 % (08/18 0520) Weight:  [114.8 kg (253 lb 1.4 oz)] 114.8 kg (253 lb 1.4 oz) (08/18 0520) Weight change: -1.094 kg (-2 lb 6.6 oz)  Intake/Output from previous day: 08/17 0701 - 08/18 0700 In: 600 [P.O.:600] Out: 550 [Urine:550]  Intake/Output from this shift: Total I/O In: 12.5 [Blood:12.5] Out: 275 [Urine:275]  Medications: Current Facility-Administered Medications  Medication Dose Route Frequency Provider Last Rate Last Dose  . 0.9 %  sodium chloride infusion  250 mL Intravenous PRN Massie Maroon, MD      . albuterol (PROVENTIL HFA;VENTOLIN HFA) 108 (90 BASE) MCG/ACT inhaler 2 puff  2 puff Inhalation Q6H PRN Massie Maroon, MD      . antiseptic oral rinse (BIOTENE) solution 15 mL  15 mL Mouth Rinse BID Massie Maroon, MD   15 mL at 06/01/12 0800  . aspirin EC tablet 81 mg  81 mg Oral Daily Massie Maroon, MD   81 mg at 06/01/12 1015  . cholecalciferol (VITAMIN D) tablet 2,000 Units  2,000 Units Oral Daily Massie Maroon, MD   2,000 Units at 06/01/12 1016  . docusate sodium (COLACE) capsule 200 mg  200 mg Oral BID Massie Maroon, MD   200 mg at 06/01/12 1015  . dronedarone (MULTAQ) tablet 400 mg  400 mg Oral BID WC Massie Maroon, MD   400 mg at 06/01/12 0817  . ezetimibe (ZETIA) tablet 10 mg  10 mg Oral Daily Massie Maroon, MD   10 mg at 06/01/12 1016  . furosemide (LASIX) injection 20 mg  20 mg Intravenous Once Osvaldo Shipper, MD      . furosemide (LASIX) injection 20 mg  20 mg Intravenous Once Osvaldo Shipper, MD      . furosemide (LASIX) injection 80 mg  80 mg Intravenous BID Osvaldo Shipper, MD    80 mg at 06/01/12 0818  . HYDROcodone-acetaminophen (NORCO) 10-325 MG per tablet 1 tablet  1 tablet Oral Q6H PRN Massie Maroon, MD   1 tablet at 06/01/12 1221  . insulin aspart (novoLOG) injection 0-5 Units  0-5 Units Subcutaneous QHS Massie Maroon, MD      . insulin aspart (novoLOG) injection 0-9 Units  0-9 Units Subcutaneous TID WC Massie Maroon, MD      . levofloxacin [facility]) tablet 250 mg  250 mg Oral Q24 Hr x 2 Massie Maroon, MD   250 mg at 05/31/12 1313  . metoprolol succinate (TOPROL-XL) 24 hr tablet 25 mg  25 mg Oral Daily Massie Maroon, MD   25 mg at 06/01/12 1015  . nitroGLYCERIN (NITROSTAT) SL tablet 0.4 mg  0.4 mg Sublingual Q5 min PRN Massie Maroon, MD      . ondansetron [facility]) injection 4 mg  4 mg Intravenous Q6H PRN Osvaldo Shipper, MD   4 mg at 05/31/12 0809  . pantoprazole (PROTONIX) EC tablet 40 mg  40 mg Oral Q1200 Massie Maroon, MD   40 mg at 06/01/12 1221  . polyethylene glycol (MIRALAX / GLYCOLAX) packet 17 g  17  g Oral Daily Massie Maroon, MD   17 g at 06/01/12 1016  . senna (SENOKOT) tablet 8.6 mg  1 tablet Oral Daily PRN Massie Maroon, MD   8.6 mg at 06/01/12 1016  . sodium chloride 0.9 % injection 3 mL  3 mL Intravenous Q12H Massie Maroon, MD   3 mL at 06/01/12 1016  . sodium chloride 0.9 % injection 3 mL  3 mL Intravenous Q12H Massie Maroon, MD   3 mL at 06/01/12 1016  . sodium chloride 0.9 % injection 3 mL  3 mL Intravenous PRN Massie Maroon, MD      . zolpidem Remus Loffler) tablet 5-10 mg  5-10 mg Oral QHS PRN Massie Maroon, MD        Physical Exam: General appearance: alert and no distress Neck: no adenopathy, no carotid bruit, no JVD, supple, symmetrical, trachea midline and thyroid not enlarged, symmetric, no tenderness/mass/nodules Lungs: clear to auscultation bilaterally Heart: regular rate and rhythm, S1, S2 normal, no murmur, click, rub or gallop Abdomen: soft, non-tender; bowel sounds normal; no masses,  no organomegaly Extremities: extremities normal, atraumatic, no  cyanosis or edema Pulses: 2+ and symmetric  Lab Results: Results for orders placed during the hospital encounter of 05/30/12 (from the past 48 hour(s))  GLUCOSE, CAPILLARY     Status: Normal   Collection Time   05/30/12  4:48 PM      Component Value Range Comment   Glucose-Capillary 98  70 - 99 mg/dL   URINALYSIS, ROUTINE W REFLEX MICROSCOPIC     Status: Abnormal   Collection Time   05/30/12  7:43 PM      Component Value Range Comment   Color, Urine YELLOW  YELLOW    APPearance CLOUDY (*) CLEAR    Specific Gravity, Urine 1.014  1.005 - 1.030    pH 5.0  5.0 - 8.0    Glucose, UA NEGATIVE  NEGATIVE mg/dL    Hgb urine dipstick NEGATIVE  NEGATIVE    Bilirubin Urine NEGATIVE  NEGATIVE    Ketones, ur NEGATIVE  NEGATIVE mg/dL    Protein, ur NEGATIVE  NEGATIVE mg/dL    Urobilinogen, UA 0.2  0.0 - 1.0 mg/dL    Nitrite NEGATIVE  NEGATIVE    Leukocytes, UA NEGATIVE  NEGATIVE MICROSCOPIC NOT DONE ON URINES WITH NEGATIVE PROTEIN, BLOOD, LEUKOCYTES, NITRITE, OR GLUCOSE <1000 mg/dL.  CARDIAC PANEL(CRET KIN+CKTOT+MB+TROPI)     Status: Normal   Collection Time   05/30/12  8:13 PM      Component Value Range Comment   Total CK 165  7 - 177 U/L    CK, MB 2.8  0.3 - 4.0 ng/mL    Troponin I <0.30  <0.30 ng/mL    Relative Index 1.7  0.0 - 2.5   GLUCOSE, CAPILLARY     Status: Normal   Collection Time   05/30/12  9:04 PM      Component Value Range Comment   Glucose-Capillary 95  70 - 99 mg/dL    Comment 1 Notify RN     VITAMIN B12     Status: Normal   Collection Time   05/31/12  5:39 AM      Component Value Range Comment   Vitamin B-12 465  211 - 911 pg/mL   FOLATE     Status: Normal   Collection Time   05/31/12  5:39 AM      Component Value Range Comment   Folate 5.4  IRON AND TIBC     Status: Abnormal   Collection Time   05/31/12  5:39 AM      Component Value Range Comment   Iron 27 (*) 42 - 135 ug/dL    TIBC 161 (*) 096 - 045 ug/dL    Saturation Ratios 23  20 - 55 %    UIBC 88 (*) 125 -  400 ug/dL   FERRITIN     Status: Abnormal   Collection Time   05/31/12  5:39 AM      Component Value Range Comment   Ferritin 798 (*) 10 - 291 ng/mL   RETICULOCYTES     Status: Abnormal   Collection Time   05/31/12  5:39 AM      Component Value Range Comment   Retic Ct Pct 2.3  0.4 - 3.1 %    RBC. 2.63 (*) 3.87 - 5.11 MIL/uL    Retic Count, Manual 60.5  19.0 - 186.0 K/uL   BASIC METABOLIC PANEL     Status: Abnormal   Collection Time   05/31/12  5:39 AM      Component Value Range Comment   Sodium 134 (*) 135 - 145 mEq/L    Potassium 3.7  3.5 - 5.1 mEq/L    Chloride 96  96 - 112 mEq/L    CO2 30  19 - 32 mEq/L    Glucose, Bld 81  70 - 99 mg/dL    BUN 40 (*) 6 - 23 mg/dL    Creatinine, Ser 4.09 (*) 0.50 - 1.10 mg/dL    Calcium 8.7  8.4 - 81.1 mg/dL    GFR calc non Af Amer 17 (*) >90 mL/min    GFR calc Af Amer 20 (*) >90 mL/min   CBC     Status: Abnormal   Collection Time   05/31/12  5:39 AM      Component Value Range Comment   WBC 11.5 (*) 4.0 - 10.5 K/uL    RBC 2.63 (*) 3.87 - 5.11 MIL/uL    Hemoglobin 7.5 (*) 12.0 - 15.0 g/dL REPEATED TO VERIFY   HCT 23.8 (*) 36.0 - 46.0 %    MCV 90.5  78.0 - 100.0 fL    MCH 28.1  26.0 - 34.0 pg    MCHC 31.1  30.0 - 36.0 g/dL    RDW 91.4 (*) 78.2 - 15.5 %    Platelets 494 (*) 150 - 400 K/uL   GLUCOSE, CAPILLARY     Status: Normal   Collection Time   05/31/12  7:44 AM      Component Value Range Comment   Glucose-Capillary 90  70 - 99 mg/dL   GLUCOSE, CAPILLARY     Status: Normal   Collection Time   05/31/12 12:10 PM      Component Value Range Comment   Glucose-Capillary 95  70 - 99 mg/dL   GLUCOSE, CAPILLARY     Status: Normal   Collection Time   05/31/12  4:41 PM      Component Value Range Comment   Glucose-Capillary 97  70 - 99 mg/dL   GLUCOSE, CAPILLARY     Status: Normal   Collection Time   05/31/12  9:41 PM      Component Value Range Comment   Glucose-Capillary 83  70 - 99 mg/dL    Comment 1 Notify RN     CBC     Status:  Abnormal   Collection Time   06/01/12  5:10 AM  Component Value Range Comment   WBC 11.9 (*) 4.0 - 10.5 K/uL    RBC 2.43 (*) 3.87 - 5.11 MIL/uL    Hemoglobin 6.9 (*) 12.0 - 15.0 g/dL    HCT 16.1 (*) 09.6 - 46.0 %    MCV 90.5  78.0 - 100.0 fL    MCH 28.4  26.0 - 34.0 pg    MCHC 31.4  30.0 - 36.0 g/dL    RDW 04.5 (*) 40.9 - 15.5 %    Platelets 483 (*) 150 - 400 K/uL   RENAL FUNCTION PANEL     Status: Abnormal   Collection Time   06/01/12  5:10 AM      Component Value Range Comment   Sodium 133 (*) 135 - 145 mEq/L    Potassium 3.6  3.5 - 5.1 mEq/L    Chloride 95 (*) 96 - 112 mEq/L    CO2 27  19 - 32 mEq/L    Glucose, Bld 80  70 - 99 mg/dL    BUN 41 (*) 6 - 23 mg/dL    Creatinine, Ser 8.11 (*) 0.50 - 1.10 mg/dL    Calcium 8.5  8.4 - 91.4 mg/dL    Phosphorus 3.8  2.3 - 4.6 mg/dL    Albumin 1.8 (*) 3.5 - 5.2 g/dL    GFR calc non Af Amer 16 (*) >90 mL/min    GFR calc Af Amer 19 (*) >90 mL/min   GLUCOSE, CAPILLARY     Status: Normal   Collection Time   06/01/12  7:53 AM      Component Value Range Comment   Glucose-Capillary 86  70 - 99 mg/dL     Imaging: No results found.  Assessment:  1. Principal Problem: 2.  *Acute diastolic congestive heart failure, 2D Oct 2012 3. Active Problems: 4.  CKD (chronic kidney disease) stage 4, GFR 15-29 ml/min 5.  Type II or unspecified type diabetes mellitus without mention of complication, uncontrolled 6.  Acute on chronic renal failure 7.  CAD (coronary artery disease) 8.  Anemia, unspecified 9.  Nausea and/or vomiting 10.   Plan:  1. Agree with transfusion and lasix between and after doses. I think this will help her breathlessness the most at this point. I's and O's were nearly matched. Urine output has been poor, due to advanced kidney disease. It would be beneficial to get nephrology's opinion on this.  I still feel that she is not in significantly decompensated diastolic heart failure at this point.   Time Spent Directly with  Patient:  15 minutes  Length of Stay:  LOS: 2 days   Chrystie Nose, MD, [facility] Attending Cardiologist [facility]  Estee Yohe C 06/01/2012, 12:25 PM

## 2012-06-02 ENCOUNTER — Inpatient Hospital Stay (HOSPITAL_COMMUNITY): Payer: Medicare Other

## 2012-06-02 LAB — CBC
Hemoglobin: 9.1 g/dL — ABNORMAL LOW (ref 12.0–15.0)
MCH: 28.6 pg (ref 26.0–34.0)
MCHC: 32.4 g/dL (ref 30.0–36.0)
MCV: 88.4 fL (ref 78.0–100.0)
RBC: 3.18 MIL/uL — ABNORMAL LOW (ref 3.87–5.11)

## 2012-06-02 LAB — TYPE AND SCREEN
Antibody Screen: NEGATIVE
Unit division: 0

## 2012-06-02 LAB — COMPREHENSIVE METABOLIC PANEL
ALT: 6 U/L (ref 0–35)
BUN: 42 mg/dL — ABNORMAL HIGH (ref 6–23)
CO2: 31 mEq/L (ref 19–32)
Calcium: 8.8 mg/dL (ref 8.4–10.5)
Creatinine, Ser: 2.6 mg/dL — ABNORMAL HIGH (ref 0.50–1.10)
GFR calc Af Amer: 19 mL/min — ABNORMAL LOW (ref >90)
GFR calc non Af Amer: 17 mL/min — ABNORMAL LOW (ref >90)
Glucose, Bld: 81 mg/dL (ref 70–99)
Sodium: 134 mEq/L — ABNORMAL LOW (ref 135–145)
Total Protein: 6.7 g/dL (ref 6.0–8.3)

## 2012-06-02 LAB — HEPATITIS C ANTIBODY (REFLEX): HCV Ab: NEGATIVE

## 2012-06-02 LAB — HEPATITIS B SURFACE ANTIGEN: Hepatitis B Surface Ag: NEGATIVE

## 2012-06-02 LAB — GLUCOSE, CAPILLARY
Glucose-Capillary: 92 mg/dL (ref 70–99)
Glucose-Capillary: 107 mg/dL — ABNORMAL HIGH (ref 70–99)

## 2012-06-02 MED ORDER — DARBEPOETIN ALFA-POLYSORBATE 100 MCG/0.5ML IJ SOLN
100.0000 ug | Freq: Once | INTRAMUSCULAR | Status: AC
  Administered 2012-06-02: 100 ug via SUBCUTANEOUS
  Filled 2012-06-02: qty 0.5

## 2012-06-02 MED ORDER — FERUMOXYTOL INJECTION 510 MG/17 ML
510.0000 mg | Freq: Once | INTRAVENOUS | Status: AC
  Administered 2012-06-02: 510 mg via INTRAVENOUS
  Filled 2012-06-02: qty 17

## 2012-06-02 MED ORDER — FUROSEMIDE 80 MG PO TABS
80.0000 mg | ORAL_TABLET | Freq: Two times a day (BID) | ORAL | Status: DC
  Administered 2012-06-02 – 2012-06-03 (×3): 80 mg via ORAL
  Filled 2012-06-02 (×4): qty 1

## 2012-06-02 NOTE — Progress Notes (Signed)
Occupational Therapy Treatment Patient Details Name: Christine [name] MRN: [id number] DOB: 07-Jun-1931 Today's Date: 06/02/2012 Time: 1050-1102 OT Time Calculation (min): 12 min  OT Assessment / Plan / Recommendation Comments on Treatment Session Pt with decreased activity tolerance today. Only able to tolerate up to [facility] and then to chair. She states she feels SOB. On 2L she remained at 100% throughout session however.     Follow Up Recommendations  No OT follow up;Other (comment) (likely; home health aide)    Barriers to Discharge       Equipment Recommendations  None recommended by PT;None recommended by OT    Recommendations for Other Services    Frequency Min 2X/week   Plan Discharge plan remains appropriate    Precautions / Restrictions Precautions Precautions: None Restrictions Weight Bearing Restrictions: No        ADL  Toilet Transfer: Performed;Min guard Toilet Transfer Method: Stand pivot Toilet Transfer Equipment: Bedside commode Toileting - Clothing Manipulation and Hygiene: Simulated;Min guard Where Assessed - Toileting Clothing Manipulation and Hygiene: Standing ADL Comments: No device for bed to BSC only. Then transferred to the chair to sit up. Pt feeling SOB and states not able to ambulate today--requests to [facility] and chair only. O2 sats 100% on 2L throughout task.     OT Diagnosis:    OT Problem List:   OT Treatment Interventions:     OT Goals ADL Goals ADL Goal: Toilet Transfer - Progress: Progressing toward goals ADL Goal: Toileting - Hygiene - Progress: Progressing toward goals  Visit Information  Last OT Received On: 06/02/12 Assistance Needed: +1 PT/OT Co-Evaluation/Treatment: Yes    Subjective Data  Subjective: i feel short of breath Patient Stated Goal: agreeable up to commode and then chair. none stated   Prior Functioning       Cognition  Overall Cognitive Status: Appears within functional limits for tasks  assessed/performed Arousal/Alertness: Awake/alert Orientation Level: Appears intact for tasks assessed Behavior During Session: [facility] for tasks performed    Mobility Bed Mobility Supine to Sit: 4: Min assist;HOB elevated Transfers Sit to Stand: 4: Min guard;From bed;With upper extremity assist;With armrests Stand to Sit: 4: Min guard;To bed;With upper extremity assist;With armrests;To chair/3-in-1 Details for Transfer Assistance: SPT from bed to [facility] then [facility] to recliner using armrests, Min/guard assist for safety   Exercises    Balance    End of Session OT - End of Session Activity Tolerance: Patient limited by fatigue Patient left: in chair;with call bell/phone within reach  GO     Lennox Laity [phone] 06/02/2012, 11:55 AM

## 2012-06-02 NOTE — Progress Notes (Signed)
PCP: Oliver Barre, MD  Brief HPI: 78 yo female with CHF (diastolic), aortic stenosis (mild), P-afib, CKD stage 4, Anemia presents with c/o sob, worse with exertion. Had cough with yellow sputum, which changed to white (on levaquin). Denied fever, chills, cp, palp, n/v, diarrhea, brbpr, black stool. Pt presented to ED, pox 80's with walking, and CXR suggested moderate CHF. BNP elevated. Pt notes that her pcp discontinued her Lasix x 1 day due to worsening renal insufficiency. Pt was be admitted for CHF.   Past medical history:  Past Medical History  Diagnosis Date  . GERD (gastroesophageal reflux disease)   . Hypertension   . Morbid obesity   . Arthritis   . Chronic kidney disease     nephrolithiasis left kidney,s/p open resection  . Status post arthroscopic surgery of right knee   . Chronic back pain   . Pneumonia 07/2011    hx  . Pulmonary embolism 07/2011    hx  . Uterine cancer     endometrial high grade adenocarcinoma  . Uterus cancer, sarcoma     high grade invasive carcinosarcoma  . Myocardial infarction 1989  . Bronchitis   . H/O cardiomegaly   . Lymphedema 09/29/2011  . Diabetes mellitus, diet controlled 10/03/2011  . Chronic kidney disease, stage II (mild) 09/20/2011  . Acute diastolic congestive heart failure, 2D Oct 2012 09/29/2011  . Presence of IVC filter 01/08/2012  . History of colon polyps 01/08/2012  . Chest pain 01/16/2012  . Aortic stenosis, moderate 01/16/2012  . PAF (paroxysmal atrial fibrillation), 01/18/12 01/18/2012  . CAD (coronary artery disease) 03/14/2012  . Nephrolithiasis 03/14/2012  . Diverticulosis 03/14/2012  . Allergic rhinitis, cause unspecified 05/04/2012    Consultants: Cardiology [facility]), Nephrology  Procedures: None  Subjective: Patient states she is feeling better. No pain. States she finds it difficult to take a breath when she swallows food. But denies choking or coughing with food or fluids.  Objective: Vital signs in last 24 hours: Temp:   [97.4 F (36.3 C)-98.8 F (37.1 C)] 98.2 F (36.8 C) (08/19 0534) Pulse Rate:  [68-98] 68  (08/19 0534) Resp:  [18-20] 20  (08/19 0534) BP: (91-139)/(49-79) 100/60 mmHg (08/19 0534) SpO2:  [100 %] 100 % (08/19 1113) Weight:  [114.8 kg (253 lb 1.4 oz)] 114.8 kg (253 lb 1.4 oz) (08/19 0534) Weight change: 0 kg (0 lb) Last BM Date: 05/27/12  Intake/Output from previous day: 08/18 0701 - 08/19 0700 In: 751.5 [P.O.:120; Blood:631.5] Out: 2175 [Urine:2175] Intake/Output this shift: Total I/O In: -  Out: 600 [Urine:600]  General appearance: alert, cooperative, appears stated age, no distress and morbidly obese Resp: Continues to improve wrt aeration. Crackles are less. No wheezing.  Cardio: regular rate and rhythm, S1, S2 normal. Systolic murmur over aortic area. No click, rub or gallop GI: soft, non-tender; bowel sounds normal; no masses,  no organomegaly Extremities: Significant lymphedema with pitting edema as well. Pulses: Difficult to palpate due to edema. Radial was present Neurologic: Alert and oriented x 3. No focal deficits.  Lab Results:  Basename 06/02/12 0450 06/01/12 2158 06/01/12 0510  WBC 15.0* -- 11.9*  HGB 9.1* 9.2* --  HCT 28.1* 27.2* --  PLT 494* -- 483*   BMET  Basename 06/02/12 0450 06/01/12 0510  NA 134* 133*  K 3.6 3.6  CL 94* 95*  CO2 31 27  GLUCOSE 81 80  BUN 42* 41*  CREATININE 2.60* 2.64*  CALCIUM 8.8 8.5  ALT 6 --  Studies/Results: US Abdomen Complete  06/02/2012  **ADDENDUM** CREATED: 06/02/2012 10:14:51  The following correction is made to the original report:  Additional finding:  Small left pleural effusion.  **END ADDENDUM** SIGNED BY: Reyes Ivan, M.D.   06/02/2012   *RADIOLOGY REPORT*  Clinical Data:  Nausea and vomiting.  COMPLETE ABDOMINAL ULTRASOUND  Comparison:  US renal 11/01/2011 and CT abdomen pelvis 10/20/2011.  Findings:  Gallbladder:  Nonshadowing echogenic sludge is seen in the gallbladder.  Gallbladder wall measures  2 mm.  No sonographic Murphy's sign.  Common bile duct:  Measures 8 mm, within normal limits for age.  Liver:  No focal lesion identified.  Within normal limits in parenchymal echogenicity.  IVC:  Appears normal.  Pancreas:  Pancreatic tail is obscured by bowel gas.  Otherwise negative.  Spleen:  5.1 cm, negative.  Right Kidney:  Measures 13.7 cm.  An anechoic lesion with increased through transmission off the upper pole right kidney measures 4.7 x 4.7 x 4.6 cm and may contain a thin internal septation and some debris.  A second anechoic lesion with increased through transmission is seen off the lower pole, measuring 2.1 x 2.2 x 1.9 cm.  Parenchymal echogenicity is increased.  No hydronephrosis.  Left Kidney:  Measures 10.9 cm.  Parenchymal echogenicity is increased.  Cortical thinning.  No hydronephrosis.  Abdominal aorta:  Distal portion is obscured by bowel gas. Otherwise, no aneurysm.  Distal portion is obscured by bowel gas.  Otherwise, no aneurysm  IMPRESSION:  1.  Gallbladder sludge. 2.  Right renal cysts, as before.  Upper pole cyst appears minimally complex. 3.  Chronic medical renal disease.  Original Report Authenticated By: Reyes Ivan, M.D. ( 06/02/2012 16:10:96 )    2D ECHOCARDIOGRAM Study Conclusions  - Left ventricle: The cavity size was normal. Wall thickness was normal. Systolic function was normal. The estimated ejection fraction was in the range of 55% to 60%. Wall motion was normal; there were no regional wall motion abnormalities. Doppler parameters are consistent with abnormal left ventricular relaxation (grade 1 diastolic dysfunction). Equivocal findings for elevated mean left atrial pressure. - Aortic valve: There was moderate stenosis. Valve area: 1.37cm^2(VTI). Valve area: 1.25cm^2 (Vmax). Valve area: 1.22cm^2 (Vmean). - Mitral valve: Calcified annulus. Mildly thickened leaflets - Left atrium: The atrium was mildly dilated.  - Atrial septum: No defect or patent foramen ovale  was identified. - Pulmonary arteries: Systolic pressure was mildly increased. PA peak pressure: 40mm Hg (S).    Medications:  Scheduled:    . antiseptic oral rinse  15 mL Mouth Rinse BID  . aspirin EC  81 mg Oral Daily  . cholecalciferol  2,000 Units Oral Daily  . darbepoetin (ARANESP) injection - NON-DIALYSIS  100 mcg Subcutaneous Once  . docusate sodium  200 mg Oral BID  . dronedarone  400 mg Oral BID WC  . ezetimibe  10 mg Oral Daily  . ferumoxytol  510 mg Intravenous Once  . furosemide  20 mg Intravenous Once  . furosemide  20 mg Intravenous Once  . furosemide  80 mg Oral BID  . insulin aspart  0-5 Units Subcutaneous QHS  . insulin aspart  0-9 Units Subcutaneous TID WC  . metoprolol succinate  25 mg Oral Daily  . pantoprazole  40 mg Oral Q1200  . polyethylene glycol  17 g Oral Daily  . sodium chloride  3 mL Intravenous Q12H  . sodium chloride  3 mL Intravenous Q12H  . DISCONTD: furosemide  80 mg  Intravenous BID   Continuous:  ZOX:WRUEAV chloride, albuterol, HYDROcodone-acetaminophen, nitroGLYCERIN, ondansetron (ZOFRAN) IV, senna, sodium chloride, zolpidem  Assessment/Plan:  Principal Problem:  *Acute diastolic congestive heart failure, 2D Oct 2012 Active Problems:  CKD (chronic kidney disease) stage 4, GFR 15-29 ml/min  Type II or unspecified type diabetes mellitus without mention of complication, uncontrolled  Acute on chronic renal failure  CAD (coronary artery disease)  Anemia, unspecified  Nausea and/or vomiting    Acute Diastolic CHF Feels better. She has diuresed. Lasix changed to oral route. Cardiology has seen the patient and assisting with management. ECHO report is mentioned above. No ACEI due to renal dysfunction. Strict I/O. Daily weights. Monitor electrolytes. BP is borderline.   Acute on Chronic Renal Failure (Stage 4) Per Nephrology this appears to be chronic renal failure. She will need follow up with Nephrology as OP. US shows renal cysts. May  need further work up as OP.   Anemia Improved post transfusion. Likely secondary to chronic disease. Anemia panel suggests chronic disease.  Nausea and Vomiting Symptoms have improved. US abdomen report reviewed. Was likely from CHF. Abdomen is benign. Continue to monitor. UA was normal.  H/O CAD Stable. Troponin is negative. Had recent cath in April without significant obstruction.  Questionable Recent Pneumonia/Bronchitis Completed course of antibiotics  H/O Paroxysmal A Fib Stable. Continue Dronedarone. Not on anti-coagulation due to h/o GI bleed.  H/O PE Has IVC filter  H/O HTN Monitor. On home meds.  Code Status Full Code  DVT Prophylaxis SCD's  Disposition Home when stable. Possibly 8/20. Check RA sats with ambulation.   LOS: 3 days   [facility]  [facility] Pager [phone] 06/02/2012, 11:38 AM

## 2012-06-02 NOTE — Progress Notes (Signed)
Physical Therapy Treatment Patient Details Name: Christine [name] MRN: [id number] DOB: [birth year] Today's Date: 06/02/2012 Time: 1050-1102 PT Time Calculation (min): 12 min  PT Assessment / Plan / Recommendation Comments on Treatment Session  Min/guard assist for bed to 3-in-1 to recliner transfers. Activity limited by DOE, SaO2 100% on 2L O2. Pt fatigues quickly.     Follow Up Recommendations  Home health PT;Supervision/Assistance - 24 hour    Barriers to Discharge        Equipment Recommendations  None recommended by PT    Recommendations for Other Services    Frequency Min 3X/week   Plan Discharge plan remains appropriate    Precautions / Restrictions Precautions Precautions: None Restrictions Weight Bearing Restrictions: No   Pertinent Vitals/Pain *9/10 B hands, chronic pain, premedicated but "medicine doesn't help" 100% SaO2 on 2L O2 [place] with activity, pt reported feeling SOB**    Mobility  Bed Mobility Supine to Sit: 4: Min assist;HOB elevated Transfers Transfers: Stand Pivot Transfers Sit to Stand: 4: Min guard;From bed;With upper extremity assist;With armrests Stand to Sit: 4: Min guard;To bed;With upper extremity assist;With armrests;To chair/3-in-1 Stand Pivot Transfers: 4: Min guard Details for Transfer Assistance: SPT from bed to [facility] then [facility] to recliner using armrests, Min/guard assist for safety Ambulation/Gait Ambulation/Gait Assistance: Not tested (comment)    Exercises     PT Diagnosis:    PT Problem List:   PT Treatment Interventions:     PT Goals Acute Rehab PT Goals PT Goal Formulation: With patient Time For Goal Achievement: 06/06/12 Potential to Achieve Goals: Good Pt will go Supine/Side to Sit: with supervision PT Goal: Supine/Side to Sit - Progress: Progressing toward goal Pt will go Sit to Supine/Side: with min assist Pt will go Sit to Stand: with supervision PT Goal: Sit to Stand - Progress: Progressing toward goal Pt will go Stand to  Sit: with supervision PT Goal: Stand to Sit - Progress: Progressing toward goal Pt will Ambulate: 51 - 150 feet;with supervision;with least restrictive assistive device PT Goal: Ambulate - Progress: Not progressing  Visit Information  Last PT Received On: 06/02/12 Assistance Needed: +1    Subjective Data  Subjective: I feel short of breath. I have pain in both hands.  Patient Stated Goal: none stated   Cognition  Overall Cognitive Status: Appears within functional limits for tasks assessed/performed Arousal/Alertness: Awake/alert Orientation Level: Appears intact for tasks assessed Behavior During Session: [facility] for tasks performed    Balance     End of Session PT - End of Session Activity Tolerance: Patient limited by fatigue;Other (comment) (DOE) Patient left: with call bell/phone within reach;in chair Nurse Communication: Mobility status   GP     Ralene Bathe Kistler 06/02/2012, 11:17 AM [phone]

## 2012-06-02 NOTE — Plan of Care (Signed)
Problem: Phase III Progression Outcomes Goal: Activity at appropriate level-compared to baseline (UP IN CHAIR FOR HEMODIALYSIS)  Outcome: Not Progressing SOB limiting activity tolerance

## 2012-06-02 NOTE — Progress Notes (Signed)
Pt ambulated on RA. Sats dropped to 90% at one point during walk, then increased to 97%. Pt stated she was short of breath and felt like she needed the O2 Christine [name], [employer]

## 2012-06-02 NOTE — Progress Notes (Signed)
Subjective: Interval History: has complaints some breathing prob last pm.  Objective: Vital signs in last 24 hours: Temp:  [97.4 F (36.3 C)-98.8 F (37.1 C)] 98.2 F (36.8 C) (08/19 0534) Pulse Rate:  [68-98] 68  (08/19 0534) Resp:  [18-20] 20  (08/19 0534) BP: (91-139)/(49-79) 100/60 mmHg (08/19 0534) SpO2:  [100 %] 100 % (08/19 0534) Weight:  [114.8 kg (253 lb 1.4 oz)] 114.8 kg (253 lb 1.4 oz) (08/19 0534) Weight change: 0 kg (0 lb)  Intake/Output from previous day: 08/18 0701 - 08/19 0700 In: 751.5 [P.O.:120; Blood:631.5] Out: 2175 [Urine:2175] Intake/Output this shift: Total I/O In: -  Out: 600 [Urine:600]  General appearance: alert, cooperative and no distress Resp: diminished breath sounds bilaterally and rales bibasilar Cardio: S1, S2 normal and systolic murmur: holosystolic 2/6, blowing at apex GI: obese, pos bs, liver down 4 cm Extremities: edema 2+  Lab Results:  [facility] 06/02/12 0450 06/01/12 2158 06/01/12 0510  WBC 15.0* -- 11.9*  HGB 9.1* 9.2* --  HCT 28.1* 27.2* --  PLT 494* -- 483*   BMET:  Basename 06/02/12 0450 06/01/12 0510  NA 134* 133*  K 3.6 3.6  CL 94* 95*  CO2 31 27  GLUCOSE 81 80  BUN 42* 41*  CREATININE 2.60* 2.64*  CALCIUM 8.8 8.5   No results found for this basename: PTH:2 in the last 72 hours Iron Studies:  Basename 05/31/12 0539  IRON 27*  TIBC 115*  TRANSFERRIN --  FERRITIN 798*    Studies/Results: US Abdomen Complete  06/02/2012  **ADDENDUM** CREATED: 06/02/2012 10:14:51  The following correction is made to the original report:  Additional finding:  Small left pleural effusion.  **END ADDENDUM** SIGNED BY: Reyes Ivan, M.D.   06/02/2012   *RADIOLOGY REPORT*  Clinical Data:  Nausea and vomiting.  COMPLETE ABDOMINAL ULTRASOUND  Comparison:  US renal 11/01/2011 and CT abdomen pelvis 10/20/2011.  Findings:  Gallbladder:  Nonshadowing echogenic sludge is seen in the gallbladder.  Gallbladder wall measures 2 mm.  No  sonographic Murphy's sign.  Common bile duct:  Measures 8 mm, within normal limits for age.  Liver:  No focal lesion identified.  Within normal limits in parenchymal echogenicity.  IVC:  Appears normal.  Pancreas:  Pancreatic tail is obscured by bowel gas.  Otherwise negative.  Spleen:  5.1 cm, negative.  Right Kidney:  Measures 13.7 cm.  An anechoic lesion with increased through transmission off the upper pole right kidney measures 4.7 x 4.7 x 4.6 cm and may contain a thin internal septation and some debris.  A second anechoic lesion with increased through transmission is seen off the lower pole, measuring 2.1 x 2.2 x 1.9 cm.  Parenchymal echogenicity is increased.  No hydronephrosis.  Left Kidney:  Measures 10.9 cm.  Parenchymal echogenicity is increased.  Cortical thinning.  No hydronephrosis.  Abdominal aorta:  Distal portion is obscured by bowel gas. Otherwise, no aneurysm.  Distal portion is obscured by bowel gas.  Otherwise, no aneurysm  IMPRESSION:  1.  Gallbladder sludge. 2.  Right renal cysts, as before.  Upper pole cyst appears minimally complex. 3.  Chronic medical renal disease.  Original Report Authenticated By: Reyes Ivan, M.D. ( 06/02/2012 16:10:96 )     I have reviewed the patient's current medications.  Assessment/Plan: 1CKD stable, diuresing, can use po with current dosing. 2 Anemia Fe ok, will need epo in future 3 HPTH check 4 Obesity 5 Uterine Ca P po lasix, epo, PTH  LOS: 3 days   Loula Marcella L 06/02/2012,11:11 AM

## 2012-06-02 NOTE — Progress Notes (Signed)
[facility]  DAILY PROGRESS NOTE   Subjective:  Breathing is definitely better. Creatinine seems to have reached a plateau.  Adequate response to PRBC's.  Objective:  Temp:  [97.4 F (36.3 C)-98.8 F (37.1 C)] 98.2 F (36.8 C) (08/19 0534) Pulse Rate:  [68-98] 68  (08/19 0534) Resp:  [18-20] 20  (08/19 0534) BP: (100-139)/(57-79) 100/60 mmHg (08/19 0534) SpO2:  [100 %] 100 % (08/19 1113) Weight:  [114.8 kg (253 lb 1.4 oz)] 114.8 kg (253 lb 1.4 oz) (08/19 0534) Weight change: 0 kg (0 lb)  Intake/Output from previous day: 08/18 0701 - 08/19 0700 In: 751.5 [P.O.:120; Blood:631.5] Out: 2175 [Urine:2175]  Intake/Output from this shift: Total I/O In: -  Out: 950 [Urine:950]  Medications: Current Facility-Administered Medications  Medication Dose Route Frequency Provider Last Rate Last Dose  . 0.9 %  sodium chloride infusion  250 mL Intravenous PRN Massie Maroon, MD      . albuterol (PROVENTIL HFA;VENTOLIN HFA) 108 (90 BASE) MCG/ACT inhaler 2 puff  2 puff Inhalation Q6H PRN Massie Maroon, MD   2 puff at 06/01/12 2236  . antiseptic oral rinse (BIOTENE) solution 15 mL  15 mL Mouth Rinse BID Massie Maroon, MD   15 mL at 06/02/12 0804  . aspirin EC tablet 81 mg  81 mg Oral Daily Massie Maroon, MD   81 mg at 06/02/12 1018  . cholecalciferol (VITAMIN D) tablet 2,000 Units  2,000 Units Oral Daily Massie Maroon, MD   2,000 Units at 06/02/12 1018  . darbepoetin (ARANESP) injection 100 mcg  100 mcg Subcutaneous Once Trevor Iha, MD   100 mcg at 06/02/12 1319  . docusate sodium (COLACE) capsule 200 mg  200 mg Oral BID Massie Maroon, MD   200 mg at 06/02/12 1018  . dronedarone (MULTAQ) tablet 400 mg  400 mg Oral BID WC Massie Maroon, MD   400 mg at 06/02/12 0804  . ezetimibe (ZETIA) tablet 10 mg  10 mg Oral Daily Massie Maroon, MD   10 mg at 06/02/12 1018  . ferumoxytol [facility]) injection 510 mg  510 mg Intravenous Once Trevor Iha, MD   510 mg at 06/02/12  1320  . furosemide (LASIX) injection 20 mg  20 mg Intravenous Once Osvaldo Shipper, MD   20 mg at 06/01/12 1548  . furosemide (LASIX) injection 20 mg  20 mg Intravenous Once Osvaldo Shipper, MD   20 mg at 06/01/12 1903  . furosemide (LASIX) tablet 80 mg  80 mg Oral BID Trevor Iha, MD      . HYDROcodone-acetaminophen [facility]) 10-325 MG per tablet 1 tablet  1 tablet Oral Q6H PRN Massie Maroon, MD   1 tablet at 06/02/12 1026  . insulin aspart (novoLOG) injection 0-5 Units  0-5 Units Subcutaneous QHS Massie Maroon, MD      . insulin aspart (novoLOG) injection 0-9 Units  0-9 Units Subcutaneous TID WC Massie Maroon, MD      . metoprolol succinate (TOPROL-XL) 24 hr tablet 25 mg  25 mg Oral Daily Massie Maroon, MD   25 mg at 06/02/12 1018  . nitroGLYCERIN (NITROSTAT) SL tablet 0.4 mg  0.4 mg Sublingual Q5 min PRN Massie Maroon, MD      . ondansetron [facility]) injection 4 mg  4 mg Intravenous Q6H PRN Osvaldo Shipper, MD   4 mg at 05/31/12 0809  . pantoprazole (PROTONIX) EC tablet 40 mg  40 mg Oral Q1200 Massie Maroon, MD   40 mg at 06/02/12 1018  . polyethylene glycol (MIRALAX / GLYCOLAX) packet 17 g  17 g Oral Daily Massie Maroon, MD   17 g at 06/02/12 1026  . senna (SENOKOT) tablet 8.6 mg  1 tablet Oral Daily PRN Massie Maroon, MD   8.6 mg at 06/01/12 1016  . sodium chloride 0.9 % injection 3 mL  3 mL Intravenous Q12H Massie Maroon, MD   3 mL at 06/01/12 2101  . sodium chloride 0.9 % injection 3 mL  3 mL Intravenous Q12H Massie Maroon, MD   3 mL at 06/02/12 1000  . sodium chloride 0.9 % injection 3 mL  3 mL Intravenous PRN Massie Maroon, MD      . zolpidem Remus Loffler) tablet 5-10 mg  5-10 mg Oral QHS PRN Massie Maroon, MD      . DISCONTD: furosemide (LASIX) injection 80 mg  80 mg Intravenous BID Osvaldo Shipper, MD   80 mg at 06/02/12 0804    Physical Exam: General appearance: alert and no distress Neck: no adenopathy, no carotid bruit, no JVD, supple, symmetrical, trachea midline and thyroid not enlarged,  symmetric, no tenderness/mass/nodules Lungs: diminished breath sounds bibasilar Heart: regular rate and rhythm, S1, S2 normal, no murmur, click, rub or gallop Abdomen: morbidly obese Extremities: massive lymphedema Pulses: 2+ and symmetric  Lab Results: Results for orders placed during the hospital encounter of 05/30/12 (from the past 48 hour(s))  GLUCOSE, CAPILLARY     Status: Normal   Collection Time   05/31/12  4:41 PM      Component Value Range Comment   Glucose-Capillary 97  70 - 99 mg/dL   GLUCOSE, CAPILLARY     Status: Normal   Collection Time   05/31/12  9:41 PM      Component Value Range Comment   Glucose-Capillary 83  70 - 99 mg/dL    Comment 1 Notify RN     CBC     Status: Abnormal   Collection Time   06/01/12  5:10 AM      Component Value Range Comment   WBC 11.9 (*) 4.0 - 10.5 K/uL    RBC 2.43 (*) 3.87 - 5.11 MIL/uL    Hemoglobin 6.9 (*) 12.0 - 15.0 g/dL    HCT 16.1 (*) 09.6 - 46.0 %    MCV 90.5  78.0 - 100.0 fL    MCH 28.4  26.0 - 34.0 pg    MCHC 31.4  30.0 - 36.0 g/dL    RDW 04.5 (*) 40.9 - 15.5 %    Platelets 483 (*) 150 - 400 K/uL   RENAL FUNCTION PANEL     Status: Abnormal   Collection Time   06/01/12  5:10 AM      Component Value Range Comment   Sodium 133 (*) 135 - 145 mEq/L    Potassium 3.6  3.5 - 5.1 mEq/L    Chloride 95 (*) 96 - 112 mEq/L    CO2 27  19 - 32 mEq/L    Glucose, Bld 80  70 - 99 mg/dL    BUN 41 (*) 6 - 23 mg/dL    Creatinine, Ser 8.11 (*) 0.50 - 1.10 mg/dL    Calcium 8.5  8.4 - 91.4 mg/dL    Phosphorus 3.8  2.3 - 4.6 mg/dL    Albumin 1.8 (*) 3.5 - 5.2 g/dL    GFR calc non Af Amer 16 (*) >  90 mL/min    GFR calc Af Amer 19 (*) >90 mL/min   GLUCOSE, CAPILLARY     Status: Normal   Collection Time   06/01/12  7:53 AM      Component Value Range Comment   Glucose-Capillary 86  70 - 99 mg/dL   GLUCOSE, CAPILLARY     Status: Normal   Collection Time   06/01/12 11:59 AM      Component Value Range Comment   Glucose-Capillary 84  70 - 99 mg/dL    GLUCOSE, CAPILLARY     Status: Normal   Collection Time   06/01/12  4:40 PM      Component Value Range Comment   Glucose-Capillary 77  70 - 99 mg/dL   GLUCOSE, CAPILLARY     Status: Normal   Collection Time   06/01/12  9:08 PM      Component Value Range Comment   Glucose-Capillary 99  70 - 99 mg/dL    Comment 1 Notify RN     HEMOGLOBIN AND HEMATOCRIT, BLOOD     Status: Abnormal   Collection Time   06/01/12  9:58 PM      Component Value Range Comment   Hemoglobin 9.2 (*) 12.0 - 15.0 g/dL    HCT 16.1 (*) 09.6 - 46.0 % REPEATED TO VERIFY  CBC     Status: Abnormal   Collection Time   06/02/12  4:50 AM      Component Value Range Comment   WBC 15.0 (*) 4.0 - 10.5 K/uL    RBC 3.18 (*) 3.87 - 5.11 MIL/uL    Hemoglobin 9.1 (*) 12.0 - 15.0 g/dL    HCT 04.5 (*) 40.9 - 46.0 %    MCV 88.4  78.0 - 100.0 fL    MCH 28.6  26.0 - 34.0 pg    MCHC 32.4  30.0 - 36.0 g/dL    RDW 81.1 (*) 91.4 - 15.5 %    Platelets 494 (*) 150 - 400 K/uL   COMPREHENSIVE METABOLIC PANEL     Status: Abnormal   Collection Time   06/02/12  4:50 AM      Component Value Range Comment   Sodium 134 (*) 135 - 145 mEq/L    Potassium 3.6  3.5 - 5.1 mEq/L    Chloride 94 (*) 96 - 112 mEq/L    CO2 31  19 - 32 mEq/L    Glucose, Bld 81  70 - 99 mg/dL    BUN 42 (*) 6 - 23 mg/dL    Creatinine, Ser 7.82 (*) 0.50 - 1.10 mg/dL    Calcium 8.8  8.4 - 95.6 mg/dL    Total Protein 6.7  6.0 - 8.3 g/dL    Albumin 2.0 (*) 3.5 - 5.2 g/dL    AST 18  0 - 37 U/L    ALT 6  0 - 35 U/L    Alkaline Phosphatase 60  39 - 117 U/L    Total Bilirubin 0.4  0.3 - 1.2 mg/dL    GFR calc non Af Amer 17 (*) >90 mL/min    GFR calc Af Amer 19 (*) >90 mL/min   GLUCOSE, CAPILLARY     Status: Normal   Collection Time   06/02/12  8:28 AM      Component Value Range Comment   Glucose-Capillary 83  70 - 99 mg/dL   GLUCOSE, CAPILLARY     Status: Normal   Collection Time   06/02/12 12:11 PM  Component Value Range Comment   Glucose-Capillary 95  70 - 99  mg/dL     Imaging: US Abdomen Complete  06/02/2012  **ADDENDUM** CREATED: 06/02/2012 10:14:51  The following correction is made to the original report:  Additional finding:  Small left pleural effusion.  **END ADDENDUM** SIGNED BY: Reyes Ivan, M.D.   06/02/2012   *RADIOLOGY REPORT*  Clinical Data:  Nausea and vomiting.  COMPLETE ABDOMINAL ULTRASOUND  Comparison:  US renal 11/01/2011 and CT abdomen pelvis 10/20/2011.  Findings:  Gallbladder:  Nonshadowing echogenic sludge is seen in the gallbladder.  Gallbladder wall measures 2 mm.  No sonographic Murphy's sign.  Common bile duct:  Measures 8 mm, within normal limits for age.  Liver:  No focal lesion identified.  Within normal limits in parenchymal echogenicity.  IVC:  Appears normal.  Pancreas:  Pancreatic tail is obscured by bowel gas.  Otherwise negative.  Spleen:  5.1 cm, negative.  Right Kidney:  Measures 13.7 cm.  An anechoic lesion with increased through transmission off the upper pole right kidney measures 4.7 x 4.7 x 4.6 cm and may contain a thin internal septation and some debris.  A second anechoic lesion with increased through transmission is seen off the lower pole, measuring 2.1 x 2.2 x 1.9 cm.  Parenchymal echogenicity is increased.  No hydronephrosis.  Left Kidney:  Measures 10.9 cm.  Parenchymal echogenicity is increased.  Cortical thinning.  No hydronephrosis.  Abdominal aorta:  Distal portion is obscured by bowel gas. Otherwise, no aneurysm.  Distal portion is obscured by bowel gas.  Otherwise, no aneurysm  IMPRESSION:  1.  Gallbladder sludge. 2.  Right renal cysts, as before.  Upper pole cyst appears minimally complex. 3.  Chronic medical renal disease.  Original Report Authenticated By: Reyes Ivan, M.D. ( 06/02/2012 04:54:09 )     Assessment:  1. Principal Problem: 2.  *Acute diastolic congestive heart failure, 2D Oct 2012 3. Active Problems: 4.  CKD (chronic kidney disease) stage 4, GFR 15-29 ml/min 5.  Type II or  unspecified type diabetes mellitus without mention of complication, uncontrolled 6.  Acute on chronic renal failure 7.  CAD (coronary artery disease) 8.  Anemia, unspecified 9.  Nausea and/or vomiting 10.   Plan:  1. Close to 3.5L net negative. Plan to switch to lasix 80 mg po BID starting tonight. Advance medical renal disease probably contributing to Anemia of chronic disease. No hemoccult noted, but she has not had much stool. HR and blood pressure control are adequate, not much room to titrate medications. Probably close to discharge, maybe tomorrow if she has adequate urine output with po diuretics.  Time Spent Directly with Patient:  15 minutes  Length of Stay:  LOS: 3 days   Chrystie Nose, MD, [facility] Attending Cardiologist [facility]  Nancy Manuele C 06/02/2012, 1:56 PM

## 2012-06-03 LAB — CBC
MCH: 28.4 pg (ref 26.0–34.0)
MCHC: 31.8 g/dL (ref 30.0–36.0)
Platelets: 491 10*3/uL — ABNORMAL HIGH (ref 150–400)
RBC: 3.1 MIL/uL — ABNORMAL LOW (ref 3.87–5.11)
RDW: 16.7 % — ABNORMAL HIGH (ref 11.5–15.5)

## 2012-06-03 LAB — COMPREHENSIVE METABOLIC PANEL
AST: 15 U/L (ref 0–37)
Albumin: 1.9 g/dL — ABNORMAL LOW (ref 3.5–5.2)
BUN: 41 mg/dL — ABNORMAL HIGH (ref 6–23)
Calcium: 8.7 mg/dL (ref 8.4–10.5)
Chloride: 95 mEq/L — ABNORMAL LOW (ref 96–112)
Creatinine, Ser: 2.49 mg/dL — ABNORMAL HIGH (ref 0.50–1.10)
Total Protein: 6.5 g/dL (ref 6.0–8.3)

## 2012-06-03 LAB — GLUCOSE, CAPILLARY
Glucose-Capillary: 85 mg/dL (ref 70–99)
Glucose-Capillary: 95 mg/dL (ref 70–99)

## 2012-06-03 LAB — IMMUNOFIXATION ELECTROPHORESIS
IgG (Immunoglobin G), Serum: 2140 mg/dL — ABNORMAL HIGH (ref 690–1700)
Total Protein ELP: 6.4 g/dL (ref 6.0–8.3)

## 2012-06-03 LAB — PARATHYROID HORMONE, INTACT (NO CA): PTH: 55.1 pg/mL (ref 14.0–72.0)

## 2012-06-03 LAB — PHOSPHORUS: Phosphorus: 3.7 mg/dL (ref 2.3–4.6)

## 2012-06-03 LAB — CREATININE CLEARANCE, URINE, 24 HOUR
Creatinine, Urine: 49.6 mg/dL
Creatinine: 2.5 mg/dL — ABNORMAL HIGH (ref 0.50–1.10)
Urine Total Volume-CRCL: 2025 mL

## 2012-06-03 MED ORDER — FUROSEMIDE 40 MG PO TABS: 80.0000 mg | ORAL_TABLET | Freq: Two times a day (BID) | ORAL | Status: DC

## 2012-06-03 NOTE — Progress Notes (Signed)
Subjective: Interval History: none.  Objective: Vital signs in last 24 hours: Temp:  [97.5 F (36.4 C)-97.9 F (36.6 C)] 97.5 F (36.4 C) (08/20 0621) Pulse Rate:  [67-74] 74  (08/20 0621) Resp:  [16-19] 19  (08/20 0621) BP: (96-115)/(56-76) 111/56 mmHg (08/20 0621) SpO2:  [100 %] 100 % (08/20 0621) Weight change:   Intake/Output from previous day: 08/19 0701 - 08/20 0700 In: 480 [P.O.:480] Out: 1900 [Urine:1900] Intake/Output this shift:    General appearance: moderately obese Resp: diminished breath sounds bilaterally and rales bibasilar Cardio: S1, S2 normal and systolic murmur: holosystolic 2/6, blowing at apex GI: obese, pos bs, liver down 5 cm Extremities: edema 2+  Lab Results:  Basename 06/03/12 0435 06/02/12 0450  WBC 13.0* 15.0*  HGB 8.8* 9.1*  HCT 27.7* 28.1*  PLT 491* 494*   BMET:  Basename 06/03/12 0435 06/02/12 0450  NA 134* 134*  K 3.6 3.6  CL 95* 94*  CO2 32 31  GLUCOSE 82 81  BUN 41* 42*  CREATININE 2.49* 2.60*  CALCIUM 8.7 8.8   No results found for this basename: PTH:2 in the last 72 hours Iron Studies: No results found for this basename: IRON,TIBC,TRANSFERRIN,FERRITIN in the last 72 hours  Studies/Results: US Abdomen Complete  06/02/2012  **ADDENDUM** CREATED: 06/02/2012 10:14:51  The following correction is made to the original report:  Additional finding:  Small left pleural effusion.  **END ADDENDUM** SIGNED BY: Reyes Ivan, M.D.   06/02/2012   *RADIOLOGY REPORT*  Clinical Data:  Nausea and vomiting.  COMPLETE ABDOMINAL ULTRASOUND  Comparison:  US renal 11/01/2011 and CT abdomen pelvis 10/20/2011.  Findings:  Gallbladder:  Nonshadowing echogenic sludge is seen in the gallbladder.  Gallbladder wall measures 2 mm.  No sonographic Murphy's sign.  Common bile duct:  Measures 8 mm, within normal limits for age.  Liver:  No focal lesion identified.  Within normal limits in parenchymal echogenicity.  IVC:  Appears normal.  Pancreas:   Pancreatic tail is obscured by bowel gas.  Otherwise negative.  Spleen:  5.1 cm, negative.  Right Kidney:  Measures 13.7 cm.  An anechoic lesion with increased through transmission off the upper pole right kidney measures 4.7 x 4.7 x 4.6 cm and may contain a thin internal septation and some debris.  A second anechoic lesion with increased through transmission is seen off the lower pole, measuring 2.1 x 2.2 x 1.9 cm.  Parenchymal echogenicity is increased.  No hydronephrosis.  Left Kidney:  Measures 10.9 cm.  Parenchymal echogenicity is increased.  Cortical thinning.  No hydronephrosis.  Abdominal aorta:  Distal portion is obscured by bowel gas. Otherwise, no aneurysm.  Distal portion is obscured by bowel gas.  Otherwise, no aneurysm  IMPRESSION:  1.  Gallbladder sludge. 2.  Right renal cysts, as before.  Upper pole cyst appears minimally complex. 3.  Chronic medical renal disease.  Original Report Authenticated By: Reyes Ivan, M.D. ( 06/02/2012 16:10:96 )     I have reviewed the patient's current medications.  Assessment/Plan: 1 CKD 4 vol improving, can cont current Furosemide.  PTH P.  Ok to D/C 2 Anemia Fe/epo 3 CAD per cards 4 Obesity 5 Uterine Ca  P cont Lasix, will S/o at this time    LOS: 4 days   Jeslyn Amsler L 06/03/2012,10:31 AM

## 2012-06-03 NOTE — Progress Notes (Signed)
Occupational Therapy Treatment Patient Details Name: Christine [name] MRN: [id number] DOB: November 22, 1930 Today's Date: 06/03/2012 Time: 6213-0865 OT Time Calculation (min): 18 min  OT Assessment / Plan / Recommendation Comments on Treatment Session sats 100% on 2 liters 02; 94% without ambulating to bathroom; dyspnea 2/4    Follow Up Recommendations  Other (comment) (home health aide)    Barriers to Discharge       Equipment Recommendations  None recommended by PT;None recommended by OT    Recommendations for Other Services    Frequency Min 2X/week   Plan      Precautions / Restrictions Precautions Precautions: None Restrictions Weight Bearing Restrictions: No   Pertinent Vitals/Pain No pain.      ADL  Grooming: Performed;Wash/dry hands;Supervision/safety Where Assessed - Grooming: Supported Copywriter, advertising: Research scientist (life sciences) Method: Surveyor, minerals: Extra wide Materials engineer and Hygiene: Performed;Set up Where Assessed - Toileting Clothing Manipulation and Hygiene: Lean right and/or left Transfers/Ambulation Related to ADLs: ambulated to sink in bathroom with RW with supervision.  02 sats without 02 were 94; dyspnea 2/4.  Pt practices pursed lip breathing ADL Comments: used 3:1 by bedside as wide 3:1 would not fit over commode    OT Diagnosis:    OT Problem List:   OT Treatment Interventions:     OT Goals Acute Rehab OT Goals Time For Goal Achievement: 06/13/12 ADL Goals Pt Will Perform Grooming: with modified independence;Standing at sink ADL Goal: Grooming - Progress: Progressing toward goals Pt Will Transfer to Toilet: with modified independence;Ambulation;3-in-1 ADL Goal: Toilet Transfer - Progress: Met Pt Will Perform Toileting - Hygiene: with modified independence;Sit to stand from 3-in-1/toilet ADL Goal: Toileting - Hygiene - Progress: Progressing toward goals  Visit  Information  Last OT Received On: 06/03/12 Assistance Needed: +1    Subjective Data      Prior Functioning       Cognition  Overall Cognitive Status: Appears within functional limits for tasks assessed/performed Behavior During Session: [facility] for tasks performed    Mobility Bed Mobility Supine to Sit: 4: Min assist;HOB elevated Sit to Supine: 3: Mod assist Transfers Sit to Stand: 5: Supervision   Exercises    Balance    End of Session OT - End of Session Activity Tolerance: Other (comment);Patient limited by fatigue (dyspnea) Patient left: in chair;with call bell/phone within reach  GO     [facility] 06/03/2012, 10:31 AM Marica Otter, OTR/L [phone] 06/03/2012

## 2012-06-03 NOTE — Discharge Summary (Signed)
Physician Discharge Summary  Patient ID: Christine [name] MRN: [id number] DOB/AGE: [birth year] 78 y.o.  Admit date: 05/30/2012 Discharge date: 06/03/2012  PCP: Oliver Barre, MD  DISCHARGE DIAGNOSES:  Principal Problem:  *Acute diastolic congestive heart failure, 2D Oct 2012 Active Problems:  CKD (chronic kidney disease) stage 4, GFR 15-29 ml/min  Type II or unspecified type diabetes mellitus without mention of complication, uncontrolled  Acute on chronic renal failure  CAD (coronary artery disease)  Anemia, unspecified  Nausea and/or vomiting   RECOMMENDATIONS TO PCP: 1. Will need Bmet to be checked in 1 week 2. Refer to Nephrology. Was seen by Dr. Darrick Penna in hospital 3. Will need to follow up with Dr. Allyson Sabal  DISCHARGE CONDITION: fair  INITIAL HISTORY: 78 yo female with CHF (diastolic), aortic stenosis (mild), P-afib, CKD stage 4, Anemia presents with c/o sob, worse with exertion. Had cough with yellow sputum, which changed to white (on levaquin). Denied fever, chills, cp, palp, n/v, diarrhea, brbpr, black stool. Pt presented to ED, pox 80's with walking, and CXR suggested moderate CHF. BNP elevated. Pt notes that her pcp discontinued her Lasix x 1 day due to worsening renal insufficiency. Pt was be admitted for CHF.   HOSPITAL COURSE:   Acute Diastolic CHF  She was started on IV lasix. She gradually improved. She was seen by cardiology and they agreed with management. ECHO was repeated and report is below. Moderate aortic stenosis was noted. She feels better but not completely back to baseline. She may continue rest of the treatment at home. She was transitioned to oral lasix yesterday. She will be discharged on a higher dose of lasix. No ACEI due to renal dysfunction.  Acute on Chronic Renal Failure (Stage 4)  Per Nephrology this appears to be chronic renal failure. She will need follow up with Nephrology as OP. US shows renal cysts. May need further work up as OP. Creatinine  is stable.  Anemia  Her hemoglobin dropped to less than 7. She was transfused 2 units of PRBC. Her symptoms improved. Anemia panel suggests chronic disease.   Nausea and Vomiting  Symptoms have improved. US abdomen report reviewed. Was likely from CHF. Abdomen is benign. UA was normal.   H/O CAD  Stable. Troponin is negative. Had recent cath in April without significant obstruction.   Questionable Recent Pneumonia/Bronchitis  She completed course of antibiotics that was prescribed to her by her PCP.   H/O Paroxysmal A Fib  Stable. Continue Dronedarone. Not on anti-coagulation due to h/o GI/GU bleed.   H/O PE  Has IVC filter   H/O HTN BP remains stable. Continue BB.  Patient remains stable. She has been seen by PT/OT and home health will be resumed. Her O2 sats remained 90% or greater even with ambulation. She denies any pain. She is stable for discharge.  2D ECHOCARDIOGRAM Study Conclusions  - Left ventricle: The cavity size was normal. Wall thickness was normal. Systolic function was normal. The estimated ejection fraction was in the range of 55% to 60%. Wall motion was normal; there were no regional wall motion abnormalities. Doppler parameters are consistent with abnormal left ventricular relaxation (grade 1 diastolic dysfunction). Equivocal findings for elevated mean left atrial pressure. - Aortic valve: There was moderate stenosis. Valve area: 1.37cm^2(VTI). Valve area: 1.25cm^2 (Vmax). Valve area: 1.22cm^2 (Vmean). - Mitral valve: Calcified annulus. Mildly thickened leaflets. - Left atrium: The atrium was mildly dilated. - Atrial septum: No defect or patent foramen ovale was identified. - Pulmonary arteries: Systolic  pressure was mildly increased. PA peak pressure: 40mm Hg (S).    IMAGING STUDIES Dg Chest 2 View  05/21/2012  *RADIOLOGY REPORT*  Clinical Data: Cough, shortness of breath, follow up pleural effusion  CHEST - 2 VIEW  Comparison: 03/22/2012  Findings:  Cardiomegaly again noted.  Central mild vascular congestion without convincing pulmonary edema.  Thickening of the right minor fissure noted. Central mild bronchitic changes. Persistent bilateral small pleural effusion with streaky bilateral basilar atelectasis or infiltrate.  IMPRESSION: Central mild bronchitic changes.  Central mild vascular congestion without pulmonary edema.  Persistent small bilateral pleural effusion with streaky bilateral basilar atelectasis or infiltrate.  Original Report Authenticated By: Natasha Mead, M.D.   US Abdomen Complete  06/02/2012  **ADDENDUM** CREATED: 06/02/2012 10:14:51  The following correction is made to the original report:  Additional finding:  Small left pleural effusion.  **END ADDENDUM** SIGNED BY: Reyes Ivan, M.D.   06/02/2012   *RADIOLOGY REPORT*  Clinical Data:  Nausea and vomiting.  COMPLETE ABDOMINAL ULTRASOUND  Comparison:  US renal 11/01/2011 and CT abdomen pelvis 10/20/2011.  Findings:  Gallbladder:  Nonshadowing echogenic sludge is seen in the gallbladder.  Gallbladder wall measures 2 mm.  No sonographic Murphy's sign.  Common bile duct:  Measures 8 mm, within normal limits for age.  Liver:  No focal lesion identified.  Within normal limits in parenchymal echogenicity.  IVC:  Appears normal.  Pancreas:  Pancreatic tail is obscured by bowel gas.  Otherwise negative.  Spleen:  5.1 cm, negative.  Right Kidney:  Measures 13.7 cm.  An anechoic lesion with increased through transmission off the upper pole right kidney measures 4.7 x 4.7 x 4.6 cm and may contain a thin internal septation and some debris.  A second anechoic lesion with increased through transmission is seen off the lower pole, measuring 2.1 x 2.2 x 1.9 cm.  Parenchymal echogenicity is increased.  No hydronephrosis.  Left Kidney:  Measures 10.9 cm.  Parenchymal echogenicity is increased.  Cortical thinning.  No hydronephrosis.  Abdominal aorta:  Distal portion is obscured by bowel gas. Otherwise,  no aneurysm.  Distal portion is obscured by bowel gas.  Otherwise, no aneurysm  IMPRESSION:  1.  Gallbladder sludge. 2.  Right renal cysts, as before.  Upper pole cyst appears minimally complex. 3.  Chronic medical renal disease.  Original Report Authenticated By: Reyes Ivan, M.D. ( 06/02/2012 16:10:96 )    Dg Chest Portable 1 View  05/30/2012  *RADIOLOGY REPORT*  Clinical Data: Respiratory distress.  Short of breath.  PORTABLE CHEST - 1 VIEW  Comparison: 05/21/2012.  Findings: Moderate CHF is present with cardiomegaly, pulmonary vascular congestion and interstitial pulmonary edema with basilar predominant alveolar pulmonary edema.  IMPRESSION: Moderate CHF.  Original Report Authenticated By: Andreas Newport, M.D.    DISCHARGE EXAMINATION: Blood pressure 111/56, pulse 74, temperature 97.5 F (36.4 C), temperature source Oral, resp. rate 19, height 5\' 6"  (1.676 m), weight 114.8 kg (253 lb 1.4 oz), SpO2 100.00%. General appearance: alert, cooperative, appears stated age and no distress Resp: decreased air entry at the bases with few crackles. Mostly clear to auscultation. Cardio: regular rate and rhythm, S1, S2 normal, no murmur, click, rub or gallop GI: soft, non-tender; bowel sounds normal; no masses,  no organomegaly Neurologic: Grossly normal  DISPOSITION: Home  Discharge Orders    Future Appointments: Provider: Department: Dept Phone: Center:   06/09/2012 3:30 PM Billie Lade, MD [facility] [phone] None   06/11/2012 2:15 PM Len Blalock  Jonny Ruiz, MD [facility] [phone] [facility]   11/05/2012 10:30 AM Corwin Levins, MD [facility] [phone] [facility]     Future Orders Please Complete By Expires   Diet - low sodium heart healthy      Increase activity slowly      Discharge instructions      Comments:   Be sure to follow up with your heart doctor and your PCP     Current Discharge Medication List    CONTINUE these medications which have CHANGED   Details  furosemide  (LASIX) 40 MG tablet Take 2 tablets (80 mg total) by mouth 2 (two) times daily. Qty: 120 tablet, Refills: 1      CONTINUE these medications which have NOT CHANGED   Details  albuterol (PROVENTIL HFA;VENTOLIN HFA) 108 (90 BASE) MCG/ACT inhaler Inhale 2 puffs into the lungs every 6 (six) hours as needed. For shortness of breath    aspirin EC 81 MG tablet Take 81 mg by mouth daily.    cholecalciferol (VITAMIN D) 1000 UNITS tablet Take 2,000 Units by mouth daily.     docusate sodium (COLACE) 100 MG capsule Take 200 mg by mouth 2 (two) times daily. For diarrhea    dronedarone (MULTAQ) 400 MG tablet Take 400 mg by mouth 2 (two) times daily with a meal.    ezetimibe (ZETIA) 10 MG tablet Take 10 mg by mouth daily.     HYDROcodone-acetaminophen (NORCO) 10-325 MG per tablet Take 1 tablet by mouth every 6 (six) hours as needed. For pain    metoprolol succinate (TOPROL-XL) 25 MG 24 hr tablet Take 25 mg by mouth daily.    Nutritional Supplements (ENSURE PO) Take 1 each by mouth 2 (two) times daily as needed. For supplement    omeprazole (PRILOSEC) 20 MG capsule Take 20 mg by mouth daily.    polyethylene glycol (MIRALAX / GLYCOLAX) packet Take 17 g by mouth daily.    senna (SENOKOT) 8.6 MG TABS Take 1 tablet by mouth.    zolpidem (AMBIEN) 5 MG tablet Take 5-10 mg by mouth at bedtime as needed. For sleep.    nitroGLYCERIN (NITROSTAT) 0.4 MG SL tablet Place 0.4 mg under the tongue every 5 (five) minutes as needed. For chest pain. Do not exceed 3 tablets. If pain is not resolved after 3rd tablet call 911.      STOP taking these medications     levofloxacin (LEVAQUIN) 250 MG tablet        Follow-up Information    Follow up with Oliver Barre, MD. Schedule an appointment as soon as possible for a visit in 1 week. (post hospitalization follow up)    Contact information:   [address] [facility] [address] [place] [address] [place] [address] [phone]       Follow up with  Runell Gess, MD. Schedule an appointment as soon as possible for a visit in 2 weeks.   Contact information:   [address] [place] [address] [phone]       Follow up with DETERDING,JAMES L, MD. Schedule an appointment as soon as possible for a visit in 4 weeks.   Contact information:   [address] [employer] [place] [address] [phone]          TOTAL DISCHARGE TIME: 35 mins  [facility]  [facility] Pager [phone]  06/03/2012, 1:35 PM

## 2012-06-05 LAB — UIFE/LIGHT CHAINS/TP QN, 24-HR UR
Albumin, U: DETECTED
Alpha 1, Urine: DETECTED — AB
Beta, Urine: DETECTED — AB
Gamma Globulin, Urine: DETECTED — AB

## 2012-06-09 ENCOUNTER — Ambulatory Visit: Payer: Medicare Other | Admitting: Radiation Oncology

## 2012-06-11 ENCOUNTER — Telehealth: Payer: Self-pay | Admitting: Internal Medicine

## 2012-06-11 ENCOUNTER — Ambulatory Visit (INDEPENDENT_AMBULATORY_CARE_PROVIDER_SITE_OTHER): Payer: Medicare Other | Admitting: Internal Medicine

## 2012-06-11 ENCOUNTER — Other Ambulatory Visit (INDEPENDENT_AMBULATORY_CARE_PROVIDER_SITE_OTHER): Payer: Medicare Other

## 2012-06-11 ENCOUNTER — Encounter: Payer: Self-pay | Admitting: Internal Medicine

## 2012-06-11 VITALS — BP 102/66 | HR 81 | Temp 97.6°F

## 2012-06-11 DIAGNOSIS — D649 Anemia, unspecified: Secondary | ICD-10-CM

## 2012-06-11 DIAGNOSIS — I509 Heart failure, unspecified: Secondary | ICD-10-CM

## 2012-06-11 DIAGNOSIS — I10 Essential (primary) hypertension: Secondary | ICD-10-CM

## 2012-06-11 DIAGNOSIS — N184 Chronic kidney disease, stage 4 (severe): Secondary | ICD-10-CM

## 2012-06-11 LAB — CBC WITH DIFFERENTIAL/PLATELET
Basophils Absolute: 0 10*3/uL (ref 0.0–0.1)
Basophils Relative: 0.1 % (ref 0.0–3.0)
Eosinophils Absolute: 0.3 10*3/uL (ref 0.0–0.7)
Hemoglobin: 9.4 g/dL — ABNORMAL LOW (ref 12.0–15.0)
Lymphocytes Relative: 13.1 % (ref 12.0–46.0)
Lymphs Abs: 2.4 10*3/uL (ref 0.7–4.0)
MCHC: 31.6 g/dL (ref 30.0–36.0)
MCV: 91.9 fl (ref 78.0–100.0)
Monocytes Absolute: 1.7 10*3/uL — ABNORMAL HIGH (ref 0.1–1.0)
Neutro Abs: 13.9 10*3/uL — ABNORMAL HIGH (ref 1.4–7.7)
RBC: 3.24 Mil/uL — ABNORMAL LOW (ref 3.87–5.11)
RDW: 16.7 % — ABNORMAL HIGH (ref 11.5–14.6)

## 2012-06-11 LAB — BASIC METABOLIC PANEL
BUN: 57 mg/dL — ABNORMAL HIGH (ref 6–23)
Calcium: 8.5 mg/dL (ref 8.4–10.5)
Chloride: 91 mEq/L — ABNORMAL LOW (ref 96–112)
Creatinine, Ser: 3.4 mg/dL — ABNORMAL HIGH (ref 0.4–1.2)
GFR: 16.76 mL/min — ABNORMAL LOW (ref >60.00)

## 2012-06-11 MED ORDER — LEVOFLOXACIN 250 MG PO TABS: 250.0000 mg | ORAL_TABLET | Freq: Every day | ORAL | Status: AC

## 2012-06-11 MED ORDER — HYDROCODONE-ACETAMINOPHEN 10-325 MG PO TABS: 1.0000 | ORAL_TABLET | Freq: Four times a day (QID) | ORAL | Status: DC | PRN

## 2012-06-11 NOTE — Telephone Encounter (Signed)
Done hardcopy to robin  

## 2012-06-11 NOTE — Patient Instructions (Addendum)
Continue all other medications as before No changes in treatment today Please have the pharmacy call with any refills you may need. Please keep your appointments with your specialists as you have planned  - renal  We will fax the lab work from today I think you could benefit form Aranesp (the hormone that helps with anemia) but the kidney doctors are the ones to prescribe Please go to LAB in the Basement for the blood and/or urine tests to be done today - the kidney tests, and hemoglobin (red cell count) Please return in 3 months, or sooner if needed

## 2012-06-11 NOTE — Telephone Encounter (Signed)
Received cbc results with markedly elev WBC, pt already left for home; pt had specifically denied UTI symptoms at OV, did have persistent bibas rales that are ? Dry rales, but with worsening wbc cant r/o pna;  Will tx empircally with levaquin -   Robin to notify son/daughter/pt - done per emr

## 2012-06-11 NOTE — Telephone Encounter (Signed)
Called the patient informed of results and antibiotic.  The patient stated she has one hydrocodone left and would like a refill.

## 2012-06-12 NOTE — Telephone Encounter (Signed)
Faxed hardcopy to [facility] [address] per patient request and informed patient rx sent in.

## 2012-06-15 ENCOUNTER — Encounter: Payer: Self-pay | Admitting: Internal Medicine

## 2012-06-15 NOTE — Assessment & Plan Note (Signed)
stable overall by hx and exam, most recent data reviewed with pt, and pt to continue medical treatment as before SpO2 Readings from Last 3 Encounters:  06/11/12 98%  06/03/12 100%  05/28/12 96%

## 2012-06-15 NOTE — Assessment & Plan Note (Signed)
stable overall by hx and exam, most recent data reviewed with pt, and pt to continue medical treatment as before BP Readings from Last 3 Encounters:  06/11/12 102/66  06/03/12 99/55  05/28/12 102/68

## 2012-06-15 NOTE — Progress Notes (Signed)
Subjective:    Patient ID: Christine [name], female    DOB: 02/09/1931, 78 y.o.   MRN: [id number]  HPI  Here to f/u; overall doing ok,  Pt denies chest pain, increased sob or doe, wheezing, orthopnea, PND, increased LE swelling, palpitations, dizziness or syncope.  Pt denies new neurological symptoms such as new headache, or facial or extremity weakness or numbness   Pt denies polydipsia, polyuria, or low sugar symptoms such as weakness or confusion improved with po intake.  Pt states overall good compliance with meds, trying to follow lower cholesterol diet, wt overall stable but little exercise however.  Klonopin helping with anxiety, much improved, Denies worsening depressive symptoms, suicidal ideation, or panic.  No overt bleeding or bruising.  Wt at home 245, does not want to do wt here today.   Pt denies fever, wt loss, night sweats, loss of appetite, or other constitutional symptoms   Past Medical History  Diagnosis Date  . GERD (gastroesophageal reflux disease)   . Hypertension   . Morbid obesity   . Arthritis   . Chronic kidney disease     nephrolithiasis left kidney,s/p open resection  . Status post arthroscopic surgery of right knee   . Chronic back pain   . Pneumonia 07/2011    hx  . Pulmonary embolism 07/2011    hx  . Uterine cancer     endometrial high grade adenocarcinoma  . Uterus cancer, sarcoma     high grade invasive carcinosarcoma  . Myocardial infarction 1989  . Bronchitis   . H/O cardiomegaly   . Lymphedema 09/29/2011  . Diabetes mellitus, diet controlled 10/03/2011  . Chronic kidney disease, stage II (mild) 09/20/2011  . Acute diastolic congestive heart failure, 2D Oct 2012 09/29/2011  . Presence of IVC filter 01/08/2012  . History of colon polyps 01/08/2012  . Chest pain 01/16/2012  . Aortic stenosis, moderate 01/16/2012  . PAF (paroxysmal atrial fibrillation), 01/18/12 01/18/2012  . CAD (coronary artery disease) 03/14/2012  . Nephrolithiasis 03/14/2012  .  Diverticulosis 03/14/2012  . Allergic rhinitis, cause unspecified 05/04/2012   Past Surgical History  Procedure Date  . Colonoscopy     multiple  . Knee arthroscopy     right knee  . Cataract extraction, bilateral   . Tubal ligation 1973  . Abdominal hysterectomy 10/05/2008    total with b/l salpingo-oopherectomy  . Kidney stone removal   . Open left knee cartilage repair   . Left index finger surgery     reports that she has never smoked. She has never used smokeless tobacco. She reports that she does not drink alcohol or use illicit drugs. family history includes Breast cancer in her sister; Cervical cancer in her sister; and Colon cancer in her sister. No Known Allergies Current Outpatient Prescriptions on File Prior to Visit  Medication Sig Dispense Refill  . albuterol (PROVENTIL HFA;VENTOLIN HFA) 108 (90 BASE) MCG/ACT inhaler Inhale 2 puffs into the lungs every 6 (six) hours as needed. For shortness of breath      . aspirin EC 81 MG tablet Take 81 mg by mouth daily.      . cholecalciferol (VITAMIN D) 1000 UNITS tablet Take 2,000 Units by mouth daily.       [name] docusate sodium (COLACE) 100 MG capsule Take 200 mg by mouth 2 (two) times daily. For diarrhea      . dronedarone (MULTAQ) 400 MG tablet Take 400 mg by mouth 2 (two) times daily with a meal.      .  ezetimibe (ZETIA) 10 MG tablet Take 10 mg by mouth daily.       . furosemide (LASIX) 40 MG tablet Take 2 tablets (80 mg total) by mouth 2 (two) times daily.  120 tablet  1  . metoprolol succinate (TOPROL-XL) 25 MG 24 hr tablet Take 25 mg by mouth daily.      . nitroGLYCERIN (NITROSTAT) 0.4 MG SL tablet Place 0.4 mg under the tongue every 5 (five) minutes as needed. For chest pain. Do not exceed 3 tablets. If pain is not resolved after 3rd tablet call 911.      . Nutritional Supplements (ENSURE PO) Take 1 each by mouth 2 (two) times daily as needed. For supplement      . omeprazole (PRILOSEC) 20 MG capsule Take 20 mg by mouth daily.       . polyethylene glycol (MIRALAX / GLYCOLAX) packet Take 17 g by mouth daily.      [name] senna (SENOKOT) 8.6 MG TABS Take 1 tablet by mouth.      . zolpidem (AMBIEN) 5 MG tablet Take 5-10 mg by mouth at bedtime as needed. For sleep.       Review of Systems Constitutional: Negative for diaphoresis and unexpected weight change.  HENT: Negative for drooling and tinnitus.   Eyes: Negative for photophobia and visual disturbance.  Respiratory: Negative for cough or wheezing Gastrointestinal: Negative for vomiting and blood in stool.  Genitourinary: Negative for hematuria and decreased urine volume.  Musculoskeletal: Negative for acute joint swelling Skin: Negative for color change and wound.  Neurological: Negative for tremors and numbness.  Psychiatric/Behavioral: Negative for decreased concentration. The patient is not hyperactive.      Objective:   Physical Exam BP 102/66  Pulse 81  Temp 97.6 F (36.4 C) (Oral)  SpO2 98% Physical Exam  VS noted Constitutional: Pt appears well-developed and well-nourished.  HENT: Head: Normocephalic.  Right Ear: External ear normal.  Left Ear: External ear normal.  Eyes: Conjunctivae and EOM are normal. Pupils are equal, round, and reactive to light.  Neck: Normal range of motion. Neck supple.  Cardiovascular: Normal rate and regular rhythm.   Pulmonary/Chest: Effort normal and breath sounds normal.  Abd:  Soft, NT, non-distended, + BS Neurological: Pt is alert..  Skin: Skin is warm. No erythema. No rash Psychiatric: Pt behavior is normal. Thought content normal.     Assessment & Plan:

## 2012-06-15 NOTE — Assessment & Plan Note (Signed)
For f/u cbc today with ? Of recent GI blood loss , no overt bleeding or bruising, ? Related to renal - I think might benefit from aranesp, to f/u with renal

## 2012-06-15 NOTE — Assessment & Plan Note (Signed)
For f/u cr today, Continue all other medications as before

## 2012-06-24 ENCOUNTER — Telehealth: Payer: Self-pay

## 2012-06-24 MED ORDER — HYDROCODONE-ACETAMINOPHEN 10-325 MG PO TABS: 1.0000 | ORAL_TABLET | Freq: Four times a day (QID) | ORAL | Status: DC | PRN

## 2012-06-24 NOTE — Telephone Encounter (Signed)
Ok but should have OV if any swelling such as gout or infection or trauma

## 2012-06-24 NOTE — Telephone Encounter (Signed)
[facility] informed of MD instructions.

## 2012-06-24 NOTE — Telephone Encounter (Signed)
Consider UC or ER for uncontrolled pain today

## 2012-06-24 NOTE — Telephone Encounter (Signed)
Faxed hardcopy to pharmacy per daughter request [facility] [address].  Also informed of MD instructions on hand, the daughter stated the hand is swollen and red she did schedule appt. With Dr. Jonny Ruiz.

## 2012-06-24 NOTE — Telephone Encounter (Signed)
[facility] Catering manager) called to inform patients pain medication ( Hydrocodone 10/325) is not working for her hand pain.  [facility] stated the patients pain was at a 10 today. She still has a crackle in her lower lobe.  Her weight is down to 241 today and was 246 last week.  Please advise [facility] call back number is [phone] .  Patient has appointment with Dr. Jonny Ruiz tomorrow  06/25/12.

## 2012-06-24 NOTE — Telephone Encounter (Signed)
Joni Reining the patients daughter called to inform the patient is having severe hand pain, crying non stop due to pain.  Stated she has taken all the hyrdocodone sent in in august.  The daughter is requesting advisement and/or rx sent to pharmacy.  Please advise call back number for Joni Reining is [phone]

## 2012-06-25 ENCOUNTER — Ambulatory Visit (INDEPENDENT_AMBULATORY_CARE_PROVIDER_SITE_OTHER): Payer: Medicare Other | Admitting: Internal Medicine

## 2012-06-25 ENCOUNTER — Encounter: Payer: Self-pay | Admitting: Internal Medicine

## 2012-06-25 VITALS — BP 102/62 | HR 102 | Temp 98.4°F | Wt 241.0 lb

## 2012-06-25 DIAGNOSIS — M109 Gout, unspecified: Secondary | ICD-10-CM | POA: Insufficient documentation

## 2012-06-25 DIAGNOSIS — Z23 Encounter for immunization: Secondary | ICD-10-CM

## 2012-06-25 DIAGNOSIS — I10 Essential (primary) hypertension: Secondary | ICD-10-CM

## 2012-06-25 HISTORY — DX: Gout, unspecified: M10.9

## 2012-06-25 MED ORDER — METHYLPREDNISOLONE ACETATE 80 MG/ML IJ SUSP
120.0000 mg | Freq: Once | INTRAMUSCULAR | Status: AC
  Administered 2012-06-25: 120 mg via INTRAMUSCULAR

## 2012-06-25 MED ORDER — PREDNISONE 10 MG PO TABS: ORAL_TABLET | ORAL | Status: DC

## 2012-06-25 NOTE — Progress Notes (Signed)
Subjective:    Patient ID: Christine [name], female    DOB: 1931-08-19, 78 y.o.   MRN: [id number]  HPI  Here with 4-5 days severe pain, swelling to the right hand, occurred suddenly, some slight improved since then, better with the hydrocodone but just not improving well enough; no fever, trauma; does have hx of gout to right toe when hospd at one point- better with tx but cant remember what it was;  Has hx of ongoing chronic bilat hand pain followed per hand surgeon of unclear etiology - ? Vascular/neuritic (has had EMG/NCS) but not had the swelling in the past. Pt denies chest pain, increased sob or doe, wheezing, orthopnea, PND, increased LE swelling, palpitations, dizziness or syncope.  Pt denies new neurological symptoms such as new headache, or facial or extremity weakness or numbness   Pt denies polydipsia, polyuria  Due for flu shot  Past Medical History  Diagnosis Date  . GERD (gastroesophageal reflux disease)   . Hypertension   . Morbid obesity   . Arthritis   . Chronic kidney disease     nephrolithiasis left kidney,s/p open resection  . Status post arthroscopic surgery of right knee   . Chronic back pain   . Pneumonia 07/2011    hx  . Pulmonary embolism 07/2011    hx  . Uterine cancer     endometrial high grade adenocarcinoma  . Uterus cancer, sarcoma     high grade invasive carcinosarcoma  . Myocardial infarction 1989  . Bronchitis   . H/O cardiomegaly   . Lymphedema 09/29/2011  . Diabetes mellitus, diet controlled 10/03/2011  . Chronic kidney disease, stage II (mild) 09/20/2011  . Acute diastolic congestive heart failure, 2D Oct 2012 09/29/2011  . Presence of IVC filter 01/08/2012  . History of colon polyps 01/08/2012  . Chest pain 01/16/2012  . Aortic stenosis, moderate 01/16/2012  . PAF (paroxysmal atrial fibrillation), 01/18/12 01/18/2012  . CAD (coronary artery disease) 03/14/2012  . Nephrolithiasis 03/14/2012  . Diverticulosis 03/14/2012  . Allergic rhinitis, cause  unspecified 05/04/2012   Past Surgical History  Procedure Date  . Colonoscopy     multiple  . Knee arthroscopy     right knee  . Cataract extraction, bilateral   . Tubal ligation 1973  . Abdominal hysterectomy 10/05/2008    total with b/l salpingo-oopherectomy  . Kidney stone removal   . Open left knee cartilage repair   . Left index finger surgery     reports that she has never smoked. She has never used smokeless tobacco. She reports that she does not drink alcohol or use illicit drugs. family history includes Breast cancer in her sister; Cervical cancer in her sister; and Colon cancer in her sister. No Known Allergies Current Outpatient Prescriptions on File Prior to Visit  Medication Sig Dispense Refill  . albuterol (PROVENTIL HFA;VENTOLIN HFA) 108 (90 BASE) MCG/ACT inhaler Inhale 2 puffs into the lungs every 6 (six) hours as needed. For shortness of breath      . aspirin EC 81 MG tablet Take 81 mg by mouth daily.      . cholecalciferol (VITAMIN D) 1000 UNITS tablet Take 2,000 Units by mouth daily.       [name] docusate sodium (COLACE) 100 MG capsule Take 200 mg by mouth 2 (two) times daily. For diarrhea      . dronedarone (MULTAQ) 400 MG tablet Take 400 mg by mouth 2 (two) times daily with a meal.      .  ezetimibe (ZETIA) 10 MG tablet Take 10 mg by mouth daily.       . furosemide (LASIX) 40 MG tablet Take 2 tablets (80 mg total) by mouth 2 (two) times daily.  120 tablet  1  . HYDROcodone-acetaminophen (NORCO) 10-325 MG per tablet Take 1 tablet by mouth every 6 (six) hours as needed. For pain  60 tablet  0  . metoprolol succinate (TOPROL-XL) 25 MG 24 hr tablet Take 25 mg by mouth daily.      . nitroGLYCERIN (NITROSTAT) 0.4 MG SL tablet Place 0.4 mg under the tongue every 5 (five) minutes as needed. For chest pain. Do not exceed 3 tablets. If pain is not resolved after 3rd tablet call 911.      . Nutritional Supplements (ENSURE PO) Take 1 each by mouth 2 (two) times daily as needed. For  supplement      . omeprazole (PRILOSEC) 20 MG capsule Take 20 mg by mouth daily.      . polyethylene glycol (MIRALAX / GLYCOLAX) packet Take 17 g by mouth daily.      [name] senna (SENOKOT) 8.6 MG TABS Take 1 tablet by mouth.      . zolpidem (AMBIEN) 5 MG tablet Take 5-10 mg by mouth at bedtime as needed. For sleep.       No current facility-administered medications on file prior to visit.   Current Outpatient Prescriptions on File Prior to Visit  Medication Sig Dispense Refill  . albuterol (PROVENTIL HFA;VENTOLIN HFA) 108 (90 BASE) MCG/ACT inhaler Inhale 2 puffs into the lungs every 6 (six) hours as needed. For shortness of breath      . aspirin EC 81 MG tablet Take 81 mg by mouth daily.      . cholecalciferol (VITAMIN D) 1000 UNITS tablet Take 2,000 Units by mouth daily.       [name] docusate sodium (COLACE) 100 MG capsule Take 200 mg by mouth 2 (two) times daily. For diarrhea      . dronedarone (MULTAQ) 400 MG tablet Take 400 mg by mouth 2 (two) times daily with a meal.      . ezetimibe (ZETIA) 10 MG tablet Take 10 mg by mouth daily.       . furosemide (LASIX) 40 MG tablet Take 2 tablets (80 mg total) by mouth 2 (two) times daily.  120 tablet  1  . HYDROcodone-acetaminophen (NORCO) 10-325 MG per tablet Take 1 tablet by mouth every 6 (six) hours as needed. For pain  60 tablet  0  . metoprolol succinate (TOPROL-XL) 25 MG 24 hr tablet Take 25 mg by mouth daily.      . nitroGLYCERIN (NITROSTAT) 0.4 MG SL tablet Place 0.4 mg under the tongue every 5 (five) minutes as needed. For chest pain. Do not exceed 3 tablets. If pain is not resolved after 3rd tablet call 911.      . Nutritional Supplements (ENSURE PO) Take 1 each by mouth 2 (two) times daily as needed. For supplement      . omeprazole (PRILOSEC) 20 MG capsule Take 20 mg by mouth daily.      . polyethylene glycol (MIRALAX / GLYCOLAX) packet Take 17 g by mouth daily.      [name] senna (SENOKOT) 8.6 MG TABS Take 1 tablet by mouth.      . zolpidem (AMBIEN)  5 MG tablet Take 5-10 mg by mouth at bedtime as needed. For sleep.       No current facility-administered medications on file prior  to visit.   Review of Systems Constitutional: Negative for diaphoresis and unexpected weight change.  HENT: Negative tinnitus.   Eyes: Negative for photophobia and visual disturbance.  Respiratory: Negative for choking and stridor.   Gastrointestinal: Negative for vomiting and blood in stool.  Genitourinary: Negative for hematuria and decreased urine volume.  Skin: Negative for color change and wound.  Neurological: Negative for tremors and numbness.     Objective:   Physical Exam BP 102/62  Pulse 102  Temp 98.4 F (36.9 C) (Oral)  Wt 241 lb (109.317 kg)  SpO2 96% Physical Exam  VS noted Constitutional: Pt appears well-developed and well-nourished.  HENT: Head: Normocephalic.  Right Ear: External ear normal.  Left Ear: External ear normal.  Eyes: Conjunctivae and EOM are normal. Pupils are equal, round, and reactive to light.  Neck: Normal range of motion. Neck supple.  Cardiovascular: Normal rate and regular rhythm.   Pulmonary/Chest: Effort normal and breath sounds normal.  Neurological: Pt is alert. Not confused Skin: Skin is warm. No erythema. Right hand with all MCP's with 2+ swelling/tender, left hand not swollen Psychiatric: Pt behavior is normal. Thought content normal.     Assessment & Plan:

## 2012-06-25 NOTE — Assessment & Plan Note (Signed)
stable overall by hx and exam, most recent data reviewed with pt, and pt to continue medical treatment as before BP Readings from Last 3 Encounters:  06/25/12 102/62  06/11/12 102/66  06/03/12 99/55

## 2012-06-25 NOTE — Assessment & Plan Note (Signed)
Most likely dx given the asymmetric nature, to consider RA but for now will tx with depoemedrol IM, and predpack asd,, cont pain med as before, for uric acid with next blood draw, consider allopurinol but declines today to avoid polypharmacy

## 2012-06-25 NOTE — Assessment & Plan Note (Signed)
stable overall by hx and exam, most recent data reviewed with pt, and pt to continue medical treatment as before Lab Results  Component Value Date   HGBA1C 5.4 05/30/2012   To call for onset polys or cbg > 200

## 2012-06-25 NOTE — Patient Instructions (Addendum)
You had the steroid shot today Take all new medications as prescribed, including the pain medication if needed, and the prednisone Continue all other medications as before We can cancel the Dec 2013 appt (I will let the schedulers know)

## 2012-06-30 ENCOUNTER — Ambulatory Visit: Payer: Medicare Other | Admitting: Radiation Oncology

## 2012-07-08 ENCOUNTER — Encounter: Payer: Self-pay | Admitting: Radiation Oncology

## 2012-07-09 ENCOUNTER — Other Ambulatory Visit (HOSPITAL_COMMUNITY)
Admission: RE | Admit: 2012-07-09 | Discharge: 2012-07-09 | Disposition: A | Discharge status: Home / Self Care | Payer: Medicare Other | Source: Ambulatory Visit | Attending: Radiation Oncology | Admitting: Radiation Oncology

## 2012-07-09 ENCOUNTER — Encounter: Payer: Self-pay | Admitting: Radiation Oncology

## 2012-07-09 ENCOUNTER — Ambulatory Visit: Payer: Medicare Other | Admitting: Internal Medicine

## 2012-07-09 ENCOUNTER — Ambulatory Visit
Admission: RE | Admit: 2012-07-09 | Discharge: 2012-07-09 | Disposition: A | Discharge status: Home / Self Care | Payer: Medicare Other | Source: Ambulatory Visit | Attending: Radiation Oncology | Admitting: Radiation Oncology

## 2012-07-09 VITALS — BP 94/46 | HR 82 | Temp 98.7°F | Wt 249.0 lb

## 2012-07-09 DIAGNOSIS — Z124 Encounter for screening for malignant neoplasm of cervix: Secondary | ICD-10-CM | POA: Insufficient documentation

## 2012-07-09 DIAGNOSIS — Z79899 Other long term (current) drug therapy: Secondary | ICD-10-CM | POA: Insufficient documentation

## 2012-07-09 DIAGNOSIS — Z7982 Long term (current) use of aspirin: Secondary | ICD-10-CM | POA: Insufficient documentation

## 2012-07-09 DIAGNOSIS — C55 Malignant neoplasm of uterus, part unspecified: Secondary | ICD-10-CM | POA: Insufficient documentation

## 2012-07-09 NOTE — Progress Notes (Signed)
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  She did see Dr. Duard Brady in the spring and received a good report. She denies any vaginal bleeding pelvic or low back pain at this time. She denies any hematuria or rectal bleeding.                             ALLERGIES:   has no known allergies.  Meds: Current Outpatient Prescriptions  Medication Sig Dispense Refill  . fexofenadine (ALLEGRA) 60 MG tablet Take 60 mg by mouth daily.      [name] albuterol (PROVENTIL HFA;VENTOLIN HFA) 108 (90 BASE) MCG/ACT inhaler Inhale 2 puffs into the lungs every 6 (six) hours as needed. For shortness of breath      . aspirin EC 81 MG tablet Take 81 mg by mouth daily.      . cholecalciferol (VITAMIN D) 1000 UNITS tablet Take 2,000 Units by mouth daily.       [name] docusate sodium (COLACE) 100 MG capsule Take 200 mg by mouth 2 (two) times daily. For diarrhea      . dronedarone (MULTAQ) 400 MG tablet Take 400 mg by mouth 2 (two) times daily with a meal.      . ezetimibe (ZETIA) 10 MG tablet Take 10 mg by mouth daily.       . furosemide (LASIX) 40 MG tablet Take 2 tablets (80 mg total) by mouth 2 (two) times daily.  120 tablet  1  . HYDROcodone-acetaminophen (NORCO) 10-325 MG per tablet Take 1 tablet by mouth every 6 (six) hours as needed. For pain  60 tablet  0  . metoprolol succinate (TOPROL-XL) 25 MG 24 hr tablet Take 25 mg by mouth daily.      . nitroGLYCERIN (NITROSTAT) 0.4 MG SL tablet Place 0.4 mg under the tongue every 5 (five) minutes as needed. For chest pain. Do not exceed 3 tablets. If pain is not resolved after 3rd tablet call 911.      . Nutritional Supplements (ENSURE PO) Take 1 each  by mouth 2 (two) times daily as needed. For supplement      . omeprazole (PRILOSEC) 20 MG capsule Take 20 mg by mouth daily.      . polyethylene glycol (MIRALAX / GLYCOLAX) packet Take 17 g by mouth daily.      [name] senna (SENOKOT) 8.6 MG TABS Take 1 tablet by mouth.      . zolpidem (AMBIEN) 5 MG tablet Take 5-10 mg by mouth at bedtime as needed. For sleep.        Physical Findings: The patient is in no acute distress. Patient is alert and oriented.  weight is 249 lb (112.946 kg). Her temperature is 98.7 F (37.1 C). Her blood pressure is 94/46 and her pulse is 82. Her oxygen saturation is 96%. .  No palpable cervical supraclavicular or axillary adenopathy. The lungs are clear to auscultation. The heart has regular rhythm and rate. The abdomen is soft and nontender with normal bowel sounds. There is no inguinal adenopathy appreciated on pelvic examination. the external genitalia are unremarkable. A speculum  exam is performed. There is some radiation changes noted in the proximal vagina. A Pap smear was obtained of the proximal vagina. There are no mucosal lesions noted. Bimanual examination is performed without any obvious pelvic masses although exam is compromised in light of the patient's body habitus.  Lab Findings: Lab Results  Component Value Date   WBC 18.3* 06/11/2012   HGB 9.4* 06/11/2012   HCT 29.8* 06/11/2012   MCV 91.9 06/11/2012   PLT 522.0* 06/11/2012    @LASTCHEM @  Radiographic Findings: No results found.  Impression:  No evidence of recurrence on clinical exam today, Pap smear pending  Plan:  Routine followup in one year. In the interim the patient will be seen in gynecologic oncology.  _____________________________________    Billie Lade, PhD, MD

## 2012-07-23 ENCOUNTER — Ambulatory Visit: Payer: Medicare Other | Admitting: Internal Medicine

## 2012-07-24 ENCOUNTER — Encounter (HOSPITAL_COMMUNITY): Payer: Self-pay | Admitting: Cardiology

## 2012-07-24 ENCOUNTER — Emergency Department (HOSPITAL_COMMUNITY): Payer: Medicare Other

## 2012-07-24 ENCOUNTER — Inpatient Hospital Stay (HOSPITAL_COMMUNITY)
Admission: EM | Admit: 2012-07-24 | Discharge: 2012-08-12 | DRG: 291 | Disposition: A | Discharge status: Skilled Nursing Facility | Payer: Medicare Other | Attending: Internal Medicine | Admitting: Internal Medicine

## 2012-07-24 DIAGNOSIS — I313 Pericardial effusion (noninflammatory): Secondary | ICD-10-CM | POA: Diagnosis not present

## 2012-07-24 DIAGNOSIS — I509 Heart failure, unspecified: Secondary | ICD-10-CM | POA: Diagnosis present

## 2012-07-24 DIAGNOSIS — I319 Disease of pericardium, unspecified: Secondary | ICD-10-CM | POA: Diagnosis present

## 2012-07-24 DIAGNOSIS — I480 Paroxysmal atrial fibrillation: Secondary | ICD-10-CM | POA: Diagnosis present

## 2012-07-24 DIAGNOSIS — Z8719 Personal history of other diseases of the digestive system: Secondary | ICD-10-CM

## 2012-07-24 DIAGNOSIS — I498 Other specified cardiac arrhythmias: Secondary | ICD-10-CM | POA: Diagnosis present

## 2012-07-24 DIAGNOSIS — N184 Chronic kidney disease, stage 4 (severe): Secondary | ICD-10-CM | POA: Diagnosis present

## 2012-07-24 DIAGNOSIS — I5033 Acute on chronic diastolic (congestive) heart failure: Principal | ICD-10-CM | POA: Diagnosis present

## 2012-07-24 DIAGNOSIS — K219 Gastro-esophageal reflux disease without esophagitis: Secondary | ICD-10-CM | POA: Diagnosis present

## 2012-07-24 DIAGNOSIS — R319 Hematuria, unspecified: Secondary | ICD-10-CM | POA: Diagnosis present

## 2012-07-24 DIAGNOSIS — E119 Type 2 diabetes mellitus without complications: Secondary | ICD-10-CM | POA: Diagnosis present

## 2012-07-24 DIAGNOSIS — I5032 Chronic diastolic (congestive) heart failure: Secondary | ICD-10-CM | POA: Diagnosis present

## 2012-07-24 DIAGNOSIS — N390 Urinary tract infection, site not specified: Secondary | ICD-10-CM | POA: Diagnosis present

## 2012-07-24 DIAGNOSIS — E875 Hyperkalemia: Secondary | ICD-10-CM | POA: Diagnosis present

## 2012-07-24 DIAGNOSIS — Z95828 Presence of other vascular implants and grafts: Secondary | ICD-10-CM

## 2012-07-24 DIAGNOSIS — N179 Acute kidney failure, unspecified: Secondary | ICD-10-CM | POA: Diagnosis present

## 2012-07-24 DIAGNOSIS — I959 Hypotension, unspecified: Secondary | ICD-10-CM

## 2012-07-24 DIAGNOSIS — I359 Nonrheumatic aortic valve disorder, unspecified: Secondary | ICD-10-CM | POA: Diagnosis present

## 2012-07-24 DIAGNOSIS — I252 Old myocardial infarction: Secondary | ICD-10-CM

## 2012-07-24 DIAGNOSIS — D649 Anemia, unspecified: Secondary | ICD-10-CM | POA: Diagnosis present

## 2012-07-24 DIAGNOSIS — J9600 Acute respiratory failure, unspecified whether with hypoxia or hypercapnia: Secondary | ICD-10-CM

## 2012-07-24 DIAGNOSIS — I4891 Unspecified atrial fibrillation: Secondary | ICD-10-CM | POA: Diagnosis present

## 2012-07-24 DIAGNOSIS — Z8542 Personal history of malignant neoplasm of other parts of uterus: Secondary | ICD-10-CM

## 2012-07-24 DIAGNOSIS — I471 Supraventricular tachycardia, unspecified: Secondary | ICD-10-CM

## 2012-07-24 DIAGNOSIS — I129 Hypertensive chronic kidney disease with stage 1 through stage 4 chronic kidney disease, or unspecified chronic kidney disease: Secondary | ICD-10-CM | POA: Diagnosis present

## 2012-07-24 DIAGNOSIS — I2510 Atherosclerotic heart disease of native coronary artery without angina pectoris: Secondary | ICD-10-CM | POA: Diagnosis present

## 2012-07-24 DIAGNOSIS — M129 Arthropathy, unspecified: Secondary | ICD-10-CM | POA: Diagnosis present

## 2012-07-24 DIAGNOSIS — Z860100 Personal history of colon polyps, unspecified: Secondary | ICD-10-CM

## 2012-07-24 DIAGNOSIS — Z66 Do not resuscitate: Secondary | ICD-10-CM | POA: Diagnosis not present

## 2012-07-24 DIAGNOSIS — Z86711 Personal history of pulmonary embolism: Secondary | ICD-10-CM

## 2012-07-24 DIAGNOSIS — I350 Nonrheumatic aortic (valve) stenosis: Secondary | ICD-10-CM | POA: Diagnosis present

## 2012-07-24 DIAGNOSIS — N170 Acute kidney failure with tubular necrosis: Secondary | ICD-10-CM | POA: Diagnosis present

## 2012-07-24 DIAGNOSIS — I10 Essential (primary) hypertension: Secondary | ICD-10-CM | POA: Diagnosis present

## 2012-07-24 DIAGNOSIS — Z7982 Long term (current) use of aspirin: Secondary | ICD-10-CM

## 2012-07-24 DIAGNOSIS — R0602 Shortness of breath: Secondary | ICD-10-CM

## 2012-07-24 DIAGNOSIS — I2699 Other pulmonary embolism without acute cor pulmonale: Secondary | ICD-10-CM | POA: Diagnosis present

## 2012-07-24 DIAGNOSIS — R5381 Other malaise: Secondary | ICD-10-CM | POA: Diagnosis present

## 2012-07-24 DIAGNOSIS — M109 Gout, unspecified: Secondary | ICD-10-CM | POA: Diagnosis present

## 2012-07-24 DIAGNOSIS — I890 Lymphedema, not elsewhere classified: Secondary | ICD-10-CM | POA: Diagnosis present

## 2012-07-24 HISTORY — DX: Supraventricular tachycardia: I47.1

## 2012-07-24 HISTORY — DX: Hypotension, unspecified: I95.9

## 2012-07-24 HISTORY — DX: Supraventricular tachycardia, unspecified: I47.10

## 2012-07-24 HISTORY — DX: Pericardial effusion (noninflammatory): I31.3

## 2012-07-24 HISTORY — DX: Shortness of breath: R06.02

## 2012-07-24 HISTORY — DX: Acute respiratory failure, unspecified whether with hypoxia or hypercapnia: J96.00

## 2012-07-24 LAB — URINE MICROSCOPIC-ADD ON

## 2012-07-24 LAB — POCT I-STAT, CHEM 8
Chloride: 99 mEq/L (ref 96–112)
HCT: 31 % — ABNORMAL LOW (ref 36.0–46.0)
Hemoglobin: 10.5 g/dL — ABNORMAL LOW (ref 12.0–15.0)
Potassium: 3.7 mEq/L (ref 3.5–5.1)
Sodium: 137 mEq/L (ref 135–145)

## 2012-07-24 LAB — CK TOTAL AND CKMB (NOT AT ARMC): CK, MB: 2.6 ng/mL (ref 0.3–4.0)

## 2012-07-24 LAB — CBC WITH DIFFERENTIAL/PLATELET
Basophils Relative: 0 % (ref 0–1)
Eosinophils Absolute: 0.2 10*3/uL (ref 0.0–0.7)
Hemoglobin: 9.7 g/dL — ABNORMAL LOW (ref 12.0–15.0)
Lymphs Abs: 2.7 10*3/uL (ref 0.7–4.0)
MCH: 29.6 pg (ref 26.0–34.0)
Monocytes Relative: 10 % (ref 3–12)
Neutro Abs: 12.8 10*3/uL — ABNORMAL HIGH (ref 1.7–7.7)
Neutrophils Relative %: 73 % (ref 43–77)
Platelets: 587 10*3/uL — ABNORMAL HIGH (ref 150–400)
RBC: 3.28 MIL/uL — ABNORMAL LOW (ref 3.87–5.11)

## 2012-07-24 LAB — POCT I-STAT 3, ART BLOOD GAS (G3+)
TCO2: 36 mmol/L (ref 0–100)
pCO2 arterial: 45.4 mmHg — ABNORMAL HIGH (ref 35.0–45.0)
pH, Arterial: 7.486 — ABNORMAL HIGH (ref 7.350–7.450)

## 2012-07-24 LAB — COMPREHENSIVE METABOLIC PANEL
ALT: 8 U/L (ref 0–35)
Albumin: 2 g/dL — ABNORMAL LOW (ref 3.5–5.2)
Alkaline Phosphatase: 79 U/L (ref 39–117)
Chloride: 93 mEq/L — ABNORMAL LOW (ref 96–112)
Glucose, Bld: 99 mg/dL (ref 70–99)
Potassium: 3.7 mEq/L (ref 3.5–5.1)
Sodium: 137 mEq/L (ref 135–145)
Total Bilirubin: 0.3 mg/dL (ref 0.3–1.2)
Total Protein: 7.2 g/dL (ref 6.0–8.3)

## 2012-07-24 LAB — PROTIME-INR
INR: 1.22 (ref 0.00–1.49)
Prothrombin Time: 15.2 seconds (ref 11.6–15.2)

## 2012-07-24 LAB — URINALYSIS, ROUTINE W REFLEX MICROSCOPIC
Bilirubin Urine: NEGATIVE
Glucose, UA: NEGATIVE mg/dL
Ketones, ur: NEGATIVE mg/dL
Protein, ur: NEGATIVE mg/dL
pH: 5 (ref 5.0–8.0)

## 2012-07-24 LAB — LACTIC ACID, PLASMA: Lactic Acid, Venous: 4.8 mmol/L — ABNORMAL HIGH (ref 0.5–2.2)

## 2012-07-24 LAB — TROPONIN I
Troponin I: <0.3 ng/mL (ref <0.30)
Troponin I: <0.3 ng/mL (ref <0.30)

## 2012-07-24 LAB — APTT: aPTT: 34 seconds (ref 24–37)

## 2012-07-24 LAB — HEMOGLOBIN A1C: Mean Plasma Glucose: 91 mg/dL (ref <117)

## 2012-07-24 LAB — TSH: TSH: 2.628 u[IU]/mL (ref 0.350–4.500)

## 2012-07-24 LAB — PRO B NATRIURETIC PEPTIDE: Pro B Natriuretic peptide (BNP): 3114 pg/mL — ABNORMAL HIGH (ref 0–450)

## 2012-07-24 LAB — MAGNESIUM: Magnesium: 2.1 mg/dL (ref 1.5–2.5)

## 2012-07-24 MED ORDER — NITROGLYCERIN 0.4 MG SL SUBL: 0.4000 mg | SUBLINGUAL_TABLET | SUBLINGUAL | Status: DC | PRN

## 2012-07-24 MED ORDER — ASPIRIN EC 81 MG PO TBEC
81.0000 mg | DELAYED_RELEASE_TABLET | Freq: Every day | ORAL | Status: DC
  Administered 2012-07-25 – 2012-08-12 (×19): 81 mg via ORAL
  Filled 2012-07-24 (×19): qty 1

## 2012-07-24 MED ORDER — MIDAZOLAM HCL 5 MG/5ML IJ SOLN
INTRAMUSCULAR | Status: AC | PRN
  Administered 2012-07-24: 4 mg via INTRAVENOUS
  Administered 2012-07-24: 2 mg via INTRAVENOUS

## 2012-07-24 MED ORDER — MIDAZOLAM HCL 2 MG/2ML IJ SOLN
INTRAMUSCULAR | Status: AC
  Filled 2012-07-24: qty 2

## 2012-07-24 MED ORDER — HEPARIN (PORCINE) IN NACL 100-0.45 UNIT/ML-% IJ SOLN
1850.0000 [IU]/h | INTRAMUSCULAR | Status: DC
  Administered 2012-07-24: 1350 [IU]/h via INTRAVENOUS
  Administered 2012-07-25: 1850 [IU]/h via INTRAVENOUS
  Administered 2012-07-25: 1700 [IU]/h via INTRAVENOUS
  Filled 2012-07-24 (×5): qty 250

## 2012-07-24 MED ORDER — ASPIRIN 81 MG PO CHEW
324.0000 mg | CHEWABLE_TABLET | ORAL | Status: AC
  Administered 2012-07-24: 324 mg via ORAL
  Filled 2012-07-24: qty 4

## 2012-07-24 MED ORDER — ALBUTEROL SULFATE HFA 108 (90 BASE) MCG/ACT IN AERS
2.0000 | INHALATION_SPRAY | Freq: Four times a day (QID) | RESPIRATORY_TRACT | Status: DC | PRN
  Administered 2012-07-29: 2 via RESPIRATORY_TRACT
  Filled 2012-07-24: qty 6.7

## 2012-07-24 MED ORDER — ASPIRIN EC 81 MG PO TBEC: 81.0000 mg | DELAYED_RELEASE_TABLET | Freq: Every day | ORAL | Status: DC

## 2012-07-24 MED ORDER — ONDANSETRON HCL 4 MG/2ML IJ SOLN
4.0000 mg | Freq: Four times a day (QID) | INTRAMUSCULAR | Status: DC | PRN
  Administered 2012-08-01 – 2012-08-09 (×4): 4 mg via INTRAVENOUS
  Filled 2012-07-24 (×4): qty 2

## 2012-07-24 MED ORDER — MUPIROCIN 2 % EX OINT
1.0000 "application " | TOPICAL_OINTMENT | Freq: Two times a day (BID) | CUTANEOUS | Status: AC
  Administered 2012-07-24 – 2012-07-29 (×10): 1 via NASAL
  Filled 2012-07-24 (×2): qty 22

## 2012-07-24 MED ORDER — POLYETHYLENE GLYCOL 3350 17 G PO PACK
17.0000 g | PACK | Freq: Two times a day (BID) | ORAL | Status: DC
  Administered 2012-07-25 – 2012-08-10 (×20): 17 g via ORAL
  Filled 2012-07-24 (×40): qty 1

## 2012-07-24 MED ORDER — CHLORHEXIDINE GLUCONATE CLOTH 2 % EX PADS
6.0000 | MEDICATED_PAD | Freq: Every day | CUTANEOUS | Status: AC
  Administered 2012-07-25 – 2012-07-29 (×4): 6 via TOPICAL

## 2012-07-24 MED ORDER — SENNA 8.6 MG PO TABS
1.0000 | ORAL_TABLET | Freq: Every day | ORAL | Status: DC
  Administered 2012-07-24 – 2012-08-10 (×16): 8.6 mg via ORAL
  Filled 2012-07-24 (×20): qty 1

## 2012-07-24 MED ORDER — ENSURE COMPLETE PO LIQD
237.0000 mL | Freq: Two times a day (BID) | ORAL | Status: DC
  Administered 2012-07-24 – 2012-08-12 (×35): 237 mL via ORAL

## 2012-07-24 MED ORDER — DEXTROSE 5 % IV SOLN
150.0000 mg | INTRAVENOUS | Status: AC | PRN
  Administered 2012-07-24: 150 mg via INTRAVENOUS

## 2012-07-24 MED ORDER — LORATADINE 10 MG PO TABS
10.0000 mg | ORAL_TABLET | Freq: Every day | ORAL | Status: DC
  Administered 2012-07-24 – 2012-08-12 (×20): 10 mg via ORAL
  Filled 2012-07-24 (×20): qty 1

## 2012-07-24 MED ORDER — EZETIMIBE 10 MG PO TABS
10.0000 mg | ORAL_TABLET | Freq: Every day | ORAL | Status: DC
  Administered 2012-07-24 – 2012-08-12 (×20): 10 mg via ORAL
  Filled 2012-07-24 (×20): qty 1

## 2012-07-24 MED ORDER — HEPARIN BOLUS VIA INFUSION
4000.0000 [IU] | Freq: Once | INTRAVENOUS | Status: AC
  Administered 2012-07-24: 4000 [IU] via INTRAVENOUS
  Filled 2012-07-24: qty 4000

## 2012-07-24 MED ORDER — ACETAMINOPHEN 325 MG PO TABS
650.0000 mg | ORAL_TABLET | ORAL | Status: DC | PRN
  Administered 2012-07-24 – 2012-08-02 (×6): 650 mg via ORAL
  Filled 2012-07-24 (×6): qty 2

## 2012-07-24 MED ORDER — VITAMIN D3 25 MCG (1000 UNIT) PO TABS
2000.0000 [IU] | ORAL_TABLET | Freq: Every day | ORAL | Status: DC
  Administered 2012-07-24 – 2012-08-11 (×19): 2000 [IU] via ORAL
  Filled 2012-07-24 (×19): qty 2

## 2012-07-24 MED ORDER — AMIODARONE HCL IN DEXTROSE 360-4.14 MG/200ML-% IV SOLN
60.0000 mg/h | INTRAVENOUS | Status: DC
  Administered 2012-07-24 – 2012-07-25 (×4): 60 mg/h via INTRAVENOUS
  Filled 2012-07-24 (×9): qty 200

## 2012-07-24 MED ORDER — ACETAMINOPHEN 500 MG PO TABS: 500.0000 mg | ORAL_TABLET | Freq: Four times a day (QID) | ORAL | Status: DC | PRN

## 2012-07-24 MED ORDER — ZOLPIDEM TARTRATE 5 MG PO TABS
5.0000 mg | ORAL_TABLET | Freq: Every evening | ORAL | Status: DC | PRN
  Administered 2012-07-26: 5 mg via ORAL
  Filled 2012-07-24: qty 1

## 2012-07-24 MED ORDER — DOCUSATE SODIUM 100 MG PO CAPS
200.0000 mg | ORAL_CAPSULE | Freq: Two times a day (BID) | ORAL | Status: DC
  Administered 2012-07-25 – 2012-08-10 (×27): 200 mg via ORAL
  Filled 2012-07-24 (×39): qty 2

## 2012-07-24 MED ORDER — SODIUM CHLORIDE 0.9 % IV SOLN
INTRAVENOUS | Status: DC
  Administered 2012-07-24: 10 mL via INTRAVENOUS
  Administered 2012-07-25: 04:00:00 via INTRAVENOUS
  Administered 2012-07-27: 10 mL/h via INTRAVENOUS
  Administered 2012-07-29: 07:00:00 via INTRAVENOUS

## 2012-07-24 MED ORDER — SODIUM CHLORIDE 0.9 % IV BOLUS (SEPSIS)
250.0000 mL | Freq: Once | INTRAVENOUS | Status: AC
  Administered 2012-07-24: 250 mL via INTRAVENOUS

## 2012-07-24 MED ORDER — ASPIRIN 300 MG RE SUPP
300.0000 mg | RECTAL | Status: AC
  Filled 2012-07-24: qty 1

## 2012-07-24 MED ORDER — METOPROLOL SUCCINATE ER 25 MG PO TB24
25.0000 mg | ORAL_TABLET | Freq: Every day | ORAL | Status: DC
  Administered 2012-07-24 – 2012-08-11 (×18): 25 mg via ORAL
  Filled 2012-07-24 (×19): qty 1

## 2012-07-24 MED ORDER — PANTOPRAZOLE SODIUM 40 MG PO TBEC
40.0000 mg | DELAYED_RELEASE_TABLET | Freq: Every day | ORAL | Status: DC
  Administered 2012-07-24 – 2012-08-09 (×16): 40 mg via ORAL
  Filled 2012-07-24 (×16): qty 1

## 2012-07-24 MED ORDER — ALUM & MAG HYDROXIDE-SIMETH 200-200-20 MG/5ML PO SUSP
30.0000 mL | ORAL | Status: DC | PRN
  Administered 2012-07-24: 30 mL via ORAL
  Filled 2012-07-24: qty 30

## 2012-07-24 MED ORDER — ADENOSINE 6 MG/2ML IV SOLN
INTRAVENOUS | Status: AC | PRN
  Administered 2012-07-24: 6 mg via INTRAVENOUS

## 2012-07-24 MED ORDER — SODIUM CHLORIDE 0.9 % IV SOLN
INTRAVENOUS | Status: DC
  Administered 2012-07-25: 04:00:00 via INTRAVENOUS

## 2012-07-24 MED ORDER — HYDROCODONE-ACETAMINOPHEN 5-325 MG PO TABS
1.0000 | ORAL_TABLET | Freq: Three times a day (TID) | ORAL | Status: DC | PRN
  Administered 2012-07-24 – 2012-08-03 (×21): 1 via ORAL
  Filled 2012-07-24 (×23): qty 1

## 2012-07-24 MED ORDER — ENSURE PO LIQD: 1.0000 | Freq: Two times a day (BID) | ORAL | Status: DC

## 2012-07-24 NOTE — H&P (Signed)
Pt. Seen and examined. Agree with the NP/PA-C note as written.  Called to the ER emergently for a well-known 78 yo female with a history of a-fib/SVT in the past, she also has moderate aortic stenosis. On arrival she was in a regular, narrow-complex tachycardia at 200-210 bpm (AVRT vs. Atrial flutter).  She had already received 6 mg IV adenosine and was cardioverted twice at 100 and 150J biphasic in the ER.  She was hypotensive at 70/50 on bipap, but mentating. She had received 4 mg versed prior to her cardioversions. I elected to load her with a 150 mg amiodarone bolus and after 5 minutes noted a small decrease in HR. She then received 2 mg IV versed and was cardioverted at 200J biphasic (new pads were applied and moved to a more suitable location over the atria). She responded to this, converting to a sinus rhythm at 90 bpm.  Blood pressure immediately improved to ~100 systolic. I ordered an amiodarone gtts. Will d/c Multaq. We will admit to the CCU and I expect we can wean her bipap today.  Chrystie Nose, MD, [facility] Attending Cardiologist [facility]

## 2012-07-24 NOTE — ED Provider Notes (Signed)
I saw and evaluated the patient, reviewed the resident's note and I agree with the findings and plan.   Loren Racer, MD 07/24/12 1534

## 2012-07-24 NOTE — Code Documentation (Signed)
Attempting to draw labs.

## 2012-07-24 NOTE — Code Documentation (Signed)
Family at beside. Family given emotional support. 

## 2012-07-24 NOTE — Procedures (Signed)
Arterial Catheter Insertion Procedure Note DAYANNARA PASCAL [id number] 01/04/1931  Procedure: Insertion of Arterial Catheter  Indications: Blood pressure monitoring  Procedure Details Consent: Unable to obtain consent because of altered level of consciousness. Time Out: Verified patient identification, verified procedure, site/side was marked, verified correct patient position, special equipment/implants available, medications/allergies/relevent history reviewed, required imaging and test results available.  Performed  Maximum sterile technique was used including antiseptics, cap, gloves, gown, hand hygiene, mask and sheet. Skin prep: Chlorhexidine; local anesthetic administered 20 gauge catheter was inserted into left radial artery using the Seldinger technique.  Evaluation Blood flow good; BP tracing good. Complications: No apparent complications.   Carrington Clamp 07/24/2012

## 2012-07-24 NOTE — Code Documentation (Signed)
Cardiology to bedside. 

## 2012-07-24 NOTE — ED Notes (Signed)
Versed 4 mg ivp given by ben moore rn

## 2012-07-24 NOTE — H&P (Signed)
Christine [name] is an 78 y.o. female.   Cardiologist: Dr. Allyson Sabal  Chief Complaint: SOB and chest discomfort HPI: 78 y.o. female with a past medical history significant for mild CAD by [facility] (performed by myself) in April of 2013. She also has moderate aortic stenosis, atrial fibrillation on Multaq, a history of acute on chronic renal failure and diastolic dysfunction +/- heart failure. She has a history of PE which was in October of 2012 and had supratherapeutic INR with vaginal bleeding on coumadin. An IVC filter was placed. She now presents with worsening shortness of breath occurring today.  On arrival was found to be in SVT,  IV adenosine was given without improvement., BP was dropping IV fluid bolus was given, and BP remained low.  Due to SOB, pt was on BiPap.   Pt was then cardioverted with slowing of a fib but then return to tachycardia.  Dr. Rennis Golden added IV amiodarone bolus and then applied new pads and cardioverted pt with 200 joules, to SR.  BP returned to 100 systolic.     Will begin IV amiodarone drip and d/c her home Multaq. And admit to CCU.  This past week pt's Lasix was increased by Dr. Darrick Penna to 160 mg BID with loss of lower ext edema.  She became weak yesterday and her lasix was held.  Also her allopurinol was held.    Cardiac cath 01/17/12 Impression:  1. Mild proximal tubular RCA stenosis up to 30%, otherwise no significant obstructive CAD  2. The aortic valve did not appear calcified and was easily crossed with a JR4 catheter. There was a small 8 mmHg pullback gradient.  3. LVEDP = 8 mmHg, which is low for a patient with moderate aortic stenosis.       Past Medical History  Diagnosis Date  . GERD (gastroesophageal reflux disease)   . Hypertension   . Morbid obesity   . Arthritis   . Chronic kidney disease     nephrolithiasis left kidney,s/p open resection  . Status post arthroscopic surgery of right knee   . Chronic back pain   . Pneumonia 07/2011    hx  . Pulmonary  embolism 07/2011    hx  . Uterine cancer     endometrial high grade adenocarcinoma  . Uterus cancer, sarcoma     high grade invasive carcinosarcoma  . Myocardial infarction 1989  . Bronchitis   . H/O cardiomegaly   . Lymphedema 09/29/2011  . Diabetes mellitus, diet controlled 10/03/2011  . Chronic kidney disease, stage II (mild) 09/20/2011  . Acute diastolic congestive heart failure, 2D Oct 2012 09/29/2011  . Presence of IVC filter 01/08/2012  . History of colon polyps 01/08/2012  . Chest pain 01/16/2012  . Aortic stenosis, moderate 01/16/2012  . PAF (paroxysmal atrial fibrillation), 01/18/12 01/18/2012  . CAD (coronary artery disease) 03/14/2012  . Nephrolithiasis 03/14/2012  . Diverticulosis 03/14/2012  . Allergic rhinitis, cause unspecified 05/04/2012  . Gout 06/25/2012  . Radiation 02/03/2009    4500 cGy 25 fx iridium-192/uterine  . Sustained SVT, with need for emergent DCCV 07/24/12 07/24/2012  . SOB (shortness of breath) 07/24/2012  . Hypotensive episode, secondary to SVT with HR 200 07/24/2012  . Respiratory failure, acute, secondary to tachycardia, diastolic HF 07/24/2012    Past Surgical History  Procedure Date  . Colonoscopy     multiple  . Knee arthroscopy     right knee  . Cataract extraction, bilateral   . Tubal ligation 1973  . Abdominal  hysterectomy 10/05/2008    total with b/l salpingo-oopherectomy  . Kidney stone removal   . Open left knee cartilage repair   . Left index finger surgery   . Cardiac catheterization     Family History  Problem Relation Age of Onset  . Breast cancer Sister     1st sister  . Colon cancer Sister     2nd sister  . Cervical cancer Sister     3rd sister   Social History:  reports that she has never smoked. She has never used smokeless tobacco. She reports that she does not drink alcohol or use illicit drugs. Lives with her son and daughter  Allergies: No Known Allergies  Outpatient Medications: Tylenol 500 mg every 6 hours as  needed for pain Albuterol inhaler 2 puffs every 6 hours as needed Allopurinol 100 mg one daily held for the last day Aspirin 81 mg daily Vitamin D thousand units take 2 tabs daily Colace 100 mg take total of 200 mg twice a day Multaq 400 mg twice a day Zetia 10 milligrams daily Allegra 60 mg daily Lasix 160 mg twice a Day for one week yesterday the Lasix was held Toprol-XL 25 mg daily Nitroglycerin sublingual. Ensure one twice a day if needed Prilosec 20 mg daily MiraLAX 17 g by mouth twice a day Senokot 8.6 mg tablets one daily Ambien 5-10 milligrams at bedtime as needed for sleep  Results for orders placed during the hospital encounter of 07/24/12 (from the past 48 hour(s))  POCT I-STAT 3, BLOOD GAS (G3+)     Status: Abnormal   Collection Time   07/24/12  9:18 AM      Component Value Range Comment   pH, Arterial 7.486 (*) 7.350 - 7.450    pCO2 arterial 45.4 (*) 35.0 - 45.0 mmHg    pO2, Arterial 552.0 (*) 80.0 - 100.0 mmHg    Bicarbonate 34.2 (*) 20.0 - 24.0 mEq/L    TCO2 36  0 - 100 mmol/L    O2 Saturation 100.0      Acid-Base Excess 10.0 (*) 0.0 - 2.0 mmol/L    Collection site RADIAL, ALLEN'S TEST ACCEPTABLE      Drawn by Operator      Sample type ARTERIAL     LACTIC ACID, PLASMA     Status: Abnormal   Collection Time   07/24/12  9:35 AM      Component Value Range Comment   Lactic Acid, Venous 4.8 (*) 0.5 - 2.2 mmol/L   POCT I-STAT, CHEM 8     Status: Abnormal   Collection Time   07/24/12  9:46 AM      Component Value Range Comment   Sodium 137  135 - 145 mEq/L    Potassium 3.7  3.5 - 5.1 mEq/L    Chloride 99  96 - 112 mEq/L    BUN 55 (*) 6 - 23 mg/dL    Creatinine, Ser 1.61 (*) 0.50 - 1.10 mg/dL    Glucose, Bld 096 (*) 70 - 99 mg/dL    Calcium, Ion 0.45 (*) 1.13 - 1.30 mmol/L    TCO2 27  0 - 100 mmol/L    Hemoglobin 10.5 (*) 12.0 - 15.0 g/dL    HCT 40.9 (*) 81.1 - 46.0 %    Dg Chest Portable 1 View  07/24/2012  *RADIOLOGY REPORT*  Clinical Data: Shortness of  breath.  PORTABLE CHEST - 1 VIEW  Comparison: 05/30/2012  Findings: Cardiomegaly with vascular congestion and bilateral lower  lobe opacities.  Small effusions.  Findings are similar to prior study and may reflect CHF.  IMPRESSION: Continued bilateral lower lobe opacities, effusions and cardiomegaly.  Suspect CHF.  No real change.   Original Report Authenticated By: Cyndie Chime, M.D.     ROS: General:doing well until the last 2 days then weakness, SOB Skin:No rashes or ulcers HEENT:no blurred vision or double vision RU:EAVW chest pressure today only UJW:JXBJYNWGN of breath today GI:No diarrhea constipation or melena GU:No hematuria or dysuria MS:no joint pain Neuro:No syncope Endo:No diabetes or thyroid disease   Blood pressure 111/59, pulse 90, resp. rate 24, SpO2 100.00%. PE: General:Alert and oriented, acute distress with respiratory failure and hypotension Skin:Warm and dry brisk capillary refill HEENT:Normocephalic, sclera clear, BiPAP in place Neck:Supple no JVD no carotid bruit Heart:S1-S2 rapid Lungs:Diminished breath sounds throughout FAO:ZHYQM soft nontender positive bowel sounds Ext:No pitting edema very large legs Neuro:Alert and oriented x3 follows commands moves all extremities    Assessment/Plan Principal Problem:  *Sustained SVT, with need for emergent DCCV 07/24/12 Active Problems:  CKD (chronic kidney disease) stage 4, GFR 15-29 ml/min  Aortic stenosis, moderate  Pulmonary embolism, Oct 2012  Type II or unspecified type diabetes mellitus without mention of complication, uncontrolled  Presence of IVC filter  CAD (coronary artery disease)  Anemia, unspecified  SOB (shortness of breath)  Hypotensive episode, secondary to SVT with HR 200  Respiratory failure, acute, secondary to tachycardia, diastolic HF  PLAN: Admit to CCU, wean BiPap, IV amiodarone, stop Multaq.  check troponin though would expect a bump with the tachycardia. Maxwell Martorano R 07/24/2012,  10:29 AM

## 2012-07-24 NOTE — ED Provider Notes (Signed)
Pt presents with narrow complex tachy arrythmia with rate around 200. Hypotensive with SBP in 70-80's. Adenosine 6 mg showed underlying afib. Electrical cardioversion x 2 failed to convert to SNR. Cardiology paged and Dr Riley Kill arrived. Given amiodarone 150 mg push and then re-attempted cardioversion. Converted to NSR. BP improved. Amiodarone drip started. Pt will be admitted to ICU.   CRITICAL CARE Performed by: Ranae Palms, Terre Hanneman   Total critical care time: 50  Critical care time was exclusive of separately billable procedures and treating other patients.  Critical care was necessary to treat or prevent imminent or life-threatening deterioration.  Critical care was time spent personally by me on the following activities: development of treatment plan with patient and/or surrogate as well as nursing, discussions with consultants, evaluation of patient's response to treatment, examination of patient, obtaining history from patient or surrogate, ordering and performing treatments and interventions, ordering and review of laboratory studies, ordering and review of radiographic studies, pulse oximetry and re-evaluation of patient's condition.  Loren Racer, MD 07/24/12 1245

## 2012-07-24 NOTE — ED Provider Notes (Signed)
History     CSN: [id number]  Arrival date & time 07/24/12  4098   First MD Initiated Contact with Patient 07/24/12 [phone]      Chief Complaint  Patient presents with  . Weakness  . Shortness of Breath  . Tachycardia    (Consider location/radiation/quality/duration/timing/severity/associated sxs/prior treatment) Patient is a 78 y.o. female presenting with weakness. The history is provided by the patient, a relative and the EMS personnel.  Weakness The primary symptoms include dizziness. Primary symptoms do not include headaches, syncope, loss of consciousness, altered mental status, seizures, visual change, paresthesias, focal weakness, loss of sensation, speech change, fever, nausea or vomiting. The symptoms began less than 1 hour ago. The symptoms are unchanged. The neurological symptoms are focal. Context: with elevated HR (>200)  Dizziness also occurs with weakness. Dizziness does not occur with nausea or vomiting.  Additional symptoms include weakness. Associated medical issues comments: CHF, Paroxsymal Afib. Workup history includes carotid ultrasound and cardiac workup.    Past Medical History  Diagnosis Date  . GERD (gastroesophageal reflux disease)   . Hypertension   . Morbid obesity   . Arthritis   . Chronic kidney disease     nephrolithiasis left kidney,s/p open resection  . Status post arthroscopic surgery of right knee   . Chronic back pain   . Pneumonia 07/2011    hx  . Pulmonary embolism 07/2011    hx  . Uterine cancer     endometrial high grade adenocarcinoma  . Uterus cancer, sarcoma     high grade invasive carcinosarcoma  . Myocardial infarction 1989  . Bronchitis   . H/O cardiomegaly   . Lymphedema 09/29/2011  . Diabetes mellitus, diet controlled 10/03/2011  . Chronic kidney disease, stage II (mild) 09/20/2011  . Acute diastolic congestive heart failure, 2D Oct 2012 09/29/2011  . Presence of IVC filter 01/08/2012  . History of colon polyps 01/08/2012  .  Chest pain 01/16/2012  . Aortic stenosis, moderate 01/16/2012  . PAF (paroxysmal atrial fibrillation), 01/18/12 01/18/2012  . CAD (coronary artery disease) 03/14/2012  . Nephrolithiasis 03/14/2012  . Diverticulosis 03/14/2012  . Allergic rhinitis, cause unspecified 05/04/2012  . Gout 06/25/2012  . Radiation 02/03/2009    4500 cGy 25 fx iridium-192/uterine    Past Surgical History  Procedure Date  . Colonoscopy     multiple  . Knee arthroscopy     right knee  . Cataract extraction, bilateral   . Tubal ligation 1973  . Abdominal hysterectomy 10/05/2008    total with b/l salpingo-oopherectomy  . Kidney stone removal   . Open left knee cartilage repair   . Left index finger surgery     Family History  Problem Relation Age of Onset  . Breast cancer Sister     1st sister  . Colon cancer Sister     2nd sister  . Cervical cancer Sister     3rd sister    History  Substance Use Topics  . Smoking status: Never Smoker   . Smokeless tobacco: Never Used  . Alcohol Use: No    OB History    Grav Para Term Preterm Abortions TAB SAB Ect Mult Living   9 5              Review of Systems  Constitutional: Negative for fever.  Respiratory: Positive for cough and shortness of breath.   Cardiovascular: Positive for chest pain and leg swelling. Negative for syncope.  Gastrointestinal: Negative for nausea, vomiting, abdominal pain and  diarrhea.  Neurological: Positive for dizziness and weakness. Negative for speech change, focal weakness, seizures, loss of consciousness, headaches and paresthesias.  Psychiatric/Behavioral: Negative for altered mental status.  All other systems reviewed and are negative.    Allergies  Review of patient's allergies indicates no known allergies.  Home Medications   Current Outpatient Rx  Name Route Sig Dispense Refill  . ACETAMINOPHEN 500 MG PO TABS Oral Take 500 mg by mouth every 6 (six) hours as needed. For pain    . ALBUTEROL SULFATE HFA 108 (90 BASE)  MCG/ACT IN AERS Inhalation Inhale 2 puffs into the lungs every 6 (six) hours as needed. For shortness of breath    . ALLOPURINOL 100 MG PO TABS Oral Take 100 mg by mouth daily.    . ASPIRIN EC 81 MG PO TBEC Oral Take 81 mg by mouth daily.    [name] VITAMIN D 1000 UNITS PO TABS Oral Take 2,000 Units by mouth daily.     [name] DOCUSATE SODIUM 100 MG PO CAPS Oral Take 200 mg by mouth 2 (two) times daily. For stool softner    . DRONEDARONE HCL 400 MG PO TABS Oral Take 400 mg by mouth 2 (two) times daily with a meal.    . EZETIMIBE 10 MG PO TABS Oral Take 10 mg by mouth daily.     [name] FEXOFENADINE HCL 60 MG PO TABS Oral Take 60 mg by mouth daily.    . FUROSEMIDE 80 MG PO TABS Oral Take 80 mg by mouth 2 (two) times daily.    [name] METOPROLOL SUCCINATE ER 25 MG PO TB24 Oral Take 25 mg by mouth daily.    [name] NITROGLYCERIN 0.4 MG SL SUBL Sublingual Place 0.4 mg under the tongue every 5 (five) minutes as needed. For chest pain. Do not exceed 3 tablets. If pain is not resolved after 3rd tablet call 911.    [name] ENSURE PO Oral Take 1 each by mouth 2 (two) times daily as needed. For supplement    . OMEPRAZOLE 20 MG PO CPDR Oral Take 20 mg by mouth daily.    [name] POLYETHYLENE GLYCOL 3350 PO PACK Oral Take 17 g by mouth 2 (two) times daily.     . SENNA 8.6 MG PO TABS Oral Take 1 tablet by mouth.    . ZOLPIDEM TARTRATE 5 MG PO TABS Oral Take 5-10 mg by mouth at bedtime as needed. For sleep.      BP 98/65  Pulse 101  Resp 24  SpO2 100%  Physical Exam  Nursing note and vitals reviewed. Constitutional: She is oriented to person, place, and time. She appears well-developed and well-nourished. No distress.  HENT:  Head: Normocephalic and atraumatic.  Eyes: EOM are normal. Pupils are equal, round, and reactive to light.  Neck: Normal range of motion.  Cardiovascular: Normal heart sounds.  Tachycardia present.        Radial pulse intermittent, but able to be palpated when beats conducted  Pulmonary/Chest: Effort normal and breath  sounds normal. No respiratory distress.  Abdominal: Soft. She exhibits no distension. There is no tenderness.  Musculoskeletal: Normal range of motion. She exhibits edema.       Severe edema to lower legs  Neurological: She is alert and oriented to person, place, and time. GCS eye subscore is 4. GCS verbal subscore is 5. GCS motor subscore is 6.  Skin: Skin is warm and dry.    ED Course  Procedures (including critical care time)  Labs Reviewed  POCT I-STAT 3, BLOOD GAS (G3+) - Abnormal; Notable for the following:    pH, Arterial 7.486 (*)     pCO2 arterial 45.4 (*)     pO2, Arterial 552.0 (*)     Bicarbonate 34.2 (*)     Acid-Base Excess 10.0 (*)     All other components within normal limits  POCT I-STAT, CHEM 8 - Abnormal; Notable for the following:    BUN 55 (*)     Creatinine, Ser 3.10 (*)     Glucose, Bld 105 (*)     Calcium, Ion 1.09 (*)     Hemoglobin 10.5 (*)     HCT 31.0 (*)     All other components within normal limits  CBC WITH DIFFERENTIAL  COMPREHENSIVE METABOLIC PANEL  URINALYSIS, ROUTINE W REFLEX MICROSCOPIC  PROTIME-INR  APTT  TROPONIN I  PRO B NATRIURETIC PEPTIDE  LACTIC ACID, PLASMA   Dg Chest Portable 1 View  07/24/2012  *RADIOLOGY REPORT*  Clinical Data: Shortness of breath.  PORTABLE CHEST - 1 VIEW  Comparison: 05/30/2012  Findings: Cardiomegaly with vascular congestion and bilateral lower lobe opacities.  Small effusions.  Findings are similar to prior study and may reflect CHF.  IMPRESSION: Continued bilateral lower lobe opacities, effusions and cardiomegaly.  Suspect CHF.  No real change.   Original Report Authenticated By: Cyndie Chime, M.D.    EKG: rate 200, regular, no other obvious abnormalities, possibly SVT  1. Sustained SVT, with need for emergent DCCV 07/24/12   2. Hypotensive episode, secondary to SVT with HR 200   3. SOB (shortness of breath)   4. Respiratory failure, acute, secondary to tachycardia, diastolic HF       MDM  Pt  presented with extreme tachycardia. She was mentating with intermittent distal radius pulse (as though not every beat was conducting into contraction). Pt initially thought to be in rapid SVT as beats were regular on initial EKG. Adenosine 6mg  was given. When heart rate slowed down, patient was noted to have irregular rate, presumed to be AFib. Because of this and continued low BP reading (though with continued normal mental status and palpable radial pulse), patient was then sync cardioverted twice (100J then 150J). Each time patient again became tachycardic. Cardiology was then called. They decided to give amio 150mg  bolus. Pt with intermittent response to this (several beat breaks at a time). Pt was then sync cardioverted at 200J. PT also noted to have pulmonary edema, so fluids were titrated down. Unknown cause of onset for this episode but patient did have recent increase in diuretic. Will send labs.   9:56 AM Pt with improved blood pressure (SBP 98) and HR between 90-100. Will start patient on Amio drip. Cardiology to admit to CCU.        Daleen Bo, MD 07/24/12 [phone]

## 2012-07-24 NOTE — ED Notes (Signed)
Pt here by ems, initial call for body aches, and decompensated quickly, pt onarrival hr 200 irregular, sob, on cpap, pt denies pain.

## 2012-07-24 NOTE — Progress Notes (Signed)
Nada Boozer NP notified of pt's positive MRSA PCR result. Placed on contact precautions and order set initiated per protocol. Family and pt educated. All questions answered.

## 2012-07-24 NOTE — ED Notes (Signed)
Pt given adenosine 6 mg IVP with 20 ml saline push

## 2012-07-24 NOTE — Progress Notes (Signed)
ANTICOAGULATION CONSULT NOTE - Initial Consult  Pharmacy Consult for heparin Indication: atrial fibrillation  No Known Allergies  Patient Measurements:   Heparin Dosing Weight: 86.1kg  Vital Signs: BP: 111/59 mmHg (10/10 1017) Pulse Rate: 90  (10/10 0957)  Labs:  Basename 07/24/12 0946 07/24/12 0934  HGB 10.5* 9.7*  HCT 31.0* 31.3*  PLT -- 587*  APTT -- 34  LABPROT -- 14.9  INR -- 1.19  HEPARINUNFRC -- --  CREATININE 3.10* 3.22*  CKTOTAL -- --  CKMB -- --  TROPONINI -- <0.30    The CrCl is unknown because both a height and weight (above a minimum accepted value) are required for this calculation.   Medical History: Past Medical History  Diagnosis Date  . GERD (gastroesophageal reflux disease)   . Hypertension   . Morbid obesity   . Arthritis   . Chronic kidney disease     nephrolithiasis left kidney,s/p open resection  . Status post arthroscopic surgery of right knee   . Chronic back pain   . Pneumonia 07/2011    hx  . Pulmonary embolism 07/2011    hx  . Uterine cancer     endometrial high grade adenocarcinoma  . Uterus cancer, sarcoma     high grade invasive carcinosarcoma  . Myocardial infarction 1989  . Bronchitis   . H/O cardiomegaly   . Lymphedema 09/29/2011  . Diabetes mellitus, diet controlled 10/03/2011  . Chronic kidney disease, stage II (mild) 09/20/2011  . Acute diastolic congestive heart failure, 2D Oct 2012 09/29/2011  . Presence of IVC filter 01/08/2012  . History of colon polyps 01/08/2012  . Chest pain 01/16/2012  . Aortic stenosis, moderate 01/16/2012  . PAF (paroxysmal atrial fibrillation), 01/18/12 01/18/2012  . CAD (coronary artery disease) 03/14/2012  . Nephrolithiasis 03/14/2012  . Diverticulosis 03/14/2012  . Allergic rhinitis, cause unspecified 05/04/2012  . Gout 06/25/2012  . Radiation 02/03/2009    4500 cGy 25 fx iridium-192/uterine  . Sustained SVT, with need for emergent DCCV 07/24/12 07/24/2012  . SOB (shortness of breath)  07/24/2012  . Hypotensive episode, secondary to SVT with HR 200 07/24/2012  . Respiratory failure, acute, secondary to tachycardia, diastolic HF 07/24/2012    Medications:  Prescriptions prior to admission  Medication Sig Dispense Refill  . acetaminophen (TYLENOL) 500 MG tablet Take 500 mg by mouth every 6 (six) hours as needed. For pain      . albuterol (PROVENTIL HFA;VENTOLIN HFA) 108 (90 BASE) MCG/ACT inhaler Inhale 2 puffs into the lungs every 6 (six) hours as needed. For shortness of breath      . allopurinol (ZYLOPRIM) 100 MG tablet Take 100 mg by mouth daily.      [name] aspirin EC 81 MG tablet Take 81 mg by mouth daily.      . cholecalciferol (VITAMIN D) 1000 UNITS tablet Take 2,000 Units by mouth daily.       [name] docusate sodium (COLACE) 100 MG capsule Take 200 mg by mouth 2 (two) times daily. For stool softner      . dronedarone (MULTAQ) 400 MG tablet Take 400 mg by mouth 2 (two) times daily with a meal.      . ezetimibe (ZETIA) 10 MG tablet Take 10 mg by mouth daily.       . fexofenadine (ALLEGRA) 60 MG tablet Take 60 mg by mouth daily.      . furosemide (LASIX) 80 MG tablet Take 80 mg by mouth 2 (two) times daily.      [name]  metoprolol succinate (TOPROL-XL) 25 MG 24 hr tablet Take 25 mg by mouth daily.      . nitroGLYCERIN (NITROSTAT) 0.4 MG SL tablet Place 0.4 mg under the tongue every 5 (five) minutes as needed. For chest pain. Do not exceed 3 tablets. If pain is not resolved after 3rd tablet call 911.      . Nutritional Supplements (ENSURE PO) Take 1 each by mouth 2 (two) times daily as needed. For supplement      . omeprazole (PRILOSEC) 20 MG capsule Take 20 mg by mouth daily.      . polyethylene glycol (MIRALAX / GLYCOLAX) packet Take 17 g by mouth 2 (two) times daily.       [name] senna (SENOKOT) 8.6 MG TABS Take 1 tablet by mouth.      . zolpidem (AMBIEN) 5 MG tablet Take 5-10 mg by mouth at bedtime as needed. For sleep.        Assessment: 72 yof presented to the hospital with SOB and  chest discomfort to start on IV heparin for afib. CE's are negative so far. Noted pt history of PE and afib. She had been on coumadin in the past but this was stopped due to vaginal bleeding. She was not on any anticoagulation PTA. Baseline INR is WNL. Pt is anemic with thrombocytosis.   Goal of Therapy:  Heparin level 0.3-0.7 units/ml Monitor platelets by anticoagulation protocol: Yes   Plan:  1. Heparin bolus 4000 units IV x 1 2. Heparin gtt 1350 units/hr 3. Check an 8 hour heparin level 4. Daily heparin level and CBC  Shadell Brenn, Drake Leach 07/24/2012,1:25 PM

## 2012-07-25 ENCOUNTER — Inpatient Hospital Stay (HOSPITAL_COMMUNITY): Payer: Medicare Other

## 2012-07-25 LAB — CBC
MCH: 29.8 pg (ref 26.0–34.0)
MCHC: 32.4 g/dL (ref 30.0–36.0)
MCV: 91.9 fL (ref 78.0–100.0)
Platelets: 536 10*3/uL — ABNORMAL HIGH (ref 150–400)
RBC: 2.85 MIL/uL — ABNORMAL LOW (ref 3.87–5.11)
RDW: 16 % — ABNORMAL HIGH (ref 11.5–15.5)

## 2012-07-25 LAB — LIPID PANEL
Cholesterol: 139 mg/dL (ref 0–200)
HDL: 22 mg/dL — ABNORMAL LOW (ref >39)
LDL Cholesterol: 97 mg/dL (ref 0–99)
Total CHOL/HDL Ratio: 6.3 RATIO
Triglycerides: 102 mg/dL (ref <150)
VLDL: 20 mg/dL (ref 0–40)

## 2012-07-25 LAB — BASIC METABOLIC PANEL
CO2: 30 mEq/L (ref 19–32)
Calcium: 8.9 mg/dL (ref 8.4–10.5)
Chloride: 94 mEq/L — ABNORMAL LOW (ref 96–112)
Glucose, Bld: 92 mg/dL (ref 70–99)
Sodium: 135 mEq/L (ref 135–145)

## 2012-07-25 LAB — HEPARIN LEVEL (UNFRACTIONATED)
Heparin Unfractionated: 0.17 IU/mL — ABNORMAL LOW (ref 0.30–0.70)
Heparin Unfractionated: <0.1 IU/mL — ABNORMAL LOW (ref 0.30–0.70)

## 2012-07-25 MED ORDER — WARFARIN SODIUM 5 MG PO TABS
5.0000 mg | ORAL_TABLET | Freq: Once | ORAL | Status: AC
  Administered 2012-07-25: 5 mg via ORAL
  Filled 2012-07-25 (×2): qty 1

## 2012-07-25 MED ORDER — HEPARIN (PORCINE) IN NACL 100-0.45 UNIT/ML-% IJ SOLN
2200.0000 [IU]/h | INTRAMUSCULAR | Status: DC
  Administered 2012-07-26 – 2012-07-27 (×2): 2050 [IU]/h via INTRAVENOUS
  Administered 2012-07-28: 2200 [IU]/h via INTRAVENOUS
  Filled 2012-07-25 (×11): qty 250

## 2012-07-25 MED ORDER — AMIODARONE HCL 200 MG PO TABS
400.0000 mg | ORAL_TABLET | Freq: Two times a day (BID) | ORAL | Status: AC
  Administered 2012-07-25 – 2012-07-29 (×10): 400 mg via ORAL
  Filled 2012-07-25 (×10): qty 2

## 2012-07-25 MED ORDER — WARFARIN - PHARMACIST DOSING INPATIENT
Freq: Every day | Status: DC
  Administered 2012-07-25 – 2012-07-28 (×2)

## 2012-07-25 MED ORDER — HEPARIN BOLUS VIA INFUSION
3000.0000 [IU] | Freq: Once | INTRAVENOUS | Status: DC
  Filled 2012-07-25: qty 3000

## 2012-07-25 MED ORDER — WARFARIN VIDEO
Freq: Once | Status: AC
  Administered 2012-07-25: 13:00:00

## 2012-07-25 MED ORDER — COUMADIN BOOK
Freq: Once | Status: AC
  Administered 2012-07-25: 13:00:00
  Filled 2012-07-25 (×2): qty 1

## 2012-07-25 MED ORDER — FUROSEMIDE 80 MG PO TABS
80.0000 mg | ORAL_TABLET | Freq: Two times a day (BID) | ORAL | Status: DC
  Administered 2012-07-25 – 2012-08-09 (×22): 80 mg via ORAL
  Filled 2012-07-25 (×33): qty 1

## 2012-07-25 MED FILL — Medication: Qty: 1 | Status: AC

## 2012-07-25 NOTE — Progress Notes (Signed)
ANTICOAGULATION CONSULT NOTE - Follow Up Consult  Pharmacy Consult for Heparin/Warfarin Indication: atrial fibrillation  Labs:  Basename 07/25/12 0900 07/25/12 0450 07/24/12 2340 07/24/12 1810 07/24/12 1345 07/24/12 0946 07/24/12 0934  HGB 8.5* -- -- -- -- 10.5* --  HCT 26.2* -- -- -- -- 31.0* 31.3*  PLT 536* -- -- -- -- -- 587*  APTT -- -- -- -- -- -- 34  LABPROT -- -- -- -- 15.2 -- 14.9  INR -- -- -- -- 1.22 -- 1.19  HEPARINUNFRC 0.17* -- <0.10* -- -- -- --  CREATININE -- 3.29* -- -- -- 3.10* 3.22*  CKTOTAL -- -- -- -- 142 -- --  CKMB -- -- -- -- 2.6 -- --  TROPONINI -- -- -- <0.30 <0.30 -- <0.30   Assessment: 78yo female with history of A-fib/SVT, admitted after a need for urgent cardioversion.  She was started on IV Heparin and Amiodarone.  Her initial heparin level was undetectable and her dose was increased to 1700 units/hr.  RN noted that pt coughed some blood earlier, but no signs currently of active bleeding per RN.  She is still less than desired goal range of 0.3-0.7 after increasing rate (0.17).  Plans now are to add oral anticoagulation with Warfarin starting tonight.  She has a history of GI and vaginal bleeding with past Warfarin use.  We will up-titrate her regimen slowly and aim for lower end of goal range (2.0-2.5).  Warf Education: initiated (Warfarin book, and video ordered) for patient review.  Drug/Drug Interactions:   Effects: Amiodarone may increase the hypoprothrombinemic effect of warfarin. The severity of this interaction may depend on the dosage of amiodarone. Bleeding may occur.  Mechanism: Cytochrome P450 2C9-mediated hepatic metabolism of warfarin may be inhibited by amiodarone or its active metabolite, desethylamiodarone. A minor metabolite, N,N-didesethylamiodarone (DDEA) has been studied and found to be a major contributor to the interaction between amiodarone and warfarin.  Goal of Therapy:  Heparin level 0.3-0.7 units/ml INR 2.0-2.5 Monitor  platelets by anticoagulation protocol: Yes   Plan:   Will increase heparin gtt to 1850 units/hr without bolus.  Check 8 hour level to make sure she is within target range.  Warfarin 5mg  x 1  Daily protimes  Monitor Warfarin effects with Amiodarone on board  Nadara Mustard, PharmD., MS Clinical Pharmacist Pager:  [phone] Thank you for allowing pharmacy to be part of this patients care team. 07/25/2012,10:35 AM

## 2012-07-25 NOTE — Progress Notes (Signed)
[facility] DAILY PROGRESS NOTE  NAME:  Christine [name]   MRN: [id number] DOB:  [birth year]   ADMIT DATE: 07/24/2012   Patient Description   78 y.o. female with PMH below:  presented with SOB in the setting of what looked like SVT (rate of 200) -- unresponsive to Adenosine, converted to Afib.  Finally cardioverted following IV Amiodarone load on 3rd attempt. On BiPap overnight.  Lasix had recently been increased for LE edema -- held on admission for hypotension & weakness.   Past Medical History  Diagnosis Date  . GERD (gastroesophageal reflux disease)   . Hypertension   . Morbid obesity   . Chronic kidney disease     nephrolithiasis left kidney,s/p open resection  . Chronic back pain   . Pneumonia 07/2011    hx  . Pulmonary embolism 07/2011    Hx -- Not Felt to be anticoagualtion candidate due to GI/Vaginal Bleeding on Warfarin.  [name] Uterine cancer     endometrial high grade adenocarcinoma  . Uterus cancer, sarcoma     high grade invasive carcinosarcoma  . Myocardial infarction 1989  . Bronchitis   . H/O cardiomegaly   . Lymphedema 09/29/2011  . Diabetes mellitus, diet controlled 10/03/2011  . Chronic kidney disease, stage II (mild) 09/20/2011  . Acute diastolic congestive heart failure, 2D Oct 2012 09/29/2011  . Presence of IVC filter 01/08/2012  . Aortic stenosis, moderate 01/16/2012  . PAF (paroxysmal atrial fibrillation), 01/18/12 01/18/2012  . CAD (coronary artery disease) 03/14/2012  . Diverticulosis 03/14/2012  . Gout 06/25/2012  . Radiation 02/03/2009    4500 cGy 25 fx iridium-192/uterine   Length of Stay:  LOS: 1 day   Subjective:   Today [name] looks & feels much better. Sitting up & eating.    Objective:  Temp:  [98 F (36.7 C)-98.9 F (37.2 C)] 98.6 F (37 C) (10/11 0800) Pulse Rate:  [63-101] 68  (10/11 0800) Resp:  [13-26] 17  (10/11 0900) BP: (81-122)/(37-78) 105/56 mmHg (10/11 0800) SpO2:  [93 %-100 %] 99 % (10/11  0800) Arterial Line BP: (80-135)/(37-56) 135/55 mmHg (10/11 0900) Weight:  [107.6 kg (237 lb 3.4 oz)-110.6 kg (243 lb 13.3 oz)] 110.6 kg (243 lb 13.3 oz) (10/11 0500) Weight change:  Physical Exam: General appearance: alert, cooperative, appears stated age, no distress, moderately obese and pleasant mood & affect Neck: JVD - 5 cm above sternal notch, no adenopathy, supple, symmetrical, trachea midline and thyroid not enlarged, symmetric, no tenderness/mass/nodules Lungs: diminished breath sounds bibasilar, dullness to percussion bibasilar and rales bibasilar Heart: regular rate and rhythm, S1, S2 normal and systolic murmur: mid peaking 2/6, crescendo, decrescendo and harsh at 2nd right intercostal space, radiates to carotids Abdomen: soft, non-tender; bowel sounds normal; no masses,  no organomegaly and obese Extremities: edema ~2+ pitting edema (with some lymphedema) & fatty legs.  chronic stais changes noted.  Intake/Output from previous day: 10/10 0701 - 10/11 0700 In: 2172.2 [P.O.:125; I.V.:1747.2; IV Piggyback:250] Out: 472 [Urine:472]  Intake/Output Summary (Last 24 hours) at 07/25/12 0944 Last data filed at 07/25/12 0900  Gross per 24 hour  Intake 2748.68 ml  Output    572 ml  Net 2176.68 ml    Lab 07/25/12 0450 07/24/12 0946 07/24/12 0934  NA 135 137 137  K 3.5 3.7 3.7  CL 94* 99 93*  CO2 30 -- 26  BUN 57* 55* 53*  CREATININE 3.29* 3.10* 3.22*   Noted increase of Creatinine  Lab 07/25/12 0900 07/24/12 0946 07/24/12 0934  WBC 17.8* -- 17.5*  HGB 8.5* 10.5* 9.7*  HCT 26.2* 31.0* 31.3*  PLT 536* -- 587*   No components found with this basename: TROP:3, BNP:3  Imaging:  CXR reviewed -- agree with radiology assessment of increased appearance of pulmonary edema & bilateral effusions.  MAR Reviewed  Assessment/Plan:  Principal Problem:  *Sustained SVT, with need for emergent DCCV 07/24/12 Active Problems:  Acute on chronic diastolic congestive heart failure --  secondary to rapid SVT, aFib RVR  Aortic stenosis, moderate; Mean gradient of by cath  Chronic diastolic heart failure  Pulmonary embolism, Oct 2012  CKD (chronic kidney disease) stage 4, GFR 15-29 ml/min  Type II or unspecified type diabetes mellitus without mention of complication, "Diet" controlled  Presence of IVC filter  Anemia, unspecified  SOB (shortness of breath)  Hypotensive episode, secondary to SVT with HR 200  Respiratory failure, acute, secondary to tachycardia, diastolic HF  Successfully cardioverted after IV Amiodarone load.  Remains hemodynamically stable now.  No further arrythmia.  He initial rhythm did appear to be SVT - reportedly converted to Afib during initial management.  Had severe Vaginal & GI bleed on warfarin in the past & deemed a poor anticoagulation (A/C) candidate (in the setting of A/C for PE)  Will need to consider options for long-term anticoagulation for CVA prevention, but may be best to do so for 1st month post DCCV. IV Heparin was initiated to monitor for ? ACS -- cardiac markers are negative.  Have reviewed risks/benefits of withholding anticoagulation.  Will plan to initiate Warfarin PO tonite to Rx while on Heparin.  reluctant to use newer A/C agents given CKD-4.  Convert to PO amiodarone today & continue with PO load (400mg  BID)  On low dose BB as well.  BNP elevated -- likely related to her tachycardia yesterday, was actually thought to be "dehydrated" upon admission.    Weaned off BiPAP also Creatinine also worse today -- acute on chronic renal insufficieny due to ~ATN from hypotension in setting of tachycardia.  Hold lasix for this AM due to increased Creatine, but will need to re-start this PM with PO 80mg  based on CXR finding of worsening pulmonary edema & effusion -- need to avoid further CHF.  KVO IVF  Follow UOP & renal Fxn.  Will notify Nephrology.  Continue to hold Allopurinol until renal function returns to  baseline.  Blood sugars are well controlled.  Currently using Ensure supplement, if CBG increases, may need to change to Glucerna.  Transfer to Tele today -- consider d/c with continued PO Amiodarone load.  Time Spent Directly with Patient:  15 minutes   HARDING,DAVID W, M.D., M.S. [facility] [address] [place], [place]  [address]  [phone]  07/25/2012 9:44 AM

## 2012-07-25 NOTE — Progress Notes (Signed)
ANTICOAGULATION CONSULT NOTE - Follow Up Consult  Pharmacy Consult for Heparin Indication: atrial fibrillation  Labs:  Basename 07/25/12 1909 07/25/12 0900 07/25/12 0450 07/24/12 2340 07/24/12 1810 07/24/12 1345 07/24/12 0946 07/24/12 0934  HGB -- 8.5* -- -- -- -- 10.5* --  HCT -- 26.2* -- -- -- -- 31.0* 31.3*  PLT -- 536* -- -- -- -- -- 587*  APTT -- -- -- -- -- -- -- 34  LABPROT -- -- -- -- -- 15.2 -- 14.9  INR -- -- -- -- -- 1.22 -- 1.19  HEPARINUNFRC 0.23* 0.17* -- <0.10* -- -- -- --  CREATININE -- -- 3.29* -- -- -- 3.10* 3.22*  CKTOTAL -- -- -- -- -- 142 -- --  CKMB -- -- -- -- -- 2.6 -- --  TROPONINI -- -- -- -- <0.30 <0.30 -- <0.30   Assessment: 78yo female with history of A-fib/SVT, admitted after a need for urgent cardioversion.  She was started on IV Heparin and Amiodarone.  Heparin is still subtherapeutic this pm but trending up.   Goal of Therapy:  Heparin level 0.3-0.7 units/ml INR 2.0-2.5 Monitor platelets by anticoagulation protocol: Yes   Plan:   Increase heparin to 2050 units/hr F/u with AM heparin level

## 2012-07-25 NOTE — Progress Notes (Signed)
ANTICOAGULATION CONSULT NOTE - Follow Up Consult  Pharmacy Consult for heparin Indication: atrial fibrillation  Labs:  Basename 07/24/12 2340 07/24/12 1810 07/24/12 1345 07/24/12 0946 07/24/12 0934  HGB -- -- -- 10.5* 9.7*  HCT -- -- -- 31.0* 31.3*  PLT -- -- -- -- 587*  APTT -- -- -- -- 34  LABPROT -- -- 15.2 -- 14.9  INR -- -- 1.22 -- 1.19  HEPARINUNFRC <0.10* -- -- -- --  CREATININE -- -- -- 3.10* 3.22*  CKTOTAL -- -- 142 -- --  CKMB -- -- 2.6 -- --  TROPONINI -- <0.30 <0.30 -- <0.30    Assessment: 78yo female undetectable on heparin with initial dosing for Afib; RN noted that pt coughed some blood earlier, no signs currently of active bleeding.  Goal of Therapy:  Heparin level 0.3-0.7 units/ml   Plan:  Will increase heparin gtt by 4 units/kg/hr to 1700 units/hr without bolus given bleeding earlier and check level in 8hr.  Colleen Can PharmD BCPS 07/25/2012,12:37 AM

## 2012-07-25 NOTE — Progress Notes (Signed)
Dr. Herbie Baltimore said to keep foley catheter for now for urine output monitoring and strict I & O due to diuresis and chronic kidney disease.  Nephrology to see her later today.

## 2012-07-26 LAB — TYPE AND SCREEN

## 2012-07-26 LAB — HEMOGLOBIN AND HEMATOCRIT, BLOOD
HCT: 27 % — ABNORMAL LOW (ref 36.0–46.0)
Hemoglobin: 8.7 g/dL — ABNORMAL LOW (ref 12.0–15.0)

## 2012-07-26 LAB — CBC
HCT: 26.2 % — ABNORMAL LOW (ref 36.0–46.0)
MCH: 29 pg (ref 26.0–34.0)
MCHC: 31.7 g/dL (ref 30.0–36.0)
MCV: 91.6 fL (ref 78.0–100.0)
Platelets: 561 10*3/uL — ABNORMAL HIGH (ref 150–400)
RDW: 15.8 % — ABNORMAL HIGH (ref 11.5–15.5)

## 2012-07-26 LAB — BASIC METABOLIC PANEL
BUN: 53 mg/dL — ABNORMAL HIGH (ref 6–23)
Calcium: 8.7 mg/dL (ref 8.4–10.5)
Chloride: 95 mEq/L — ABNORMAL LOW (ref 96–112)
Creatinine, Ser: 2.73 mg/dL — ABNORMAL HIGH (ref 0.50–1.10)
GFR calc Af Amer: 18 mL/min — ABNORMAL LOW (ref >90)

## 2012-07-26 LAB — PROTIME-INR: Prothrombin Time: 14.3 seconds (ref 11.6–15.2)

## 2012-07-26 MED ORDER — WARFARIN SODIUM 5 MG PO TABS
5.0000 mg | ORAL_TABLET | Freq: Once | ORAL | Status: AC
  Administered 2012-07-26: 5 mg via ORAL
  Filled 2012-07-26 (×2): qty 1

## 2012-07-26 MED ORDER — DILTIAZEM LOAD VIA INFUSION
10.0000 mg | Freq: Once | INTRAVENOUS | Status: AC
  Administered 2012-07-26: 10 mg via INTRAVENOUS
  Filled 2012-07-26: qty 10

## 2012-07-26 MED ORDER — DILTIAZEM HCL 100 MG IV SOLR
5.0000 mg/h | INTRAVENOUS | Status: DC
  Administered 2012-07-26 – 2012-07-27 (×2): 5 mg/h via INTRAVENOUS
  Filled 2012-07-26 (×2): qty 100

## 2012-07-26 MED ORDER — DILTIAZEM LOAD VIA INFUSION
10.0000 mg | Freq: Once | INTRAVENOUS | Status: AC
  Filled 2012-07-26 (×2): qty 10

## 2012-07-26 NOTE — Progress Notes (Signed)
ANTICOAGULATION CONSULT NOTE - Follow Up Consult  Pharmacy Consult for heparin Indication: atrial fibrillation  Labs:  Basename 07/26/12 0549 07/25/12 1909 07/25/12 0900 07/25/12 0450 07/24/12 1810 07/24/12 1345 07/24/12 0946 07/24/12 0934  HGB 8.3* -- 8.5* -- -- -- -- --  HCT 26.2* -- 26.2* -- -- -- 31.0* --  PLT 561* -- 536* -- -- -- -- 587*  APTT -- -- -- -- -- -- -- 34  LABPROT 14.3 -- -- -- -- 15.2 -- 14.9  INR 1.13 -- -- -- -- 1.22 -- 1.19  HEPARINUNFRC 0.31 0.23* 0.17* -- -- -- -- --  CREATININE -- -- -- 3.29* -- -- 3.10* 3.22*  CKTOTAL -- -- -- -- -- 142 -- --  CKMB -- -- -- -- -- 2.6 -- --  TROPONINI -- -- -- -- <0.30 <0.30 -- <0.30    Assessment/Plan: 78yo female now therapeutic on heparin after multiple rate increases; at low end of goal but may accumulate given age/CrCl so will continue gtt at current rate and confirm stable with additional level.  Colleen Can PharmD BCPS 07/26/2012,6:45 AM

## 2012-07-26 NOTE — Progress Notes (Signed)
ANTICOAGULATION CONSULT NOTE - Follow Up Consult  Pharmacy Consult for Warfarin Indication: atrial fibrillation  Labs:  Basename 07/26/12 0954 07/26/12 0549 07/25/12 1909 07/25/12 0900 07/25/12 0450 07/24/12 1810 07/24/12 1345 07/24/12 0946 07/24/12 0934  HGB 8.7* 8.3* -- -- -- -- -- -- --  HCT 27.0* 26.2* -- 26.2* -- -- -- -- --  PLT -- 561* -- 536* -- -- -- -- 587*  APTT -- -- -- -- -- -- -- -- 34  LABPROT -- 14.3 -- -- -- -- 15.2 -- 14.9  INR -- 1.13 -- -- -- -- 1.22 -- 1.19  HEPARINUNFRC -- 0.31 0.23* 0.17* -- -- -- -- --  CREATININE -- 2.73* -- -- 3.29* -- -- 3.10* --  CKTOTAL -- -- -- -- -- -- 142 -- --  CKMB -- -- -- -- -- -- 2.6 -- --  TROPONINI -- -- -- -- -- <0.30 <0.30 -- <0.30   Assessment: 78yo female with history of A-fib/SVT, on coumadin and heparin. The second heparin level today was 0.38 (called lab as the 'reporting system' is down)  Goal of Therapy:  Heparin level= 0.3- 0.7 INR 2.0-2.5 Monitor platelets by anticoagulation protocol: Yes   Plan:  -No heparin changes needed -Continue heparin level daily  Harland German, Pharm D 07/26/2012 4:59 PM

## 2012-07-26 NOTE — Progress Notes (Signed)
[facility]  DAILY PROGRESS NOTE   Subjective:  Has mild left sided pleuritic chest pain. Breathing is at baseline.No chills. No overt bleeding. After slight drop in hgb with hydration, hgb now stable. No further arrhythmia.  Objective:  Temp:  [98.3 F (36.8 C)-98.7 F (37.1 C)] 98.3 F (36.8 C) (10/12 0500) Pulse Rate:  [78-90] 82  (10/12 0500) Resp:  [15-20] 18  (10/12 0500) BP: (117-123)/(44-71) 120/44 mmHg (10/12 0500) SpO2:  [99 %-100 %] 99 % (10/12 0500) Arterial Line BP: (118-130)/(50-54) 118/50 mmHg (10/11 1040) Weight:  [112.628 kg (248 lb 4.8 oz)] 112.628 kg (248 lb 4.8 oz) (10/12 0500) Weight change: 5.028 kg (11 lb 1.4 oz)  Intake/Output from previous day: 10/11 0701 - 10/12 0700 In: 1410.2 [P.O.:1140; I.V.:270.2] Out: 1215 [Urine:1215]  Intake/Output from this shift:    Medications: Current Facility-Administered Medications  Medication Dose Route Frequency Provider Last Rate Last Dose  . 0.9 %  sodium chloride infusion   Intravenous Continuous Nada Boozer, NP 10 mL/hr at 07/25/12 0340    . 0.9 %  sodium chloride infusion   Intravenous Continuous Marykay Lex, MD 10 mL/hr at 07/25/12 [phone]    . acetaminophen (TYLENOL) tablet 650 mg  650 mg Oral Q4H PRN Nada Boozer, NP   650 mg at 07/24/12 2105  . albuterol (PROVENTIL HFA;VENTOLIN HFA) 108 (90 BASE) MCG/ACT inhaler 2 puff  2 puff Inhalation Q6H PRN Nada Boozer, NP      . alum & mag hydroxide-simeth (MAALOX/MYLANTA) 200-200-20 MG/5ML suspension 30 mL  30 mL Oral Q4H PRN Nada Boozer, NP   30 mL at 07/24/12 2139  . amiodarone (PACERONE) tablet 400 mg  400 mg Oral BID Marykay Lex, MD   400 mg at 07/25/12 2215  . aspirin EC tablet 81 mg  81 mg Oral Daily Nada Boozer, NP   81 mg at 07/25/12 1044  . Chlorhexidine Gluconate Cloth 2 % PADS 6 each  6 each Topical Q0600 Nada Boozer, NP   6 each at 07/26/12 [phone]  . cholecalciferol (VITAMIN D) tablet 2,000 Units  2,000 Units Oral Daily  Nada Boozer, NP   2,000 Units at 07/25/12 1044  . coumadin book   Does not apply Once Chinita Greenland, PHARMD      . docusate sodium (COLACE) capsule 200 mg  200 mg Oral BID Nada Boozer, NP   200 mg at 07/25/12 1044  . ezetimibe (ZETIA) tablet 10 mg  10 mg Oral Daily Nada Boozer, NP   10 mg at 07/25/12 1044  . feeding supplement (ENSURE COMPLETE) liquid 237 mL  237 mL Oral BID BM Chrystie Nose, MD   237 mL at 07/25/12 1348  . furosemide (LASIX) tablet 80 mg  80 mg Oral BID Marykay Lex, MD   80 mg at 07/26/12 0857  . heparin ADULT infusion 100 units/mL (25000 units/250 mL)  2,050 Units/hr Intravenous Continuous Chrystie Nose, MD 20.5 mL/hr at 07/25/12 2020 2,050 Units/hr at 07/25/12 2020  . HYDROcodone-acetaminophen (NORCO/VICODIN) 5-325 MG per tablet 1 tablet  1 tablet Oral Q8H PRN Nada Boozer, NP   1 tablet at 07/25/12 1043  . loratadine (CLARITIN) tablet 10 mg  10 mg Oral Daily Nada Boozer, NP   10 mg at 07/25/12 1044  . metoprolol succinate (TOPROL-XL) 24 hr tablet 25 mg  25 mg Oral Daily Nada Boozer, NP   25 mg at 07/25/12 1044  . mupirocin ointment (BACTROBAN) 2 % 1 application  1 application Nasal BID Nada Boozer, NP   1 application at 07/25/12 2215  . nitroGLYCERIN (NITROSTAT) SL tablet 0.4 mg  0.4 mg Sublingual Q5 Min x 3 PRN Nada Boozer, NP      . ondansetron [facility]) injection 4 mg  4 mg Intravenous Q6H PRN Nada Boozer, NP      . pantoprazole (PROTONIX) EC tablet 40 mg  40 mg Oral Q1200 Nada Boozer, NP   40 mg at 07/25/12 1044  . polyethylene glycol (MIRALAX / GLYCOLAX) packet 17 g  17 g Oral BID Nada Boozer, NP   17 g at 07/25/12 1045  . senna (SENOKOT) tablet 8.6 mg  1 tablet Oral Daily Nada Boozer, NP   8.6 mg at 07/25/12 1044  . warfarin (COUMADIN) tablet 5 mg  5 mg Oral ONCE-1800 Chinita Greenland, PHARMD   5 mg at 07/25/12 1800  . warfarin (COUMADIN) video   Does not apply Once Chinita Greenland, [facility]      . Warfarin - Pharmacist Dosing Inpatient   Does  not apply q1800 Chinita Greenland, PHARMD      . zolpidem (AMBIEN) tablet 5 mg  5 mg Oral QHS PRN Nada Boozer, NP      . DISCONTD: heparin ADULT infusion 100 units/mL (25000 units/250 mL)  1,850 Units/hr Intravenous Continuous Chinita Greenland, PHARMD 18.5 mL/hr at 07/25/12 1833 1,850 Units/hr at 07/25/12 1833    Physical Exam: General appearance: alert, cooperative, no distress and morbidly obese Neck: no adenopathy, no carotid bruit, supple, symmetrical, trachea midline, thyroid not enlarged, symmetric, no tenderness/mass/nodules and cannot see JVP Lungs: clear to auscultation bilaterally Heart: regular rate and rhythm, S1, S2 normal and systolic murmur: systolic ejection 3/6,   at 2nd left intercostal space, at 2nd right intercostal space Abdomen: normal findings: bowel sounds normal and soft, non-tender Extremities: edema 1-2+ Pulses: 2+ and symmetric Skin: Skin color, texture, turgor normal. No rashes or lesions Neurologic: Grossly normal  Lab Results: Results for orders placed during the hospital encounter of 07/24/12 (from the past 48 hour(s))  CBC WITH DIFFERENTIAL     Status: Abnormal   Collection Time   07/24/12  9:34 AM      Component Value Range Comment   WBC 17.5 (*) 4.0 - 10.5 K/uL    RBC 3.28 (*) 3.87 - 5.11 MIL/uL    Hemoglobin 9.7 (*) 12.0 - 15.0 g/dL    HCT 16.1 (*) 09.6 - 46.0 %    MCV 95.4  78.0 - 100.0 fL    MCH 29.6  26.0 - 34.0 pg    MCHC 31.0  30.0 - 36.0 g/dL    RDW 04.5 (*) 40.9 - 15.5 %    Platelets 587 (*) 150 - 400 K/uL    Neutrophils Relative 73  43 - 77 %    Neutro Abs 12.8 (*) 1.7 - 7.7 K/uL    Lymphocytes Relative 16  12 - 46 %    Lymphs Abs 2.7  0.7 - 4.0 K/uL    Monocytes Relative 10  3 - 12 %    Monocytes Absolute 1.7 (*) 0.1 - 1.0 K/uL    Eosinophils Relative 1  0 - 5 %    Eosinophils Absolute 0.2  0.0 - 0.7 K/uL    Basophils Relative 0  0 - 1 %    Basophils Absolute 0.0  0.0 - 0.1 K/uL   COMPREHENSIVE METABOLIC PANEL     Status:  Abnormal   Collection Time  07/24/12  9:34 AM      Component Value Range Comment   Sodium 137  135 - 145 mEq/L    Potassium 3.7  3.5 - 5.1 mEq/L    Chloride 93 (*) 96 - 112 mEq/L    CO2 26  19 - 32 mEq/L    Glucose, Bld 99  70 - 99 mg/dL    BUN 53 (*) 6 - 23 mg/dL    Creatinine, Ser 1.61 (*) 0.50 - 1.10 mg/dL    Calcium 9.0  8.4 - 09.6 mg/dL    Total Protein 7.2  6.0 - 8.3 g/dL    Albumin 2.0 (*) 3.5 - 5.2 g/dL    AST 21  0 - 37 U/L    ALT 8  0 - 35 U/L    Alkaline Phosphatase 79  39 - 117 U/L    Total Bilirubin 0.3  0.3 - 1.2 mg/dL    GFR calc non Af Amer 13 (*) >90 mL/min    GFR calc Af Amer 15 (*) >90 mL/min   PROTIME-INR     Status: Normal   Collection Time   07/24/12  9:34 AM      Component Value Range Comment   Prothrombin Time 14.9  11.6 - 15.2 seconds    INR 1.19  0.00 - 1.49   APTT     Status: Normal   Collection Time   07/24/12  9:34 AM      Component Value Range Comment   aPTT 34  24 - 37 seconds   TROPONIN I     Status: Normal   Collection Time   07/24/12  9:34 AM      Component Value Range Comment   Troponin I <0.30  <0.30 ng/mL   PRO B NATRIURETIC PEPTIDE     Status: Abnormal   Collection Time   07/24/12  9:34 AM      Component Value Range Comment   Pro B Natriuretic peptide (BNP) 3114.0 (*) 0 - 450 pg/mL   LACTIC ACID, PLASMA     Status: Abnormal   Collection Time   07/24/12  9:35 AM      Component Value Range Comment   Lactic Acid, Venous 4.8 (*) 0.5 - 2.2 mmol/L   POCT I-STAT, CHEM 8     Status: Abnormal   Collection Time   07/24/12  9:46 AM      Component Value Range Comment   Sodium 137  135 - 145 mEq/L    Potassium 3.7  3.5 - 5.1 mEq/L    Chloride 99  96 - 112 mEq/L    BUN 55 (*) 6 - 23 mg/dL    Creatinine, Ser 0.45 (*) 0.50 - 1.10 mg/dL    Glucose, Bld 409 (*) 70 - 99 mg/dL    Calcium, Ion 8.11 (*) 1.13 - 1.30 mmol/L    TCO2 27  0 - 100 mmol/L    Hemoglobin 10.5 (*) 12.0 - 15.0 g/dL    HCT 91.4 (*) 78.2 - 46.0 %   MRSA PCR SCREENING      Status: Abnormal   Collection Time   07/24/12  1:03 PM      Component Value Range Comment   MRSA by PCR POSITIVE (*) NEGATIVE   TROPONIN I     Status: Normal   Collection Time   07/24/12  1:45 PM      Component Value Range Comment   Troponin I <0.30  <0.30 ng/mL   TSH  Status: Normal   Collection Time   07/24/12  1:45 PM      Component Value Range Comment   TSH 2.628  0.350 - 4.500 uIU/mL   MAGNESIUM     Status: Normal   Collection Time   07/24/12  1:45 PM      Component Value Range Comment   Magnesium 2.1  1.5 - 2.5 mg/dL   PRO B NATRIURETIC PEPTIDE     Status: Abnormal   Collection Time   07/24/12  1:45 PM      Component Value Range Comment   Pro B Natriuretic peptide (BNP) 4502.0 (*) 0 - 450 pg/mL   CK TOTAL AND CKMB     Status: Normal   Collection Time   07/24/12  1:45 PM      Component Value Range Comment   Total CK 142  7 - 177 U/L    CK, MB 2.6  0.3 - 4.0 ng/mL    Relative Index 1.8  0.0 - 2.5   PROTIME-INR     Status: Normal   Collection Time   07/24/12  1:45 PM      Component Value Range Comment   Prothrombin Time 15.2  11.6 - 15.2 seconds    INR 1.22  0.00 - 1.49   HEMOGLOBIN A1C     Status: Normal   Collection Time   07/24/12  1:45 PM      Component Value Range Comment   Hemoglobin A1C 4.8  <5.7 %    Mean Plasma Glucose 91  <117 mg/dL   URINALYSIS, ROUTINE W REFLEX MICROSCOPIC     Status: Abnormal   Collection Time   07/24/12  2:28 PM      Component Value Range Comment   Color, Urine YELLOW  YELLOW    APPearance CLOUDY (*) CLEAR    Specific Gravity, Urine 1.015  1.005 - 1.030    pH 5.0  5.0 - 8.0    Glucose, UA NEGATIVE  NEGATIVE mg/dL    Hgb urine dipstick NEGATIVE  NEGATIVE    Bilirubin Urine NEGATIVE  NEGATIVE    Ketones, ur NEGATIVE  NEGATIVE mg/dL    Protein, ur NEGATIVE  NEGATIVE mg/dL    Urobilinogen, UA 0.2  0.0 - 1.0 mg/dL    Nitrite NEGATIVE  NEGATIVE    Leukocytes, UA TRACE (*) NEGATIVE   URINE MICROSCOPIC-ADD ON     Status:  Abnormal   Collection Time   07/24/12  2:28 PM      Component Value Range Comment   Squamous Epithelial / LPF RARE  RARE    WBC, UA 0-2  <3 WBC/hpf    Bacteria, UA FEW (*) RARE    Casts HYALINE CASTS (*) NEGATIVE    Urine-Other MUCOUS PRESENT   AMORPHOUS URATES/PHOSPHATES  TROPONIN I     Status: Normal   Collection Time   07/24/12  6:10 PM      Component Value Range Comment   Troponin I <0.30  <0.30 ng/mL   HEPARIN LEVEL (UNFRACTIONATED)     Status: Abnormal   Collection Time   07/24/12 11:40 PM      Component Value Range Comment   Heparin Unfractionated <0.10 (*) 0.30 - 0.70 IU/mL   BASIC METABOLIC PANEL     Status: Abnormal   Collection Time   07/25/12  4:50 AM      Component Value Range Comment   Sodium 135  135 - 145 mEq/L    Potassium 3.5  3.5 -  5.1 mEq/L    Chloride 94 (*) 96 - 112 mEq/L    CO2 30  19 - 32 mEq/L    Glucose, Bld 92  70 - 99 mg/dL    BUN 57 (*) 6 - 23 mg/dL    Creatinine, Ser 3.08 (*) 0.50 - 1.10 mg/dL    Calcium 8.9  8.4 - 65.7 mg/dL    GFR calc non Af Amer 12 (*) >90 mL/min    GFR calc Af Amer 14 (*) >90 mL/min   LIPID PANEL     Status: Abnormal   Collection Time   07/25/12  4:50 AM      Component Value Range Comment   Cholesterol 139  0 - 200 mg/dL    Triglycerides 846  <962 mg/dL    HDL 22 (*) >95 mg/dL    Total CHOL/HDL Ratio 6.3      VLDL 20  0 - 40 mg/dL    LDL Cholesterol 97  0 - 99 mg/dL   PRO B NATRIURETIC PEPTIDE     Status: Abnormal   Collection Time   07/25/12  4:50 AM      Component Value Range Comment   Pro B Natriuretic peptide (BNP) 5378.0 (*) 0 - 450 pg/mL   CBC     Status: Abnormal   Collection Time   07/25/12  9:00 AM      Component Value Range Comment   WBC 17.8 (*) 4.0 - 10.5 K/uL    RBC 2.85 (*) 3.87 - 5.11 MIL/uL    Hemoglobin 8.5 (*) 12.0 - 15.0 g/dL REPEATED TO VERIFY   HCT 26.2 (*) 36.0 - 46.0 %    MCV 91.9  78.0 - 100.0 fL    MCH 29.8  26.0 - 34.0 pg    MCHC 32.4  30.0 - 36.0 g/dL    RDW 28.4 (*) 13.2 - 15.5 %     Platelets 536 (*) 150 - 400 K/uL   HEPARIN LEVEL (UNFRACTIONATED)     Status: Abnormal   Collection Time   07/25/12  9:00 AM      Component Value Range Comment   Heparin Unfractionated 0.17 (*) 0.30 - 0.70 IU/mL   HEPARIN LEVEL (UNFRACTIONATED)     Status: Abnormal   Collection Time   07/25/12  7:09 PM      Component Value Range Comment   Heparin Unfractionated 0.23 (*) 0.30 - 0.70 IU/mL   HEPARIN LEVEL (UNFRACTIONATED)     Status: Normal   Collection Time   07/26/12  5:49 AM      Component Value Range Comment   Heparin Unfractionated 0.31  0.30 - 0.70 IU/mL   CBC     Status: Abnormal   Collection Time   07/26/12  5:49 AM      Component Value Range Comment   WBC 15.5 (*) 4.0 - 10.5 K/uL    RBC 2.86 (*) 3.87 - 5.11 MIL/uL    Hemoglobin 8.3 (*) 12.0 - 15.0 g/dL    HCT 44.0 (*) 10.2 - 46.0 %    MCV 91.6  78.0 - 100.0 fL    MCH 29.0  26.0 - 34.0 pg    MCHC 31.7  30.0 - 36.0 g/dL    RDW 72.5 (*) 36.6 - 15.5 %    Platelets 561 (*) 150 - 400 K/uL   BASIC METABOLIC PANEL     Status: Abnormal   Collection Time   07/26/12  5:49 AM      Component Value  Range Comment   Sodium 135  135 - 145 mEq/L    Potassium 4.1  3.5 - 5.1 mEq/L    Chloride 95 (*) 96 - 112 mEq/L    CO2 30  19 - 32 mEq/L    Glucose, Bld 84  70 - 99 mg/dL    BUN 53 (*) 6 - 23 mg/dL    Creatinine, Ser 3.08 (*) 0.50 - 1.10 mg/dL    Calcium 8.7  8.4 - 65.7 mg/dL    GFR calc non Af Amer 16 (*) >90 mL/min    GFR calc Af Amer 18 (*) >90 mL/min   PROTIME-INR     Status: Normal   Collection Time   07/26/12  5:49 AM      Component Value Range Comment   Prothrombin Time 14.3  11.6 - 15.2 seconds    INR 1.13  0.00 - 1.49     Imaging: Dg Chest Port 1 View  07/25/2012  *RADIOLOGY REPORT*  Clinical Data: CHF  PORTABLE CHEST - 1 VIEW  Comparison: Prior chest x-ray yesterday, 07/24/2012  Findings: Worsening pulmonary vascular congestion and interstitial pulmonary edema.  Slightly enlarged bilateral pleural effusions and  increased bibasilar opacities.  Marked globular enlargement of the cardiopericardial silhouette has not significantly changed to over the past 24 hours.  IMPRESSION:  1.  Worsening pulmonary edema/CHF 2.  Marked globular enlargement the cardiopericardial silhouette may represent a combination of cardiomegaly and moderately large pericardial effusion. 3.  Slightly enlarged bilateral pleural effusions and bibasilar opacities likely reflecting a combination of pleural fluid, edema and atelectasis.   Original Report Authenticated By: Sterling Big, M.D.     Assessment:  1. Principal Problem: 2.  *Sustained SVT, with need for emergent DCCV 07/24/12 3. Active Problems: 4.  Pulmonary embolism, Oct 2012 5.  CKD (chronic kidney disease) stage 4, GFR 15-29 ml/min 6.  Acute on chronic diastolic congestive heart failure -- secondary to rapid SVT, aFib RVR 7.  Type II or unspecified type diabetes mellitus without mention of complication, "Diet" controlled 8.  Presence of IVC filter 9.  Aortic stenosis, moderate; Mean gradient of by cath 10.  Anemia, unspecified 11.  SOB (shortness of breath) 12.  Hypotensive episode, secondary to SVT with HR 200 13.  Respiratory failure, acute, secondary to tachycardia, diastolic HF 14.  Chronic diastolic heart failure 15.   Plan:  1. I suspect the cause of her decompensation may have been a new small/medium size pulmonary embolism with left lung infarction. This may explain the very rapid rate with AF. Alternatively, her chest discomfort may be related to the multiple cardioversion shocks. 2. She has an IVC filter and at previous attempt to anticoagulate, developed vaginal bleeding.At very high risk for recurrent thromboembolic venous and arterial complications due to hx DVT/PE, filter in situ, atrial fibrillation, obesity and very limited mobility. Will give anticoagulation another try. Risk for re-bleeding is also at least moderately elevated. Monitor H/H.  T&S. 3. Still mildly tachypneic and BNP was very  High compared to her baseline of around [phone]. Continue diuretics. Renal function is improving. 4. Cardiomegaly on CXR is not changed from multiple previous x-rays when she had echo that did not show pericardial effusion.  Time Spent Directly with Patient:  30 minutes  Length of Stay:  LOS: 2 days   Thurmon Fair, MD, [facility] [phone] 07/26/2012, 9:31 AM

## 2012-07-26 NOTE — Progress Notes (Signed)
ANTICOAGULATION CONSULT NOTE - Follow Up Consult  Pharmacy Consult for Warfarin Indication: atrial fibrillation  Labs:  Basename 07/26/12 0954 07/26/12 0549 07/25/12 1909 07/25/12 0900 07/25/12 0450 07/24/12 1810 07/24/12 1345 07/24/12 0946 07/24/12 0934  HGB 8.7* 8.3* -- -- -- -- -- -- --  HCT 27.0* 26.2* -- 26.2* -- -- -- -- --  PLT -- 561* -- 536* -- -- -- -- 587*  APTT -- -- -- -- -- -- -- -- 34  LABPROT -- 14.3 -- -- -- -- 15.2 -- 14.9  INR -- 1.13 -- -- -- -- 1.22 -- 1.19  HEPARINUNFRC -- 0.31 0.23* 0.17* -- -- -- -- --  CREATININE -- 2.73* -- -- 3.29* -- -- 3.10* --  CKTOTAL -- -- -- -- -- -- 142 -- --  CKMB -- -- -- -- -- -- 2.6 -- --  TROPONINI -- -- -- -- -- <0.30 <0.30 -- <0.30   Assessment: 78yo female with history of A-fib/SVT, on coumadin and heparin. INR below goal.  She has a history of GI and vaginal bleeding with past Warfarin use.  We will up-titrate her regimen slowly and aim for lower end of goal range (2.0-2.5).  Warf Education: initiated (Warfarin book, and video ordered) for patient review.  Drug/Drug Interactions:   Effects: Amiodarone may increase the hypoprothrombinemic effect of warfarin. The severity of this interaction may depend on the dosage of amiodarone. Bleeding may occur.   Goal of Therapy:  INR 2.0-2.5 Monitor platelets by anticoagulation protocol: Yes   Plan:  Coumadin 5mg  x 1 dose po tonight. Daily PT/INR.  Wendie Simmer, PharmD, BCPS Clinical Pharmacist  Pager: [phone]

## 2012-07-26 NOTE — Progress Notes (Signed)
Pt converted to NSR at approx 1339.  Corine Shelter, [place] notified of conversion.  Ordered to continue cardizem gtt.  Nayali Talerico, [facility]

## 2012-07-27 DIAGNOSIS — D649 Anemia, unspecified: Secondary | ICD-10-CM | POA: Diagnosis present

## 2012-07-27 LAB — CBC
HCT: 26.5 % — ABNORMAL LOW (ref 36.0–46.0)
Hemoglobin: 8.5 g/dL — ABNORMAL LOW (ref 12.0–15.0)
MCH: 29.6 pg (ref 26.0–34.0)
MCHC: 32.1 g/dL (ref 30.0–36.0)
MCV: 92.3 fL (ref 78.0–100.0)
Platelets: 567 10*3/uL — ABNORMAL HIGH (ref 150–400)
RBC: 2.87 MIL/uL — ABNORMAL LOW (ref 3.87–5.11)
RDW: 16.2 % — ABNORMAL HIGH (ref 11.5–15.5)
WBC: 16.9 10*3/uL — ABNORMAL HIGH (ref 4.0–10.5)

## 2012-07-27 LAB — HEPARIN LEVEL (UNFRACTIONATED): Heparin Unfractionated: 0.47 IU/mL (ref 0.30–0.70)

## 2012-07-27 LAB — BASIC METABOLIC PANEL
BUN: 48 mg/dL — ABNORMAL HIGH (ref 6–23)
Creatinine, Ser: 2.57 mg/dL — ABNORMAL HIGH (ref 0.50–1.10)
GFR calc Af Amer: 19 mL/min — ABNORMAL LOW (ref >90)
GFR calc non Af Amer: 17 mL/min — ABNORMAL LOW (ref >90)
Potassium: 4.3 mEq/L (ref 3.5–5.1)

## 2012-07-27 LAB — PRO B NATRIURETIC PEPTIDE: Pro B Natriuretic peptide (BNP): 4425 pg/mL — ABNORMAL HIGH (ref 0–450)

## 2012-07-27 LAB — PROTIME-INR
INR: 1.24 (ref 0.00–1.49)
Prothrombin Time: 15.4 seconds — ABNORMAL HIGH (ref 11.6–15.2)

## 2012-07-27 MED ORDER — WARFARIN SODIUM 5 MG PO TABS
5.0000 mg | ORAL_TABLET | Freq: Once | ORAL | Status: AC
  Administered 2012-07-27: 5 mg via ORAL
  Filled 2012-07-27: qty 1

## 2012-07-27 MED ORDER — DILTIAZEM HCL ER COATED BEADS 120 MG PO CP24
120.0000 mg | ORAL_CAPSULE | Freq: Every day | ORAL | Status: DC
  Administered 2012-07-27 – 2012-08-08 (×12): 120 mg via ORAL
  Filled 2012-07-27 (×13): qty 1

## 2012-07-27 MED ORDER — ALPRAZOLAM 0.25 MG PO TABS
0.2500 mg | ORAL_TABLET | Freq: Three times a day (TID) | ORAL | Status: DC | PRN
  Administered 2012-07-27 – 2012-08-02 (×3): 0.25 mg via ORAL
  Filled 2012-07-27 (×3): qty 1

## 2012-07-27 MED ORDER — ZOLPIDEM TARTRATE 5 MG PO TABS
5.0000 mg | ORAL_TABLET | Freq: Every day | ORAL | Status: DC
Start: 2012-07-27 — End: 2012-08-12
  Administered 2012-07-27 – 2012-08-11 (×16): 5 mg via ORAL
  Filled 2012-07-27 (×16): qty 1

## 2012-07-27 NOTE — Progress Notes (Signed)
Subjective:  PSVT yesterday with rapid VR, converted to NSR after IV Cardizem added.  Objective:  Vital Signs in the last 24 hours: Temp:  [98.9 F (37.2 C)-99.1 F (37.3 C)] 98.9 F (37.2 C) (10/13 0500) Pulse Rate:  [79-83] 79  (10/13 0500) Resp:  [18] 18  (10/13 0500) BP: (101-105)/(55-59) 105/59 mmHg (10/13 0500) SpO2:  [100 %] 100 % (10/13 0500) Weight:  [981.125 kg (251 lb 9.6 oz)] 114.125 kg (251 lb 9.6 oz) (10/13 0500)  Intake/Output from previous day:  Intake/Output Summary (Last 24 hours) at 07/27/12 0859 Last data filed at 07/27/12 0500  Gross per 24 hour  Intake 1400.09 ml  Output   1800 ml  Net -399.91 ml    Physical Exam: General appearance: alert, cooperative, no distress and moderately obese Lungs: decreased breath sounds at bases Heart: regular rate and rhythm and 2/6 systolic murmur LSB   Rate: 80  Rhythm: normal sinus rhythm  Lab Results:  Basename 07/27/12 0650 07/26/12 0954 07/26/12 0549  WBC 16.9* -- 15.5*  HGB 8.5* 8.7* --  PLT 567* -- 561*    Basename 07/27/12 0650 07/26/12 0549  NA 135 135  K 4.3 4.1  CL 95* 95*  CO2 30 30  GLUCOSE 91 84  BUN 48* 53*  CREATININE 2.57* 2.73*    Basename 07/24/12 1810 07/24/12 1345  TROPONINI <0.30 <0.30   Hepatic Function Panel  Basename 07/24/12 0934  PROT 7.2  ALBUMIN 2.0*  AST 21  ALT 8  ALKPHOS 79  BILITOT 0.3  BILIDIR --  IBILI --    Basename 07/25/12 0450  CHOL 139    Basename 07/27/12 0650  INR 1.24    Imaging: Imaging results have been reviewed  Cardiac Studies:  Assessment/Plan:   Principal Problem:  *Sustained SVT, with need for emergent DCCV 07/24/12 Active Problems:  Acute on chronic diastolic congestive heart failure -- secondary to rapid SVT, aFib RVR  Respiratory failure, acute, secondary to tachycardia, diastolic HF  Pulmonary embolism, Oct 2012, suspected recurrence 07/24/12  CKD (chronic kidney disease) stage 4, GFR 15-29 ml/min  Presence of IVC filter  H/O: GI bleed, severe (re-challenge with Coumadin this admission secondary to recuurent PE with IVCF)  CAD (coronary artery disease) - Mild-moderate with ~30% RCA lesion by cath  SOB (shortness of breath)  Hypotensive episode, secondary to SVT with HR 200  Chronic diastolic heart failure  Anemia  Hypertension  GERD (gastroesophageal reflux disease)  Lymphedema  Type II or unspecified type diabetes mellitus without mention of complication, "Diet" controlled  Aortic stenosis, moderate; Mean gradient of by cath   Plan- Change Diltiazem to PO, continue Heparin to Coumadin, follow Hgb stool guaiacs.   Corine Shelter PA-C 07/27/2012, 8:59 AM  I have seen and examined the patient along with Corine Shelter PA-C.  I have reviewed the chart, notes and new data.  I agree with PA's note.  Adjusting rate control meds and anticoagulants. No overt bleeding so far.  Thurmon Fair, MD, [facility] [phone] 07/27/2012, 9:55 AM

## 2012-07-27 NOTE — Progress Notes (Signed)
ANTICOAGULATION CONSULT NOTE - Follow Up Consult  Pharmacy Consult for Warfarin Indication: atrial fibrillation  Labs:  Basename 07/27/12 0650 07/26/12 1515 07/26/12 0954 07/26/12 0549 07/25/12 0900 07/25/12 0450 07/24/12 1810 07/24/12 1345 07/24/12 0934  HGB 8.5* -- 8.7* -- -- -- -- -- --  HCT 26.5* -- 27.0* 26.2* -- -- -- -- --  PLT 567* -- -- 561* 536* -- -- -- --  APTT -- -- -- -- -- -- -- -- 34  LABPROT 15.4* -- -- 14.3 -- -- -- 15.2 --  INR 1.24 -- -- 1.13 -- -- -- 1.22 --  HEPARINUNFRC 0.47 0.38 -- 0.31 -- -- -- -- --  CREATININE 2.57* -- -- 2.73* -- 3.29* -- -- --  CKTOTAL -- -- -- -- -- -- -- 142 --  CKMB -- -- -- -- -- -- -- 2.6 --  TROPONINI -- -- -- -- -- -- <0.30 <0.30 <0.30   Assessment: 78yo female with history of A-fib/SVT, on coumadin and heparin. Heparin level is at goal.  INR is subtherapeutic. No bleeding reported.   Goal of Therapy:  Heparin level= 0.3- 0.7 INR 2.0-2.5 Monitor platelets by anticoagulation protocol: Yes   Plan:  Continue heparin at 2050 units/hr. Coumadin 5mg  po x 1 tonight.   Daily PT/INR, heparin level/CBC.

## 2012-07-28 LAB — BASIC METABOLIC PANEL
BUN: 44 mg/dL — ABNORMAL HIGH (ref 6–23)
Calcium: 8.8 mg/dL (ref 8.4–10.5)
Creatinine, Ser: 2.43 mg/dL — ABNORMAL HIGH (ref 0.50–1.10)
GFR calc Af Amer: 21 mL/min — ABNORMAL LOW (ref >90)
GFR calc non Af Amer: 18 mL/min — ABNORMAL LOW (ref >90)
Glucose, Bld: 78 mg/dL (ref 70–99)

## 2012-07-28 LAB — CBC
HCT: 24 % — ABNORMAL LOW (ref 36.0–46.0)
Hemoglobin: 7.6 g/dL — ABNORMAL LOW (ref 12.0–15.0)
MCH: 29.1 pg (ref 26.0–34.0)
MCHC: 31.7 g/dL (ref 30.0–36.0)
RDW: 16.3 % — ABNORMAL HIGH (ref 11.5–15.5)

## 2012-07-28 LAB — HEMOGLOBIN AND HEMATOCRIT, BLOOD: HCT: 27.4 % — ABNORMAL LOW (ref 36.0–46.0)

## 2012-07-28 MED ORDER — HEPARIN (PORCINE) IN NACL 100-0.45 UNIT/ML-% IJ SOLN
2450.0000 [IU]/h | INTRAMUSCULAR | Status: DC
Start: 2012-07-28 — End: 2012-07-30
  Administered 2012-07-28 – 2012-07-30 (×4): 2450 [IU]/h via INTRAVENOUS
  Filled 2012-07-28 (×8): qty 250

## 2012-07-28 MED ORDER — WARFARIN SODIUM 5 MG PO TABS
5.0000 mg | ORAL_TABLET | Freq: Once | ORAL | Status: AC
  Administered 2012-07-28: 5 mg via ORAL
  Filled 2012-07-28: qty 1

## 2012-07-28 NOTE — Progress Notes (Signed)
ANTICOAGULATION CONSULT NOTE - Follow Up Consult  Pharmacy Consult for Heparin Indication: atrial fibrillation  Labs:  Basename 07/28/12 1620 07/28/12 0533 07/27/12 0650 07/26/12 0549  HGB 8.9* 7.6* -- --  HCT 27.4* 24.0* 26.5* --  PLT -- 529* 567* 561*  APTT -- -- -- --  LABPROT -- 16.6* 15.4* 14.3  INR -- 1.38 1.24 1.13  HEPARINUNFRC 0.17* 0.21* 0.47 --  CREATININE -- 2.43* 2.57* 2.73*  CKTOTAL -- -- -- --  CKMB -- -- -- --  TROPONINI -- -- -- --    Assessment: 78yo female on heparin for afib. Level still subtherapeutic.  Goal of Therapy:  Heparin level 0.3-0.7 units/ml INR = 2 to 2.5   Plan:   Increase heparin to 2450 units/hr F/u with 8 hr level

## 2012-07-28 NOTE — Progress Notes (Signed)
[facility]  DAILY PROGRESS NOTE   Subjective:  Chest pain has resolved completely. No dyspnea. Still has brief episodes of PF - HR around 140, lasting just a few minutes. No overt bleeding. Slight drop in hemoglobin.  Objective:  Temp:  [97.3 F (36.3 C)-98.9 F (37.2 C)] 97.3 F (36.3 C) (10/14 0600) Pulse Rate:  [77-87] 77  (10/14 0600) Resp:  [15-18] 15  (10/14 0600) BP: (111-115)/(50-56) 111/52 mmHg (10/14 0900) SpO2:  [94 %-100 %] 94 % (10/14 0600) Weight:  [113.8 kg (250 lb 14.1 oz)] 113.8 kg (250 lb 14.1 oz) (10/14 0600) Weight change: -0.325 kg (-11.5 oz)  Intake/Output from previous day: 10/13 0701 - 10/14 0700 In: 2002.5 [P.O.:1240; I.V.:762.5] Out: 1900 [Urine:1900]  Intake/Output from this shift:    Medications: Current Facility-Administered Medications  Medication Dose Route Frequency Provider Last Rate Last Dose  . 0.9 %  sodium chloride infusion   Intravenous Continuous Nada Boozer, NP 10 mL/hr at 07/27/12 0420 10 mL/hr at 07/27/12 0420  . 0.9 %  sodium chloride infusion   Intravenous Continuous Marykay Lex, MD 10 mL/hr at 07/25/12 [phone]    . acetaminophen (TYLENOL) tablet 650 mg  650 mg Oral Q4H PRN Nada Boozer, NP   650 mg at 07/24/12 2105  . albuterol (PROVENTIL HFA;VENTOLIN HFA) 108 (90 BASE) MCG/ACT inhaler 2 puff  2 puff Inhalation Q6H PRN Nada Boozer, NP      . ALPRAZolam Prudy Feeler) tablet 0.25 mg  0.25 mg Oral Q8H PRN Abelino Derrick, PA   0.25 mg at 07/27/12 2956  . alum & mag hydroxide-simeth (MAALOX/MYLANTA) 200-200-20 MG/5ML suspension 30 mL  30 mL Oral Q4H PRN Nada Boozer, NP   30 mL at 07/24/12 2139  . amiodarone (PACERONE) tablet 400 mg  400 mg Oral BID Marykay Lex, MD   400 mg at 07/27/12 2117  . aspirin EC tablet 81 mg  81 mg Oral Daily Nada Boozer, NP   81 mg at 07/27/12 1119  . Chlorhexidine Gluconate Cloth 2 % PADS 6 each  6 each Topical Q0600 Nada Boozer, NP   6 each at 07/27/12 0543  .  cholecalciferol (VITAMIN D) tablet 2,000 Units  2,000 Units Oral Daily Nada Boozer, NP   2,000 Units at 07/27/12 1119  . diltiazem (CARDIZEM CD) 24 hr capsule 120 mg  120 mg Oral Daily Eda Paschal [place], [place]   120 mg at 07/27/12 1440  . docusate sodium (COLACE) capsule 200 mg  200 mg Oral BID Nada Boozer, NP   200 mg at 07/27/12 2117  . ezetimibe (ZETIA) tablet 10 mg  10 mg Oral Daily Nada Boozer, NP   10 mg at 07/27/12 1119  . feeding supplement (ENSURE COMPLETE) liquid 237 mL  237 mL Oral BID BM Chrystie Nose, MD   237 mL at 07/27/12 1440  . furosemide (LASIX) tablet 80 mg  80 mg Oral BID Marykay Lex, MD   80 mg at 07/28/12 0903  . heparin ADULT infusion 100 units/mL (25000 units/250 mL)  2,200 Units/hr Intravenous Continuous Colleen Can, PHARMD 22 mL/hr at 07/28/12 0908 2,200 Units/hr at 07/28/12 0908  . HYDROcodone-acetaminophen (NORCO/VICODIN) 5-325 MG per tablet 1 tablet  1 tablet Oral Q8H PRN Nada Boozer, NP   1 tablet at 07/28/12 0610  . loratadine (CLARITIN) tablet 10 mg  10 mg Oral Daily Nada Boozer, NP   10 mg at 07/27/12 1118  . metoprolol succinate (TOPROL-XL) 24 hr tablet  25 mg  25 mg Oral Daily Nada Boozer, NP   25 mg at 07/27/12 1118  . mupirocin ointment (BACTROBAN) 2 % 1 application  1 application Nasal BID Nada Boozer, NP   1 application at 07/27/12 2118  . nitroGLYCERIN (NITROSTAT) SL tablet 0.4 mg  0.4 mg Sublingual Q5 Min x 3 PRN Nada Boozer, NP      . ondansetron [facility]) injection 4 mg  4 mg Intravenous Q6H PRN Nada Boozer, NP      . pantoprazole (PROTONIX) EC tablet 40 mg  40 mg Oral Q1200 Nada Boozer, NP   40 mg at 07/27/12 1440  . polyethylene glycol (MIRALAX / GLYCOLAX) packet 17 g  17 g Oral BID Nada Boozer, NP   17 g at 07/27/12 1118  . senna (SENOKOT) tablet 8.6 mg  1 tablet Oral Daily Nada Boozer, NP   8.6 mg at 07/27/12 1118  . warfarin (COUMADIN) tablet 5 mg  5 mg Oral ONCE-1800 Chrystie Nose, MD   5 mg at 07/27/12 1746  . warfarin  (COUMADIN) tablet 5 mg  5 mg Oral ONCE-1800 Chrystie Nose, MD      . Warfarin - Pharmacist Dosing Inpatient   Does not apply q1800 Chinita Greenland, PHARMD      . zolpidem Remus Loffler) tablet 5 mg  5 mg Oral QHS Eda Paschal [place], [place]   5 mg at 07/27/12 2117    Physical Exam: General appearance: alert, cooperative and no distress Neck: no adenopathy, no carotid bruit, supple, symmetrical, trachea midline, thyroid not enlarged, symmetric, no tenderness/mass/nodules and cannot evaluate JVP Lungs: clear to auscultation bilaterally Heart: regular rate and rhythm, S1, S2 normal, systolic murmur: holosystolic 2/6,   at lower left sternal border and 2nd systolic murmur: systolic ejection 2/6, crescendo and decrescendo at 2nd right intercostal space Abdomen: severely obese, no obvious abnormalities Extremities: edema 3+ Neurologic: Grossly normal  Lab Results: Results for orders placed during the hospital encounter of 07/24/12 (from the past 48 hour(s))  TYPE AND SCREEN     Status: Normal   Collection Time   07/26/12  9:54 AM      Component Value Range Comment   ABO/RH(D) O POS      Antibody Screen NEG      Sample Expiration 07/29/2012     HEMOGLOBIN AND HEMATOCRIT, BLOOD     Status: Abnormal   Collection Time   07/26/12  9:54 AM      Component Value Range Comment   Hemoglobin 8.7 (*) 12.0 - 15.0 g/dL    HCT 45.4 (*) 09.8 - 46.0 %   ABO/RH     Status: Normal   Collection Time   07/26/12  9:54 AM      Component Value Range Comment   ABO/RH(D) O POS     HEPARIN LEVEL (UNFRACTIONATED)     Status: Normal   Collection Time   07/26/12  3:15 PM      Component Value Range Comment   Heparin Unfractionated 0.38  0.30 - 0.70 IU/mL   HEPARIN LEVEL (UNFRACTIONATED)     Status: Normal   Collection Time   07/27/12  6:50 AM      Component Value Range Comment   Heparin Unfractionated 0.47  0.30 - 0.70 IU/mL   CBC     Status: Abnormal   Collection Time   07/27/12  6:50 AM      Component Value Range  Comment   WBC 16.9 (*) 4.0 - 10.5 K/uL  RBC 2.87 (*) 3.87 - 5.11 MIL/uL    Hemoglobin 8.5 (*) 12.0 - 15.0 g/dL    HCT 16.1 (*) 09.6 - 46.0 %    MCV 92.3  78.0 - 100.0 fL    MCH 29.6  26.0 - 34.0 pg    MCHC 32.1  30.0 - 36.0 g/dL    RDW 04.5 (*) 40.9 - 15.5 %    Platelets 567 (*) 150 - 400 K/uL   BASIC METABOLIC PANEL     Status: Abnormal   Collection Time   07/27/12  6:50 AM      Component Value Range Comment   Sodium 135  135 - 145 mEq/L    Potassium 4.3  3.5 - 5.1 mEq/L    Chloride 95 (*) 96 - 112 mEq/L    CO2 30  19 - 32 mEq/L    Glucose, Bld 91  70 - 99 mg/dL    BUN 48 (*) 6 - 23 mg/dL    Creatinine, Ser 8.11 (*) 0.50 - 1.10 mg/dL    Calcium 9.1  8.4 - 91.4 mg/dL    GFR calc non Af Amer 17 (*) >90 mL/min    GFR calc Af Amer 19 (*) >90 mL/min   PROTIME-INR     Status: Abnormal   Collection Time   07/27/12  6:50 AM      Component Value Range Comment   Prothrombin Time 15.4 (*) 11.6 - 15.2 seconds    INR 1.24  0.00 - 1.49   PRO B NATRIURETIC PEPTIDE     Status: Abnormal   Collection Time   07/27/12  6:50 AM      Component Value Range Comment   Pro B Natriuretic peptide (BNP) 4425.0 (*) 0 - 450 pg/mL   HEPARIN LEVEL (UNFRACTIONATED)     Status: Abnormal   Collection Time   07/28/12  5:33 AM      Component Value Range Comment   Heparin Unfractionated 0.21 (*) 0.30 - 0.70 IU/mL   CBC     Status: Abnormal   Collection Time   07/28/12  5:33 AM      Component Value Range Comment   WBC 16.1 (*) 4.0 - 10.5 K/uL    RBC 2.61 (*) 3.87 - 5.11 MIL/uL    Hemoglobin 7.6 (*) 12.0 - 15.0 g/dL    HCT 78.2 (*) 95.6 - 46.0 %    MCV 92.0  78.0 - 100.0 fL    MCH 29.1  26.0 - 34.0 pg    MCHC 31.7  30.0 - 36.0 g/dL    RDW 21.3 (*) 08.6 - 15.5 %    Platelets 529 (*) 150 - 400 K/uL   BASIC METABOLIC PANEL     Status: Abnormal   Collection Time   07/28/12  5:33 AM      Component Value Range Comment   Sodium 134 (*) 135 - 145 mEq/L    Potassium 4.8  3.5 - 5.1 mEq/L    Chloride 94  (*) 96 - 112 mEq/L    CO2 30  19 - 32 mEq/L    Glucose, Bld 78  70 - 99 mg/dL    BUN 44 (*) 6 - 23 mg/dL    Creatinine, Ser 5.78 (*) 0.50 - 1.10 mg/dL    Calcium 8.8  8.4 - 46.9 mg/dL    GFR calc non Af Amer 18 (*) >90 mL/min    GFR calc Af Amer 21 (*) >90 mL/min   PROTIME-INR  Status: Abnormal   Collection Time   07/28/12  5:33 AM      Component Value Range Comment   Prothrombin Time 16.6 (*) 11.6 - 15.2 seconds    INR 1.38  0.00 - 1.49   PRO B NATRIURETIC PEPTIDE     Status: Abnormal   Collection Time   07/28/12  5:33 AM      Component Value Range Comment   Pro B Natriuretic peptide (BNP) 3849.0 (*) 0 - 450 pg/mL     Imaging: No results found.  Assessment:  1. Principal Problem: 2.  *Sustained SVT, with need for emergent DCCV 07/24/12 3. Active Problems: 4.  Pulmonary embolism, Oct 2012, suspected recurrence 07/24/12 5.  CKD (chronic kidney disease) stage 4, GFR 15-29 ml/min 6.  Hypertension 7.  GERD (gastroesophageal reflux disease) 8.  Acute on chronic diastolic congestive heart failure -- secondary to rapid SVT, aFib RVR 9.  Lymphedema 10.  Type II or unspecified type diabetes mellitus without mention of complication, "Diet" controlled 11.  Presence of IVC filter 12.  Aortic stenosis, moderate; Mean gradient of by cath 13.  H/O: GI bleed, severe (re-challenge with Coumadin this admission secondary to recuurent PE with IVCF) 14.  CAD (coronary artery disease) - Mild-moderate with ~30% RCA lesion by cath 15.  SOB (shortness of breath) 16.  Hypotensive episode, secondary to SVT with HR 200 17.  Respiratory failure, acute, secondary to tachycardia, diastolic HF 18.  Chronic diastolic heart failure 19.  Anemia 20.   Plan:  1. Continue oral anticoagulants/heparin transition and amiodarone. Physical therapy. Recheck Hgb at 2 PM.  Time Spent Directly with Patient:  30 minutes  Length of Stay:  LOS: 4 days    Christine [name] 07/28/2012, 9:37  AM

## 2012-07-28 NOTE — Progress Notes (Addendum)
ANTICOAGULATION CONSULT NOTE - Follow Up Consult  Pharmacy Consult for Heparin / Coumadin Indication: atrial fibrillation  Labs:  Basename 07/28/12 0533 07/27/12 0650 07/26/12 1515 07/26/12 0954 07/26/12 0549  HGB 7.6* 8.5* -- -- --  HCT 24.0* 26.5* -- 27.0* --  PLT 529* 567* -- -- 561*  APTT -- -- -- -- --  LABPROT 16.6* 15.4* -- -- 14.3  INR 1.38 1.24 -- -- 1.13  HEPARINUNFRC 0.21* 0.47 0.38 -- --  CREATININE -- 2.57* -- -- 2.73*  CKTOTAL -- -- -- -- --  CKMB -- -- -- -- --  TROPONINI -- -- -- -- --    Assessment: 78yo female now subtherapeutic on heparin after three levels at goal for Afib; heparin bag was changed last night which could account for some variability.  CBC decreased - HgB down to 7.6  INR = 1.38   Goal of Therapy:  Heparin level 0.3-0.7 units/ml INR = 2 to 2.5   Plan:  Will increase heparin gtt by 1-2 units/kg/hr to 2200 units/hr and check level in 8hr. Coumadin 5 mg po x 1 today Watch CBC  Thank you. Okey Regal, PharmD [phone] 07/28/2012,6:47 AM

## 2012-07-29 LAB — PROTIME-INR
INR: 1.52 — ABNORMAL HIGH (ref 0.00–1.49)
INR: 1.55 — ABNORMAL HIGH (ref 0.00–1.49)
Prothrombin Time: 17.9 seconds — ABNORMAL HIGH (ref 11.6–15.2)

## 2012-07-29 LAB — BASIC METABOLIC PANEL
BUN: 46 mg/dL — ABNORMAL HIGH (ref 6–23)
BUN: 43 mg/dL — ABNORMAL HIGH (ref 6–23)
CO2: 31 mEq/L (ref 19–32)
CO2: 30 mEq/L (ref 19–32)
Chloride: 94 mEq/L — ABNORMAL LOW (ref 96–112)
Chloride: 92 mEq/L — ABNORMAL LOW (ref 96–112)
GFR calc Af Amer: 19 mL/min — ABNORMAL LOW (ref >90)
Glucose, Bld: 89 mg/dL (ref 70–99)
Glucose, Bld: 109 mg/dL — ABNORMAL HIGH (ref 70–99)
Potassium: 5 mEq/L (ref 3.5–5.1)
Potassium: 4.8 mEq/L (ref 3.5–5.1)

## 2012-07-29 LAB — CBC
HCT: 25.8 % — ABNORMAL LOW (ref 36.0–46.0)
HCT: 26.7 % — ABNORMAL LOW (ref 36.0–46.0)
Hemoglobin: 8.3 g/dL — ABNORMAL LOW (ref 12.0–15.0)
Hemoglobin: 8.4 g/dL — ABNORMAL LOW (ref 12.0–15.0)
RBC: 2.79 MIL/uL — ABNORMAL LOW (ref 3.87–5.11)
RDW: 16.1 % — ABNORMAL HIGH (ref 11.5–15.5)
WBC: 17.1 10*3/uL — ABNORMAL HIGH (ref 4.0–10.5)
WBC: 17.9 10*3/uL — ABNORMAL HIGH (ref 4.0–10.5)

## 2012-07-29 MED ORDER — WARFARIN SODIUM 5 MG PO TABS
5.0000 mg | ORAL_TABLET | Freq: Once | ORAL | Status: AC
  Administered 2012-07-29: 5 mg via ORAL
  Filled 2012-07-29: qty 1

## 2012-07-29 NOTE — Progress Notes (Addendum)
ANTICOAGULATION CONSULT NOTE - Follow Up Consult  Pharmacy Consult for heparin / Coumadin Indication: atrial fibrillation / ? PE  Labs:  Basename 07/29/12 0140 07/28/12 1620 07/28/12 0533 07/27/12 0650 07/26/12 0549  HGB 8.3* 8.9* -- -- --  HCT 25.8* 27.4* 24.0* -- --  PLT 538* -- 529* 567* --  APTT -- -- -- -- --  LABPROT 17.9* -- 16.6* 15.4* --  INR 1.52* -- 1.38 1.24 --  HEPARINUNFRC 0.50 0.17* 0.21* -- --  CREATININE -- -- 2.43* 2.57* 2.73*  CKTOTAL -- -- -- -- --  CKMB -- -- -- -- --  TROPONINI -- -- -- -- --    Assessment/Plan: 78yo female now therapeutic on heparin.  INR increasing on 5 mg doses.  Continue heparin at 2450 units / hr  Coumadin 5 mg po x 1 dose today Continue to watch CBC  Thank you. Okey Regal, PharmD [phone]

## 2012-07-29 NOTE — Progress Notes (Signed)
Pt. Seen and examined. Agree with the NP/PA-C note as written.  Seems to be tolerating amiodarone and maintaining sinus. HGB stable. INR remains low, would suggest to pharmacy increased dose 7.5 mg x 1 tonight. Cover with heparin until INR >2.0. BNP is improving with diuresis.   Chrystie Nose, MD, [facility] Attending Cardiologist [facility]

## 2012-07-29 NOTE — Progress Notes (Signed)
[facility]  Subjective: Both shoulders are sore.  Objective: Vital signs in last 24 hours: Temp:  [97.4 F (36.3 C)-98.7 F (37.1 C)] 98.1 F (36.7 C) (10/15 0500) Pulse Rate:  [77-84] 77  (10/15 0500) Resp:  [18-20] 18  (10/15 0500) BP: (116-122)/(49-67) 120/61 mmHg (10/15 0500) SpO2:  [98 %-100 %] 98 % (10/15 0500) Weight:  [110.3 kg (243 lb 2.7 oz)] 110.3 kg (243 lb 2.7 oz) (10/15 0500) Last BM Date: 07/27/12  Intake/Output from previous day: 10/14 0701 - 10/15 0700 In: 300 [P.O.:300] Out: 2500 [Urine:2500] Intake/Output this shift:    Medications Current Facility-Administered Medications  Medication Dose Route Frequency Provider Last Rate Last Dose  . 0.9 %  sodium chloride infusion   Intravenous Continuous Nada Boozer, NP 10 mL/hr at 07/29/12 0655    . 0.9 %  sodium chloride infusion   Intravenous Continuous Marykay Lex, MD 10 mL/hr at 07/25/12 [phone]    . acetaminophen (TYLENOL) tablet 650 mg  650 mg Oral Q4H PRN Nada Boozer, NP   650 mg at 07/29/12 0216  . albuterol (PROVENTIL HFA;VENTOLIN HFA) 108 (90 BASE) MCG/ACT inhaler 2 puff  2 puff Inhalation Q6H PRN Nada Boozer, NP      . ALPRAZolam Prudy Feeler) tablet 0.25 mg  0.25 mg Oral Q8H PRN Abelino Derrick, PA   0.25 mg at 07/27/12 6962  . alum & mag hydroxide-simeth (MAALOX/MYLANTA) 200-200-20 MG/5ML suspension 30 mL  30 mL Oral Q4H PRN Nada Boozer, NP   30 mL at 07/24/12 2139  . amiodarone (PACERONE) tablet 400 mg  400 mg Oral BID Marykay Lex, MD   400 mg at 07/28/12 2133  . aspirin EC tablet 81 mg  81 mg Oral Daily Nada Boozer, NP   81 mg at 07/28/12 1012  . Chlorhexidine Gluconate Cloth 2 % PADS 6 each  6 each Topical Q0600 Nada Boozer, NP   6 each at 07/29/12 0654  . cholecalciferol (VITAMIN D) tablet 2,000 Units  2,000 Units Oral Daily Nada Boozer, NP   2,000 Units at 07/28/12 1013  . diltiazem (CARDIZEM CD) 24 hr capsule 120 mg  120 mg Oral Daily Eda Paschal [place], [place]   120 mg at  07/28/12 1009  . docusate sodium (COLACE) capsule 200 mg  200 mg Oral BID Nada Boozer, NP   200 mg at 07/28/12 2133  . ezetimibe (ZETIA) tablet 10 mg  10 mg Oral Daily Nada Boozer, NP   10 mg at 07/28/12 1012  . feeding supplement (ENSURE COMPLETE) liquid 237 mL  237 mL Oral BID BM Chrystie Nose, MD   237 mL at 07/28/12 1303  . furosemide (LASIX) tablet 80 mg  80 mg Oral BID Marykay Lex, MD   80 mg at 07/29/12 0836  . heparin ADULT infusion 100 units/mL (25000 units/250 mL)  2,450 Units/hr Intravenous Continuous Chrystie Nose, MD 24.5 mL/hr at 07/29/12 0656 2,450 Units/hr at 07/29/12 0656  . HYDROcodone-acetaminophen (NORCO/VICODIN) 5-325 MG per tablet 1 tablet  1 tablet Oral Q8H PRN Nada Boozer, NP   1 tablet at 07/29/12 0703  . loratadine (CLARITIN) tablet 10 mg  10 mg Oral Daily Nada Boozer, NP   10 mg at 07/28/12 1013  . metoprolol succinate (TOPROL-XL) 24 hr tablet 25 mg  25 mg Oral Daily Nada Boozer, NP   25 mg at 07/28/12 1015  . mupirocin ointment (BACTROBAN) 2 % 1 application  1 application Nasal BID Nada Boozer, NP  1 application at 07/28/12 2133  . nitroGLYCERIN (NITROSTAT) SL tablet 0.4 mg  0.4 mg Sublingual Q5 Min x 3 PRN Nada Boozer, NP      . ondansetron [facility]) injection 4 mg  4 mg Intravenous Q6H PRN Nada Boozer, NP      . pantoprazole (PROTONIX) EC tablet 40 mg  40 mg Oral Q1200 Nada Boozer, NP   40 mg at 07/28/12 1303  . polyethylene glycol (MIRALAX / GLYCOLAX) packet 17 g  17 g Oral BID Nada Boozer, NP   17 g at 07/28/12 2133  . senna (SENOKOT) tablet 8.6 mg  1 tablet Oral Daily Nada Boozer, NP   8.6 mg at 07/28/12 1016  . warfarin (COUMADIN) tablet 5 mg  5 mg Oral ONCE-1800 Chrystie Nose, MD   5 mg at 07/28/12 1753  . warfarin (COUMADIN) tablet 5 mg  5 mg Oral ONCE-1800 Chrystie Nose, MD      . Warfarin - Pharmacist Dosing Inpatient   Does not apply q1800 Chinita Greenland, PHARMD      . zolpidem [facility]) tablet 5 mg  5 mg Oral QHS Eda Paschal [place],  [place]   5 mg at 07/28/12 2133  . DISCONTD: heparin ADULT infusion 100 units/mL (25000 units/250 mL)  2,200 Units/hr Intravenous Continuous Colleen Can, PHARMD 22 mL/hr at 07/28/12 0908 2,200 Units/hr at 07/28/12 0908    PE: General appearance: alert, cooperative and no distress Lungs: clear to auscultation bilaterally Heart: irregularly irregular rhythm and 1/6 sys MM Abdomen: +BS, Nontender Extremities: Chronic Edema Pulses: 2+ and symmetric Neurologic: Grossly normal  Lab Results:   Basename 07/29/12 0820 07/29/12 0140 07/28/12 1620 07/28/12 0533  WBC 17.9* 17.1* -- 16.1*  HGB 8.4* 8.3* 8.9* --  HCT 26.7* 25.8* 27.4* --  PLT 558* 538* -- 529*   BMET  Basename 07/29/12 0820 07/29/12 0140 07/28/12 0533  NA 131* 135 134*  K 4.8 5.0 4.8  CL 92* 94* 94*  CO2 30 31 30   GLUCOSE 109* 89 78  BUN 43* 46* 44*  CREATININE 2.54* 2.63* 2.43*  CALCIUM 9.0 8.8 8.8   PT/INR  Basename 07/29/12 0820 07/29/12 0140 07/28/12 0533  LABPROT 18.1* 17.9* 16.6*  INR 1.55* 1.52* 1.38   Assessment/Plan   Principal Problem:  *Sustained SVT, with need for emergent DCCV 07/24/12 Active Problems:  Pulmonary embolism, Oct 2012, suspected recurrence 07/24/12  CKD (chronic kidney disease) stage 4, GFR 15-29 ml/min  Hypertension  GERD (gastroesophageal reflux disease)  Acute on chronic diastolic congestive heart failure -- secondary to rapid SVT, aFib RVR  Lymphedema  Type II or unspecified type diabetes mellitus without mention of complication, "Diet" controlled  Presence of IVC filter  Aortic stenosis, moderate; Mean gradient of by cath  H/O: GI bleed, severe (re-challenge with Coumadin this admission secondary to recuurent PE with IVCF)  CAD (coronary artery disease) - Mild-moderate with ~30% RCA lesion by cath  SOB (shortness of breath)  Hypotensive episode, secondary to SVT with HR 200  Respiratory failure, acute, secondary to tachycardia, diastolic HF  Chronic diastolic heart  failure  Anemia  Plan:  Hgb stable.   BP and HR controlled and stable.   Maintaining NSR with PACs in the 70's.   Amiodarone 400mg  PO bid currently.  PT following.  Pt lives her son who works during the day.  INR subtherapeutic .  DC when > 2.0.    LOS: 5 days    Elise Knobloch 07/29/2012 10:19 AM

## 2012-07-30 LAB — CBC
Hemoglobin: 8 g/dL — ABNORMAL LOW (ref 12.0–15.0)
RBC: 2.67 MIL/uL — ABNORMAL LOW (ref 3.87–5.11)

## 2012-07-30 LAB — IRON AND TIBC
Saturation Ratios: 32 % (ref 20–55)
TIBC: 158 ug/dL — ABNORMAL LOW (ref 250–470)
UIBC: 108 ug/dL — ABNORMAL LOW (ref 125–400)

## 2012-07-30 LAB — PRO B NATRIURETIC PEPTIDE: Pro B Natriuretic peptide (BNP): 2186 pg/mL — ABNORMAL HIGH (ref 0–450)

## 2012-07-30 LAB — BASIC METABOLIC PANEL
CO2: 32 mEq/L (ref 19–32)
GFR calc non Af Amer: 17 mL/min — ABNORMAL LOW (ref >90)
Glucose, Bld: 81 mg/dL (ref 70–99)
Potassium: 5.1 mEq/L (ref 3.5–5.1)
Sodium: 133 mEq/L — ABNORMAL LOW (ref 135–145)

## 2012-07-30 LAB — PREPARE RBC (CROSSMATCH)

## 2012-07-30 LAB — PROTIME-INR
INR: 1.71 — ABNORMAL HIGH (ref 0.00–1.49)
Prothrombin Time: 19.5 seconds — ABNORMAL HIGH (ref 11.6–15.2)

## 2012-07-30 LAB — OCCULT BLOOD X 1 CARD TO LAB, STOOL: Fecal Occult Bld: NEGATIVE

## 2012-07-30 LAB — RETICULOCYTES: Retic Ct Pct: 2.1 % (ref 0.4–3.1)

## 2012-07-30 MED ORDER — AMIODARONE LOAD VIA INFUSION: 150.0000 mg | Freq: Once | INTRAVENOUS | Status: DC

## 2012-07-30 MED ORDER — WARFARIN SODIUM 6 MG PO TABS
6.0000 mg | ORAL_TABLET | Freq: Once | ORAL | Status: AC
  Administered 2012-07-30: 6 mg via ORAL
  Filled 2012-07-30: qty 1

## 2012-07-30 MED ORDER — AMIODARONE HCL 200 MG PO TABS
400.0000 mg | ORAL_TABLET | Freq: Two times a day (BID) | ORAL | Status: DC
  Administered 2012-07-30 – 2012-08-12 (×26): 400 mg via ORAL
  Filled 2012-07-30 (×29): qty 2

## 2012-07-30 MED ORDER — FUROSEMIDE 10 MG/ML IJ SOLN
40.0000 mg | Freq: Once | INTRAMUSCULAR | Status: AC
  Administered 2012-07-31: 40 mg via INTRAVENOUS
  Filled 2012-07-30: qty 4

## 2012-07-30 MED ORDER — AMIODARONE IV BOLUS ONLY 150 MG/100ML
150.0000 mg | Freq: Once | INTRAVENOUS | Status: AC
  Administered 2012-07-30: 150 mg via INTRAVENOUS
  Filled 2012-07-30 (×3): qty 100

## 2012-07-30 NOTE — Progress Notes (Signed)
ANTICOAGULATION CONSULT NOTE - Follow Up Consult  Pharmacy Consult for heparin / Coumadin Indication: atrial fibrillation / ? PE  Labs:  Basename 07/30/12 0605 07/29/12 0820 07/29/12 0140 07/28/12 1620  HGB 8.0* 8.4* -- --  HCT 24.9* 26.7* 25.8* --  PLT 519* 558* 538* --  APTT -- -- -- --  LABPROT 19.5* 18.1* 17.9* --  INR 1.71* 1.55* 1.52* --  HEPARINUNFRC 0.65 -- 0.50 0.17*  CREATININE 2.57* 2.54* 2.63* --  CKTOTAL -- -- -- --  CKMB -- -- -- --  TROPONINI -- -- -- --    Assessment/Plan: 78yo female now therapeutic on heparin.  INR trending up on  5 mg doses.  Awaiting INR > 2 for discharge  Continue heparin at 2450 units / hr  Coumadin 6 mg po x 1 dose today (78 year old female) Continue to watch CBC  Thank you. Okey Regal, PharmD [phone]

## 2012-07-30 NOTE — Progress Notes (Signed)
Pt. Seen and examined. Agree with the NP/PA-C note as written. She still gets tachycardic with little movement - short fun of p-afib today. Bolused with amiodarone. Will stop heparin. Agree with transfusion. Restart po amiodarone. Continue po lasix.   Chrystie Nose, MD, [facility] Attending Cardiologist [facility]

## 2012-07-30 NOTE — Evaluation (Signed)
Physical Therapy Evaluation Patient Details Name: Christine [name] MRN: [id number] DOB: September 29, 1931 Today's Date: 07/30/2012 Time: 4010-2725 PT Time Calculation (min): 37 min  PT Assessment / Plan / Recommendation Clinical Impression  pt presents with Sustained SVT.  pt has history of decreased mobility and has good support and all needed DME at home, but would benefit from HHPT.  After ambulating RN and Rapid Response RN entered room stating pt was in v-fib.  pt had been walking and talking with only complaints feeling SOB and mild dizziness, which pt noted resolved after sitting down.  Per monitor tech pt was tachy up to 165 during ambulation.      PT Assessment  Patient needs continued PT services    Follow Up Recommendations  Home health PT;Supervision/Assistance - 24 hour    Does the patient have the potential to tolerate intense rehabilitation      Barriers to Discharge None      Equipment Recommendations  None recommended by PT    Recommendations for Other Services OT consult   Frequency Min 3X/week    Precautions / Restrictions Precautions Precautions: Fall Restrictions Weight Bearing Restrictions: No   Pertinent Vitals/Pain Denies pain.  HR up to 165 per monitor tech.        Mobility  Bed Mobility Bed Mobility: Supine to Sit;Sitting - Scoot to Edge of Bed Supine to Sit: 4: Min assist;With rails Sitting - Scoot to [place] of Bed: 3: Mod assist Details for Bed Mobility Assistance: Used pad to A with hips Transfers Transfers: Sit to Stand;Stand to Sit Sit to Stand: 4: Min assist;With upper extremity assist;From bed Stand to Sit: 4: Min assist;With upper extremity assist;To chair/3-in-1;With armrests Details for Transfer Assistance: cues to get closer to recliner prior to sitting.   Ambulation/Gait Ambulation/Gait Assistance: 4: Min guard Ambulation Distance (Feet): 35 Feet Assistive device: Rolling walker Ambulation/Gait Assistance Details: cues to stay closer to  RW, upright posture.   Gait Pattern: Step-through pattern;Decreased stride length;Trunk flexed Stairs: No Wheelchair Mobility Wheelchair Mobility: No    Shoulder Instructions     Exercises     PT Diagnosis: Difficulty walking;Generalized weakness  PT Problem List: Decreased strength;Decreased activity tolerance;Decreased balance;Decreased mobility;Decreased knowledge of use of DME;Cardiopulmonary status limiting activity PT Treatment Interventions: DME instruction;Gait training;Functional mobility training;Therapeutic activities;Therapeutic exercise;Balance training;Patient/family education   PT Goals Acute Rehab PT Goals PT Goal Formulation: With patient Time For Goal Achievement: 08/13/12 Potential to Achieve Goals: Good Pt will go Supine/Side to Sit: with modified independence PT Goal: Supine/Side to Sit - Progress: Goal set today Pt will go Sit to Supine/Side: with modified independence PT Goal: Sit to Supine/Side - Progress: Goal set today Pt will go Sit to Stand: with supervision PT Goal: Sit to Stand - Progress: Goal set today Pt will go Stand to Sit: with supervision PT Goal: Stand to Sit - Progress: Goal set today Pt will Ambulate: 51 - 150 feet;with supervision;with rolling walker PT Goal: Ambulate - Progress: Goal set today  Visit Information  Last PT Received On: 07/30/12 Assistance Needed: +1    Subjective Data  Subjective: I want out of this bed.   Patient Stated Goal: Home   Prior Functioning  Home Living Lives With: Son Available Help at Discharge: Family;Personal care attendant;Available 24 hours/day Type of Home: House Home Access: Level entry Home Layout: One level Bathroom Shower/Tub: Tub/shower unit;Curtain Firefighter: Standard Bathroom Accessibility: Yes How Accessible: Accessible via walker Home Adaptive Equipment: Bedside commode/3-in-1;Hand-held shower hose;Hospital bed;Raised toilet seat with  rails;Tub transfer bench;Walker -  rolling;Wheelchair - manual Prior Function Level of Independence: Needs assistance Needs Assistance: Bathing;Dressing;Meal Prep;Light Housekeeping;Gait Bath: Maximal Dressing: Maximal Meal Prep: Total Light Housekeeping: Total Gait Assistance: Uses RW Able to Take Stairs?: No Driving: No Vocation: Retired Comments: pt has family and CG's who provide 24 hr care.   Communication Communication: No difficulties    Cognition  Overall Cognitive Status: Appears within functional limits for tasks assessed/performed Arousal/Alertness: Awake/alert Orientation Level: Appears intact for tasks assessed Behavior During Session: [facility] for tasks performed    Extremity/Trunk Assessment Right Lower Extremity Assessment RLE ROM/Strength/Tone: Deficits RLE ROM/Strength/Tone Deficits: Grossly weak, 4-/5 RLE Sensation: WFL - Light Touch Left Lower Extremity Assessment LLE ROM/Strength/Tone: Deficits LLE ROM/Strength/Tone Deficits: Generally weak, Grossly 4-/5 LLE Sensation: WFL - Light Touch Trunk Assessment Trunk Assessment: Kyphotic   Balance Balance Balance Assessed: Yes Static Standing Balance Static Standing - Balance Support: Bilateral upper extremity supported Static Standing - Level of Assistance: 5: Stand by assistance  End of Session PT - End of Session Equipment Utilized During Treatment: Gait belt;Oxygen Activity Tolerance: Patient limited by fatigue Patient left: in chair;with call bell/phone within reach;with family/visitor present;with nursing in room Nurse Communication: Mobility status  GP     Christine [name], [place] [phone] 07/30/2012, 12:17 PM

## 2012-07-30 NOTE — Progress Notes (Signed)
Pt experienced increase in her HR during PT 140"s-160's and a questionable run of V-tach, She felt a little bit light headed. Right now pt is stable pleasant sitting in the chair HR 108 BP 125/53 sat 95 % on RA. No complaint. Rapid response was alerted. Pt is A-fib on the monitor HR 110-130"s. Will continue to monitor.

## 2012-07-30 NOTE — Progress Notes (Signed)
Pt up to chair and developed rapid a. Fib  Hgb is 8.0. Pt without specific complaints.  Will place back in bed and transfuse 1 unit PRBCs.   Had been on amiodarone for 5 days now not on amiodarone.  Will give 150 mg bolus now.

## 2012-07-30 NOTE — Care Management Note (Addendum)
    Page 1 of 2   08/01/2012     9:32:49 AM   CARE MANAGEMENT NOTE 08/01/2012  Patient:  Christine [name], Christine [name]   Account Number:  [id number]  Date Initiated:  07/24/2012  Documentation initiated by:  CMA,WORKLIST  Subjective/Objective Assessment:   adm w arrthymia     Action/Plan:   lives w da, pcp dr Oliver Barre   Anticipated DC Date:  08/02/2012   Anticipated DC Plan:  HOME W HOME HEALTH SERVICES      DC Planning Services  CM consult      [facility] Choice  HOME HEALTH   Choice offered to / List presented to:  C-4 Adult Children        HH arranged  HH-1 RN  HH-10 DISEASE MANAGEMENT  HH-2 PT      HH agency  [facility]   Status of service:  Completed, signed off Medicare Important Message given?   (If response is "NO", the following Medicare IM given date fields will be blank) Date Medicare IM given:   Date Additional Medicare IM given:    Discharge Disposition:  HOME W HOME HEALTH SERVICES  Per UR Regulation:  Reviewed for med. necessity/level of care/duration of stay  If discussed at Long Length of Stay Meetings, dates discussed:   07/31/2012    Comments:  08-01-12 0920 Tomi Bamberger, RN,BSN [phone] CM made referral for above services listed. SOC to begin within 24-48 hours post d/c. No further needs from CM.    07-30-12  1539 Tomi Bamberger, [place] [phone] CM was able to speak to pt and family members. She lives with her son and she has a friend of the family that can provide care while the son is at work.  Pt stated that she is a part of [facility]. CM did place a call to [facility] hospital liaison with [facility] to make them aware pt is here. CM thinks pt would benefit from [facility] for CHF management and tele health monitoring for weight monitoring. PT recommends [facility] PT services also. Looks like pt was previously active with [facility] for services. Cm will make referral for services once MD writes orders.   07-28-12 1534 Tomi Bamberger,  [place] [phone] Pt has iv heparin gtt infusing for afib.   10/10 13:38 debbie dowell rn,bsn [phone]

## 2012-07-30 NOTE — Progress Notes (Signed)
I agree with 1PBRC transfusion (watch to see if IV lasix needed - but has been diuresisng well on PO. Somehow Amiodarone not ordered -- agree with reload & back to PO load with recent burst of Afib RVR -- happened while moving about & in setting of anemia.  No overt CHF. Consider GI evaluation (+/- OB eval) for recurrent anemia on anticoagulation -- restarted Warfarin due to ? Recurrent PE & large Afib burden.  Will d/c IV Heparin "bridge as INR is slowly increasing.  Marykay Lex, M.D., M.S. [facility] [address] [place], [place]  [address]  [phone] Pager # [phone] 07/30/2012 4:06 PM '

## 2012-07-30 NOTE — Progress Notes (Signed)
Subjective:  No complaints of SOB   Objective:  Vital Signs in the last 24 hours: Temp:  [97.7 F (36.5 C)-98.6 F (37 C)] 97.7 F (36.5 C) (10/16 0500) Pulse Rate:  [58-89] 73  (10/16 0500) Resp:  [18-20] 18  (10/16 0500) BP: (97-124)/(40-72) 124/55 mmHg (10/16 0500) SpO2:  [95 %-100 %] 100 % (10/16 0500) Weight:  [109.3 kg (240 lb 15.4 oz)] 109.3 kg (240 lb 15.4 oz) (10/16 0500)  Intake/Output from previous day:  Intake/Output Summary (Last 24 hours) at 07/30/12 1016 Last data filed at 07/30/12 0500  Gross per 24 hour  Intake    477 ml  Output   2450 ml  Net  -1973 ml    Physical Exam: General appearance: alert, cooperative, no distress and morbidly obese Lungs: clear to auscultation bilaterally Heart: regular rate and rhythm and frequent PACs Rectal= brown stool, hemacult pending   Rate: 80  Rhythm: normal sinus rhythm and frequent PACs  Lab Results:  Basename 07/30/12 0605 07/29/12 0820  WBC 16.6* 17.9*  HGB 8.0* 8.4*  PLT 519* 558*    Basename 07/30/12 0605 07/29/12 0820  NA 133* 131*  K 5.1 4.8  CL 93* 92*  CO2 32 30  GLUCOSE 81 109*  BUN 45* 43*  CREATININE 2.57* 2.54*   No results found for this basename: TROPONINI:2,CK,MB:2 in the last 72 hours Hepatic Function Panel No results found for this basename: PROT,ALBUMIN,AST,ALT,ALKPHOS,BILITOT,BILIDIR,IBILI in the last 72 hours No results found for this basename: CHOL in the last 72 hours  Basename 07/30/12 0605  INR 1.71*    Imaging: Imaging results have been reviewed  Cardiac Studies:  Assessment/Plan:   Principal Problem:  *Sustained SVT, with need for emergent DCCV 07/24/12 Active Problems:  Acute on chronic diastolic congestive heart failure -- secondary to rapid SVT, aFib RVR  Respiratory failure, acute, secondary to tachycardia, diastolic HF  Pulmonary embolism, Oct 2012, suspected recurrence 07/24/12  CKD (chronic kidney disease) stage 4, GFR 15-29 ml/min  Presence of IVC filter  PAF, recurrent 07/24/12- Amiodarone added- NSR  H/O: GI bleed, severe (re-challenge with Coumadin this admission secondary to recuurent PE with IVCF)  CAD (coronary artery disease) - Mild-moderate with ~30% RCA lesion by cath  SOB (shortness of breath)  Hypotensive episode, secondary to SVT with HR 200  Chronic diastolic heart failure  Anemia  Hypertension  GERD (gastroesophageal reflux disease)  Lymphedema  Type II or unspecified type diabetes mellitus without mention of complication, "Diet" controlled  Aortic stenosis, moderate; Mean gradient of by cath  Plan- She is diuresing, BNP 2100. She remains anemic, Hgb down to 8.0. Stool hemoccult pending. Consider transfusion. She is on Heparin to Coumadin for suspected recurrent PE and PAF which precipitated SVT and respiratory failure on admission. She is holding NSR with PACs on Amiodarone. She has had prior GI bleeding and has an IVC filter in place. She is morbidly obese and really doesn;t get out of bed. She was living at home prior to admission with her son, getting HHPT. Will discuss with Dr Rennis Golden.    Corine Shelter PA-C 07/30/2012, 10:16 AM

## 2012-07-30 NOTE — Progress Notes (Signed)
Patient is currently active with [facility] Care Management for chronic disease management services.  Patient has been engaged by a [employer].  Patient will receive a post discharge transition of care call and will be evaluated for monthly home visits for assessments and disease process education.  Made inpatient Case Manager, Mable Fill, aware that [facility] Care Management following. Of note, [facility] Care Management services does not replace or interfere with any services that are arranged by inpatient case management or social work.  For additional questions or referrals please contact Anibal Henderson BSN RN [facility] Liaison at [phone].

## 2012-07-31 DIAGNOSIS — D649 Anemia, unspecified: Secondary | ICD-10-CM

## 2012-07-31 LAB — CBC
HCT: 28.7 % — ABNORMAL LOW (ref 36.0–46.0)
Hemoglobin: 9.1 g/dL — ABNORMAL LOW (ref 12.0–15.0)
MCH: 29.8 pg (ref 26.0–34.0)
MCV: 94.1 fL (ref 78.0–100.0)
RBC: 3.05 MIL/uL — ABNORMAL LOW (ref 3.87–5.11)

## 2012-07-31 LAB — FERRITIN: Ferritin: 2301 ng/mL — ABNORMAL HIGH (ref 10–291)

## 2012-07-31 LAB — BASIC METABOLIC PANEL
CO2: 30 mEq/L (ref 19–32)
Chloride: 92 mEq/L — ABNORMAL LOW (ref 96–112)
Creatinine, Ser: 2.61 mg/dL — ABNORMAL HIGH (ref 0.50–1.10)
Glucose, Bld: 75 mg/dL (ref 70–99)
Sodium: 134 mEq/L — ABNORMAL LOW (ref 135–145)

## 2012-07-31 LAB — PRO B NATRIURETIC PEPTIDE: Pro B Natriuretic peptide (BNP): 1766 pg/mL — ABNORMAL HIGH (ref 0–450)

## 2012-07-31 MED ORDER — WARFARIN SODIUM 5 MG PO TABS
5.0000 mg | ORAL_TABLET | Freq: Every day | ORAL | Status: DC
  Administered 2012-07-31 – 2012-08-01 (×2): 5 mg via ORAL
  Filled 2012-07-31 (×4): qty 1

## 2012-07-31 NOTE — Progress Notes (Addendum)
Subjective: No further afib with resuming amiodarone with bolus and po.   Also rec'd one unit of blood. Stool heme negative Objective: Vital signs in last 24 hours: Temp:  [97.2 F (36.2 C)-99.4 F (37.4 C)] 99.4 F (37.4 C) (10/17 0500) Pulse Rate:  [75-85] 75  (10/17 0500) Resp:  [16-18] 16  (10/17 0100) BP: (93-119)/(44-78) 119/45 mmHg (10/17 0500) SpO2:  [100 %] 100 % (10/17 0500) Weight:  [112.038 kg (247 lb)] 112.038 kg (247 lb) (10/17 0500) Weight change: 2.739 kg (6 lb 0.6 oz) Last BM Date: 07/28/12 Intake/Output from previous day: -2337.5 10/16 0701 - 10/17 0700 In: 12.5 [Blood:12.5] Out: 2350 [Urine:2350] Intake/Output this shift:   PE: General:alert and oriented,pleasant affect, complains of gout in hands Neck:no JVD Heart:S1S2 RRR Lungs:clear ant. ZOX:WRUEA, soft, non tender Ext:tr to 1+ edema, but very good for pt. Tele: no further tachy, appeared to be a fib since yesterday with rates 130-160   Lab Results:  Basename 07/31/12 0615 07/30/12 0605  WBC 15.3* 16.6*  HGB 9.1* 8.0*  HCT 28.7* 24.9*  PLT 522* 519*  Appropriate increase in Hgb after 1 unit blood.   BMET  Basename 07/31/12 0615 07/30/12 0605  NA 134* 133*  K 5.5* 5.1  CL 92* 93*  CO2 30 32  GLUCOSE 75 81  BUN 45* 45*  CREATININE 2.61* 2.57*  CALCIUM 9.2 9.2  Renal Fxn Stable   EKG: no new ECG Studies/Results: No recent studies No results found.  Medications: I have reviewed the patient's current medications.    [name] amiodarone  150 mg Intravenous Once  . amiodarone  400 mg Oral BID  . aspirin EC  81 mg Oral Daily  . cholecalciferol  2,000 Units Oral Daily  . diltiazem  120 mg Oral Daily  . docusate sodium  200 mg Oral BID  . ezetimibe  10 mg Oral Daily  . feeding supplement  237 mL Oral BID BM  . furosemide  40 mg Intravenous Once  . furosemide  80 mg Oral BID  . loratadine  10 mg Oral Daily  . metoprolol succinate  25 mg Oral Daily  . pantoprazole  40 mg Oral Q1200  .  polyethylene glycol  17 g Oral BID  . senna  1 tablet Oral Daily  . warfarin  5 mg Oral q1800  . warfarin  6 mg Oral ONCE-1800  . Warfarin - Pharmacist Dosing Inpatient   Does not apply q1800  . zolpidem  5 mg Oral QHS  . DISCONTD: amiodarone  150 mg Intravenous Once   Assessment/Plan: Principal Problem:  *Sustained SVT, with need for emergent DCCV 07/24/12 Active Problems:  PAF, recurrent 07/24/12- Amiodarone added- NSR  CKD (chronic kidney disease) stage 4, GFR 15-29 ml/min  Aortic stenosis, moderate; Mean gradient of by cath  H/O: GI bleed, severe (re-challenge with Coumadin this admission secondary to recuurent PE with IVCF)  Pulmonary embolism, Oct 2012, suspected recurrence 07/24/12  Hypertension  GERD (gastroesophageal reflux disease)  Morbid obesity  Acute on chronic diastolic congestive heart failure -- secondary to rapid SVT, aFib RVR  Lymphedema  Type II or unspecified type diabetes mellitus without mention of complication, "Diet" controlled  Presence of IVC filter  CAD (coronary artery disease) - Mild-moderate with ~30% RCA lesion by cath  SOB (shortness of breath)  Hypotensive episode, secondary to SVT with HR 200  Respiratory failure, acute, secondary to tachycardia, diastolic HF  Chronic diastolic heart failure grade 1, NL LVF echo Aug 2013  Anemia  Multifactorial -- h/o GI Bleed & Vaginal bleeding but no overt bleed with acute steadily dropping H/H.   Did have appropriate increase in Hgb.  Have d/c'd heparin bridge & INR now therapeutic  PLAN:  ? Transfuse 2nd unit of blood. Was to see MD at [facility]? Consult?  Amiodarone continues at 400 BID.    LOS: 7 days   Christine [name],Christine [name] 07/31/2012, 10:24 AM  I have seen & examined the patient along with Nada Boozer, NP.  I agree with her findings, exam & impression noted above.   She continues to diurese with PO lasix - no overt worsening of CHF with transfusion.  At this point, hold off on further  transfusion -- monitor H/H -  if Hgb stable over next 1-2 days, can probably have OP GI eval, but will consult GI of her condition & defer to their judgement.  With INR stable - no further need of Heparin.  Back on amiodarone -- will need to monitor for recurrence of Afib -- she does not tolerate Afib RVR with her underlying diastolic dusfunction & Pulm HTN.  Renal Fxn stable despite anemia & diuresis.  Provided Hgb remains stable, will need to see how well she does with ambulation prior to d/c.   PT & CM recommend HHPT.RN for CHF management assistance & rehab ==> agree with proceeding with this plan.  Needs to be OOB-chair & ambulate.  Also monitor SaO2 off of O2.  Marykay Lex, M.D., M.S. [facility] [address] [place], [place]  [address]  [phone] Pager # [phone] 07/31/2012 11:20 AM

## 2012-07-31 NOTE — Consult Note (Signed)
[facility] Consult: 1:48 PM 07/31/2012   Referring Provider: Nicki Guadalajara MD Primary Care Physician:  Oliver Barre, MD Primary Gastroenterologist:   Seen by Dr Leone Payor as recently as 04/2012  Reason for Consultation:  Anemia and FOB negative stool  HPI: Christine [name] is a 78 y.o. female.  Chronically ill, AAF.  Chronic narcotics for back pain. Hx of uterine cancer with TAH/SPO in 2009. Has chronic kidney disease.   Seen re: constipation by Dr Leone Payor 04/23/12.  Carafate discontinued, Miralax added.  hx of PE 07/2011 when she started chronic coumadin.  This was discontinued because of hematuria and supra therapeutic INR in Jan 2013. She required blood transfusion in Jan 2013, 2 units.  Also has hx of vaginal bleeding associated with radiation treatment. .  S/p IVC filter placement.   Currently admitted x 7 days due to acute SOB and chest pain in setting of SVT. This is controlled with Amiodarone currently.  She was already started on coumadin again 6 days ago.  As she is anemic, they want GI EVal before starting this.   She was transfused with one unit of blood 10/16 when Hgb dropped to 7.6 from 8.5 in 24 hours.  It was 10.5 on admission.  MCV is normal.   Heparin drip was discontinued 10/16. Last Coumadin was taken on 10/14 Hgb in 04/2012 was 9.1 with normal MCV.   Pt denies having received epogen or parenteral iron in past.   Pt has no BPR, no melenic stools, constipation is effectively managed with Colace, Miralx and Sennekot. No nausea, rare heartburn, no dysphagia.  Weight has dropped since admission but she denies great weight loss or gain PTA.  Appetite is good  Her Lasix dose was increased last week to 160 mg BID by Dr Darrick Penna.   Previous colonoscopies and EGDs were with Dr Virginia Rochester, see synopsis below.      Past Medical History  Diagnosis Date  . GERD (gastroesophageal reflux disease)   . Hypertension   . Morbid obesity   .  Arthritis   . Chronic kidney disease     nephrolithiasis left kidney,s/p open resection  . Status post arthroscopic surgery of right knee   . Chronic back pain   . Pneumonia 07/2011    hx  . Pulmonary embolism 07/2011    hx  . Uterine cancer     endometrial high grade adenocarcinoma  . Uterus cancer, sarcoma     high grade invasive carcinosarcoma  . Myocardial infarction 1989  . Bronchitis   . H/O cardiomegaly   . Lymphedema 09/29/2011  . Diabetes mellitus, diet controlled 10/03/2011  . Chronic kidney disease, stage II (mild) 09/20/2011  . Acute diastolic congestive heart failure, 2D Oct 2012 09/29/2011  . Presence of IVC filter 01/08/2012  . History of colon polyps 01/08/2012  . Chest pain 01/16/2012  . Aortic stenosis, moderate 01/16/2012  . PAF (paroxysmal atrial fibrillation), 01/18/12 01/18/2012  . CAD (coronary artery disease) 03/14/2012  . Nephrolithiasis 03/14/2012  . Diverticulosis 03/14/2012  . Allergic rhinitis, cause unspecified 05/04/2012  . Gout 06/25/2012  . Radiation 02/03/2009    4500 cGy 25 fx iridium-192/uterine  . Sustained SVT, with need for emergent DCCV 07/24/12 07/24/2012  . SOB (shortness of breath) 07/24/2012  . Hypotensive episode, secondary to SVT with HR 200 07/24/2012  . Respiratory failure, acute, secondary to tachycardia, diastolic HF 07/24/2012    Past Surgical History  Procedure Date  . Colonoscopy     multiple  . Knee  arthroscopy     right knee  . Cataract extraction, bilateral   . Tubal ligation 1973  . Abdominal hysterectomy 10/05/2008    total with b/l salpingo-oopherectomy  . Kidney stone removal   . Open left knee cartilage repair   . Left index finger surgery   . Cardiac catheterization     Prior to Admission medications   Medication Sig Start Date End Date Taking? Authorizing Provider  acetaminophen (TYLENOL) 500 MG tablet Take 500 mg by mouth every 6 (six) hours as needed. For pain   Yes Historical Provider, MD  albuterol (PROVENTIL  HFA;VENTOLIN HFA) 108 (90 BASE) MCG/ACT inhaler Inhale 2 puffs into the lungs every 6 (six) hours as needed. For shortness of breath   Yes Historical Provider, MD  allopurinol (ZYLOPRIM) 100 MG tablet Take 100 mg by mouth daily.   Yes Historical Provider, MD  aspirin EC 81 MG tablet Take 81 mg by mouth daily.   Yes Historical Provider, MD  cholecalciferol (VITAMIN D) 1000 UNITS tablet Take 2,000 Units by mouth daily.    Yes Historical Provider, MD  docusate sodium (COLACE) 100 MG capsule Take 200 mg by mouth 2 (two) times daily. For stool softner   Yes Historical Provider, MD  dronedarone (MULTAQ) 400 MG tablet Take 400 mg by mouth 2 (two) times daily with a meal.   Yes Historical Provider, MD  ezetimibe (ZETIA) 10 MG tablet Take 10 mg by mouth daily.    Yes Historical Provider, MD  fexofenadine (ALLEGRA) 60 MG tablet Take 60 mg by mouth daily.   Yes Historical Provider, MD  furosemide (LASIX) 80 MG tablet Take 80 mg by mouth 2 (two) times daily.   Yes Historical Provider, MD  metoprolol succinate (TOPROL-XL) 25 MG 24 hr tablet Take 25 mg by mouth daily.   Yes Historical Provider, MD  nitroGLYCERIN (NITROSTAT) 0.4 MG SL tablet Place 0.4 mg under the tongue every 5 (five) minutes as needed. For chest pain. Do not exceed 3 tablets. If pain is not resolved after 3rd tablet call 911.   Yes Historical Provider, MD  Nutritional Supplements (ENSURE PO) Take 1 each by mouth 2 (two) times daily as needed. For supplement   Yes Historical Provider, MD  omeprazole (PRILOSEC) 20 MG capsule Take 20 mg by mouth daily.   Yes Historical Provider, MD  polyethylene glycol (MIRALAX / GLYCOLAX) packet Take 17 g by mouth 2 (two) times daily.    Yes Historical Provider, MD  senna (SENOKOT) 8.6 MG TABS Take 1 tablet by mouth.   Yes Historical Provider, MD  zolpidem (AMBIEN) 5 MG tablet Take 5-10 mg by mouth at bedtime as needed. For sleep.   Yes Historical Provider, MD    Scheduled Meds:    . amiodarone  150 mg  Intravenous Once  . amiodarone  400 mg Oral BID  . aspirin EC  81 mg Oral Daily  . cholecalciferol  2,000 Units Oral Daily  . diltiazem  120 mg Oral Daily  . docusate sodium  200 mg Oral BID  . ezetimibe  10 mg Oral Daily  . feeding supplement  237 mL Oral BID BM  . furosemide  40 mg Intravenous Once  . furosemide  80 mg Oral BID  . loratadine  10 mg Oral Daily  . metoprolol succinate  25 mg Oral Daily  . pantoprazole  40 mg Oral Q1200  . polyethylene glycol  17 g Oral BID  . senna  1 tablet Oral Daily  .  warfarin  5 mg Oral q1800  . warfarin  6 mg Oral ONCE-1800  . Warfarin - Pharmacist Dosing Inpatient   Does not apply q1800  . zolpidem  5 mg Oral QHS   Infusions:    . sodium chloride 10 mL/hr at 07/29/12 0655  . sodium chloride 10 mL/hr at 07/25/12 0843  . DISCONTD: heparin 2,450 Units/hr (07/30/12 0339)   PRN Meds: acetaminophen, albuterol, ALPRAZolam, alum & mag hydroxide-simeth, HYDROcodone-acetaminophen, nitroGLYCERIN, ondansetron (ZOFRAN) IV   Allergies as of 07/24/2012  . (No Known Allergies)    Family History  Problem Relation Age of Onset  . Breast cancer Sister     1st sister  . Colon cancer Sister     2nd sister  . Cervical cancer Sister     3rd sister    History   Social History  . Marital Status: Divorced    Spouse Name: N/A    Number of Children: N/A  . Years of Education: N/A   Occupational History  . Not on file.   Social History Main Topics  . Smoking status: Never Smoker   . Smokeless tobacco: Never Used  . Alcohol Use: No  . Drug Use: No  . Sexually Active: No   Other Topics Concern  . Not on file   Social History Narrative  . No narrative on file    REVIEW OF SYSTEMS: No nose bleeds, no large areas of bruising.  No recurrent vaginal bleeding No headaches, no seisures No rash or itching No hx of liver dz Sister died with colon cancer at age 21 Pt does not smoke or drink etoh Chronic LE lymphedema No vision problems.     PHYSICAL EXAM: Vital signs in last 24 hours: Temp:  [97.2 F (36.2 C)-99.4 F (37.4 C)] 99.4 F (37.4 C) (10/17 0500) Pulse Rate:  [75-85] 75  (10/17 0500) Resp:  [16-18] 16  (10/17 0100) BP: (106-119)/(44-78) 119/45 mmHg (10/17 0500) SpO2:  [100 %] 100 % (10/17 0500) Weight:  [247 lb (112.038 kg)] 247 lb (112.038 kg) (10/17 0500)  General: pleasant elderly AAF does not look ill Head:  No swellling in face  Eyes:  No icterus or pallor Ears:  Not HOH  Nose:  No discharge Mouth:  Ill fitting upper dentures , very few remaining lower teeth.  MM pink and moist. Tongue midline Neck:  No mass or JVD Lungs:  Clear B but reduced sounds.  No dyspnea Heart: Irreg irreg, rate in normal range.  Very soft SM Abdomen:  Soft, obes, NT, ND, healed midline scar.  No mass or HSM.  No bruits.   Rectal: not done.  FOB negative in lab   Musc/Skeltl: no contractures Extremities:  Marked non-pitting lymphedema in both legs  Neurologic:  Pleasant, no gross deficits, no tremor, oriented x 3.   Skin:  No rash or sores Tattoos:  none Nodes:  No cervical adenopathy   Psych:  Pleasant, not anxious or depressed.  Good historian.  LAB RESULTS:  Basename 07/31/12 0615 07/30/12 0605 07/29/12 0820  WBC 15.3* 16.6* 17.9*  HGB 9.1* 8.0* 8.4*  HCT 28.7* 24.9* 26.7*  PLT 522* 519* 558*   BMET Lab Results  Component Value Date   NA 134* 07/31/2012   NA 133* 07/30/2012   NA 131* 07/29/2012   K 5.5* 07/31/2012   K 5.1 07/30/2012   K 4.8 07/29/2012   CL 92* 07/31/2012   CL 93* 07/30/2012   CL 92* 07/29/2012   CO2 30  07/31/2012   CO2 32 07/30/2012   CO2 30 07/29/2012   GLUCOSE 75 07/31/2012   GLUCOSE 81 07/30/2012   GLUCOSE 109* 07/29/2012   BUN 45* 07/31/2012   BUN 45* 07/30/2012   BUN 43* 07/29/2012   CREATININE 2.61* 07/31/2012   CREATININE 2.57* 07/30/2012   CREATININE 2.54* 07/29/2012   CALCIUM 9.2 07/31/2012   CALCIUM 9.2 07/30/2012   CALCIUM 9.0 07/29/2012   LFT No results found  for this basename: PROT:3,ALBUMIN:3,AST:3,ALT:3,ALKPHOS:3,BILITOT:3,BILIDIR:3,IBILI:3 in the last 72 hours PT/INR Lab Results  Component Value Date   INR 2.08* 07/31/2012   INR 1.71* 07/30/2012   INR 1.55* 07/29/2012    Ref. Range 07/30/2012 13:05  Iron Latest Range: 42-135 ug/dL 50  UIBC Latest Range: 125-400 ug/dL 578 (L)  TIBC Latest Range: 250-470 ug/dL 469 (L)  Saturation Ratios Latest Range: 20-55 % 32  Ferritin Latest Range: 10-291 ng/mL 2301 (H)  Folate No range found 6.8     RADIOLOGY STUDIES: No results found.  ENDOSCOPIC STUDIES: Colonoscopy   Dr Virginia Rochester  2009 EGD   Dr Virginia Rochester  ? When Colonoscopy 2002.  No report but surg path reveals polyploid colonic mucosa with focal erosions.  No adenomatous or malignant changes.   Unable to pull up any reports in Epic   IMPRESSION: *  Chronic, normocytic anemia.  Iron studies c/w anemia of chronic disease.  She has stage 4 CKD I suspect this is a strong contributor.  *  SVT, rate now controlled.  Lingering Afib.  Cardiology wants to use Coumadin *  Hx PE.  Coumadin d/c'd early in 2013 because of urinary tract bleeding. *  Hx of uterine cancer, treated with radiation and TAH/SPO.  Hx of uterine bleeding.  *  Hx non-adenomatous colon polyps *  Narcotics induced constipation, well managed with current meds *  Large HH seen on CT scan abdomen in Jan 2013.    *  Diastolic CHF  PLAN: *  Colonoscopy/egd?  Will d/w Dr Christella Hartigan.    LOS: 7 days   Christine [name]  07/31/2012, 1:48 PM Pager: [phone]      ________________________________________________________________________  [facility] MD note:  I personally examined the patient, reviewed the data and agree with the assessment and plan described above.  She has INR 2.0 without overt GI bleeding and is hemocult negative.  She is not microcytic or iron deficient on recent labs.  Colonoscopy done 4 years ago does not need to be repeated now unless she has overt GI bleeding.  She has had  bleeding issues in the past on coumadin (urinary tract) and that will have to be watched for.     Rob Bunting, MD [facility] Pager [phone]

## 2012-07-31 NOTE — Progress Notes (Signed)
ANTICOAGULATION CONSULT NOTE - Follow Up Consult  Pharmacy Consult for Coumadin Indication: atrial fibrillation / ? PE  Labs:  Basename 07/31/12 0615 07/30/12 0605 07/29/12 0820 07/29/12 0140 07/28/12 1620  HGB 9.1* 8.0* -- -- --  HCT 28.7* 24.9* 26.7* -- --  PLT 522* 519* 558* -- --  APTT -- -- -- -- --  LABPROT 22.5* 19.5* 18.1* -- --  INR 2.08* 1.71* 1.55* -- --  HEPARINUNFRC -- 0.65 -- 0.50 0.17*  CREATININE 2.61* 2.57* 2.54* -- --  CKTOTAL -- -- -- -- --  CKMB -- -- -- -- --  TROPONINI -- -- -- -- --    Assessment/Plan: INR now therapeutic at 2.08 History of vaginal bleeding on Coumadin - CBC stable  1) Coumadin 5 mg po daily at 1800 2) Daily INR  Thank you. Okey Regal, PharmD [phone]

## 2012-08-01 LAB — BASIC METABOLIC PANEL
CO2: 25 mEq/L (ref 19–32)
Chloride: 91 mEq/L — ABNORMAL LOW (ref 96–112)
Sodium: 131 mEq/L — ABNORMAL LOW (ref 135–145)

## 2012-08-01 LAB — CBC
Hemoglobin: 10 g/dL — ABNORMAL LOW (ref 12.0–15.0)
Platelets: 544 10*3/uL — ABNORMAL HIGH (ref 150–400)
RBC: 3.28 MIL/uL — ABNORMAL LOW (ref 3.87–5.11)
WBC: 19.9 10*3/uL — ABNORMAL HIGH (ref 4.0–10.5)

## 2012-08-01 LAB — PROTIME-INR: Prothrombin Time: 22.8 seconds — ABNORMAL HIGH (ref 11.6–15.2)

## 2012-08-01 LAB — PRO B NATRIURETIC PEPTIDE: Pro B Natriuretic peptide (BNP): 1502 pg/mL — ABNORMAL HIGH (ref 0–450)

## 2012-08-01 MED ORDER — SODIUM POLYSTYRENE SULFONATE 15 GM/60ML PO SUSP
15.0000 g | Freq: Once | ORAL | Status: AC
  Administered 2012-08-01: 15 g via ORAL
  Filled 2012-08-01: qty 60

## 2012-08-01 NOTE — Progress Notes (Signed)
Physical Therapy Treatment Patient Details Name: Christine [name] MRN: [id number] DOB: 04-Mar-1931 Today's Date: 08/01/2012 Time: 1420-1450 PT Time Calculation (min): 30 min  PT Assessment / Plan / Recommendation Comments on Treatment Session  pt presents with Sustained SVT.  Today pt was able to improve ambulation distance and HR remained <120 throughout mobility.  pt notes feeling good after ambulation.      Follow Up Recommendations  Home health PT;Supervision/Assistance - 24 hour     Does the patient have the potential to tolerate intense rehabilitation     Barriers to Discharge        Equipment Recommendations  None recommended by PT    Recommendations for Other Services OT consult  Frequency Min 3X/week   Plan Discharge plan remains appropriate;Frequency remains appropriate    Precautions / Restrictions Precautions Precautions: Fall Restrictions Weight Bearing Restrictions: No   Pertinent Vitals/Pain Denies pain.      Mobility  Bed Mobility Bed Mobility: Supine to Sit;Sitting - Scoot to [place] of Bed;Sit to Supine Supine to Sit: 4: Min assist;With rails Sitting - Scoot to Edge of Bed: 3: Mod assist Sit to Supine: 3: Mod assist Details for Bed Mobility Assistance: Used pad to A with hips Transfers Transfers: Sit to Stand;Stand to Sit Sit to Stand: 4: Min assist;With upper extremity assist;From bed Stand to Sit: 4: Min assist;With upper extremity assist;To bed Details for Transfer Assistance: cues for UE use.   Ambulation/Gait Ambulation/Gait Assistance: 4: Min guard Ambulation Distance (Feet): 60 Feet Assistive device: Rolling walker Ambulation/Gait Assistance Details: cues to stay closer to RW, upright posture, encouragement Gait Pattern: Step-through pattern;Decreased stride length;Trunk flexed Stairs: No Wheelchair Mobility Wheelchair Mobility: No    Exercises     PT Diagnosis:    PT Problem List:   PT Treatment Interventions:     PT Goals Acute Rehab  PT Goals Time For Goal Achievement: 08/13/12 PT Goal: Supine/Side to Sit - Progress: Progressing toward goal PT Goal: Sit to Supine/Side - Progress: Progressing toward goal PT Goal: Sit to Stand - Progress: Progressing toward goal PT Goal: Stand to Sit - Progress: Progressing toward goal PT Goal: Ambulate - Progress: Progressing toward goal  Visit Information  Last PT Received On: 08/01/12 Assistance Needed: +1    Subjective Data  Subjective: I'm ready to try.     Cognition  Overall Cognitive Status: Appears within functional limits for tasks assessed/performed Arousal/Alertness: Awake/alert Orientation Level: Appears intact for tasks assessed Behavior During Session: [facility] for tasks performed    Balance  Balance Balance Assessed: No  End of Session PT - End of Session Equipment Utilized During Treatment: Gait belt;Oxygen Activity Tolerance: Patient limited by fatigue Patient left: in bed;with call bell/phone within reach;with family/visitor present Nurse Communication: Mobility status   GP     Christine [name], Welcome [phone] 08/01/2012, 3:03 PM

## 2012-08-01 NOTE — Progress Notes (Signed)
[facility]  Subjective: Nauseated and dizzy this AM while OOB to chair.  Feeling better now.  Objective: Vital signs in last 24 hours: Temp:  [97.6 F (36.4 C)-98.4 F (36.9 C)] 98.3 F (36.8 C) (10/18 0500) Pulse Rate:  [78-85] 83  (10/18 0933) Resp:  [16-18] 16  (10/18 0500) BP: (106-131)/(48-63) 118/62 mmHg (10/18 0933) SpO2:  [96 %-100 %] 100 % (10/18 0500) Weight:  [109.907 kg (242 lb 4.8 oz)] 109.907 kg (242 lb 4.8 oz) (10/18 0500) Last BM Date: 07/31/12  Intake/Output from previous day: 10/17 0701 - 10/18 0700 In: 480 [P.O.:480] Out: 2100 [Urine:2100] Intake/Output this shift:    Medications Current Facility-Administered Medications  Medication Dose Route Frequency Provider Last Rate Last Dose  . 0.9 %  sodium chloride infusion   Intravenous Continuous Nada Boozer, NP 10 mL/hr at 07/29/12 0655    . 0.9 %  sodium chloride infusion   Intravenous Continuous Marykay Lex, MD 10 mL/hr at 07/25/12 [phone]    . acetaminophen (TYLENOL) tablet 650 mg  650 mg Oral Q4H PRN Nada Boozer, NP   650 mg at 07/30/12 0636  . albuterol (PROVENTIL HFA;VENTOLIN HFA) 108 (90 BASE) MCG/ACT inhaler 2 puff  2 puff Inhalation Q6H PRN Nada Boozer, NP   2 puff at 07/29/12 1844  . ALPRAZolam Prudy Feeler) tablet 0.25 mg  0.25 mg Oral Q8H PRN Abelino Derrick, PA   0.25 mg at 07/30/12 0645  . alum & mag hydroxide-simeth (MAALOX/MYLANTA) 200-200-20 MG/5ML suspension 30 mL  30 mL Oral Q4H PRN Nada Boozer, NP   30 mL at 07/24/12 2139  . amiodarone (PACERONE) tablet 400 mg  400 mg Oral BID Nada Boozer, NP   400 mg at 08/01/12 0934  . aspirin EC tablet 81 mg  81 mg Oral Daily Nada Boozer, NP   81 mg at 08/01/12 0934  . cholecalciferol (VITAMIN D) tablet 2,000 Units  2,000 Units Oral Daily Nada Boozer, NP   2,000 Units at 08/01/12 0934  . diltiazem (CARDIZEM CD) 24 hr capsule 120 mg  120 mg Oral Daily Eda Paschal [place], [place]   120 mg at 08/01/12 0933  . docusate sodium (COLACE)  capsule 200 mg  200 mg Oral BID Nada Boozer, NP   200 mg at 08/01/12 0934  . ezetimibe (ZETIA) tablet 10 mg  10 mg Oral Daily Nada Boozer, NP   10 mg at 08/01/12 0934  . feeding supplement (ENSURE COMPLETE) liquid 237 mL  237 mL Oral BID BM Chrystie Nose, MD   237 mL at 08/01/12 0940  . furosemide (LASIX) tablet 80 mg  80 mg Oral BID Marykay Lex, MD   80 mg at 08/01/12 0934  . HYDROcodone-acetaminophen (NORCO/VICODIN) 5-325 MG per tablet 1 tablet  1 tablet Oral Q8H PRN Nada Boozer, NP   1 tablet at 08/01/12 0519  . loratadine (CLARITIN) tablet 10 mg  10 mg Oral Daily Nada Boozer, NP   10 mg at 08/01/12 0933  . metoprolol succinate (TOPROL-XL) 24 hr tablet 25 mg  25 mg Oral Daily Nada Boozer, NP   25 mg at 08/01/12 0933  . nitroGLYCERIN (NITROSTAT) SL tablet 0.4 mg  0.4 mg Sublingual Q5 Min x 3 PRN Nada Boozer, NP      . ondansetron [facility]) injection 4 mg  4 mg Intravenous Q6H PRN Nada Boozer, NP      . pantoprazole (PROTONIX) EC tablet 40 mg  40 mg Oral Q1200 Nada Boozer, NP  40 mg at 07/31/12 0935  . polyethylene glycol (MIRALAX / GLYCOLAX) packet 17 g  17 g Oral BID Nada Boozer, NP   17 g at 08/01/12 0934  . senna (SENOKOT) tablet 8.6 mg  1 tablet Oral Daily Nada Boozer, NP   8.6 mg at 08/01/12 0934  . warfarin (COUMADIN) tablet 5 mg  5 mg Oral q1800 Chrystie Nose, MD   5 mg at 07/31/12 2301  . Warfarin - Pharmacist Dosing Inpatient   Does not apply q1800 Chinita Greenland, PHARMD      . zolpidem [facility]) tablet 5 mg  5 mg Oral QHS Eda Paschal [place], [place]   5 mg at 07/31/12 2226    PE: General appearance: alert, cooperative and no distress Lungs: clear to auscultation bilaterally Heart: regular rate and rhythm and 1/6 systolic MM. Abdomen: +BS nontender. Extremities: Chronic edema Pulses: 2+ and symmetric Neurologic: Grossly normal  Lab Results:   Basename 08/01/12 0615 07/31/12 0615 07/30/12 0605  WBC 19.9* 15.3* 16.6*  HGB 10.0* 9.1* 8.0*  HCT 30.7* 28.7* 24.9*    PLT 544* 522* 519*   BMET  Basename 08/01/12 0615 07/31/12 0615 07/30/12 0605  NA 131* 134* 133*  K 5.8* 5.5* 5.1  CL 91* 92* 93*  CO2 25 30 32  GLUCOSE 85 75 81  BUN 42* 45* 45*  CREATININE 2.50* 2.61* 2.57*  CALCIUM 9.4 9.2 9.2   PT/INR  Basename 08/01/12 0615 07/31/12 0615 07/30/12 0605  LABPROT 22.8* 22.5* 19.5*  INR 2.11* 2.08* 1.71*   Assessment/Plan  Principal Problem:  *Sustained SVT, with need for emergent DCCV 07/24/12 Active Problems:  Pulmonary embolism, Oct 2012, suspected recurrence 07/24/12  CKD (chronic kidney disease) stage 4, GFR 15-29 ml/min  Hypertension  GERD (gastroesophageal reflux disease)  Morbid obesity  Acute on chronic diastolic congestive heart failure -- secondary to rapid SVT, aFib RVR  Lymphedema  Type II or unspecified type diabetes mellitus without mention of complication, "Diet" controlled  Presence of IVC filter  Aortic stenosis, moderate; Mean gradient of by cath  PAF, recurrent 07/24/12- Amiodarone added- NSR  H/O: GI bleed, severe (re-challenge with Coumadin this admission secondary to recuurent PE with IVCF)  CAD (coronary artery disease) - Mild-moderate with ~30% RCA lesion by cath  SOB (shortness of breath)  Hypotensive episode, secondary to SVT with HR 200  Respiratory failure, acute, secondary to tachycardia, diastolic HF  Chronic diastolic heart failure grade 1, NL LVF echo Aug 2013  Anemia  Plan:  Appears to be going in and out of Afib.  She was in afib with controlled rate when I looked at the monitor then converted a minute later.  Potassium is rising.  Now 5.8.  No extrate K given.  S/P one unit PRBCs.  Will give kayexalate.  No plans from GI.   HGB improved to 10.0 after PRBCs.  SCr stable.    DC time frame?  HH services.  Lives with her son who works.  SNF may need to be considered in the near future.  BNP down to 1500.  ~4.0L diuresed in the last two days.   LOS: 8 days    HAGER, BRYAN 08/01/2012 10:13  AM   Agree with note written by Jones Skene PAC  NSR today. Diuresed. SCr 2.5. WBC increased at 19K. Pt ambulated today with PT. I suspect she will need SNF as a bridge to going home.  Runell Gess 08/01/2012 4:43 PM

## 2012-08-01 NOTE — Progress Notes (Signed)
ANTICOAGULATION CONSULT NOTE - Follow Up Consult  Pharmacy Consult for Coumadin Indication: atrial fibrillation / ? PE  Labs:  Basename 08/01/12 0615 07/31/12 0615 07/30/12 0605  HGB 10.0* 9.1* --  HCT 30.7* 28.7* 24.9*  PLT 544* 522* 519*  APTT -- -- --  LABPROT 22.8* 22.5* 19.5*  INR 2.11* 2.08* 1.71*  HEPARINUNFRC -- -- 0.65  CREATININE 2.50* 2.61* 2.57*  CKTOTAL -- -- --  CKMB -- -- --  TROPONINI -- -- --    Assessment/Plan: INR now therapeutic at 2.11 History of vaginal bleeding on Coumadin - CBC stable  1) Continue Coumadin 5 mg po daily at 1800 2) Daily INR  Thank you. Okey Regal, PharmD [phone]

## 2012-08-01 NOTE — Progress Notes (Signed)
     [facility] Daily Rounding Note 08/01/2012, 8:48 AM  SUBJECTIVE:       No acute events overnight.    OBJECTIVE:         Vital signs in last 24 hours:    Temp:  [97.6 F (36.4 C)-98.4 F (36.9 C)] 98.3 F (36.8 C) (10/18 0500) Pulse Rate:  [78-85] 81  (10/18 0500) Resp:  [16-18] 16  (10/18 0500) BP: (106-131)/(48-63) 131/57 mmHg (10/18 0500) SpO2:  [96 %-100 %] 100 % (10/18 0500) Weight:  [109.907 kg (242 lb 4.8 oz)] 109.907 kg (242 lb 4.8 oz) (10/18 0500) Last BM Date: 07/31/12 General: pleasant, alert Heart: in sinus rhythm on monitor Chest: clear.  No dyspnea Abdomen: soft, obese, no mass or tenderness  Extremities: pedal edema, 1+ Neuro/Psych:  Pleasant, not confused.   Intake/Output from previous day: 10/17 0701 - 10/18 0700 In: 480 [P.O.:480] Out: 2100 [Urine:2100]  Lab Results:  [facility] 08/01/12 0615 07/31/12 0615 07/30/12 0605  WBC 19.9* 15.3* 16.6*  HGB 10.0* 9.1* 8.0*  HCT 30.7* 28.7* 24.9*  PLT 544* 522* 519*   BMET  Basename 08/01/12 0615 07/31/12 0615 07/30/12 0605  NA 131* 134* 133*  K 5.8* 5.5* 5.1  CL 91* 92* 93*  CO2 25 30 32  GLUCOSE 85 75 81  BUN 42* 45* 45*  CREATININE 2.50* 2.61* 2.57*  CALCIUM 9.4 9.2 9.2   LFT No results found for this basename: PROT:3,ALBUMIN:3,AST:3,ALT:3,ALKPHOS:3,BILITOT:3,BILIDIR:3,IBILI:3 in the last 72 hours PT/INR  Basename 08/01/12 0615 07/31/12 0615  LABPROT 22.8* 22.5*  INR 2.11* 2.08*    Studies/Results: Colonoscopy 01/2006  Few sigmoid tics.   Colonoscopy 03/2008 due to BPR Few sigmoid tics and internal hemorrhoids, later felt source for BPR   ASSESMENT: * Chronic, normocytic anemia. Iron studies c/w anemia of chronic disease. Stage 4 CKD likely contributor.  FOB negative.   Benign colonoscopy in 03/2008 * SVT, rate now controlled. Lingering Afib. On Coumadin, INR now therapeutic.  * Hx PE. Coumadin d/c'd early in 2013 because of urinary tract bleeding. Restarted this hospitalization.  * Hx  of uterine cancer, treated with radiation and TAH/SPO. Hx of uterine bleeding.  * Hx non-adenomatous colon polyps, none in 2007 or 2009.  * Narcotics induced constipation, well managed with current meds  * Large HH seen on CT scan abdomen in Jan 2013. On Omeprazole 20 mg at home, proonix 40 mg inpt.  Does not get reflux sxs.  * Diastolic CHF.  Weight down from 114.1 kg to 109.9 kg. No resp distress.  * Stage 4 CKD.  Last renal office visit with Dr Deterding was on 07/15/12.  Good urine output.    PLAN: *  Does she need to begin Epogen?  This is normally overseen by Renal service or hematology.   LOS: 8 days   Jennye Moccasin  08/01/2012, 8:48 AM Pager: [phone]   ________________________________________________________________________  [facility] MD note:  I personally examined the patient, reviewed the data and agree with the assessment and plan described above.  She's having no overt GI bleeding despite therapuetic coumadin.  It is safe to continue blood thinners as needed to treat her afib, history of blood clots.  Please call, page if any overt GI bleeding.   Rob Bunting, MD [facility] Gastro[facility]ology Pager [phone]

## 2012-08-02 LAB — BASIC METABOLIC PANEL
CO2: 32 mEq/L (ref 19–32)
Calcium: 9.1 mg/dL (ref 8.4–10.5)
Chloride: 93 mEq/L — ABNORMAL LOW (ref 96–112)
Potassium: 5 mEq/L (ref 3.5–5.1)
Sodium: 134 mEq/L — ABNORMAL LOW (ref 135–145)

## 2012-08-02 LAB — CBC
MCH: 30.1 pg (ref 26.0–34.0)
Platelets: 490 10*3/uL — ABNORMAL HIGH (ref 150–400)
RBC: 2.92 MIL/uL — ABNORMAL LOW (ref 3.87–5.11)
WBC: 21.5 10*3/uL — ABNORMAL HIGH (ref 4.0–10.5)

## 2012-08-02 LAB — PROTIME-INR
INR: 3.35 — ABNORMAL HIGH (ref 0.00–1.49)
Prothrombin Time: 32.1 seconds — ABNORMAL HIGH (ref 11.6–15.2)

## 2012-08-02 LAB — PRO B NATRIURETIC PEPTIDE: Pro B Natriuretic peptide (BNP): 1393 pg/mL — ABNORMAL HIGH (ref 0–450)

## 2012-08-02 NOTE — Progress Notes (Signed)
[facility]  DAILY PROGRESS NOTE   Subjective:  Rare episodes of brief AF with RVR on monitor. Overall <30 minutes in last 24 hours. No overt bleeding. Only complaint is joint pain.  Objective:  Temp:  [98.3 F (36.8 C)-98.6 F (37 C)] 98.3 F (36.8 C) (10/19 0500) Pulse Rate:  [80-90] 89  (10/19 0500) Resp:  [17] 17  (10/18 1430) BP: (104-119)/(52-62) 119/61 mmHg (10/19 0500) SpO2:  [100 %] 100 % (10/19 0500) Weight:  [110.814 kg (244 lb 4.8 oz)] 110.814 kg (244 lb 4.8 oz) (10/19 0500) Weight change: 0.907 kg (2 lb)  Intake/Output from previous day: 10/18 0701 - 10/19 0700 In: 480 [P.O.:480] Out: 1400 [Urine:1400]  Intake/Output from this shift:    Medications: Current Facility-Administered Medications  Medication Dose Route Frequency Provider Last Rate Last Dose  . 0.9 %  sodium chloride infusion   Intravenous Continuous Nada Boozer, NP 10 mL/hr at 07/29/12 0655    . 0.9 %  sodium chloride infusion   Intravenous Continuous Marykay Lex, MD 10 mL/hr at 07/25/12 [phone]    . acetaminophen (TYLENOL) tablet 650 mg  650 mg Oral Q4H PRN Nada Boozer, NP   650 mg at 08/01/12 1128  . albuterol (PROVENTIL HFA;VENTOLIN HFA) 108 (90 BASE) MCG/ACT inhaler 2 puff  2 puff Inhalation Q6H PRN Nada Boozer, NP   2 puff at 07/29/12 1844  . ALPRAZolam Prudy Feeler) tablet 0.25 mg  0.25 mg Oral Q8H PRN Abelino Derrick, PA   0.25 mg at 07/30/12 0645  . alum & mag hydroxide-simeth (MAALOX/MYLANTA) 200-200-20 MG/5ML suspension 30 mL  30 mL Oral Q4H PRN Nada Boozer, NP   30 mL at 07/24/12 2139  . amiodarone (PACERONE) tablet 400 mg  400 mg Oral BID Nada Boozer, NP   400 mg at 08/01/12 2204  . aspirin EC tablet 81 mg  81 mg Oral Daily Nada Boozer, NP   81 mg at 08/01/12 0934  . cholecalciferol (VITAMIN D) tablet 2,000 Units  2,000 Units Oral Daily Nada Boozer, NP   2,000 Units at 08/01/12 0934  . diltiazem (CARDIZEM CD) 24 hr capsule 120 mg  120 mg Oral Daily Eda Paschal  [place], [place]   120 mg at 08/01/12 0933  . docusate sodium (COLACE) capsule 200 mg  200 mg Oral BID Nada Boozer, NP   200 mg at 08/01/12 2204  . ezetimibe (ZETIA) tablet 10 mg  10 mg Oral Daily Nada Boozer, NP   10 mg at 08/01/12 0934  . feeding supplement (ENSURE COMPLETE) liquid 237 mL  237 mL Oral BID BM Chrystie Nose, MD   237 mL at 08/01/12 1837  . furosemide (LASIX) tablet 80 mg  80 mg Oral BID Marykay Lex, MD   80 mg at 08/01/12 1837  . HYDROcodone-acetaminophen (NORCO/VICODIN) 5-325 MG per tablet 1 tablet  1 tablet Oral Q8H PRN Nada Boozer, NP   1 tablet at 08/01/12 2209  . loratadine (CLARITIN) tablet 10 mg  10 mg Oral Daily Nada Boozer, NP   10 mg at 08/01/12 0933  . metoprolol succinate (TOPROL-XL) 24 hr tablet 25 mg  25 mg Oral Daily Nada Boozer, NP   25 mg at 08/01/12 0933  . nitroGLYCERIN (NITROSTAT) SL tablet 0.4 mg  0.4 mg Sublingual Q5 Min x 3 PRN Nada Boozer, NP      . ondansetron [facility]) injection 4 mg  4 mg Intravenous Q6H PRN Nada Boozer, NP   4 mg at  08/01/12 1241  . pantoprazole (PROTONIX) EC tablet 40 mg  40 mg Oral Q1200 Nada Boozer, NP   40 mg at 08/01/12 1241  . polyethylene glycol (MIRALAX / GLYCOLAX) packet 17 g  17 g Oral BID Nada Boozer, NP   17 g at 08/01/12 0934  . senna (SENOKOT) tablet 8.6 mg  1 tablet Oral Daily Nada Boozer, NP   8.6 mg at 08/01/12 0934  . sodium polystyrene (KAYEXALATE) 15 GM/60ML suspension 15 g  15 g Oral Once Wilburt Finlay, PA   15 g at 08/01/12 1129  . warfarin (COUMADIN) tablet 5 mg  5 mg Oral q1800 Chrystie Nose, MD   5 mg at 08/01/12 1837  . Warfarin - Pharmacist Dosing Inpatient   Does not apply q1800 Chinita Greenland, PHARMD      . zolpidem [facility]) tablet 5 mg  5 mg Oral QHS Eda Paschal [place], [place]   5 mg at 08/01/12 2204    Physical Exam: General appearance: alert, cooperative, no distress and morbidly obese Neck: no adenopathy, no carotid bruit, supple, symmetrical, trachea midline and thyroid not enlarged, symmetric,  no tenderness/mass/nodules Lungs: clear to auscultation bilaterally Heart: regular rate and rhythm, S1, S2 normal, no murmur, click, rub or gallop Abdomen: soft, non-tender; bowel sounds normal; no masses,  no organomegaly Extremities: edema 3+ symmetrical LE Skin: Skin color, texture, turgor normal. No rashes or lesions Neurologic: Grossly normal  Lab Results: Results for orders placed during the hospital encounter of 07/24/12 (from the past 48 hour(s))  PROTIME-INR     Status: Abnormal   Collection Time   08/01/12  6:15 AM      Component Value Range Comment   Prothrombin Time 22.8 (*) 11.6 - 15.2 seconds    INR 2.11 (*) 0.00 - 1.49   CBC     Status: Abnormal   Collection Time   08/01/12  6:15 AM      Component Value Range Comment   WBC 19.9 (*) 4.0 - 10.5 K/uL    RBC 3.28 (*) 3.87 - 5.11 MIL/uL    Hemoglobin 10.0 (*) 12.0 - 15.0 g/dL    HCT 16.1 (*) 09.6 - 46.0 %    MCV 93.6  78.0 - 100.0 fL    MCH 30.5  26.0 - 34.0 pg    MCHC 32.6  30.0 - 36.0 g/dL    RDW 04.5 (*) 40.9 - 15.5 %    Platelets 544 (*) 150 - 400 K/uL   BASIC METABOLIC PANEL     Status: Abnormal   Collection Time   08/01/12  6:15 AM      Component Value Range Comment   Sodium 131 (*) 135 - 145 mEq/L    Potassium 5.8 (*) 3.5 - 5.1 mEq/L HEMOLYSIS AT THIS LEVEL MAY AFFECT RESULT   Chloride 91 (*) 96 - 112 mEq/L    CO2 25  19 - 32 mEq/L    Glucose, Bld 85  70 - 99 mg/dL    BUN 42 (*) 6 - 23 mg/dL    Creatinine, Ser 8.11 (*) 0.50 - 1.10 mg/dL    Calcium 9.4  8.4 - 91.4 mg/dL    GFR calc non Af Amer 17 (*) >90 mL/min    GFR calc Af Amer 20 (*) >90 mL/min   PRO B NATRIURETIC PEPTIDE     Status: Abnormal   Collection Time   08/01/12  6:15 AM      Component Value Range Comment   Pro B  Natriuretic peptide (BNP) 1502.0 (*) 0 - 450 pg/mL   PRO B NATRIURETIC PEPTIDE     Status: Abnormal   Collection Time   08/02/12  5:00 AM      Component Value Range Comment   Pro B Natriuretic peptide (BNP) 1393.0 (*) 0 - 450  pg/mL   PROTIME-INR     Status: Abnormal   Collection Time   08/02/12  6:30 AM      Component Value Range Comment   Prothrombin Time 32.1 (*) 11.6 - 15.2 seconds    INR 3.35 (*) 0.00 - 1.49   CBC     Status: Abnormal   Collection Time   08/02/12  6:30 AM      Component Value Range Comment   WBC 21.5 (*) 4.0 - 10.5 K/uL    RBC 2.92 (*) 3.87 - 5.11 MIL/uL    Hemoglobin 8.8 (*) 12.0 - 15.0 g/dL    HCT 16.1 (*) 09.6 - 46.0 %    MCV 93.8  78.0 - 100.0 fL    MCH 30.1  26.0 - 34.0 pg    MCHC 32.1  30.0 - 36.0 g/dL    RDW 04.5 (*) 40.9 - 15.5 %    Platelets 490 (*) 150 - 400 K/uL   BASIC METABOLIC PANEL     Status: Abnormal   Collection Time   08/02/12  6:30 AM      Component Value Range Comment   Sodium 134 (*) 135 - 145 mEq/L    Potassium 5.0  3.5 - 5.1 mEq/L    Chloride 93 (*) 96 - 112 mEq/L    CO2 32  19 - 32 mEq/L    Glucose, Bld 97  70 - 99 mg/dL    BUN 43 (*) 6 - 23 mg/dL    Creatinine, Ser 8.11 (*) 0.50 - 1.10 mg/dL    Calcium 9.1  8.4 - 91.4 mg/dL    GFR calc non Af Amer 16 (*) >90 mL/min    GFR calc Af Amer 18 (*) >90 mL/min     Imaging: No results found.  Assessment:  1. Principal Problem: 2.  *Sustained SVT, with need for emergent DCCV 07/24/12 3. Active Problems: 4.  Pulmonary embolism, Oct 2012, suspected recurrence 07/24/12 5.  CKD (chronic kidney disease) stage 4, GFR 15-29 ml/min 6.  Hypertension 7.  GERD (gastroesophageal reflux disease) 8.  Morbid obesity 9.  Acute on chronic diastolic congestive heart failure -- secondary to rapid SVT, aFib RVR 10.  Lymphedema 11.  Type II or unspecified type diabetes mellitus without mention of complication, "Diet" controlled 12.  Presence of IVC filter 13.  Aortic stenosis, moderate; Mean gradient of by cath 14.  PAF, recurrent 07/24/12- Amiodarone added- NSR 15.  H/O: GI bleed, severe (re-challenge with Coumadin this admission secondary to recuurent PE with IVCF) 16.  CAD (coronary artery disease) -  Mild-moderate with ~30% RCA lesion by cath 17.  SOB (shortness of breath) 18.  Hypotensive episode, secondary to SVT with HR 200 19.  Respiratory failure, acute, secondary to tachycardia, diastolic HF 20.  Chronic diastolic heart failure grade 1, NL LVF echo Aug 2013 21.  Anemia 22.   Plan:  1. K level improved 2. Creatinine about the same 3. Hemoglobin has dropped a little and WBC continues to rise 4. INR supratherapeutic, probably related to amiodarone interaction - hold warfarin (and may have to stop altogether again unless a solution to her anemia can be identified). 5. Will ask  Dr. Darrick Penna, her nephrologist, to evaluate indication for EPO. 6. Plan for SNF at discharge. 7.  Time Spent Directly with Patient:  30 minutes  Length of Stay:  LOS: 9 days    Klayton Monie 08/02/2012, 8:31 AM

## 2012-08-03 ENCOUNTER — Inpatient Hospital Stay (HOSPITAL_COMMUNITY): Payer: Medicare Other

## 2012-08-03 LAB — CBC
MCH: 29.6 pg (ref 26.0–34.0)
Platelets: 522 10*3/uL — ABNORMAL HIGH (ref 150–400)
RBC: 2.87 MIL/uL — ABNORMAL LOW (ref 3.87–5.11)

## 2012-08-03 LAB — URINALYSIS, ROUTINE W REFLEX MICROSCOPIC
Ketones, ur: NEGATIVE mg/dL
Nitrite: POSITIVE — AB
Protein, ur: 100 mg/dL — AB
Urobilinogen, UA: 0.2 mg/dL (ref 0.0–1.0)
pH: 8.5 — ABNORMAL HIGH (ref 5.0–8.0)

## 2012-08-03 LAB — TYPE AND SCREEN
ABO/RH(D): O POS
Antibody Screen: NEGATIVE
Unit division: 0

## 2012-08-03 LAB — PROTIME-INR: Prothrombin Time: 32.9 seconds — ABNORMAL HIGH (ref 11.6–15.2)

## 2012-08-03 LAB — PRO B NATRIURETIC PEPTIDE: Pro B Natriuretic peptide (BNP): 1307 pg/mL — ABNORMAL HIGH (ref 0–450)

## 2012-08-03 LAB — BASIC METABOLIC PANEL
CO2: 31 mEq/L (ref 19–32)
Calcium: 9.1 mg/dL (ref 8.4–10.5)
GFR calc non Af Amer: 15 mL/min — ABNORMAL LOW (ref >90)
Sodium: 133 mEq/L — ABNORMAL LOW (ref 135–145)

## 2012-08-03 LAB — URINE MICROSCOPIC-ADD ON

## 2012-08-03 MED ORDER — HYDROCODONE-ACETAMINOPHEN 5-325 MG PO TABS
1.0000 | ORAL_TABLET | ORAL | Status: DC | PRN
  Administered 2012-08-03 – 2012-08-04 (×4): 2 via ORAL
  Administered 2012-08-04: 1 via ORAL
  Administered 2012-08-05 – 2012-08-06 (×4): 2 via ORAL
  Administered 2012-08-07 (×2): 1 via ORAL
  Administered 2012-08-07 – 2012-08-09 (×4): 2 via ORAL
  Administered 2012-08-10: 1 via ORAL
  Administered 2012-08-10 (×2): 2 via ORAL
  Administered 2012-08-11: 1 via ORAL
  Administered 2012-08-11 – 2012-08-12 (×2): 2 via ORAL
  Filled 2012-08-03 (×3): qty 2
  Filled 2012-08-03: qty 1
  Filled 2012-08-03: qty 2
  Filled 2012-08-03: qty 1
  Filled 2012-08-03 (×3): qty 2
  Filled 2012-08-03: qty 1
  Filled 2012-08-03 (×11): qty 2

## 2012-08-03 NOTE — Progress Notes (Signed)
[facility]  DAILY PROGRESS NOTE   Subjective:  Only complaint is left shoulder pain, better when she massages it and after analgesics. No fever or chills. No cough, pleurisy has resolved, no urinary symptoms or abdominal pain. Constipated. No overt bleeding.  WBC increased further to 24K. Hgb stable at 8.5. INR still supratherapeutic, rose slightly. Two brief AFib episodes with remarkably fast VR (>180), but lasting under 2 min each.   Objective:  Temp:  [98.1 F (36.7 C)-98.6 F (37 C)] 98.6 F (37 C) (10/20 0500) Pulse Rate:  [82-85] 82  (10/20 0500) Resp:  [18] 18  (10/20 0500) BP: (92-123)/(37-58) 98/53 mmHg (10/20 0500) SpO2:  [100 %] 100 % (10/20 0500) Weight:  [109.181 kg (240 lb 11.2 oz)] 109.181 kg (240 lb 11.2 oz) (10/20 0500) Weight change: -1.633 kg (-3 lb 9.6 oz)  Intake/Output from previous day: 10/19 0701 - 10/20 0700 In: 240 [P.O.:240] Out: 551 [Urine:550; Stool:1]  Intake/Output from this shift:    Medications: Current Facility-Administered Medications  Medication Dose Route Frequency Provider Last Rate Last Dose  . 0.9 %  sodium chloride infusion   Intravenous Continuous Nada Boozer, NP 10 mL/hr at 07/29/12 0655    . 0.9 %  sodium chloride infusion   Intravenous Continuous Marykay Lex, MD 10 mL/hr at 07/25/12 [phone]    . acetaminophen (TYLENOL) tablet 650 mg  650 mg Oral Q4H PRN Nada Boozer, NP   650 mg at 08/02/12 2354  . albuterol (PROVENTIL HFA;VENTOLIN HFA) 108 (90 BASE) MCG/ACT inhaler 2 puff  2 puff Inhalation Q6H PRN Nada Boozer, NP   2 puff at 07/29/12 1844  . ALPRAZolam Prudy Feeler) tablet 0.25 mg  0.25 mg Oral Q8H PRN Abelino Derrick, PA   0.25 mg at 08/02/12 1728  . alum & mag hydroxide-simeth (MAALOX/MYLANTA) 200-200-20 MG/5ML suspension 30 mL  30 mL Oral Q4H PRN Nada Boozer, NP   30 mL at 07/24/12 2139  . amiodarone (PACERONE) tablet 400 mg  400 mg Oral BID Nada Boozer, NP   400 mg at 08/02/12 2146  . aspirin EC  tablet 81 mg  81 mg Oral Daily Nada Boozer, NP   81 mg at 08/02/12 1009  . cholecalciferol (VITAMIN D) tablet 2,000 Units  2,000 Units Oral Daily Nada Boozer, NP   2,000 Units at 08/02/12 1009  . diltiazem (CARDIZEM CD) 24 hr capsule 120 mg  120 mg Oral Daily Eda Paschal [place], [place]   120 mg at 08/02/12 1009  . docusate sodium (COLACE) capsule 200 mg  200 mg Oral BID Nada Boozer, NP   200 mg at 08/02/12 2147  . ezetimibe (ZETIA) tablet 10 mg  10 mg Oral Daily Nada Boozer, NP   10 mg at 08/02/12 1008  . feeding supplement (ENSURE COMPLETE) liquid 237 mL  237 mL Oral BID BM Chrystie Nose, MD   237 mL at 08/02/12 1530  . furosemide (LASIX) tablet 80 mg  80 mg Oral BID Marykay Lex, MD   80 mg at 08/02/12 1703  . HYDROcodone-acetaminophen (NORCO/VICODIN) 5-325 MG per tablet 1 tablet  1 tablet Oral Q8H PRN Nada Boozer, NP   1 tablet at 08/03/12 9604  . loratadine (CLARITIN) tablet 10 mg  10 mg Oral Daily Nada Boozer, NP   10 mg at 08/02/12 1013  . metoprolol succinate (TOPROL-XL) 24 hr tablet 25 mg  25 mg Oral Daily Nada Boozer, NP   25 mg at 08/02/12 1303  .  nitroGLYCERIN (NITROSTAT) SL tablet 0.4 mg  0.4 mg Sublingual Q5 Min x 3 PRN Nada Boozer, NP      . ondansetron [facility]) injection 4 mg  4 mg Intravenous Q6H PRN Nada Boozer, NP   4 mg at 08/01/12 1241  . pantoprazole (PROTONIX) EC tablet 40 mg  40 mg Oral Q1200 Nada Boozer, NP   40 mg at 08/02/12 1300  . polyethylene glycol (MIRALAX / GLYCOLAX) packet 17 g  17 g Oral BID Nada Boozer, NP   17 g at 08/02/12 1008  . senna (SENOKOT) tablet 8.6 mg  1 tablet Oral Daily Nada Boozer, NP   8.6 mg at 08/02/12 1009  . zolpidem (AMBIEN) tablet 5 mg  5 mg Oral QHS Eda Paschal [place], [place]   5 mg at 08/02/12 2147    Physical Exam: General appearance: alert, cooperative, no distress and morbidly obese  Neck: no adenopathy, no carotid bruit, supple, symmetrical, trachea midline and thyroid not enlarged, symmetric, no tenderness/mass/nodules  Lungs: clear  to auscultation bilaterally  Heart: regular rate and rhythm, S1, S2 normal, no click, rub or gallop. 2-3/6 early peaking aortic ejection murmur. Abdomen: soft, non-tender; bowel sounds normal; no masses, no organomegaly  Extremities: edema 3+ symmetrical LE  Skin: Skin color, texture, turgor normal. No rashes or lesions  Neurologic: Grossly normal  Lab Results: Results for orders placed during the hospital encounter of 07/24/12 (from the past 48 hour(s))  PRO B NATRIURETIC PEPTIDE     Status: Abnormal   Collection Time   08/02/12  5:00 AM      Component Value Range Comment   Pro B Natriuretic peptide (BNP) 1393.0 (*) 0 - 450 pg/mL   PROTIME-INR     Status: Abnormal   Collection Time   08/02/12  6:30 AM      Component Value Range Comment   Prothrombin Time 32.1 (*) 11.6 - 15.2 seconds    INR 3.35 (*) 0.00 - 1.49   CBC     Status: Abnormal   Collection Time   08/02/12  6:30 AM      Component Value Range Comment   WBC 21.5 (*) 4.0 - 10.5 K/uL    RBC 2.92 (*) 3.87 - 5.11 MIL/uL    Hemoglobin 8.8 (*) 12.0 - 15.0 g/dL    HCT 16.1 (*) 09.6 - 46.0 %    MCV 93.8  78.0 - 100.0 fL    MCH 30.1  26.0 - 34.0 pg    MCHC 32.1  30.0 - 36.0 g/dL    RDW 04.5 (*) 40.9 - 15.5 %    Platelets 490 (*) 150 - 400 K/uL   BASIC METABOLIC PANEL     Status: Abnormal   Collection Time   08/02/12  6:30 AM      Component Value Range Comment   Sodium 134 (*) 135 - 145 mEq/L    Potassium 5.0  3.5 - 5.1 mEq/L    Chloride 93 (*) 96 - 112 mEq/L    CO2 32  19 - 32 mEq/L    Glucose, Bld 97  70 - 99 mg/dL    BUN 43 (*) 6 - 23 mg/dL    Creatinine, Ser 8.11 (*) 0.50 - 1.10 mg/dL    Calcium 9.1  8.4 - 91.4 mg/dL    GFR calc non Af Amer 16 (*) >90 mL/min    GFR calc Af Amer 18 (*) >90 mL/min   PROTIME-INR     Status: Abnormal   Collection Time  08/03/12  5:59 AM      Component Value Range Comment   Prothrombin Time 32.9 (*) 11.6 - 15.2 seconds    INR 3.47 (*) 0.00 - 1.49   CBC     Status: Abnormal    Collection Time   08/03/12  5:59 AM      Component Value Range Comment   WBC 24.9 (*) 4.0 - 10.5 K/uL    RBC 2.87 (*) 3.87 - 5.11 MIL/uL    Hemoglobin 8.5 (*) 12.0 - 15.0 g/dL    HCT 45.4 (*) 09.8 - 46.0 %    MCV 93.4  78.0 - 100.0 fL    MCH 29.6  26.0 - 34.0 pg    MCHC 31.7  30.0 - 36.0 g/dL    RDW 11.9 (*) 14.7 - 15.5 %    Platelets 522 (*) 150 - 400 K/uL   BASIC METABOLIC PANEL     Status: Abnormal   Collection Time   08/03/12  5:59 AM      Component Value Range Comment   Sodium 133 (*) 135 - 145 mEq/L    Potassium 5.3 (*) 3.5 - 5.1 mEq/L    Chloride 92 (*) 96 - 112 mEq/L    CO2 31  19 - 32 mEq/L    Glucose, Bld 79  70 - 99 mg/dL    BUN 45 (*) 6 - 23 mg/dL    Creatinine, Ser 8.29 (*) 0.50 - 1.10 mg/dL    Calcium 9.1  8.4 - 56.2 mg/dL    GFR calc non Af Amer 15 (*) >90 mL/min    GFR calc Af Amer 17 (*) >90 mL/min   PRO B NATRIURETIC PEPTIDE     Status: Abnormal   Collection Time   08/03/12  5:59 AM      Component Value Range Comment   Pro B Natriuretic peptide (BNP) 1307.0 (*) 0 - 450 pg/mL     Imaging: No results found.  Assessment:  1. Principal Problem: 2.  *Sustained SVT, with need for emergent DCCV 07/24/12 3. Active Problems: 4.  Pulmonary embolism, Oct 2012, suspected recurrence 07/24/12 5.  CKD (chronic kidney disease) stage 4, GFR 15-29 ml/min 6.  Hypertension 7.  GERD (gastroesophageal reflux disease) 8.  Morbid obesity 9.  Acute on chronic diastolic congestive heart failure -- secondary to rapid SVT, aFib RVR 10.  Lymphedema 11.  Type II or unspecified type diabetes mellitus without mention of complication, "Diet" controlled 12.  Presence of IVC filter 13.  Aortic stenosis, moderate; Mean gradient of by cath 14.  PAF, recurrent 07/24/12- Amiodarone added- NSR 15.  H/O: GI bleed, severe (re-challenge with Coumadin this admission secondary to recuurent PE with IVCF) 16.  CAD (coronary artery disease) - Mild-moderate with ~30% RCA lesion by  cath 17.  SOB (shortness of breath) 18.  Hypotensive episode, secondary to SVT with HR 200 19.  Respiratory failure, acute, secondary to tachycardia, diastolic HF 20.  Chronic diastolic heart failure grade 1, NL LVF echo Aug 2013 21.  Anemia 22.   Plan:  1. PAF - improving as amio "kicks in". 2. Anemia - appears to be hyporegenerative, normal iron stores. Consult Nephrology re: EPO. 3. Recurrent DVT/PE s/p IVC filter. Will resume warfarin if Hgb remains stable and INR drops under 3. 4. Elevated WBC is unexplained - reevaluate UA, CXR; consider repeat echo 5. Initiate plans for SNF, but not yet ready for DC.  Time Spent Directly with Patient:  30 minutes  Length  of Stay:  LOS: 10 days    [name] 08/03/2012, 9:12 AM

## 2012-08-03 NOTE — Clinical Social Work Placement (Addendum)
Clinical Social Work Department CLINICAL SOCIAL WORK PLACEMENT NOTE 08/03/2012  Patient:  [name], Christine [name]  Account Number:  [id number] Admit date:  07/24/2012  Clinical Social Worker:  Ashley Jacobs, LCSW  Date/time:  08/03/2012 02:05 PM  Clinical Social Work is seeking post-discharge placement for this patient at the following level of care:   SKILLED NURSING   (*CSW will update this form in Epic as items are completed)   08/03/2012  Patient/family provided with [facility] Department of Clinical Social Work's list of facilities offering this level of care within the geographic area requested by the patient (or if unable, by the patient's family).  08/03/2012  Patient/family informed of their freedom to choose among providers that offer the needed level of care, that participate in Medicare, Medicaid or managed care program needed by the patient, have an available bed and are willing to accept the patient.  08/03/2012  Patient/family informed of [facility]' ownership interest in [facility], as well as of the fact that they are under no obligation to receive care at this facility.  PASARR submitted to EDS on 08/03/2012 PASARR number received from EDS on 08/03/2012  FL2 transmitted to all facilities in geographic area requested by pt/family on  08/03/2012 FL2 transmitted to all facilities within larger geographic area on   Patient informed that his/her managed care company has contracts with or will negotiate with  certain facilities, including the following:     Patient/family informed of bed offers received: 08/04/12 ([facility]) Patient chooses bed at [facility]) Physician recommends and patient chooses bed at    Patient to be transferred to  on   Patient to be transferred to facility by   The following physician request were entered in Epic:   Additional Comments:  Ashley Jacobs, MSW LCSW [phone]

## 2012-08-03 NOTE — Clinical Social Work Psychosocial (Signed)
Clinical Social Work Department BRIEF PSYCHOSOCIAL ASSESSMENT 08/03/2012  Patient:  Christine [name], Christine [name]     Account Number:  [id number]     Admit date:  07/24/2012  Clinical Social Worker:  Andres Shad  Date/Time:  08/03/2012 01:28 PM  Referred by:  Physician  Date Referred:  08/03/2012 Referred for  SNF Placement   Other Referral:   Disposition   Interview type:  Patient Other interview type:   Chart review including CM and PT notes    PSYCHOSOCIAL DATA Living Status:  WITH ADULT CHILDREN Admitted from facility:   Level of care:   Primary support name:  Son :  Christine [name] Primary support relationship to patient:  CHILD, ADULT Degree of support available:   Patient has multiple children for support and all are currently supportive    CURRENT CONCERNS Current Concerns  Post-Acute Placement   Other Concerns:   HH vs SNF    SOCIAL WORK ASSESSMENT / PLAN CSW covering weekend received new consult for SNF.  Reviewed chart to see patient has been set up with [facility], but new orders for SNF have been completed.    Met with patient at the bedside with regards to DC plan. Patient very anxious and did not know what the doctor and her family had decided. patient herself would not make a decision of home or home health. Currently she reports she lives at home with her son and has a Product/process development scientist come in throughout the day with son at home.    Called son, spoke with Christine [name] (number in facesheet) Reports, that yes, SNF is the plan at DC with patient and family requesting [facility] at dc.  Son reports, there may be a balance, but family will pay the balance for patient to be readmitted.  Agreeable for clinicals to be faxed to facility and aware Unit CSW will follow up Monday.    Patient aware son was called and plan for search to begin in which she was also agreeable.   Assessment/plan status:  Psychosocial Support/Ongoing Assessment of Needs Other assessment/ plan:     Information/referral to community resources:   SNF search for [facility]    PATIENT'S/FAMILY'S RESPONSE TO PLAN OF CARE: Christine [name] reports all family aware and agreeable to plan to SNF.   Christine [name], MSW LCSW [phone]

## 2012-08-04 LAB — PROTIME-INR: Prothrombin Time: 32.4 seconds — ABNORMAL HIGH (ref 11.6–15.2)

## 2012-08-04 LAB — CBC
MCH: 30 pg (ref 26.0–34.0)
MCHC: 32.1 g/dL (ref 30.0–36.0)
Platelets: 513 10*3/uL — ABNORMAL HIGH (ref 150–400)

## 2012-08-04 LAB — PRO B NATRIURETIC PEPTIDE: Pro B Natriuretic peptide (BNP): 1489 pg/mL — ABNORMAL HIGH (ref 0–450)

## 2012-08-04 LAB — BASIC METABOLIC PANEL
Calcium: 9.2 mg/dL (ref 8.4–10.5)
Creatinine, Ser: 3.03 mg/dL — ABNORMAL HIGH (ref 0.50–1.10)
GFR calc non Af Amer: 14 mL/min — ABNORMAL LOW (ref >90)
Sodium: 130 mEq/L — ABNORMAL LOW (ref 135–145)

## 2012-08-04 MED ORDER — CIPROFLOXACIN HCL 500 MG PO TABS
500.0000 mg | ORAL_TABLET | Freq: Every day | ORAL | Status: DC
  Administered 2012-08-04 – 2012-08-09 (×6): 500 mg via ORAL
  Filled 2012-08-04 (×7): qty 1

## 2012-08-04 MED ORDER — NITROFURANTOIN MONOHYD MACRO 100 MG PO CAPS: 100.0000 mg | ORAL_CAPSULE | Freq: Two times a day (BID) | ORAL | Status: DC

## 2012-08-04 MED ORDER — DARBEPOETIN ALFA-POLYSORBATE 150 MCG/0.3ML IJ SOLN
150.0000 ug | Freq: Once | INTRAMUSCULAR | Status: AC
  Administered 2012-08-04: 150 ug via SUBCUTANEOUS
  Filled 2012-08-04: qty 0.3

## 2012-08-04 NOTE — Progress Notes (Signed)
[facility]  Subjective: Hands hurt.  Shoulders feel better.  Objective: Vital signs in last 24 hours: Temp:  [97.8 F (36.6 C)-98 F (36.7 C)] 98 F (36.7 C) (10/21 0600) Pulse Rate:  [79-85] 85  (10/21 0942) Resp:  [15-18] 15  (10/21 0600) BP: (101-120)/(60-78) 107/60 mmHg (10/21 0942) SpO2:  [100 %] 100 % (10/21 0600) Last BM Date: 08/03/12  Intake/Output from previous day: 10/20 0701 - 10/21 0700 In: -  Out: 1101 [Urine:1100; Stool:1] Intake/Output this shift:    Medications Current Facility-Administered Medications  Medication Dose Route Frequency Provider Last Rate Last Dose  . 0.9 %  sodium chloride infusion   Intravenous Continuous Nada Boozer, NP 10 mL/hr at 07/29/12 0655    . 0.9 %  sodium chloride infusion   Intravenous Continuous Marykay Lex, MD 10 mL/hr at 07/25/12 [phone]    . acetaminophen (TYLENOL) tablet 650 mg  650 mg Oral Q4H PRN Nada Boozer, NP   650 mg at 08/02/12 2354  . albuterol (PROVENTIL HFA;VENTOLIN HFA) 108 (90 BASE) MCG/ACT inhaler 2 puff  2 puff Inhalation Q6H PRN Nada Boozer, NP   2 puff at 07/29/12 1844  . ALPRAZolam Prudy Feeler) tablet 0.25 mg  0.25 mg Oral Q8H PRN Abelino Derrick, PA   0.25 mg at 08/02/12 1728  . alum & mag hydroxide-simeth (MAALOX/MYLANTA) 200-200-20 MG/5ML suspension 30 mL  30 mL Oral Q4H PRN Nada Boozer, NP   30 mL at 07/24/12 2139  . amiodarone (PACERONE) tablet 400 mg  400 mg Oral BID Nada Boozer, NP   400 mg at 08/04/12 0942  . aspirin EC tablet 81 mg  81 mg Oral Daily Nada Boozer, NP   81 mg at 08/04/12 0942  . cholecalciferol (VITAMIN D) tablet 2,000 Units  2,000 Units Oral Daily Nada Boozer, NP   2,000 Units at 08/04/12 [phone]  . diltiazem (CARDIZEM CD) 24 hr capsule 120 mg  120 mg Oral Daily Eda Paschal [place], [place]   120 mg at 08/04/12 0942  . docusate sodium (COLACE) capsule 200 mg  200 mg Oral BID Nada Boozer, NP   200 mg at 08/04/12 0942  . ezetimibe (ZETIA) tablet 10 mg  10 mg Oral Daily  Nada Boozer, NP   10 mg at 08/04/12 0942  . feeding supplement (ENSURE COMPLETE) liquid 237 mL  237 mL Oral BID BM Chrystie Nose, MD   237 mL at 08/04/12 0946  . furosemide (LASIX) tablet 80 mg  80 mg Oral BID Marykay Lex, MD   80 mg at 08/04/12 0942  . HYDROcodone-acetaminophen (NORCO/VICODIN) 5-325 MG per tablet 1-2 tablet  1-2 tablet Oral Q4H PRN Thurmon Fair, MD   1 tablet at 08/04/12 0941  . loratadine (CLARITIN) tablet 10 mg  10 mg Oral Daily Nada Boozer, NP   10 mg at 08/04/12 0942  . metoprolol succinate (TOPROL-XL) 24 hr tablet 25 mg  25 mg Oral Daily Nada Boozer, NP   25 mg at 08/04/12 0942  . nitroGLYCERIN (NITROSTAT) SL tablet 0.4 mg  0.4 mg Sublingual Q5 Min x 3 PRN Nada Boozer, NP      . ondansetron [facility]) injection 4 mg  4 mg Intravenous Q6H PRN Nada Boozer, NP   4 mg at 08/01/12 1241  . pantoprazole (PROTONIX) EC tablet 40 mg  40 mg Oral Q1200 Nada Boozer, NP   40 mg at 08/04/12 0941  . polyethylene glycol (MIRALAX / GLYCOLAX) packet 17 g  17 g  Oral BID Nada Boozer, NP   17 g at 08/04/12 4098  . senna (SENOKOT) tablet 8.6 mg  1 tablet Oral Daily Nada Boozer, NP   8.6 mg at 08/04/12 0942  . zolpidem (AMBIEN) tablet 5 mg  5 mg Oral QHS Eda Paschal [place], [place]   5 mg at 08/03/12 2320  . DISCONTD: HYDROcodone-acetaminophen (NORCO/VICODIN) 5-325 MG per tablet 1 tablet  1 tablet Oral Q8H PRN Nada Boozer, NP   1 tablet at 08/03/12 1191    PE: General appearance: alert, cooperative and no distress Lungs: clear to auscultation bilaterally Heart: regular rate and rhythm and 1/6 sys mm Abdomen: +BS Skin: warm and dry Neurologic: Grossly normal Pulses:  2+ radials  Lab Results:   Basename 08/04/12 0729 08/03/12 0559 08/02/12 0630  WBC 21.7* 24.9* 21.5*  HGB 8.5* 8.5* 8.8*  HCT 26.5* 26.8* 27.4*  PLT 513* 522* 490*   BMET  Basename 08/04/12 0729 08/03/12 0559 08/02/12 0630  NA 130* 133* 134*  K 5.5* 5.3* 5.0  CL 88* 92* 93*  CO2 34* 31 32  GLUCOSE 85 79 97    BUN 50* 45* 43*  CREATININE 3.03* 2.88* 2.72*  CALCIUM 9.2 9.1 9.1   PT/INR  Basename 08/04/12 0729 08/03/12 0559 08/02/12 0630  LABPROT 32.4* 32.9* 32.1*  INR 3.40* 3.47* 3.35*    Assessment/Plan  Principal Problem:  *Sustained SVT, with need for emergent DCCV 07/24/12 Active Problems:  Pulmonary embolism, Oct 2012, suspected recurrence 07/24/12  CKD (chronic kidney disease) stage 4, GFR 15-29 ml/min  Hypertension  GERD (gastroesophageal reflux disease)  Morbid obesity  Acute on chronic diastolic congestive heart failure -- secondary to rapid SVT, aFib RVR  Lymphedema  Type II or unspecified type diabetes mellitus without mention of complication, "Diet" controlled  Presence of IVC filter  Aortic stenosis, moderate; Mean gradient of by cath  PAF, recurrent 07/24/12- Amiodarone added- NSR  H/O: GI bleed, severe (re-challenge with Coumadin this admission secondary to recuurent PE with IVCF)  CAD (coronary artery disease) - Mild-moderate with ~30% RCA lesion by cath  SOB (shortness of breath)  Hypotensive episode, secondary to SVT with HR 200  Respiratory failure, acute, secondary to tachycardia, diastolic HF  Chronic diastolic heart failure grade 1, NL LVF echo Aug 2013  Anemia  Plan:  No Fast AFib since the 19th.  Episodes are short in duration.  In NSR now.    She has a UTI.  May explain increased WBC.  Culture pending.  Starting Macrobid.  Will call [facility] regarding the start of EPO. K+ 5.5.   INR still elevated.     LOS: 11 days    HAGER, BRYAN 08/04/2012 10:10 AM  I spoke to Dr. Caryn Section with Renal.  He instructed to give Aranesp x1 [place]. Crystal at [phone] will arrange follow-up injections.  HAGER, BRYAN 11:17 AM  I have seen and examined the patient along with Wilburt Finlay, PA.  I have reviewed the chart, notes and new data.  I agree with PA's note.  Arrhythmia well controlled. Hemoglobin stable and no overt bleeding despite mildly supratherapeutic  INR. Epo will help. Note continued borderline hyperkalemia, despite no RAAS inhibitors or K supplements. Renal function is worse   PLAN: Resume warfarin when INR under 3. Antibiotics as prescribed. Culture pending. Plan DC to [facility].  Thurmon Fair, MD, [facility] [phone] 08/04/2012, 12:05 PM

## 2012-08-04 NOTE — Progress Notes (Signed)
Clinical Social Worker left voicemail message indicating that [facility] has offered a bed.  CSW encouraged son to return phone call to continue dc planning conversation.  CSW to continue to follow and assist as needed.   Angelia Mould, MSW, [place] [phone]

## 2012-08-04 NOTE — Progress Notes (Signed)
Clinical Social Worker attempted to phone pt's son, Florinda Marker.  CSW left message, CSW to continue to follow and assist as needed.   Angelia Mould, MSW, [place] [phone]

## 2012-08-04 NOTE — Progress Notes (Signed)
Spoke with Wilburt Finlay, PA about pts rising creatinine, was told to hold pts second dose of PO lasix

## 2012-08-05 LAB — URINALYSIS, ROUTINE W REFLEX MICROSCOPIC
Glucose, UA: NEGATIVE mg/dL
Nitrite: NEGATIVE
Specific Gravity, Urine: 1.019 (ref 1.005–1.030)
pH: 8.5 — ABNORMAL HIGH (ref 5.0–8.0)

## 2012-08-05 LAB — PRO B NATRIURETIC PEPTIDE: Pro B Natriuretic peptide (BNP): 1433 pg/mL — ABNORMAL HIGH (ref 0–450)

## 2012-08-05 LAB — BASIC METABOLIC PANEL
CO2: 32 mEq/L (ref 19–32)
Calcium: 8.9 mg/dL (ref 8.4–10.5)
Chloride: 87 mEq/L — ABNORMAL LOW (ref 96–112)
Glucose, Bld: 96 mg/dL (ref 70–99)
Sodium: 129 mEq/L — ABNORMAL LOW (ref 135–145)

## 2012-08-05 LAB — CBC
Hemoglobin: 8 g/dL — ABNORMAL LOW (ref 12.0–15.0)
MCH: 29.4 pg (ref 26.0–34.0)
Platelets: 536 10*3/uL — ABNORMAL HIGH (ref 150–400)
RBC: 2.72 MIL/uL — ABNORMAL LOW (ref 3.87–5.11)
WBC: 21.5 10*3/uL — ABNORMAL HIGH (ref 4.0–10.5)

## 2012-08-05 LAB — URINE MICROSCOPIC-ADD ON

## 2012-08-05 LAB — HEMOGLOBIN AND HEMATOCRIT, BLOOD
HCT: 26.2 % — ABNORMAL LOW (ref 36.0–46.0)
Hemoglobin: 8.4 g/dL — ABNORMAL LOW (ref 12.0–15.0)

## 2012-08-05 LAB — PROTIME-INR: Prothrombin Time: 37.8 seconds — ABNORMAL HIGH (ref 11.6–15.2)

## 2012-08-05 NOTE — Progress Notes (Signed)
Subjective: No specific complaints, no chest pain, no SOB, does have hematuria in cath tubing   Objective: Vital signs in last 24 hours: Temp:  [98.2 F (36.8 C)-98.4 F (36.9 C)] 98.4 F (36.9 C) (10/22 0548) Pulse Rate:  [78-87] 86  (10/22 1003) Resp:  [16] 16  (10/22 0548) BP: (93-111)/(49-68) 108/68 mmHg (10/22 1008) SpO2:  [100 %] 100 % (10/22 0548) Weight:  [111.902 kg (246 lb 11.2 oz)] 111.902 kg (246 lb 11.2 oz) (10/22 0548) Weight change:  Last BM Date: 08/04/12 Intake/Output from previous day: -1000   Wt 111.9  Admit wt 107.6 10/21 0701 - 10/22 0700 In: -  Out: 1000 [Urine:1000] Intake/Output this shift:    PE: General:alert and oriented Heart:S1S2 RRR currently SR Lungs:clear ant without rales, rhonchi or wheezes Abd:+ BS, soft non tender  Ext:no pitting edema, large legs Neuro:alert and oriented, follows comands   Lab Results:  Basename 08/05/12 0621 08/04/12 0729  WBC 21.5* 21.7*  HGB 8.0* 8.5*  HCT 25.5* 26.5*  PLT 536* 513*   BMET  Basename 08/05/12 0621 08/04/12 0729  NA 129* 130*  K 5.3* 5.5*  CL 87* 88*  CO2 32 34*  GLUCOSE 96 85  BUN 55* 50*  CREATININE 3.21* 3.03*  CALCIUM 8.9 9.2   No results found for this basename: TROPONINI:2,CK,MB:2 in the last 72 hours  Lab Results  Component Value Date   CHOL 139 07/25/2012   HDL 22* 07/25/2012   LDLCALC 97 07/25/2012   TRIG 102 07/25/2012   CHOLHDL 6.3 07/25/2012   Lab Results  Component Value Date   HGBA1C 4.8 07/24/2012     Lab Results  Component Value Date   TSH 2.628 07/24/2012      EKG: Orders placed during the hospital encounter of 07/24/12  . EKG 12-LEAD  . EKG 12-LEAD  . EKG 12-LEAD  . EKG 12-LEAD  . EKG 12-LEAD  . EKG 12-LEAD  . EKG 12-LEAD  . EKG  . EKG 12-LEAD  . EKG 12-LEAD    Studies/Results: Dg Chest Port 1 View  08/03/2012  *RADIOLOGY REPORT*  Clinical Data: Elevated white blood cell count.  Dyspnea.  PORTABLE CHEST - 1 VIEW  Comparison: Chest 07/25/2012.   Findings: There are bilateral pleural effusions and basilar airspace disease.  Marked enlargement of the cardiopericardial silhouette without pulmonary edema is identified.  There is no pneumothorax.  IMPRESSION:  1.  Left worse than right pleural effusions and basilar airspace disease. 2.  Cardiomegaly without edema.   Original Report Authenticated By: Bernadene Bell. Maricela Curet, M.D.     Medications: I have reviewed the patient's current medications.    [name] amiodarone  400 mg Oral BID  . aspirin EC  81 mg Oral Daily  . cholecalciferol  2,000 Units Oral Daily  . ciprofloxacin  500 mg Oral Daily  . darbepoetin (ARANESP) injection - NON-DIALYSIS  150 mcg Subcutaneous Once  . diltiazem  120 mg Oral Daily  . docusate sodium  200 mg Oral BID  . ezetimibe  10 mg Oral Daily  . feeding supplement  237 mL Oral BID BM  . furosemide  80 mg Oral BID  . loratadine  10 mg Oral Daily  . metoprolol succinate  25 mg Oral Daily  . pantoprazole  40 mg Oral Q1200  . polyethylene glycol  17 g Oral BID  . senna  1 tablet Oral Daily  . zolpidem  5 mg Oral QHS  . DISCONTD: nitrofurantoin (macrocrystal-monohydrate)  100 mg Oral  Q12H   Assessment/Plan: Principal Problem:  *Sustained SVT, with need for emergent DCCV 07/24/12 Active Problems:  PAF, recurrent 07/24/12- Amiodarone added- NSR  CKD (chronic kidney disease) stage 4, GFR 15-29 ml/min  Aortic stenosis, moderate; Mean gradient of by cath  H/O: GI bleed, severe (re-challenge with Coumadin this admission secondary to recuurent PE with IVCF)  Pulmonary embolism, Oct 2012, suspected recurrence 07/24/12  Hypertension  GERD (gastroesophageal reflux disease)  Morbid obesity  Acute on chronic diastolic congestive heart failure -- secondary to rapid SVT, aFib RVR  Lymphedema  Type II or unspecified type diabetes mellitus without mention of complication, "Diet" controlled  Presence of IVC filter  CAD (coronary artery disease) - Mild-moderate with ~30% RCA  lesion by cath  SOB (shortness of breath)  Hypotensive episode, secondary to SVT with HR 200  Respiratory failure, acute, secondary to tachycardia, diastolic HF  Chronic diastolic heart failure grade 1, NL LVF echo Aug 2013  Anemia  PLAN: Cr elevated to 3.21  Baseline is 2.50-2.6--  Lasix held HGB now to 8.0  Down from 8.5 with INR of 4.18 coumadin held from the the 19th.  ? Vit k to slow rise of INR?  aranesp injection given yesterday and for every 2 weeks, crystal at Dr. Heloise Beecham office to arrange.  CKD stage 4   Amiodarone added this admit.  Has had PAF on the 19th, 20th and 21st, rates up to 134. - but short lived.  On cipro for UTI ? Rise in INR from yesterday-  ? Change to rocephin for a day or so then keflex?  Culture with multiple bacteria. + Hematuria, d/c foley.   Unfortunately, UCx not helpful & resampling may not be helpful either.  ? Transfuse today?  MD to see and address above issues    LOS: 12 days   INGOLD,LAURA R 08/05/2012, 10:15 AM  I have seen & examined the patient this AM along with Nada Boozer.  I agree with her findings, exam & assessment/recommendations.  Afib seems to be relatively controlled -- HR is likely to rise in setting of anemia.   If BP remains stable, would increase BB dose.  Plan should be to decrease Amiodarone to 200 mg bid for d/c  Remains on CCB at low dose I suspect INR increase is due to Warfarin + Amiodarone (? If dosing was adjusted) & now Abx.  Would not want to use Vit K b/c would have to re-start A/C process all over. Agree with d/c Foley.  WBC does appear to be dropping with Abx for likely UTI, but unfortunately no speciation available.  Could re-send new sample,but would probably be unhelpful as she has been on Abx.    May need to think of other causes for high WBC -- remains afebrile & no sign of C-diff  I suspect her BNP is at a new baseline for her since it remains elevated despite continued diuresis.  Seems to have  plateau'd.  Recheck H/H this PM - if continues to drop, agree with re-transfusion.    Will definitely need at least short term SNF on d/c - but not ready until INR stable & H/H stable.  Marykay Lex, M.D., M.S. [facility] [address] [place], [place]  [address]  [phone] Pager # [phone] 08/05/2012 11:41 AM

## 2012-08-05 NOTE — Progress Notes (Signed)
Spoke with Nada Boozer, PA about giving pts cirpo and pts increasing INR; was told to not give, will continue to monitor

## 2012-08-05 NOTE — Progress Notes (Signed)
pts creatinine up to 3.21; per Judie Grieve Hager,PA told to hold lasix; will continue to monitor

## 2012-08-05 NOTE — Progress Notes (Signed)
Physical Therapy Treatment Patient Details Name: IVAN MASKELL MRN: [id number] DOB: [birth year] Today's Date: 08/05/2012 Time: 4098-1191 PT Time Calculation (min): 32 min  PT Assessment / Plan / Recommendation Comments on Treatment Session  pt presents with Sustained SVT.  pt with decreased Hgb to 8.0 today and c/o feeling dizzy while sitting EOB.  BP checked to be 132/85.  Attempted standing, however dizziness increased during standing.  pt returned to bed and RN made aware.      Follow Up Recommendations  Home health PT;Supervision/Assistance - 24 hour     Does the patient have the potential to tolerate intense rehabilitation     Barriers to Discharge        Equipment Recommendations  None recommended by PT    Recommendations for Other Services OT consult  Frequency Min 3X/week   Plan Discharge plan remains appropriate;Frequency remains appropriate    Precautions / Restrictions Precautions Precautions: Fall Restrictions Weight Bearing Restrictions: No   Pertinent Vitals/Pain Denies pain.  Only c/o dizziness in sitting and standing.  BP 132/85.  RN made aware of dizziness.      Mobility  Bed Mobility Bed Mobility: Supine to Sit;Sitting - Scoot to Edge of Bed;Sit to Supine Supine to Sit: 4: Min guard;With rails Sitting - Scoot to Edge of Bed: 3: Mod assist Sit to Supine: 3: Mod assist Details for Bed Mobility Assistance: Used pad to A with hips Transfers Transfers: Sit to Stand;Stand to Sit Sit to Stand: 4: Min assist;With upper extremity assist;From bed Stand to Sit: 4: Min assist;With upper extremity assist;To bed Details for Transfer Assistance: Cues for UE use.   Ambulation/Gait Ambulation/Gait Assistance: Not tested (comment) Stairs: No Wheelchair Mobility Wheelchair Mobility: No    Exercises     PT Diagnosis:    PT Problem List:   PT Treatment Interventions:     PT Goals Acute Rehab PT Goals Time For Goal Achievement: 08/13/12 PT Goal: Supine/Side to  Sit - Progress: Progressing toward goal PT Goal: Sit to Supine/Side - Progress: Progressing toward goal PT Goal: Sit to Stand - Progress: Progressing toward goal PT Goal: Stand to Sit - Progress: Progressing toward goal  Visit Information  Last PT Received On: 08/05/12 Assistance Needed: +1    Subjective Data  Subjective: I'm here.     Cognition  Overall Cognitive Status: Appears within functional limits for tasks assessed/performed Arousal/Alertness: Awake/alert Orientation Level: Appears intact for tasks assessed Behavior During Session: [facility] for tasks performed    Balance  Balance Balance Assessed: No  End of Session PT - End of Session Equipment Utilized During Treatment: Gait belt;Oxygen Activity Tolerance: Patient limited by fatigue Patient left: in bed;with call bell/phone within reach;with nursing in room;with family/visitor present Nurse Communication: Mobility status   GP     [name], [place] [phone] 08/05/2012, 2:04 PM

## 2012-08-05 NOTE — Progress Notes (Signed)
Received a call from [facility] stating that pt appointment has been setup on November 4th at 1pm for Aranesp injection @ [facility] Short Stay. [facility] paged and made aware

## 2012-08-05 NOTE — Progress Notes (Signed)
pts foley catheter removed, pt tolerated well

## 2012-08-05 NOTE — Progress Notes (Signed)
The patient's next Aranesp injection is August 18, 2012 at 1300hrs.  She needs to be brought to the Short Stay department.  This same information will be in the discharge paperwork under "Follow-up".   Christine [name] 3:49 PM

## 2012-08-06 LAB — BASIC METABOLIC PANEL
BUN: 59 mg/dL — ABNORMAL HIGH (ref 6–23)
CO2: 33 mEq/L — ABNORMAL HIGH (ref 19–32)
Calcium: 9 mg/dL (ref 8.4–10.5)
GFR calc non Af Amer: 12 mL/min — ABNORMAL LOW (ref >90)
Glucose, Bld: 93 mg/dL (ref 70–99)
Sodium: 131 mEq/L — ABNORMAL LOW (ref 135–145)

## 2012-08-06 LAB — CBC
HCT: 24.1 % — ABNORMAL LOW (ref 36.0–46.0)
Hemoglobin: 7.7 g/dL — ABNORMAL LOW (ref 12.0–15.0)
MCH: 30.4 pg (ref 26.0–34.0)
MCHC: 32 g/dL (ref 30.0–36.0)
RBC: 2.53 MIL/uL — ABNORMAL LOW (ref 3.87–5.11)

## 2012-08-06 LAB — GLUCOSE, CAPILLARY: Glucose-Capillary: 95 mg/dL (ref 70–99)

## 2012-08-06 LAB — PROTIME-INR: Prothrombin Time: 36.6 seconds — ABNORMAL HIGH (ref 11.6–15.2)

## 2012-08-06 MED ORDER — SODIUM POLYSTYRENE SULFONATE 15 GM/60ML PO SUSP
15.0000 g | Freq: Once | ORAL | Status: AC
  Administered 2012-08-06: 15 g via ORAL
  Filled 2012-08-06: qty 60

## 2012-08-06 MED ORDER — TRAMADOL HCL 50 MG PO TABS
50.0000 mg | ORAL_TABLET | Freq: Four times a day (QID) | ORAL | Status: DC | PRN
  Administered 2012-08-07: 50 mg via ORAL
  Filled 2012-08-06: qty 1

## 2012-08-06 MED ORDER — FUROSEMIDE 10 MG/ML IJ SOLN
40.0000 mg | Freq: Once | INTRAMUSCULAR | Status: AC
  Administered 2012-08-06: 40 mg via INTRAVENOUS
  Filled 2012-08-06: qty 4

## 2012-08-06 MED ORDER — SODIUM CHLORIDE 0.9 % IV BOLUS (SEPSIS)
250.0000 mL | Freq: Once | INTRAVENOUS | Status: AC
  Administered 2012-08-06: 250 mL via INTRAVENOUS

## 2012-08-06 NOTE — Progress Notes (Signed)
Subjective: No complaints, no SOB, no chest pain  Objective: Vital signs in last 24 hours: Temp:  [97.1 F (36.2 C)-98.5 F (36.9 C)] 97.8 F (36.6 C) (10/23 0500) Pulse Rate:  [77-87] 87  (10/23 0811) Resp:  [18] 18  (10/23 0500) BP: (86-108)/(46-79) 108/79 mmHg (10/23 0811) SpO2:  [92 %-100 %] 100 % (10/23 0500) Weight:  [108.455 kg (239 lb 1.6 oz)] 108.455 kg (239 lb 1.6 oz) (10/23 0500) Weight change: -3.447 kg (-7 lb 9.6 oz) Last BM Date: 08/05/12 Intake/Output from previous day: -300 10/22 0701 - 10/23 0700 In: -  Out: 300 [Urine:300] Intake/Output this shift:    PE: General: alert and oriented, up to [facility], up in chair Heart:S1S2, RRR Lungs:clear, no rales, no wheezes ZOX:WRUEA, soft, non tender Ext:now with tr to +1 edema on left,  Tr.on rt.    Lab Results:  Basename 08/06/12 0610 08/05/12 1325 08/05/12 0621  WBC 20.5* -- 21.5*  HGB 7.7* 8.4* --  HCT 24.1* 26.2* --  PLT 604* -- 536*   BMET  Basename 08/06/12 0610 08/05/12 0621  NA 131* 129*  K 5.6* 5.3*  CL 88* 87*  CO2 33* 32  GLUCOSE 93 96  BUN 59* 55*  CREATININE 3.41* 3.21*  CALCIUM 9.0 8.9   No results found for this basename: TROPONINI:2,CK,MB:2 in the last 72 hours  Lab Results  Component Value Date   CHOL 139 07/25/2012   HDL 22* 07/25/2012   LDLCALC 97 07/25/2012   TRIG 102 07/25/2012   CHOLHDL 6.3 07/25/2012   Lab Results  Component Value Date   HGBA1C 4.8 07/24/2012     Lab Results  Component Value Date   TSH 2.628 07/24/2012      EKG: Orders placed during the hospital encounter of 07/24/12  . EKG 12-LEAD  . EKG 12-LEAD  . EKG 12-LEAD  . EKG 12-LEAD  . EKG 12-LEAD  . EKG 12-LEAD  . EKG 12-LEAD  . EKG  . EKG 12-LEAD  . EKG 12-LEAD    Studies/Results: No results found.  Medications: I have reviewed the patient's current medications.    [name] amiodarone  400 mg Oral BID  . aspirin EC  81 mg Oral Daily  . cholecalciferol  2,000 Units Oral Daily  . ciprofloxacin  500  mg Oral Daily  . diltiazem  120 mg Oral Daily  . docusate sodium  200 mg Oral BID  . ezetimibe  10 mg Oral Daily  . feeding supplement  237 mL Oral BID BM  . furosemide  80 mg Oral BID  . loratadine  10 mg Oral Daily  . metoprolol succinate  25 mg Oral Daily  . pantoprazole  40 mg Oral Q1200  . polyethylene glycol  17 g Oral BID  . senna  1 tablet Oral Daily  . zolpidem  5 mg Oral QHS   Assessment/Plan: Principal Problem:  *Sustained SVT, with need for emergent DCCV 07/24/12 Active Problems:  PAF, recurrent 07/24/12- Amiodarone added- NSR  CKD (chronic kidney disease) stage 4, GFR 15-29 ml/min  Aortic stenosis, moderate; Mean gradient of by cath  H/O: GI bleed, severe (re-challenge with Coumadin this admission secondary to recuurent PE with IVCF)  Pulmonary embolism, Oct 2012, suspected recurrence 07/24/12  Hypertension  GERD (gastroesophageal reflux disease)  Morbid obesity  Acute on chronic diastolic congestive heart failure -- secondary to rapid SVT, aFib RVR  Lymphedema  Type II or unspecified type diabetes mellitus without mention of complication, "Diet" controlled  Presence  of IVC filter  CAD (coronary artery disease) - Mild-moderate with ~30% RCA lesion by cath  SOB (shortness of breath)  Hypotensive episode, secondary to SVT with HR 200  Respiratory failure, acute, secondary to tachycardia, diastolic HF  Chronic diastolic heart failure grade 1, NL LVF echo Aug 2013  Anemia  PLAN: Now hypotensive with BP 84 systolic now with H/H decreased 7.7/24 INR 4.0 drifting down now  Cardizem held this am due to hypotension.  Transfuse today.  No PAF today, but had last run on the  21st.  UTI on cipro  Cr. climbing Lasix held yesterday and today except between units of blood.  K+ elevated will give dose of kayexelate   LOS: 13 days   INGOLD,LAURA R 08/06/2012, 10:14 AM   Patient seen and examined. Agree with assessment and plan. No chest pain or SOB. Getting loaded  with amiodarone. No further AF. Cr increased to 3.41.   Lennette Bihari, MD, [facility] 08/06/2012 4:20 PM

## 2012-08-06 NOTE — Progress Notes (Signed)
Clinical Social Worker spoke with Ninetta Lights at [facility] who stated they had only two female beds available and family would need to call to set up a time to sign paperwork.  CSW spoke with pt's son, Florinda Marker, and relayed message.  Florinda Marker stated he would call [facility] after our conversation.  CSW to continue to follow and assist as needed.   Angelia Mould, MSW, [place] [phone]

## 2012-08-06 NOTE — Progress Notes (Addendum)
INITIAL ADULT NUTRITION ASSESSMENT Date: 08/06/2012   Time: 1:59 PM  INTERVENTION:  Continue Ensure Complete supplement 2 times daily between meals (350 kcals, 13 gm protein per 8 fl oz bottle) RD to follow for nutrition care plan  DOCUMENTATION CODES Per approved criteria  -Obesity Unspecified   Reason for Assessment: poor PO intake   ASSESSMENT: Female 78 y.o.  Dx: Sustained SVT  Hx:  Past Medical History  Diagnosis Date  . GERD (gastroesophageal reflux disease)   . Hypertension   . Morbid obesity   . Arthritis   . Chronic kidney disease     nephrolithiasis left kidney,s/p open resection  . Status post arthroscopic surgery of right knee   . Chronic back pain   . Pneumonia 07/2011    hx  . Pulmonary embolism 07/2011    hx  . Uterine cancer     endometrial high grade adenocarcinoma  . Uterus cancer, sarcoma     high grade invasive carcinosarcoma  . Myocardial infarction 1989  . Bronchitis   . H/O cardiomegaly   . Lymphedema 09/29/2011  . Diabetes mellitus, diet controlled 10/03/2011  . Chronic kidney disease, stage II (mild) 09/20/2011  . Acute diastolic congestive heart failure, 2D Oct 2012 09/29/2011  . Presence of IVC filter 01/08/2012  . History of colon polyps 01/08/2012  . Chest pain 01/16/2012  . Aortic stenosis, moderate 01/16/2012  . PAF (paroxysmal atrial fibrillation), 01/18/12 01/18/2012  . CAD (coronary artery disease) 03/14/2012  . Nephrolithiasis 03/14/2012  . Diverticulosis 03/14/2012  . Allergic rhinitis, cause unspecified 05/04/2012  . Gout 06/25/2012  . Radiation 02/03/2009    4500 cGy 25 fx iridium-192/uterine  . Sustained SVT, with need for emergent DCCV 07/24/12 07/24/2012  . SOB (shortness of breath) 07/24/2012  . Hypotensive episode, secondary to SVT with HR 200 07/24/2012  . Respiratory failure, acute, secondary to tachycardia, diastolic HF 07/24/2012    Related Meds:     . amiodarone  400 mg Oral BID  . aspirin EC  81 mg Oral Daily  .  cholecalciferol  2,000 Units Oral Daily  . ciprofloxacin  500 mg Oral Daily  . diltiazem  120 mg Oral Daily  . docusate sodium  200 mg Oral BID  . ezetimibe  10 mg Oral Daily  . feeding supplement  237 mL Oral BID BM  . furosemide  40 mg Intravenous Once  . furosemide  80 mg Oral BID  . loratadine  10 mg Oral Daily  . metoprolol succinate  25 mg Oral Daily  . pantoprazole  40 mg Oral Q1200  . polyethylene glycol  17 g Oral BID  . senna  1 tablet Oral Daily  . sodium chloride  250 mL Intravenous Once  . sodium polystyrene  15 g Oral Once  . zolpidem  5 mg Oral QHS    Ht: 5\' 6"  (167.6 cm)  Wt: 239 lb 1.6 oz (108.455 kg)  Ideal Wt: 59 kg % Ideal Wt: 183%  Usual Wt: 241 lb -- per office visit record 06/25/12 % Usual Wt: 99%  Body mass index is 38.59 kg/(m^2).  Food/Nutrition Related Hx: eating poorly because of a decreased appetite per admission nutrition screen  Labs:  CMP     Component Value Date/Time   NA 131* 08/06/2012 0610   K 5.6* 08/06/2012 0610   CL 88* 08/06/2012 0610   CO2 33* 08/06/2012 0610   GLUCOSE 93 08/06/2012 0610   BUN 59* 08/06/2012 0610   CREATININE 3.41* 08/06/2012 1610  CREATININE 2.50* 06/02/2012 1630   CALCIUM 9.0 08/06/2012 0610   PROT 7.2 07/24/2012 0934   ALBUMIN 2.0* 07/24/2012 0934   AST 21 07/24/2012 0934   ALT 8 07/24/2012 0934   ALKPHOS 79 07/24/2012 0934   BILITOT 0.3 07/24/2012 0934   GFRNONAA 12* 08/06/2012 0610   GFRAA 14* 08/06/2012 0610     Intake/Output Summary (Last 24 hours) at 08/06/12 1400 Last data filed at 08/06/12 1345  Gross per 24 hour  Intake    375 ml  Output      0 ml  Net    375 ml    CBG (last 3)   Basename 08/06/12 1122  GLUCAP 95    Diet Order: Cardiac  Supplements/Tube Feeding: Ensure Complete 2 times daily  IVF:    sodium chloride Last Rate: 10 mL/hr at 07/29/12 1610  sodium chloride Last Rate: 10 mL/hr at 07/25/12 0843    Estimated Nutritional Needs:   Kcal: 1700-1900 Protein: 80-90  gm Fluid: 1.7-1.9 L  Patient admitted with shortness of breath and chest discomfort; states her appetite is not very good; PO intake variable at 30-60% per flowsheet records; has Ensure Complete ordered twice daily -- she is drinking well; patient's weight has fluctuated since hospital admission -- likely due to fluid; noted patient with Stage II coccyx wound.  NUTRITION DIAGNOSIS: -Inadequate oral intake (NI-2.1).  Status: Ongoing  RELATED TO: limited appetite  AS EVIDENCE BY: PO intake 30-60%  MONITORING/EVALUATION(Goals): Goal: Oral intake with meals & supplements to meet >/= 90% of estimated nutrition needs Monitor: PO & supplemental intake, weight, labs, I/O's  EDUCATION NEEDS: -No education needs identified at this time  Dietitian #: [phone]  Alger Memos 08/06/2012, 1:59 PM

## 2012-08-07 DIAGNOSIS — N184 Chronic kidney disease, stage 4 (severe): Secondary | ICD-10-CM | POA: Diagnosis present

## 2012-08-07 DIAGNOSIS — N189 Chronic kidney disease, unspecified: Secondary | ICD-10-CM | POA: Diagnosis present

## 2012-08-07 LAB — BASIC METABOLIC PANEL
CO2: 27 mEq/L (ref 19–32)
CO2: 30 mEq/L (ref 19–32)
Calcium: 9 mg/dL (ref 8.4–10.5)
Chloride: 87 mEq/L — ABNORMAL LOW (ref 96–112)
Chloride: 86 mEq/L — ABNORMAL LOW (ref 96–112)
GFR calc Af Amer: 13 mL/min — ABNORMAL LOW (ref >90)
Potassium: 5.9 mEq/L — ABNORMAL HIGH (ref 3.5–5.1)
Sodium: 130 mEq/L — ABNORMAL LOW (ref 135–145)
Sodium: 131 mEq/L — ABNORMAL LOW (ref 135–145)

## 2012-08-07 LAB — PROTIME-INR: Prothrombin Time: 35 seconds — ABNORMAL HIGH (ref 11.6–15.2)

## 2012-08-07 LAB — CBC
Hemoglobin: 9.8 g/dL — ABNORMAL LOW (ref 12.0–15.0)
Platelets: 588 10*3/uL — ABNORMAL HIGH (ref 150–400)
RBC: 3.26 MIL/uL — ABNORMAL LOW (ref 3.87–5.11)
WBC: 17.2 10*3/uL — ABNORMAL HIGH (ref 4.0–10.5)

## 2012-08-07 LAB — PRO B NATRIURETIC PEPTIDE: Pro B Natriuretic peptide (BNP): 2087 pg/mL — ABNORMAL HIGH (ref 0–450)

## 2012-08-07 MED ORDER — AMIODARONE IV BOLUS ONLY 150 MG/100ML
150.0000 mg | Freq: Once | INTRAVENOUS | Status: AC
  Administered 2012-08-07: 150 mg via INTRAVENOUS
  Filled 2012-08-07: qty 100

## 2012-08-07 MED ORDER — SODIUM POLYSTYRENE SULFONATE 15 GM/60ML PO SUSP
15.0000 g | Freq: Once | ORAL | Status: AC
  Administered 2012-08-07: 15 g via ORAL
  Filled 2012-08-07: qty 60

## 2012-08-07 NOTE — Progress Notes (Signed)
At approx 0520 pt assisted to [employer] and HR went up to 170"s (appears to be AFIB RVR) and pt stated she was dizzy and nauseated.  Pt assisted back to bed.  BP 87/61 with HR 150-160's.  Pt HR continued to sustain 150-160's BP 98/49 pt stated she did feel "a little bit better".  Corine Shelter PA paged and updated on pts history and current events--orders received/entered.  BP rechecked at 0550 97/64 HR 145-160.  Will continue to monitor. Dierdre Highman

## 2012-08-07 NOTE — Progress Notes (Signed)
Amio bolus hung per orders --pt also having runs SVT.  BP 96/54.  Continue to monitor. Dierdre Highman

## 2012-08-07 NOTE — Progress Notes (Signed)
[facility]  Subjective: Feeling better.  She was weak when HR elevated. This AM - short run of rapid Afib - Amio rebolused.  In NSR since. Has only been OOB to chair.  Objective: Vital signs in last 24 hours: Temp:  [97.5 F (36.4 C)-98.6 F (37 C)] 98 F (36.7 C) (10/24 1440) Pulse Rate:  [82-168] 89  (10/24 1440) Resp:  [18] 18  (10/24 1440) BP: (83-133)/(40-81) 90/60 mmHg (10/24 1440) SpO2:  [96 %-100 %] 96 % (10/24 0821) Last BM Date: 08/06/12  Intake/Output from previous day: 10/23 0701 - 10/24 0700 In: 1510 [P.O.:360; I.V.:250; Blood:900] Out: 4 [Urine:3; Stool:1] Intake/Output this shift: Total I/O In: 780 [P.O.:780] Out: 1 [Stool:1]  Medications Current Facility-Administered Medications  Medication Dose Route Frequency Provider Last Rate Last Dose  . 0.9 %  sodium chloride infusion   Intravenous Continuous Nada Boozer, NP 10 mL/hr at 07/29/12 0655    . 0.9 %  sodium chloride infusion   Intravenous Continuous Marykay Lex, MD 10 mL/hr at 07/25/12 [phone]    . acetaminophen (TYLENOL) tablet 650 mg  650 mg Oral Q4H PRN Nada Boozer, NP   650 mg at 08/02/12 2354  . albuterol (PROVENTIL HFA;VENTOLIN HFA) 108 (90 BASE) MCG/ACT inhaler 2 puff  2 puff Inhalation Q6H PRN Nada Boozer, NP   2 puff at 07/29/12 1844  . ALPRAZolam Prudy Feeler) tablet 0.25 mg  0.25 mg Oral Q8H PRN Abelino Derrick, PA   0.25 mg at 08/02/12 1728  . alum & mag hydroxide-simeth (MAALOX/MYLANTA) 200-200-20 MG/5ML suspension 30 mL  30 mL Oral Q4H PRN Nada Boozer, NP   30 mL at 07/24/12 2139  . amiodarone (NEXTERONE) IV bolus only 150 mg/100 mL  150 mg Intravenous Once Abelino Derrick, [place]   150 mg at 08/07/12 0609  . amiodarone (PACERONE) tablet 400 mg  400 mg Oral BID Nada Boozer, NP   400 mg at 08/07/12 1047  . aspirin EC tablet 81 mg  81 mg Oral Daily Nada Boozer, NP   81 mg at 08/07/12 1047  . cholecalciferol (VITAMIN D) tablet 2,000 Units  2,000 Units Oral Daily Nada Boozer, NP   2,000 Units at 08/07/12 1047  . ciprofloxacin (CIPRO) tablet 500 mg  500 mg Oral Daily Wilburt Finlay, PA   500 mg at 08/07/12 1047  . diltiazem (CARDIZEM CD) 24 hr capsule 120 mg  120 mg Oral Daily Eda Paschal [place], [place]   120 mg at 08/07/12 1314  . docusate sodium (COLACE) capsule 200 mg  200 mg Oral BID Nada Boozer, NP   200 mg at 08/06/12 0942  . ezetimibe (ZETIA) tablet 10 mg  10 mg Oral Daily Nada Boozer, NP   10 mg at 08/07/12 1047  . feeding supplement (ENSURE COMPLETE) liquid 237 mL  237 mL Oral BID BM Chrystie Nose, MD   237 mL at 08/07/12 1000  . furosemide (LASIX) tablet 80 mg  80 mg Oral BID Marykay Lex, MD   80 mg at 08/04/12 0942  . HYDROcodone-acetaminophen (NORCO/VICODIN) 5-325 MG per tablet 1-2 tablet  1-2 tablet Oral Q4H PRN Thurmon Fair, MD   1 tablet at 08/07/12 1509  . loratadine (CLARITIN) tablet 10 mg  10 mg Oral Daily Nada Boozer, NP   10 mg at 08/07/12 1047  . metoprolol succinate (TOPROL-XL) 24 hr tablet 25 mg  25 mg Oral Daily Nada Boozer, NP   25 mg at 08/07/12 1104  .  nitroGLYCERIN (NITROSTAT) SL tablet 0.4 mg  0.4 mg Sublingual Q5 Min x 3 PRN Nada Boozer, NP      . ondansetron [facility]) injection 4 mg  4 mg Intravenous Q6H PRN Nada Boozer, NP   4 mg at 08/07/12 1509  . pantoprazole (PROTONIX) EC tablet 40 mg  40 mg Oral Q1200 Nada Boozer, NP   40 mg at 08/07/12 1226  . polyethylene glycol (MIRALAX / GLYCOLAX) packet 17 g  17 g Oral BID Nada Boozer, NP   17 g at 08/06/12 0943  . senna (SENOKOT) tablet 8.6 mg  1 tablet Oral Daily Nada Boozer, NP   8.6 mg at 08/06/12 0942  . sodium polystyrene (KAYEXALATE) 15 GM/60ML suspension 15 g  15 g Oral Once Wilburt Finlay, PA   15 g at 08/07/12 0857  . traMADol (ULTRAM) tablet 50 mg  50 mg Oral Q6H PRN Abelino Derrick, PA      . zolpidem [facility]) tablet 5 mg  5 mg Oral QHS Eda Paschal [place], [place]   5 mg at 08/06/12 2236    PE: General appearance: alert, cooperative and no distress Lungs: clear to auscultation  bilaterally; fine crackles in RML field, non-labored. Heart: regular rate and rhythm, S1, S2 normal, no murmur, click, rub or gallop Extremities: Chronic LEE. ~1-2+; with otherwise large fatty legs. Pulses: Weak radial pulses. Neurologic: Grossly normal  Lab Results:   Basename 08/07/12 0555 08/06/12 0610 08/05/12 1325 08/05/12 0621  WBC 17.2* 20.5* -- 21.5*  HGB 9.8* 7.7* 8.4* --  HCT 29.7* 24.1* 26.2* --  PLT 588* 604* -- 536*   BMET  Basename 08/07/12 0555 08/06/12 0610 08/05/12 0621  NA 130* 131* 129*  K 5.9* 5.6* 5.3*  CL 87* 88* 87*  CO2 27 33* 32  GLUCOSE 114* 93 96  BUN 62* 59* 55*  CREATININE 3.47* 3.41* 3.21*  CALCIUM 9.3 9.0 8.9   PT/INR  Basename 08/07/12 0555 08/06/12 0610 08/05/12 0621  LABPROT 35.0* 36.6* 37.8*  INR 3.77* 4.00* 4.18*    Assessment/Plan  Principal Problem:  *Sustained SVT, with need for emergent DCCV 07/24/12 Active Problems:  Acute on chronic diastolic congestive heart failure -- secondary to rapid SVT, aFib RVR  Chronic renal insufficiency, stage IV (severe)  Acute on chronic renal failure  Aortic stenosis, moderate; Mean gradient of by cath  Chronic diastolic heart failure grade 1, NL LVF echo Aug 2013  CAD (coronary artery disease) - Mild-moderate with ~30% RCA lesion by cath  Pulmonary embolism, Oct 2012, suspected recurrence 07/24/12  CKD (chronic kidney disease) stage 4, GFR 15-29 ml/min  Hypertension  GERD (gastroesophageal reflux disease)  Morbid obesity  Lymphedema  Type II or unspecified type diabetes mellitus without mention of complication, "Diet" controlled  Presence of IVC filter  PAF, recurrent 07/24/12- Amiodarone added- NSR  H/O: GI bleed, severe (re-challenge with Coumadin this admission secondary to recuurent PE with IVCF)  SOB (shortness of breath)  Hypotensive episode, secondary to SVT with HR 200  Respiratory failure, acute, secondary to tachycardia, diastolic HF  Anemia  Plan:  Rapid AFib earlier.   Now NSR.   Amio  Bolus ordered at 0600hrs.  Hypotensive.  S/P PRBCs, Hgb improved to 9.8.  Potassium still elevated.  Will repeat kayexalate.   Holding lasix again.   CSW working on SNF.  She needs better af control prior to leaving.    LOS: 14 days    HARDING,DAVID W 08/07/2012 5:38 PM  I have seen & examined  the patient this PM after Mr. Leron Croak.  I agree with his findings, exam, impression & plan.   No further Afib RVR since this AM - continuing Amiodarone load, but suspect that she will have intermittent episodes. - asymptomatic.  Hyperkalemic again today - had large BM after kayexalate, will recheck labs this PM.  Cr ~stable @ 3.47 from 3.41 -- Lasix being held (need closer I/O monitoring -- not measured); with significant h/o CKD & anemia, would be prudent to notify / consult Dr. Darrick Penna (her Nephrologist): ? [facility], & for assistance with determining +/- continuing Lasix.  No benefit noted from 2 U PRBC  H/H with appropriate "bump post transfusion" -- ? GI felt that anemia was due to CKD, & no "source" identified for bleed; large BM - no blood, no hematuria or vaginal bleeding.  Needs PT/OT to assist with deconditioning & with D/C planning.  BP borderline hypotensive - is on BB & CCB for rate control -- may need to hold PM dose vs alternate AM & PM dosing.  Will need to see sequential "stable" days with H/H & renal function stable with no arrythmia to feel that she would be OK for d/c.  Likely will need short term SNF on d/c.  Marykay Lex, M.D., M.S. [facility] [address] [place], [place]  [address]  [phone] Pager # [phone] 08/07/2012 5:49 PM

## 2012-08-07 NOTE — Progress Notes (Signed)
Physical Therapy Treatment Patient Details Name: Christine [name] MRN: [id number] DOB: [birth year] Today's Date: 08/07/2012 Time: 4098-1191 PT Time Calculation (min): 10 min  PT Assessment / Plan / Recommendation Comments on Treatment Session  pt rpesents with sustained SVT.  pt c/o feeling nauseated and overall not feeling well.  Repositioned pt in bed with A from Nsg Tech.      Follow Up Recommendations  Home health PT;Supervision/Assistance - 24 hour     Does the patient have the potential to tolerate intense rehabilitation     Barriers to Discharge        Equipment Recommendations  None recommended by PT    Recommendations for Other Services OT consult  Frequency Min 3X/week   Plan Discharge plan remains appropriate;Frequency remains appropriate    Precautions / Restrictions Precautions Precautions: Fall Restrictions Weight Bearing Restrictions: No   Pertinent Vitals/Pain Indicates not painful, but nauseated.  Nsg Tech aware and to notify RN.      Mobility  Bed Mobility Bed Mobility: Rolling Right;Scooting to HOB Rolling Right: 3: Mod assist;With rail Scooting to HOB: 1: +2 Total assist Scooting to [facility]: Patient Percentage: 0% Details for Bed Mobility Assistance: pt very weak and nauseated today.  Needed increased A to roll in bed and scoot to [facility]   Transfers Transfers: Not assessed Ambulation/Gait Ambulation/Gait Assistance: Not tested (comment) Stairs: No Wheelchair Mobility Wheelchair Mobility: No    Exercises     PT Diagnosis:    PT Problem List:   PT Treatment Interventions:     PT Goals Acute Rehab PT Goals Time For Goal Achievement: 08/13/12 PT Goal: Supine/Side to Sit - Progress: Not progressing PT Goal: Sit to Supine/Side - Progress: Not progressing  Visit Information  Last PT Received On: 08/07/12 Assistance Needed: +1    Subjective Data  Subjective: I just don't feel well.     Cognition  Overall Cognitive Status: Appears within  functional limits for tasks assessed/performed Arousal/Alertness: Awake/alert Orientation Level: Appears intact for tasks assessed Behavior During Session: [facility] for tasks performed    Balance  Balance Balance Assessed: No  End of Session PT - End of Session Equipment Utilized During Treatment: Oxygen Activity Tolerance: Patient limited by fatigue Patient left: in bed;with call bell/phone within reach;with nursing in room;with family/visitor present Nurse Communication: Mobility status   GP     [name], [place] [phone] 08/07/2012, 3:04 PM

## 2012-08-08 LAB — BASIC METABOLIC PANEL
CO2: 32 mEq/L (ref 19–32)
Calcium: 9.2 mg/dL (ref 8.4–10.5)
Potassium: 4.7 mEq/L (ref 3.5–5.1)
Sodium: 131 mEq/L — ABNORMAL LOW (ref 135–145)

## 2012-08-08 LAB — CBC
MCH: 29.1 pg (ref 26.0–34.0)
Platelets: 548 10*3/uL — ABNORMAL HIGH (ref 150–400)
RBC: 3.06 MIL/uL — ABNORMAL LOW (ref 3.87–5.11)
WBC: 19.7 10*3/uL — ABNORMAL HIGH (ref 4.0–10.5)

## 2012-08-08 LAB — PROTIME-INR
INR: 3.78 — ABNORMAL HIGH (ref 0.00–1.49)
Prothrombin Time: 35.1 seconds — ABNORMAL HIGH (ref 11.6–15.2)

## 2012-08-08 LAB — PRO B NATRIURETIC PEPTIDE: Pro B Natriuretic peptide (BNP): 3001 pg/mL — ABNORMAL HIGH (ref 0–450)

## 2012-08-08 MED ORDER — AMIODARONE IV BOLUS ONLY 150 MG/100ML
150.0000 mg | INTRAVENOUS | Status: DC
  Filled 2012-08-08: qty 100

## 2012-08-08 MED ORDER — DILTIAZEM HCL ER 60 MG PO CP12
60.0000 mg | ORAL_CAPSULE | Freq: Once | ORAL | Status: AC
  Administered 2012-08-08: 60 mg via ORAL
  Filled 2012-08-08: qty 1

## 2012-08-08 MED ORDER — DILTIAZEM HCL ER COATED BEADS 180 MG PO CP24
180.0000 mg | ORAL_CAPSULE | Freq: Every day | ORAL | Status: DC
  Administered 2012-08-09: 180 mg via ORAL
  Filled 2012-08-08: qty 1

## 2012-08-08 NOTE — Progress Notes (Signed)
Pt flipping in and out of A.fib with HR increasing to 140's.  EKG obtained.  Pt is asymptomatic and VS are stable.  MD notified.  No new orders received.  Will continue to monitor.

## 2012-08-08 NOTE — Progress Notes (Signed)
[facility]  Subjective: Nauseated and dizzy.  She had just got up to the bed side commode   Objective: Vital signs in last 24 hours: Temp:  [97.6 F (36.4 C)-98 F (36.7 C)] 97.9 F (36.6 C) (10/25 0438) Pulse Rate:  [59-97] 59  (10/25 1050) Resp:  [17-18] 17  (10/25 0438) BP: (90-112)/(44-74) 112/74 mmHg (10/25 1039) SpO2:  [100 %] 100 % (10/25 0438) Weight:  [110.406 kg (243 lb 6.4 oz)] 110.406 kg (243 lb 6.4 oz) (10/25 0438) Last BM Date: 08/07/12  Intake/Output from previous day: 10/24 0701 - 10/25 0700 In: 1120 [P.O.:1120] Out: 101 [Urine:100; Stool:1] Intake/Output this shift:    Medications Current Facility-Administered Medications  Medication Dose Route Frequency Provider Last Rate Last Dose  . 0.9 %  sodium chloride infusion   Intravenous Continuous Nada Boozer, NP 10 mL/hr at 07/29/12 0655    . 0.9 %  sodium chloride infusion   Intravenous Continuous Marykay Lex, MD 10 mL/hr at 07/25/12 [phone]    . acetaminophen (TYLENOL) tablet 650 mg  650 mg Oral Q4H PRN Nada Boozer, NP   650 mg at 08/02/12 2354  . albuterol (PROVENTIL HFA;VENTOLIN HFA) 108 (90 BASE) MCG/ACT inhaler 2 puff  2 puff Inhalation Q6H PRN Nada Boozer, NP   2 puff at 07/29/12 1844  . ALPRAZolam Prudy Feeler) tablet 0.25 mg  0.25 mg Oral Q8H PRN Abelino Derrick, PA   0.25 mg at 08/02/12 1728  . alum & mag hydroxide-simeth (MAALOX/MYLANTA) 200-200-20 MG/5ML suspension 30 mL  30 mL Oral Q4H PRN Nada Boozer, NP   30 mL at 07/24/12 2139  . amiodarone (PACERONE) tablet 400 mg  400 mg Oral BID Nada Boozer, NP   400 mg at 08/08/12 1039  . aspirin EC tablet 81 mg  81 mg Oral Daily Nada Boozer, NP   81 mg at 08/08/12 1039  . cholecalciferol (VITAMIN D) tablet 2,000 Units  2,000 Units Oral Daily Nada Boozer, NP   2,000 Units at 08/08/12 1039  . ciprofloxacin (CIPRO) tablet 500 mg  500 mg Oral Daily Wilburt Finlay, PA   500 mg at 08/08/12 1039  . diltiazem (CARDIZEM CD) 24 hr capsule 120  mg  120 mg Oral Daily Eda Paschal [place], [place]   120 mg at 08/08/12 1039  . docusate sodium (COLACE) capsule 200 mg  200 mg Oral BID Nada Boozer, NP   200 mg at 08/08/12 1039  . ezetimibe (ZETIA) tablet 10 mg  10 mg Oral Daily Nada Boozer, NP   10 mg at 08/08/12 1039  . feeding supplement (ENSURE COMPLETE) liquid 237 mL  237 mL Oral BID BM Chrystie Nose, MD   237 mL at 08/08/12 1038  . furosemide (LASIX) tablet 80 mg  80 mg Oral BID Marykay Lex, MD   80 mg at 08/08/12 9323  . HYDROcodone-acetaminophen (NORCO/VICODIN) 5-325 MG per tablet 1-2 tablet  1-2 tablet Oral Q4H PRN Thurmon Fair, MD   2 tablet at 08/08/12 1051  . loratadine (CLARITIN) tablet 10 mg  10 mg Oral Daily Nada Boozer, NP   10 mg at 08/08/12 1039  . metoprolol succinate (TOPROL-XL) 24 hr tablet 25 mg  25 mg Oral Daily Nada Boozer, NP   25 mg at 08/08/12 1050  . nitroGLYCERIN (NITROSTAT) SL tablet 0.4 mg  0.4 mg Sublingual Q5 Min x 3 PRN Nada Boozer, NP      . ondansetron [facility]) injection 4 mg  4 mg Intravenous Q6H  PRN Nada Boozer, NP   4 mg at 08/07/12 1509  . pantoprazole (PROTONIX) EC tablet 40 mg  40 mg Oral Q1200 Nada Boozer, NP   40 mg at 08/07/12 1226  . polyethylene glycol (MIRALAX / GLYCOLAX) packet 17 g  17 g Oral BID Nada Boozer, NP   17 g at 08/08/12 1039  . senna (SENOKOT) tablet 8.6 mg  1 tablet Oral Daily Nada Boozer, NP   8.6 mg at 08/08/12 1038  . traMADol (ULTRAM) tablet 50 mg  50 mg Oral Q6H PRN Abelino Derrick, PA   50 mg at 08/07/12 1843  . zolpidem (AMBIEN) tablet 5 mg  5 mg Oral QHS Eda Paschal [place], [place]   5 mg at 08/07/12 2120    PE: General appearance: alert, cooperative and moderate distress Lungs: Clear anteriorly Heart: irregularly irregular rhythm and Rate very fast Pulses: radials 1+ and symmetric Neurologic: Grossly normal  Lab Results:   Basename 08/08/12 0625 08/07/12 0555 08/06/12 0610  WBC 19.7* 17.2* 20.5*  HGB 8.9* 9.8* 7.7*  HCT 28.1* 29.7* 24.1*  PLT 548* 588* 604*    BMET  Basename 08/08/12 0625 08/07/12 1933 08/07/12 0555  NA 131* 131* 130*  K 4.7 4.5 5.9*  CL 86* 86* 87*  CO2 32 30 27  GLUCOSE 98 136* 114*  BUN 66* 65* 62*  CREATININE 3.80* 3.56* 3.47*  CALCIUM 9.2 9.0 9.3   PT/INR  Basename 08/08/12 0625 08/07/12 0555 08/06/12 0610  LABPROT 35.1* 35.0* 36.6*  INR 3.78* 3.77* 4.00*   Cholesterol No results found for this basename: CHOL in the last 72 hours Cardiac Enzymes No components found with this basename: TROPONIN:3, CKMB:3  Studies/Results: @RISRSLT2 @   Assessment/Plan  Principal Problem:  *Sustained SVT, with need for emergent DCCV 07/24/12 Active Problems:  Pulmonary embolism, Oct 2012, suspected recurrence 07/24/12  CKD (chronic kidney disease) stage 4, GFR 15-29 ml/min  Hypertension  GERD (gastroesophageal reflux disease)  Morbid obesity  Acute on chronic diastolic congestive heart failure -- secondary to rapid SVT, aFib RVR  Lymphedema  Type II or unspecified type diabetes mellitus without mention of complication, "Diet" controlled  Presence of IVC filter  Aortic stenosis, moderate; Mean gradient of by cath  PAF, recurrent 07/24/12- Amiodarone added- NSR  H/O: GI bleed, severe (re-challenge with Coumadin this admission secondary to recuurent PE with IVCF)  CAD (coronary artery disease) - Mild-moderate with ~30% RCA lesion by cath  SOB (shortness of breath)  Hypotensive episode, secondary to SVT with HR 200  Respiratory failure, acute, secondary to tachycardia, diastolic HF  Chronic diastolic heart failure grade 1, NL LVF echo Aug 2013  Anemia  Chronic renal insufficiency, stage IV (severe)  Acute on chronic renal failure  Plan:   1.  In Afib RVR to the 170's right now.  Giving Amio 150mg  bolus now.  BP is stable.  Increasing cardizem to 180mg  2.  Per RN urine is still bloody.  INR is elevated and Hgb has decreased from 9.8 to 8.9.  She has not received any      anticoagulation for several days.  INR  still supratherapeutic.     3.  Aranesp, , was given on 10/21.  Next injection is 11/4.   3.  WBCs elevated further.  On cipro for UTI.  May be affecting INR.   LOS: 15 days    HAGER, BRYAN 08/08/2012 11:21 AM   She is back to NSR at 96BPM.  Probably triggered when getting up.  Did not get the Amio bolus.   11:41 AM   I have seen and examined the patient along with Wilburt Finlay, PA.  I have reviewed the chart, notes and new data.  I agree with PA's note.  Still plagued by episodes of very rapid ventricular rate, usually when moving around. The episodes are fortunately very brief.  No overt bleeding, but still requires blood products. INR remains >3, likely due to Cipro and amiodarone interaction with warfarin. Will be here at least through the weekend.   Thurmon Fair, MD, [facility] [phone] 08/08/2012, 4:28 PM

## 2012-08-08 NOTE — Progress Notes (Signed)
Clinical Social Worker met with pt and pt's "caretaker".  CSW introduced self, explained role, and provided support.  CSW reviewed SNF options-pt agreeable and would prefer [facility] if possible.  CSW encouraged pt and caretaker to ask Florinda Marker to phone CSW to discuss dc options.  CSW to continue to follow and assist as needed.   Angelia Mould, MSW, [place] [phone]

## 2012-08-08 NOTE — Progress Notes (Signed)
Physical Therapy Treatment Patient Details Name: Christine [name] MRN: [id number] DOB: 1931-04-30 Today's Date: 08/08/2012 Time: 5621-3086 PT Time Calculation (min): 25 min  PT Assessment / Plan / Recommendation Comments on Treatment Session  Improved from last session, however patient remains limited by dizziness and feeling nauseated.  Patient's HR did go to 180 (Afib) x 5 seconds per monitor secretary, then back to SR with HR 105.      Follow Up Recommendations  Post acute inpatient (SNF)     Does the patient have the potential to tolerate intense rehabilitation  No, Recommend SNF  Barriers to Discharge        Equipment Recommendations  None recommended by PT    Recommendations for Other Services OT consult  Frequency Min 3X/week   Plan Discharge plan needs to be updated;Frequency remains appropriate    Precautions / Restrictions Precautions Precautions: Fall Restrictions Weight Bearing Restrictions: No   Pertinent Vitals/Pain     Mobility  Bed Mobility Bed Mobility: Supine to Sit;Sitting - Scoot to [place] of Bed;Sit to Supine Supine to Sit: 4: Min assist;With rails;HOB flat Sitting - Scoot to [place] of Bed: 4: Min assist Sit to Supine: 3: Mod assist;HOB flat Details for Bed Mobility Assistance: Assist to raise trunk off bed to sit, and to bring LE's onto bed when returning to supine. Transfers Transfers: Sit to Stand;Stand to Sit Sit to Stand: 4: Min assist;With upper extremity assist;From bed (+1 for safety) Stand to Sit: 4: Min assist;With upper extremity assist;To bed Details for Transfer Assistance: Cues for hand placement Ambulation/Gait Ambulation/Gait Assistance: 4: Min assist (+1 for safety) Ambulation Distance (Feet): 48 Feet (with one sitting rest break) Assistive device: Rolling walker Ambulation/Gait Assistance Details: Verbal cues to stand upright and keep RW close to her body.  Patient with very flexed posture in standing.  Gait limited by patient feeling  dizzy and nauseated. Gait Pattern: Step-through pattern;Decreased stride length;Trunk flexed Gait velocity: Slow gait speed Stairs: No      PT Goals Acute Rehab PT Goals PT Goal: Supine/Side to Sit - Progress: Progressing toward goal PT Goal: Sit to Supine/Side - Progress: Progressing toward goal PT Goal: Sit to Stand - Progress: Progressing toward goal PT Goal: Stand to Sit - Progress: Progressing toward goal PT Goal: Ambulate - Progress: Progressing toward goal  Visit Information  Last PT Received On: 08/08/12 Assistance Needed: +1    Subjective Data  Subjective: "I feel nauseated and dizzy"   Cognition  Overall Cognitive Status: Appears within functional limits for tasks assessed/performed Arousal/Alertness: Awake/alert Orientation Level: Appears intact for tasks assessed Behavior During Session: [facility] for tasks performed    Balance     End of Session PT - End of Session Equipment Utilized During Treatment: Gait belt;Oxygen Activity Tolerance: Patient limited by fatigue Patient left: in bed;with call bell/phone within reach;with family/visitor present Nurse Communication: Mobility status   GP     Vena Austria 08/08/2012, 3:40 PM Durenda Hurt. Renaldo Fiddler, [facility] Acute Rehab Services Pager [phone]

## 2012-08-09 ENCOUNTER — Inpatient Hospital Stay (HOSPITAL_COMMUNITY): Payer: Medicare Other

## 2012-08-09 LAB — BASIC METABOLIC PANEL
CO2: 33 mEq/L — ABNORMAL HIGH (ref 19–32)
Calcium: 8.7 mg/dL (ref 8.4–10.5)
GFR calc Af Amer: 12 mL/min — ABNORMAL LOW (ref >90)
GFR calc non Af Amer: 10 mL/min — ABNORMAL LOW (ref >90)
Sodium: 129 mEq/L — ABNORMAL LOW (ref 135–145)

## 2012-08-09 LAB — CBC
MCH: 30.2 pg (ref 26.0–34.0)
Platelets: 511 10*3/uL — ABNORMAL HIGH (ref 150–400)
RBC: 2.55 MIL/uL — ABNORMAL LOW (ref 3.87–5.11)
RDW: 16 % — ABNORMAL HIGH (ref 11.5–15.5)
WBC: 17.3 10*3/uL — ABNORMAL HIGH (ref 4.0–10.5)

## 2012-08-09 LAB — PROTIME-INR: Prothrombin Time: 35.2 seconds — ABNORMAL HIGH (ref 11.6–15.2)

## 2012-08-09 LAB — PREPARE RBC (CROSSMATCH)

## 2012-08-09 LAB — PRO B NATRIURETIC PEPTIDE: Pro B Natriuretic peptide (BNP): 3341 pg/mL — ABNORMAL HIGH (ref 0–450)

## 2012-08-09 MED ORDER — FUROSEMIDE 10 MG/ML IJ SOLN
20.0000 mg | Freq: Once | INTRAMUSCULAR | Status: AC
  Administered 2012-08-09: 20 mg via INTRAVENOUS

## 2012-08-09 MED ORDER — PHYTONADIONE 5 MG PO TABS
5.0000 mg | ORAL_TABLET | Freq: Once | ORAL | Status: AC
  Administered 2012-08-09: 5 mg via ORAL
  Filled 2012-08-09: qty 1

## 2012-08-09 MED ORDER — FUROSEMIDE 10 MG/ML IJ SOLN
INTRAMUSCULAR | Status: AC
  Filled 2012-08-09: qty 4

## 2012-08-09 MED ORDER — DARBEPOETIN ALFA-POLYSORBATE 100 MCG/0.5ML IJ SOLN
100.0000 ug | INTRAMUSCULAR | Status: DC
  Administered 2012-08-09: 100 ug via SUBCUTANEOUS
  Filled 2012-08-09: qty 0.5

## 2012-08-09 MED ORDER — VITAMIN K1 10 MG/ML IJ SOLN
2.5000 mg | Freq: Once | INTRAVENOUS | Status: AC
  Administered 2012-08-09: 2.5 mg via INTRAVENOUS
  Filled 2012-08-09: qty 0.25

## 2012-08-09 NOTE — Progress Notes (Signed)
[facility]  Subjective: Feels about the same.  No main complaints - mild hematuria.   Objective: Vital signs in last 24 hours: Temp:  [97.6 F (36.4 C)-98 F (36.7 C)] 97.6 F (36.4 C) (10/26 0600) Pulse Rate:  [59-110] 79  (10/26 0814) Resp:  [14-18] 14  (10/26 0600) BP: (84-97)/(47-63) 94/63 mmHg (10/26 1034) SpO2:  [10 %-100 %] 100 % (10/26 0600) Weight:  [112.991 kg (249 lb 1.6 oz)] 112.991 kg (249 lb 1.6 oz) (10/26 0600) Last BM Date: 08/07/12  Intake/Output from previous day: 10/25 0701 - 10/26 0700 In: 240 [P.O.:240] Out: 600 [Urine:600] Intake/Output this shift:    Medications Current Facility-Administered Medications  Medication Dose Route Frequency Provider Last Rate Last Dose  . 0.9 %  sodium chloride infusion   Intravenous Continuous Nada Boozer, NP 10 mL/hr at 07/29/12 0655    . 0.9 %  sodium chloride infusion   Intravenous Continuous Marykay Lex, MD 10 mL/hr at 07/25/12 [phone]    . acetaminophen (TYLENOL) tablet 650 mg  650 mg Oral Q4H PRN Nada Boozer, NP   650 mg at 08/02/12 2354  . albuterol (PROVENTIL HFA;VENTOLIN HFA) 108 (90 BASE) MCG/ACT inhaler 2 puff  2 puff Inhalation Q6H PRN Nada Boozer, NP   2 puff at 07/29/12 1844  . ALPRAZolam Prudy Feeler) tablet 0.25 mg  0.25 mg Oral Q8H PRN Abelino Derrick, PA   0.25 mg at 08/02/12 1728  . alum & mag hydroxide-simeth (MAALOX/MYLANTA) 200-200-20 MG/5ML suspension 30 mL  30 mL Oral Q4H PRN Nada Boozer, NP   30 mL at 07/24/12 2139  . amiodarone (PACERONE) tablet 400 mg  400 mg Oral BID Nada Boozer, NP   400 mg at 08/09/12 1033  . aspirin EC tablet 81 mg  81 mg Oral Daily Nada Boozer, NP   81 mg at 08/09/12 1033  . cholecalciferol (VITAMIN D) tablet 2,000 Units  2,000 Units Oral Daily Nada Boozer, NP   2,000 Units at 08/09/12 1033  . ciprofloxacin (CIPRO) tablet 500 mg  500 mg Oral Daily Wilburt Finlay, PA   500 mg at 08/09/12 1033  . diltiazem (CARDIZEM CD) 24 hr capsule 180 mg  180 mg  Oral Daily Wilburt Finlay, PA   180 mg at 08/09/12 1034  . diltiazem (CARDIZEM SR) 12 hr capsule 60 mg  60 mg Oral Once Wilburt Finlay, PA   60 mg at 08/08/12 1337  . docusate sodium (COLACE) capsule 200 mg  200 mg Oral BID Nada Boozer, NP   200 mg at 08/09/12 1033  . ezetimibe (ZETIA) tablet 10 mg  10 mg Oral Daily Nada Boozer, NP   10 mg at 08/09/12 1033  . feeding supplement (ENSURE COMPLETE) liquid 237 mL  237 mL Oral BID BM Chrystie Nose, MD   237 mL at 08/09/12 1033  . furosemide (LASIX) tablet 80 mg  80 mg Oral BID Marykay Lex, MD   80 mg at 08/09/12 0811  . HYDROcodone-acetaminophen (NORCO/VICODIN) 5-325 MG per tablet 1-2 tablet  1-2 tablet Oral Q4H PRN Thurmon Fair, MD   2 tablet at 08/08/12 2233  . loratadine (CLARITIN) tablet 10 mg  10 mg Oral Daily Nada Boozer, NP   10 mg at 08/09/12 1033  . metoprolol succinate (TOPROL-XL) 24 hr tablet 25 mg  25 mg Oral Daily Nada Boozer, NP   25 mg at 08/09/12 1033  . nitroGLYCERIN (NITROSTAT) SL tablet 0.4 mg  0.4 mg Sublingual Q5 Min x  3 PRN Nada Boozer, NP      . ondansetron [facility]) injection 4 mg  4 mg Intravenous Q6H PRN Nada Boozer, NP   4 mg at 08/07/12 1509  . pantoprazole (PROTONIX) EC tablet 40 mg  40 mg Oral Q1200 Nada Boozer, NP   40 mg at 08/08/12 1337  . polyethylene glycol (MIRALAX / GLYCOLAX) packet 17 g  17 g Oral BID Nada Boozer, NP   17 g at 08/09/12 1033  . senna (SENOKOT) tablet 8.6 mg  1 tablet Oral Daily Nada Boozer, NP   8.6 mg at 08/09/12 1033  . traMADol (ULTRAM) tablet 50 mg  50 mg Oral Q6H PRN Abelino Derrick, PA   50 mg at 08/07/12 1843  . zolpidem (AMBIEN) tablet 5 mg  5 mg Oral QHS Eda Paschal [place], [place]   5 mg at 08/08/12 2230  . DISCONTD: amiodarone (NEXTERONE) IV bolus only 150 mg/100 mL  150 mg Intravenous STAT Wilburt Finlay, PA      . DISCONTD: diltiazem (CARDIZEM CD) 24 hr capsule 120 mg  120 mg Oral Daily Eda Paschal [place], [place]   120 mg at 08/08/12 1039    PE: General appearance: alert, cooperative and tired,  mobidlyobese Neck: no carotid bruit and no JVD Lungs: diminished breath sounds bibasilar Heart: regular rate and rhythm, S1, S2 normal, no murmur, click, rub or gallop Abdomen: soft, non-tender; bowel sounds normal; no masses,  no organomegaly and mild LUQ tenderness.  mobidly obese Extremities: edema ~@+ BLEE Pulses: difficult to palpate wiht edema & obesity.  Lab Results:   Basename 08/09/12 0650 08/08/12 0625 08/07/12 0555  WBC 17.3* 19.7* 17.2*  HGB 7.7* 8.9* 9.8*  HCT 23.6* 28.1* 29.7*  PLT 511* 548* 588*   BMET  Basename 08/09/12 0650 08/08/12 0625 08/07/12 1933  NA 129* 131* 131*  K 5.0 4.7 4.5  CL 87* 86* 86*  CO2 33* 32 30  GLUCOSE 91 98 136*  BUN 72* 66* 65*  CREATININE 3.95* 3.80* 3.56*  CALCIUM 8.7 9.2 9.0   PT/INR  Basename 08/09/12 0650 08/08/12 0625 08/07/12 0555  LABPROT 35.2* 35.1* 35.0*  INR 3.80* 3.78* 3.77*   Cholesterol No results found for this basename: CHOL in the last 72 hours Cardiac Enzymes No components found with this basename: TROPONIN:3, CKMB:3  Studies/Results: @RISRSLT2 @   Assessment/Plan  Principal Problem:  *Sustained SVT, with need for emergent DCCV 07/24/12 Active Problems:  Pulmonary embolism, Oct 2012, suspected recurrence 07/24/12  CKD (chronic kidney disease) stage 4, GFR 15-29 ml/min  Hypertension  GERD (gastroesophageal reflux disease)  Morbid obesity  Acute on chronic diastolic congestive heart failure -- secondary to rapid SVT, aFib RVR  Lymphedema  Type II or unspecified type diabetes mellitus without mention of complication, "Diet" controlled  Presence of IVC filter  Aortic stenosis, moderate; Mean gradient of by cath  PAF, recurrent 07/24/12- Amiodarone added- NSR  H/O: GI bleed, severe (re-challenge with Coumadin this admission secondary to recuurent PE with IVCF)  CAD (coronary artery disease) - Mild-moderate with ~30% RCA lesion by cath  SOB (shortness of breath)  Hypotensive episode, secondary to  SVT with HR 200  Respiratory failure, acute, secondary to tachycardia, diastolic HF  Chronic diastolic heart failure grade 1, NL LVF echo Aug 2013  Anemia  Chronic renal insufficiency, stage IV (severe)  Acute on chronic renal failure  Plan:  Another episode of AF RVR at 1400 hrs yesterday.  Now in NSR.  Hgb has dropped again from 8.9  to 7.7.  Will give one unit PRBC.  INR still elevated at 3.80.  Not budging.  On Amio and Cipro(for UTI).  Will give 2.5 Vitamin K.  CT chest/abd/pelvis.  w/o CM.     LOS: 16 days    HAGER, BRYAN 08/09/2012 10:49 AM  I have seen & examined the patient this AM  Along with Mr. Leron Croak - my exam above. I agree with his findigns, exma, recommendation above.  With continued drops in Hgb level -- need to transfuse yet again; at this point with no clear source of bleeding visible - will scan her ABd/Pelvis & Upper legs to eval for occult hematoma.  Will need to at least partially reverse her anticoagulation & may simply need to accept that she will not be a safe anticoagulation candidate.  Will continue to let INR drift down,but will give low dose of Vit K today.  Creatinine continues to climb & Lasix held x several days -- edema worsening.  Will re-consult Nephrology for advice & assistance.  Afib with intermittent RVR - less frequent short burtsts -- on Amio & continuing to load, increased CCB dose yesterday, but BP is borderline.    BNP elevated -- becoming less helpful for clinical interpretation.  UTI - on Cipro (day 6 of 7) - certainly not helping with INR  Very frustrating situation with diminishing returns.    Marykay Lex, M.D., M.S. [facility] [address] [place], [place]  [address]  [phone] Pager # [phone] 08/09/2012 11:44 AM

## 2012-08-09 NOTE — Consult Note (Addendum)
Christine Christine [name] 08/09/2012 Christine Christine [name] Requesting Physician:  Dr. Rennis Christine [name]  Reason for Consult:  Acute and chronic renal failure HPI: The patient is a 78 y.o. year-old AAF with hx of MO, HTN, DJD, CKD, CAD w MI in '89, diast HF, afib and aortic stenosis of moderate severity.  She had uterine cancer, high-grade, in 2009 treated with hysterectomy and radiation. She has had renal stonesremoved surgically from the L kidney in the past. She had a PE in Oct 2012 with vaginal bleeding on coumadin and an IVC filter was placed. Her baseline renal function is creat 2.4-2.6 in Aug 2013. She was admitted on 07/24/12 for worsening SOB. She was found to be in SVT on arrival. She was treated on admission with adenosine, cardioversion x 2 and IV amiodarone required to control the arrythmias. The creat was 3.29 on admission, improved down to 2.4 on 10/13, and now has been gradually increasing up to 3.80 yesterday and 3.95 today. Weight is up 107 > 112 kg since admit. BP's have been low the last several days in the 90's.  Early in the admission they were 110-120 range systolic.   Cipro > 10/21 - current Lasix > 80 po bid from 10/10 - 10/21, then held except for one dose yest and today Lasix > 3-4 doses of IV lasix given since admission Protonix > 40 po daily since admit No IV contrast, NSAID's, ACEI or ARB's  UA > (10/22) 100 prot, 3-6 wbc, 20-50 rbc, many bacteria CXR 10/10- mild CHF           10/11 - worsening CHF            10/20- improving CHF but persistent effusions and marked CM  ROS  denied ha, cough, cp, sob  no n/v/Christine [name], appetite ok  no f/c/s  no joint pain   no rash or itching  no focal weakness  She lives with her son. She has family who come in to help her with meals. She doesn't cook.  Ambulates in the house with a walker. Uses a wheelchair when she goes to her dr's appts. No etoh or tobacco use.  Past Medical History:  Past Medical History  Diagnosis Date  . GERD (gastroesophageal reflux  disease)   . Hypertension   . Morbid obesity   . Arthritis   . Chronic kidney disease     nephrolithiasis left kidney,s/p open resection  . Status post arthroscopic surgery of right knee   . Chronic back pain   . Pneumonia 07/2011    hx  . Pulmonary embolism 07/2011    hx  . Uterine cancer     endometrial high grade adenocarcinoma  . Uterus cancer, sarcoma     high grade invasive carcinosarcoma  . Myocardial infarction 1989  . Bronchitis   . H/O cardiomegaly   . Lymphedema 09/29/2011  . Diabetes mellitus, diet controlled 10/03/2011  . Chronic kidney disease, stage II (mild) 09/20/2011  . Acute diastolic congestive heart failure, 2D Oct 2012 09/29/2011  . Presence of IVC filter 01/08/2012  . History of colon polyps 01/08/2012  . Chest pain 01/16/2012  . Aortic stenosis, moderate 01/16/2012  . PAF (paroxysmal atrial fibrillation), 01/18/12 01/18/2012  . CAD (coronary artery disease) 03/14/2012  . Nephrolithiasis 03/14/2012  . Diverticulosis 03/14/2012  . Allergic rhinitis, cause unspecified 05/04/2012  . Gout 06/25/2012  . Radiation 02/03/2009    4500 cGy 25 fx iridium-192/uterine  . Sustained SVT, with need for emergent DCCV 07/24/12 07/24/2012  .  SOB (shortness of breath) 07/24/2012  . Hypotensive episode, secondary to SVT with HR 200 07/24/2012  . Respiratory failure, acute, secondary to tachycardia, diastolic HF 07/24/2012    Past Surgical History:  Past Surgical History  Procedure Date  . Colonoscopy     multiple  . Knee arthroscopy     right knee  . Cataract extraction, bilateral   . Tubal ligation 1973  . Abdominal hysterectomy 10/05/2008    total with b/l salpingo-oopherectomy  . Kidney stone removal   . Open left knee cartilage repair   . Left index finger surgery   . Cardiac catheterization     Family History:  Family History  Problem Relation Age of Onset  . Breast cancer Sister     1st sister  . Colon cancer Sister     2nd sister  . Cervical cancer Sister      3rd sister   Social History:  reports that she has never smoked. She has never used smokeless tobacco. She reports that she does not drink alcohol or use illicit drugs.  Allergies: No Known Allergies  Home medications: Prior to Admission medications   Medication Sig Start Date End Date Taking? Authorizing Provider  acetaminophen (TYLENOL) 500 MG tablet Take 500 mg by mouth every 6 (six) hours as needed. For pain   Yes Historical Provider, MD  albuterol (PROVENTIL HFA;VENTOLIN HFA) 108 (90 BASE) MCG/ACT inhaler Inhale 2 puffs into the lungs every 6 (six) hours as needed. For shortness of breath   Yes Historical Provider, MD  allopurinol (ZYLOPRIM) 100 MG tablet Take 100 mg by mouth daily.   Yes Historical Provider, MD  aspirin EC 81 MG tablet Take 81 mg by mouth daily.   Yes Historical Provider, MD  cholecalciferol (VITAMIN Christine [name]) 1000 UNITS tablet Take 2,000 Units by mouth daily.    Yes Historical Provider, MD  docusate sodium (COLACE) 100 MG capsule Take 200 mg by mouth 2 (two) times daily. For stool softner   Yes Historical Provider, MD  dronedarone (MULTAQ) 400 MG tablet Take 400 mg by mouth 2 (two) times daily with a meal.   Yes Historical Provider, MD  ezetimibe (ZETIA) 10 MG tablet Take 10 mg by mouth daily.    Yes Historical Provider, MD  fexofenadine (ALLEGRA) 60 MG tablet Take 60 mg by mouth daily.   Yes Historical Provider, MD  furosemide (LASIX) 80 MG tablet Take 80 mg by mouth 2 (two) times daily.   Yes Historical Provider, MD  metoprolol succinate (TOPROL-XL) 25 MG 24 hr tablet Take 25 mg by mouth daily.   Yes Historical Provider, MD  nitroGLYCERIN (NITROSTAT) 0.4 MG SL tablet Place 0.4 mg under the tongue every 5 (five) minutes as needed. For chest pain. Do not exceed 3 tablets. If pain is not resolved after 3rd tablet call 911.   Yes Historical Provider, MD  Nutritional Supplements (ENSURE PO) Take 1 each by mouth 2 (two) times daily as needed. For supplement   Yes Historical  Provider, MD  omeprazole (PRILOSEC) 20 MG capsule Take 20 mg by mouth daily.   Yes Historical Provider, MD  polyethylene glycol (MIRALAX / GLYCOLAX) packet Take 17 g by mouth 2 (two) times daily.    Yes Historical Provider, MD  senna (SENOKOT) 8.6 MG TABS Take 1 tablet by mouth.   Yes Historical Provider, MD  zolpidem (AMBIEN) 5 MG tablet Take 5-10 mg by mouth at bedtime as needed. For sleep.   Yes Historical Provider, MD  Inpatient medications:    . amiodarone  400 mg Oral BID  . aspirin EC  81 mg Oral Daily  . cholecalciferol  2,000 Units Oral Daily  . ciprofloxacin  500 mg Oral Daily  . diltiazem  180 mg Oral Daily  . docusate sodium  200 mg Oral BID  . ezetimibe  10 mg Oral Daily  . feeding supplement  237 mL Oral BID BM  . furosemide      . furosemide  20 mg Intravenous Once  . furosemide  80 mg Oral BID  . loratadine  10 mg Oral Daily  . metoprolol succinate  25 mg Oral Daily  . pantoprazole  40 mg Oral Q1200  . phytonadione  5 mg Oral Once  . polyethylene glycol  17 g Oral BID  . senna  1 tablet Oral Daily  . zolpidem  5 mg Oral QHS    Labs: Basic Metabolic Panel:  Lab 08/09/12 4098 08/08/12 0625 08/07/12 1933 08/07/12 0555 08/06/12 0610 08/05/12 0621 08/04/12 0729  NA 129* 131* 131* 130* 131* 129* 130*  K 5.0 4.7 4.5 5.9* 5.6* 5.3* 5.5*  CL 87* 86* 86* 87* 88* 87* 88*  CO2 33* 32 30 27 33* 32 34*  GLUCOSE 91 98 136* 114* 93 96 85  BUN 72* 66* 65* 62* 59* 55* 50*  CREATININE 3.95* 3.80* 3.56* 3.47* 3.41* 3.21* 3.03*  ALB -- -- -- -- -- -- --  CALCIUM 8.7 9.2 9.0 9.3 9.0 8.9 9.2  PHOS -- -- -- -- -- -- --   Liver Function Tests: No results found for this basename: AST:3,ALT:3,ALKPHOS:3,BILITOT:3,PROT:3,ALBUMIN:3 in the last 168 hours No results found for this basename: LIPASE:3,AMYLASE:3 in the last 168 hours No results found for this basename: AMMONIA:3 in the last 168 hours CBC:  Lab 08/09/12 0650 08/08/12 0625 08/07/12 0555 08/06/12 0610  WBC 17.3* 19.7*  17.2* 20.5*  NEUTROABS -- -- -- --  HGB 7.7* 8.9* 9.8* 7.7*  HCT 23.6* 28.1* 29.7* 24.1*  MCV 92.5 91.8 91.1 95.3  PLT 511* 548* 588* 604*   PT/INR: @labrcntip (inr:5) Cardiac Enzymes: No results found for this basename: CKTOTAL:5,CKMB:5,CKMBINDEX:5,TROPONINI:5 in the last 168 hours CBG:  Lab 08/06/12 1122  GLUCAP 95    Iron Studies: No results found for this basename: IRON:30,TIBC:30,TRANSFERRIN:30,FERRITIN:30 in the last 168 hours  Xrays/Other Studies: Ct Abdomen Pelvis Wo Contrast  08/09/2012  *RADIOLOGY REPORT*  Clinical Data: Acute hemoglobin drop  CT ABDOMEN AND PELVIS WITHOUT CONTRAST  Technique:  Multidetector CT imaging of the abdomen and pelvis was performed following the standard protocol without intravenous contrast.  Comparison: None.  Findings: Mild atelectasis lung bases.  There is a large pericardial fluid collection measuring up to 3.3 cm in depth along the right heart wall.  This fluid collection is benign simple fluid attenuation.  The liver, of the gallbladder, pancreas, spleen, adrenal glands, and kidneys show no acute findings.  There is a simple cyst within the right kidney.  The kidneys are atrophic.  Small hiatal hernia is present.  No evidence of bowel obstruction.  Abdominal aorta normal caliber.  There is a IVC filter within the inflow of renal cava.  No free fluid the pelvis.  The bladder is normal.  Post hysterectomy anatomy.  No evidence of hematoma within the abdomen pelvis.  No aggressive osseous lesions.  IMPRESSION: 1.  New moderate to large  pericardial fluid collection. Differential includes hemopericardium versus a complex pericardial effusion.  2.  Bibasilar atelectasis. 3.  No acute  findings in the abdomen or pelvis.  Findings conveyed to Dr. Herbie Baltimore on 08/09/2012 and 1450 hours   Original Report Authenticated By: Genevive Bi, M.Christine [name].     Physical Exam:  Blood pressure 112/64, pulse 76, temperature 98 F (36.7 C), temperature source Oral, resp. rate  18, height 5\' 6"  (1.676 m), weight 112.991 kg (249 lb 1.6 oz), SpO2 100.00%.  Gen: alert, obese AAF lying 30 deg in bed, no distress Skin: no rash, cyanosis HEENT:  EOMI, sclera anicteric, throat slightly dry Neck: neck veins are flat, no JVD, no bruits or LAN Chest: harsh crackles at L base, clear elsewhere, no wheezing Heart: regular, no rub or gallop, soft SEMf Abdomen: soft, obese, no ascites or abd wall edema, no HSM or mass Ext: marked obesity of LE's, also 2+ pitting edema throughout upper and lower legs bilat. No ulcer, gangrene or erythema.  Neuro: alert, Ox3, no focal deficit, no asterixis. LE weakness 3/5 bilat, UE 4/5 bilat, gait not tested   Impression/Plan 1. Acute on CRF- in setting of anemia, low BP's, vol overload despite attempted diuresis (thru 10/21).  Could have AIN due to meds (cipro, PPI).  I have Christine [name]/c'Christine [name] these meds.  Unlikely, but she could have GN with microhematuria, but she is a poor candidate for immunosuppressive therapy so would not pursue this.  Most likely this is hemodynamic from worsening hypotension which may likely be related to the significant pericardial effusion seen on today's abd CT.  She is on 240 cardizem daily and 25 mg toprol-xl daily; ideally these should be held with the low BP and pericardial effusion.  Will speak with cardiology regarding this. Prognosis is guarded. There is volume excess in my opinion given persistent LE edema, CXR changes (which have improved some) and increasing weights, but I would hesitate to give more lasix in the current setting of low BP, pericardial effusion and rising creatinine.  She is not having any respiratory symptoms currently. Will hold lasix. She is not a candidate for dialysis due to significant comorbidities and chronic debility. I have spoken to patient regarding this. Recommend supportive care as tolerated. Would recommend having palliative care team get involved also at some point.   2. Mod to large pericardial  effusion- by abd CT done today. Discussed with cardiology PA 3. CKD stage IV at basline w creat 2.4-2.6 in Aug 2013 4. SVT and afib- per cardiology 5. Anemia- this is likely due to renal failure in part. Abd CT did not show any RP bleed. Iron stores are adequate for ESA therapy and I will start her on Aranesp.  This won't kick in right away , however, and transfusion is the only other option for now to be given as needed. 6. Morbid obesity 7. Diast HF 8. HTN- BP's low now 9. Hx PE w IVC filter in place   Thanks for the referral, will follow.   Vinson Moselle  MD [employer] [phone] pgr    [phone] cell 08/09/2012, 7:28 PM

## 2012-08-09 NOTE — Progress Notes (Signed)
I spoke with Dr Arlean Hopping. Will stop Cardizem secondary to hypotension, continue Amiodarone and low dose beta blocker for AF.  Echo ordered to evaluate pericardia effusion seen on CT.  Carmon Sahli PA-C 08/09/2012 10:02 PM

## 2012-08-10 LAB — TYPE AND SCREEN
ABO/RH(D): O POS
Antibody Screen: NEGATIVE
Unit division: 0
Unit division: 0

## 2012-08-10 LAB — RENAL FUNCTION PANEL
Albumin: 2 g/dL — ABNORMAL LOW (ref 3.5–5.2)
BUN: 78 mg/dL — ABNORMAL HIGH (ref 6–23)
Calcium: 8.7 mg/dL (ref 8.4–10.5)
Creatinine, Ser: 3.99 mg/dL — ABNORMAL HIGH (ref 0.50–1.10)
Glucose, Bld: 84 mg/dL (ref 70–99)
Phosphorus: 5.4 mg/dL — ABNORMAL HIGH (ref 2.3–4.6)

## 2012-08-10 LAB — CBC
HCT: 29 % — ABNORMAL LOW (ref 36.0–46.0)
MCH: 29.8 pg (ref 26.0–34.0)
MCHC: 32.4 g/dL (ref 30.0–36.0)
MCV: 92.1 fL (ref 78.0–100.0)
Platelets: 490 10*3/uL — ABNORMAL HIGH (ref 150–400)
RDW: 15.7 % — ABNORMAL HIGH (ref 11.5–15.5)
WBC: 16.4 10*3/uL — ABNORMAL HIGH (ref 4.0–10.5)

## 2012-08-10 NOTE — Progress Notes (Signed)
  Echocardiogram 2D Echocardiogram has been performed.  Christine [name] 08/10/2012, 10:15 AM

## 2012-08-10 NOTE — Progress Notes (Signed)
Subjective: No new complaints, ECHO just done.   Objective Vital signs in last 24 hours: Filed Vitals:   08/09/12 1725 08/09/12 2100 08/10/12 0630 08/10/12 1036  BP: 112/64 101/42 96/49 123/48  Pulse: 76 71 70 51  Temp: 98 F (36.7 C) 98.1 F (36.7 C) 98.3 F (36.8 C)   TempSrc: Oral Oral Oral   Resp: 18 18 18    Height:      Weight:   112.7 kg (248 lb 7.3 oz)   SpO2:  100% 100%    Weight change: -0.291 kg (-10.3 oz)  Intake/Output Summary (Last 24 hours) at 08/10/12 1101 Last data filed at 08/10/12 0900  Gross per 24 hour  Intake   1080 ml  Output    375 ml  Net    705 ml   Labs: Basic Metabolic Panel:  Lab 08/10/12 1610 08/09/12 0650 08/08/12 0625 08/07/12 1933 08/07/12 0555 08/06/12 0610 08/05/12 0621  NA 128* 129* 131* 131* 130* 131* 129*  K 5.1 5.0 4.7 4.5 5.9* 5.6* 5.3*  CL 85* 87* 86* 86* 87* 88* 87*  CO2 33* 33* 32 30 27 33* 32  GLUCOSE 84 91 98 136* 114* 93 96  BUN 78* 72* 66* 65* 62* 59* 55*  CREATININE 3.99* 3.95* 3.80* 3.56* 3.47* 3.41* 3.21*  ALB -- -- -- -- -- -- --  CALCIUM 8.7 8.7 9.2 9.0 9.3 9.0 8.9  PHOS 5.4* -- -- -- -- -- --   Liver Function Tests:  Lab 08/10/12 0545  AST --  ALT --  ALKPHOS --  BILITOT --  PROT --  ALBUMIN 2.0*   No results found for this basename: LIPASE:3,AMYLASE:3 in the last 168 hours No results found for this basename: AMMONIA:3 in the last 168 hours CBC:  Lab 08/10/12 0545 08/09/12 0650 08/08/12 0625 08/07/12 0555  WBC 16.4* 17.3* 19.7* 17.2*  NEUTROABS -- -- -- --  HGB 9.4* 7.7* 8.9* 9.8*  HCT 29.0* 23.6* 28.1* 29.7*  MCV 92.1 92.5 91.8 91.1  PLT 490* 511* 548* 588*   PT/INR: @labrcntip (inr:5) Cardiac Enzymes: No results found for this basename: CKTOTAL:5,CKMB:5,CKMBINDEX:5,TROPONINI:5 in the last 168 hours CBG:  Lab 08/06/12 1122  GLUCAP 95    Iron Studies: No results found for this basename: IRON:30,TIBC:30,TRANSFERRIN:30,FERRITIN:30 in the last 168 hours  Physical Exam:  Blood pressure 123/48,  pulse 51, temperature 98.3 F (36.8 C), temperature source Oral, resp. rate 18, height 5\' 6"  (1.676 m), weight 112.7 kg (248 lb 7.3 oz), SpO2 100.00%.  Gen: alert, obese AAF lying 30 deg in bed, no distress  Skin: no rash, cyanosis  HEENT: EOMI, sclera anicteric, throat slightly dry  Neck: neck veins are flat, no JVD, no bruits or LAN  Chest: harsh crackles at L base, clear elsewhere, no wheezing  Heart: regular, no rub or gallop, soft SEMf  Abdomen: soft, obese, no ascites or abd wall edema, no HSM or mass  Ext: marked obesity of LE's, also 2+ pitting edema throughout upper and lower legs bilat. No ulcer, gangrene or erythema.  Neuro: alert, Ox3, no focal deficit, no asterixis. LE weakness 3/5 bilat, UE 4/5 bilat, gait not tested  Impression/Plan   1. Acute on CRF- prob hemodynamic with low BP's. Less likely AIN, GN. Pericardial effusion being evaluated. Probably uremic, not a candidate for dialysis due to severe comorbidities. Would strongly recommend palliative care consult (today). I discussed code status with patient and her son (caretaker) this morning and she requested no heroic measures; family was present and  in agreement. D/W Dr. Herbie Baltimore. Will write DNR order. Don't know if renal function will recover significantly or not. Holding lasix for now with rising creat. Cardizem d/c'd, still on metoprolol. Keep SBP over 100-105 if possible. 2. Mod to large pericardial effusion- ECHO done , results pend 3. CKD stage IV at basline w creat 2.4-2.6 in Aug 2013 4. SVT and afib- per cardiology. In NSR now. 5. Anemia of CKD- s/p transfusion. Iron stores adequate, started on Aranesp.  6. Morbid obesity 7. Diast HF 8. HTN- BP's low now 9. Hx PE w IVC filter in place    Christine Moselle  MD [facility] [phone] pgr    [phone] cell 08/10/2012, 11:01 AM

## 2012-08-10 NOTE — Progress Notes (Signed)
[facility]  Subjective:   Objective: Vital signs in last 24 hours: Temp:  [97.5 F (36.4 C)-98.3 F (36.8 C)] 98.3 F (36.8 C) (10/27 0630) Pulse Rate:  [51-80] 51  (10/27 1036) Resp:  [17-18] 18  (10/27 0630) BP: (81-123)/(42-67) 123/48 mmHg (10/27 1036) SpO2:  [100 %] 100 % (10/27 0630) Weight:  [112.7 kg (248 lb 7.3 oz)] 112.7 kg (248 lb 7.3 oz) (10/27 0630) Last BM Date: 08/09/12  Intake/Output from previous day: 10/26 0701 - 10/27 0700 In: 600 [I.V.:250; Blood:350] Out: 150 [Urine:150] Intake/Output this shift: Total I/O In: 480 [P.O.:480] Out: 225 [Urine:225]  Medications Current Facility-Administered Medications  Medication Dose Route Frequency Provider Last Rate Last Dose  . 0.9 %  sodium chloride infusion   Intravenous Continuous Nada Boozer, NP 10 mL/hr at 07/29/12 0655    . 0.9 %  sodium chloride infusion   Intravenous Continuous Marykay Lex, MD 10 mL/hr at 07/25/12 [phone]    . acetaminophen (TYLENOL) tablet 650 mg  650 mg Oral Q4H PRN Nada Boozer, NP   650 mg at 08/02/12 2354  . albuterol (PROVENTIL HFA;VENTOLIN HFA) 108 (90 BASE) MCG/ACT inhaler 2 puff  2 puff Inhalation Q6H PRN Nada Boozer, NP   2 puff at 07/29/12 1844  . ALPRAZolam Prudy Feeler) tablet 0.25 mg  0.25 mg Oral Q8H PRN Abelino Derrick, PA   0.25 mg at 08/02/12 1728  . alum & mag hydroxide-simeth (MAALOX/MYLANTA) 200-200-20 MG/5ML suspension 30 mL  30 mL Oral Q4H PRN Nada Boozer, NP   30 mL at 07/24/12 2139  . amiodarone (PACERONE) tablet 400 mg  400 mg Oral BID Nada Boozer, NP   400 mg at 08/10/12 1036  . aspirin EC tablet 81 mg  81 mg Oral Daily Nada Boozer, NP   81 mg at 08/10/12 1036  . cholecalciferol (VITAMIN D) tablet 2,000 Units  2,000 Units Oral Daily Nada Boozer, NP   2,000 Units at 08/10/12 1036  . darbepoetin (ARANESP) injection 100 mcg  100 mcg Subcutaneous Weekly Maree Krabbe, MD   100 mcg at 08/09/12 2054  . docusate sodium (COLACE) capsule 200 mg   200 mg Oral BID Nada Boozer, NP   200 mg at 08/10/12 1040  . ezetimibe (ZETIA) tablet 10 mg  10 mg Oral Daily Nada Boozer, NP   10 mg at 08/10/12 1036  . feeding supplement (ENSURE COMPLETE) liquid 237 mL  237 mL Oral BID BM Chrystie Nose, MD   237 mL at 08/10/12 1040  . furosemide (LASIX) 10 MG/ML injection           . furosemide (LASIX) injection 20 mg  20 mg Intravenous Once Wilburt Finlay, PA   20 mg at 08/09/12 1808  . HYDROcodone-acetaminophen (NORCO/VICODIN) 5-325 MG per tablet 1-2 tablet  1-2 tablet Oral Q4H PRN Thurmon Fair, MD   2 tablet at 08/10/12 0102  . loratadine (CLARITIN) tablet 10 mg  10 mg Oral Daily Nada Boozer, NP   10 mg at 08/10/12 1036  . metoprolol succinate (TOPROL-XL) 24 hr tablet 25 mg  25 mg Oral Daily Nada Boozer, NP   25 mg at 08/10/12 1036  . nitroGLYCERIN (NITROSTAT) SL tablet 0.4 mg  0.4 mg Sublingual Q5 Min x 3 PRN Nada Boozer, NP      . ondansetron [facility]) injection 4 mg  4 mg Intravenous Q6H PRN Nada Boozer, NP   4 mg at 08/09/12 1806  . phytonadione (VITAMIN K) 2.5 mg  in dextrose 5 % 50 mL IVPB  2.5 mg Intravenous Once Wilburt Finlay, PA   2.5 mg at 08/09/12 2054  . phytonadione (VITAMIN K) tablet 5 mg  5 mg Oral Once Wilburt Finlay, PA   5 mg at 08/09/12 1336  . polyethylene glycol (MIRALAX / GLYCOLAX) packet 17 g  17 g Oral BID Nada Boozer, NP   17 g at 08/10/12 1040  . senna (SENOKOT) tablet 8.6 mg  1 tablet Oral Daily Nada Boozer, NP   8.6 mg at 08/10/12 1036  . traMADol (ULTRAM) tablet 50 mg  50 mg Oral Q6H PRN Abelino Derrick, PA   50 mg at 08/07/12 1843  . zolpidem (AMBIEN) tablet 5 mg  5 mg Oral QHS Eda Paschal [place], [place]   5 mg at 08/09/12 2059  . DISCONTD: ciprofloxacin (CIPRO) tablet 500 mg  500 mg Oral Daily Wilburt Finlay, PA   500 mg at 08/09/12 1033  . DISCONTD: diltiazem (CARDIZEM CD) 24 hr capsule 180 mg  180 mg Oral Daily Wilburt Finlay, PA   180 mg at 08/09/12 1034  . DISCONTD: furosemide (LASIX) tablet 80 mg  80 mg Oral BID Marykay Lex, MD   80  mg at 08/09/12 0811  . DISCONTD: pantoprazole (PROTONIX) EC tablet 40 mg  40 mg Oral Q1200 Nada Boozer, NP   40 mg at 08/09/12 1137    PE:   Lab Results:   Basename 08/10/12 0545 08/09/12 0650 08/08/12 0625  WBC 16.4* 17.3* 19.7*  HGB 9.4* 7.7* 8.9*  HCT 29.0* 23.6* 28.1*  PLT 490* 511* 548*   BMET  Basename 08/10/12 0545 08/09/12 0650 08/08/12 0625  NA 128* 129* 131*  K 5.1 5.0 4.7  CL 85* 87* 86*  CO2 33* 33* 32  GLUCOSE 84 91 98  BUN 78* 72* 66*  CREATININE 3.99* 3.95* 3.80*  CALCIUM 8.7 8.7 9.2   PT/INR  Basename 08/10/12 0545 08/09/12 0650 08/08/12 0625  LABPROT 20.2* 35.2* 35.1*  INR 1.79* 3.80* 3.78*   Cholesterol No results found for this basename: CHOL in the last 72 hours Cardiac Enzymes No components found with this basename: TROPONIN:3, CKMB:3  Studies/Results: LV EF: 50% - 55% ------------------------------------------------------------ Indications: Pericardial effusion 423.9. ------------------------------------------------------------ Study Conclusions  - Left ventricle: The cavity size was normal. Systolic function was normal. The estimated ejection fraction was in the range of 50% to 55%. Doppler parameters are consistent with abnormal left ventricular relaxation (grade 1 diastolic dysfunction). - Aortic valve: Trileaflet; moderately calcified leaflets. There was mild stenosis. Valve area: 2.23cm^2(VTI). Valve area: 1.8cm^2 (Vmax). - Mitral valve: Calcified annulus. - Atrial septum: No defect or patent foramen ovale was identified. - Tricuspid valve: Mild regurgitation. - Pericardium, extracardiac: A probable, moderate, free-flowing pericardial effusion was identified circumferential to the heart. The fluid contained focal strands. Maximally measures 2.86 cm anteriorly. Features were not consistent with tamponade physiology.  CT ABDOMEN AND PELVIS WITHOUT CONTRAST  Technique: Multidetector CT imaging of the abdomen and pelvis was    performed following the standard protocol without intravenous  contrast.  Comparison: None.  Findings: Mild atelectasis lung bases. There is a large  pericardial fluid collection measuring up to 3.3 cm in depth along  the right heart wall. This fluid collection is benign simple fluid  attenuation.  The liver, of the gallbladder, pancreas, spleen, adrenal glands,  and kidneys show no acute findings. There is a simple cyst within  the right kidney. The kidneys are atrophic.  Small hiatal hernia is  present. No evidence of bowel obstruction.  Abdominal aorta normal caliber. There is a IVC filter within the  inflow of renal cava.  No free fluid the pelvis. The bladder is normal. Post  hysterectomy anatomy. No evidence of hematoma within the abdomen  pelvis. No aggressive osseous lesions.  IMPRESSION:  1. New moderate to large pericardial fluid collection.  Differential includes hemopericardium versus a complex pericardial  effusion.  2. Bibasilar atelectasis.  3. No acute findings in the abdomen or pelvis.   Assessment/Plan  Principal Problem:  *Sustained SVT, with need for emergent DCCV 07/24/12 Active Problems:  Pulmonary embolism, Oct 2012, suspected recurrence 07/24/12  CKD (chronic kidney disease) stage 4, GFR 15-29 ml/min  Hypertension  GERD (gastroesophageal reflux disease)  Morbid obesity  Acute on chronic diastolic congestive heart failure -- secondary to rapid SVT, aFib RVR  Lymphedema  Type II or unspecified type diabetes mellitus without mention of complication, "Diet" controlled  Presence of IVC filter  Aortic stenosis, moderate; Mean gradient of by cath  PAF, recurrent 07/24/12- Amiodarone added- NSR  H/O: GI bleed, severe (re-challenge with Coumadin this admission secondary to recuurent PE with IVCF)  CAD (coronary artery disease) - Mild-moderate with ~30% RCA lesion by cath  SOB (shortness of breath)  Hypotensive episode, secondary to SVT with HR 200   Respiratory failure, acute, secondary to tachycardia, diastolic HF  Chronic diastolic heart failure grade 1, NL LVF echo Aug 2013  Anemia  Chronic renal insufficiency, stage IV (severe)  Acute on chronic renal failure  Plan:   1.  Moderate free flowing pericardial effusion by echo initially seen on CT abd/Pelvis 10/26, EF 50-55% with grade one diastolic dysfunction.   2.  Maintaining NSR.  No AFib in the last 24 hours.  Diltiazem DCd per renal 2/2 hypotension.  Amiodarone 400mg  PO bid 3.  INR now 1.79 after reversal with Vitamin K.  No further anticoagulation secondary to recurrent bleeding.  Continue to       Monitor. 4.  Hgb has improved to 9.4 with one unit of PRBCs.  Initially given Aranesp on 10/21. 5.  BP stable this AM.  Low yesterday 81/46.  Diltiazem DCd. 6.  No acute findings in the abd/Pelvis   LOS: 17 days    HAGER, BRYAN 08/10/2012 11:10 AM  I seen and examined the patient's morning along Mr. Leron Croak, [place]. I also discussed her with Dr. Arlean Hopping from nephrology and agree with his assessment of the situation. She sees a will to be stable her last 24 hours with no further arrhythmias. Her hemoglobin has appropriately increased and are stable. The results of the CT scan were noted and the echocardiogram was just read demonstrating the presence of a pericardial effusion with fibrinous material but no evidence of tamponade physiology.  I question if this possibly hemopericardium versus uremic pericarditis.  Her renal function continues to decline despite holding Lasix. Thankfully her white blood cell count is coming down.  Still no definite answers where she continues to lose blood.    Her blood pressure has improved since holding her beta blocker and calcium channel blocker and her rate is actually decreased significantly. I had a frank discussion with the patient and her son who seemed to agree with Dr. Malena Catholic impression recommendations.In light of her dwindling health and  advanced age, she is not a good dialysis candidate and is headed for the direction of potentially requiring dialysis. She is extremely debilitated morbidly obese with multiple comorbidities.  The overall concept  was that we will move forward with medical therapy for symptomatic relief. Unless emergent needs dictate, would not proceed with invasive treatments including pericardiocentesis or dialysis. We will consult palliative care team tomorrow to assist with discussions of goals of care and to transition from aggressive curative treatments to symptom relief and quality of life.  Continue amiodarone for rhythm/rate control. Can likely decreased 200 twice a day on discharge.  Any dilation was reversed with vitamin K both oral and IV base of the cervical os pneumopericardium and continue drops in hemoglobin. At this point no further evaluation will be attempted as she is proven to be unable to tolerate  anticoagulation at least her current condition.  Completing course of antibiotics for UTI.  Moderate large pleural effusion without have not physiology--no acute requirement for pericardiocentesis at this particular point. She would not be an ideal candidate for progressive procedures.  We'll continue to hold diuretics until pressures are more stable. I do suspect that she will probably need some diuretic for symptomatic relief  I agree with continuing Aranesp & iron  Total of 20 minutes was spent with the patient, at least 15 minutes in consultation and discussion of goals of care.  Marykay Lex, M.D., M.S. [facility] [address] [place], [place]  [address]  [phone] Pager # [phone] 08/10/2012 1:46 PM

## 2012-08-10 NOTE — Progress Notes (Signed)
I discussed this plan with Mr. [name] & agree.  Namari Breton W 08/10/2012 1:50 AM

## 2012-08-11 ENCOUNTER — Encounter (HOSPITAL_COMMUNITY): Payer: Self-pay | Admitting: Cardiology

## 2012-08-11 DIAGNOSIS — I3139 Other pericardial effusion (noninflammatory): Secondary | ICD-10-CM

## 2012-08-11 DIAGNOSIS — I313 Pericardial effusion (noninflammatory): Secondary | ICD-10-CM

## 2012-08-11 HISTORY — DX: Pericardial effusion (noninflammatory): I31.3

## 2012-08-11 HISTORY — DX: Other pericardial effusion (noninflammatory): I31.39

## 2012-08-11 LAB — CBC
HCT: 29.5 % — ABNORMAL LOW (ref 36.0–46.0)
Hemoglobin: 9.4 g/dL — ABNORMAL LOW (ref 12.0–15.0)
MCH: 29.8 pg (ref 26.0–34.0)
MCHC: 31.9 g/dL (ref 30.0–36.0)
RBC: 3.15 MIL/uL — ABNORMAL LOW (ref 3.87–5.11)

## 2012-08-11 LAB — PRO B NATRIURETIC PEPTIDE: Pro B Natriuretic peptide (BNP): 2910 pg/mL — ABNORMAL HIGH (ref 0–450)

## 2012-08-11 LAB — RENAL FUNCTION PANEL
CO2: 34 mEq/L — ABNORMAL HIGH (ref 19–32)
Calcium: 8.8 mg/dL (ref 8.4–10.5)
Chloride: 86 mEq/L — ABNORMAL LOW (ref 96–112)
Creatinine, Ser: 3.81 mg/dL — ABNORMAL HIGH (ref 0.50–1.10)
GFR calc Af Amer: 12 mL/min — ABNORMAL LOW (ref >90)
GFR calc non Af Amer: 10 mL/min — ABNORMAL LOW (ref >90)
Glucose, Bld: 84 mg/dL (ref 70–99)

## 2012-08-11 MED ORDER — DARBEPOETIN ALFA-POLYSORBATE 100 MCG/0.5ML IJ SOLN: 100.0000 ug | INTRAMUSCULAR | Status: DC

## 2012-08-11 MED ORDER — FUROSEMIDE 80 MG PO TABS
80.0000 mg | ORAL_TABLET | Freq: Two times a day (BID) | ORAL | Status: DC
  Administered 2012-08-11 – 2012-08-12 (×2): 80 mg via ORAL
  Filled 2012-08-11 (×4): qty 1

## 2012-08-11 MED ORDER — DARBEPOETIN ALFA-POLYSORBATE 100 MCG/0.5ML IJ SOLN
100.0000 ug | INTRAMUSCULAR | Status: DC
  Filled 2012-08-11: qty 0.5

## 2012-08-11 NOTE — Progress Notes (Signed)
Palliative Medicine Team consult received discussed with Nada Boozer, NP request is to confirm goals, assess hospice options in setting of end stage renal disease, not dialysis candidate, spoke with patient at bedside and son Florinda Marker via phone [phone]); meeting scheduled to allow all children to be present for discussion- confirmed meeting time for Tuesday 08/12/12 @ 5-5:30 pm   Valente David, RN 08/11/2012, 3:54 PM Palliative Medicine Team RN Liaison [phone]

## 2012-08-11 NOTE — Progress Notes (Signed)
Physical Therapy Treatment Patient Details Name: Christine [name] MRN: [id number] DOB: November 03, 1930 Today's Date: 08/11/2012 Time: 1133-1201 PT Time Calculation (min): 28 min  PT Assessment / Plan / Recommendation Comments on Treatment Session  Pt admitted with SVT and continues to progress with therapy. Pt able to increase activity tolerance with increase in ambulation distance this am. HR 88 BPM before gait and 108 BPM after gait with decrease after rest back to starting HR.    Follow Up Recommendations  Post acute inpatient     Does the patient have the potential to tolerate intense rehabilitation  No, Recommend SNF  Barriers to Discharge        Equipment Recommendations  None recommended by PT    Recommendations for Other Services    Frequency Min 3X/week   Plan Discharge plan remains appropriate;Frequency remains appropriate    Precautions / Restrictions Precautions Precautions: Fall Restrictions Weight Bearing Restrictions: No   Pertinent Vitals/Pain None    Mobility  Bed Mobility Bed Mobility: Supine to Sit Supine to Sit: 4: Min assist;With rails Details for Bed Mobility Assistance: Assist for trunk to translate anterior with cues for sequence. Transfers Transfers: Sit to Stand;Stand to Sit (3 trials.) Sit to Stand: 4: Min assist;From elevated surface;From bed;From chair/3-in-1 Stand to Sit: 4: Min guard;With upper extremity assist;To chair/3-in-1;To bed Details for Transfer Assistance: Assist for balance and to translate trunk anterior/slow descent. Cues for safe hand placement. Ambulation/Gait Ambulation/Gait Assistance: 4: Min assist Ambulation Distance (Feet): 80 Feet (10, 30, and 40 feet.) Assistive device: Rolling walker Ambulation/Gait Assistance Details: Assist for balance with cues to extend posture tall/upright as well as step into RW. Gait Pattern: Step-through pattern;Decreased stride length;Trunk flexed Gait velocity: Slow gait speed Stairs:  No Wheelchair Mobility Wheelchair Mobility: No    Exercises     PT Diagnosis:    PT Problem List:   PT Treatment Interventions:     PT Goals Acute Rehab PT Goals PT Goal Formulation: With patient Time For Goal Achievement: 08/13/12 Potential to Achieve Goals: Good PT Goal: Supine/Side to Sit - Progress: Progressing toward goal PT Goal: Sit to Stand - Progress: Progressing toward goal PT Goal: Stand to Sit - Progress: Progressing toward goal PT Goal: Ambulate - Progress: Progressing toward goal  Visit Information  Last PT Received On: 08/11/12 Assistance Needed: +1    Subjective Data  Subjective: "I can certainly try." Patient Stated Goal: Home   Cognition  Overall Cognitive Status: Appears within functional limits for tasks assessed/performed Arousal/Alertness: Awake/alert Orientation Level: Appears intact for tasks assessed Behavior During Session: [facility] for tasks performed    Balance  Balance Balance Assessed: No  End of Session PT - End of Session Equipment Utilized During Treatment: Gait belt;Oxygen (2L O2 via [place].) Activity Tolerance: Patient tolerated treatment well;Patient limited by fatigue Patient left: in chair;with call bell/phone within reach Nurse Communication: Mobility status   GP     Cephus Shelling 08/11/2012, 12:48 PM  08/11/2012 Cephus Shelling, PT, DPT [phone]

## 2012-08-11 NOTE — Progress Notes (Signed)
Subjective: Interval History: has complaints weak, sob.  Objective: Vital signs in last 24 hours: Temp:  [97.5 F (36.4 C)-98.2 F (36.8 C)] 97.9 F (36.6 C) (10/28 0530) Pulse Rate:  [74-82] 74  (10/28 1025) Resp:  [18-20] 18  (10/28 0530) BP: (99-102)/(41-55) 100/54 mmHg (10/28 1025) SpO2:  [100 %] 100 % (10/28 0530) Weight:  [113.853 kg (251 lb)] 113.853 kg (251 lb) (10/28 0530) Weight change: 1.153 kg (2 lb 8.7 oz)  Intake/Output from previous day: 10/27 0701 - 10/28 0700 In: 1717 [P.O.:1717] Out: 726 [Urine:725; Stool:1] Intake/Output this shift: Total I/O In: 360 [P.O.:360] Out: 200 [Urine:200]  General appearance: moderately obese, pale and slowed mentation Resp: diminished breath sounds bilaterally and rales bibasilar Cardio: S1, S2 normal and systolic murmur: systolic ejection 2/6, decrescendo at 2nd left intercostal space GI: obese,pos bs, liver down 5 cm Extremities: 2 plus edema  Lab Results:  [facility] 08/11/12 0659 08/10/12 0545  WBC 13.6* 16.4*  HGB 9.4* 9.4*  HCT 29.5* 29.0*  PLT 501* 490*   BMET:  Basename 08/11/12 0659 08/10/12 0545  NA 129* 128*  K 5.2* 5.1  CL 86* 85*  CO2 34* 33*  GLUCOSE 84 84  BUN 79* 78*  CREATININE 3.81* 3.99*  CALCIUM 8.8 8.7   No results found for this basename: PTH:2 in the last 72 hours Iron Studies: No results found for this basename: IRON,TIBC,TRANSFERRIN,FERRITIN in the last 72 hours  Studies/Results: Ct Abdomen Pelvis Wo Contrast  08/09/2012  *RADIOLOGY REPORT*  Clinical Data: Acute hemoglobin drop  CT ABDOMEN AND PELVIS WITHOUT CONTRAST  Technique:  Multidetector CT imaging of the abdomen and pelvis was performed following the standard protocol without intravenous contrast.  Comparison: None.  Findings: Mild atelectasis lung bases.  There is a large pericardial fluid collection measuring up to 3.3 cm in depth along the right heart wall.  This fluid collection is benign simple fluid attenuation.  The liver, of  the gallbladder, pancreas, spleen, adrenal glands, and kidneys show no acute findings.  There is a simple cyst within the right kidney.  The kidneys are atrophic.  Small hiatal hernia is present.  No evidence of bowel obstruction.  Abdominal aorta normal caliber.  There is a IVC filter within the inflow of renal cava.  No free fluid the pelvis.  The bladder is normal.  Post hysterectomy anatomy.  No evidence of hematoma within the abdomen pelvis.  No aggressive osseous lesions.  IMPRESSION: 1.  New moderate to large  pericardial fluid collection. Differential includes hemopericardium versus a complex pericardial effusion.  2.  Bibasilar atelectasis. 3.  No acute findings in the abdomen or pelvis.  Findings conveyed to Dr. Herbie Baltimore on 08/09/2012 and 1450 hours   Original Report Authenticated By: Genevive Bi, M.D.     I have reviewed the patient's current medications.  Assessment/Plan: 1 CKD stable, restart diuretics with effusions. Little else to offer.   2 PAF controlled 3 CAD 4 Obesity 6 DOPD 7AS 8 Hx DVT/PE 9 Anemia epo fe ok. 10 Severe debill need palliative care to see.  p epo. Lasix.     LOS: 18 days   Tyrik Stetzer L 08/11/2012,11:24 AM

## 2012-08-11 NOTE — Progress Notes (Signed)
Clinical Social Worker met with pt and reviewed bed offers; pt remains interested in [facility].  CSW explained that pt currently owes [facility] money and they are not able to offer a bed until this has been paid.   Pt stated understanding and said, "My son will be here shortly, I will talk with him about it".  CSW provided a list of all bed offers and encouraged pt and son to review.  CSW to continue to follow and assist as needed.   Angelia Mould, MSW, [place] [phone]

## 2012-08-11 NOTE — Progress Notes (Signed)
Subjective: No specific complaints  Objective: Vital signs in last 24 hours: Temp:  [97.5 F (36.4 C)-98.2 F (36.8 C)] 97.9 F (36.6 C) (10/28 0530) Pulse Rate:  [51-82] 76  (10/28 0530) Resp:  [18-20] 18  (10/28 0530) BP: (99-123)/(41-55) 99/41 mmHg (10/28 0530) SpO2:  [100 %] 100 % (10/28 0530) Weight:  [113.853 kg (251 lb)] 113.853 kg (251 lb) (10/28 0530) Weight change: 1.153 kg (2 lb 8.7 oz) Last BM Date: 08/10/12 Intake/Output from previous day: +991 10/27 0701 - 10/28 0700 In: 1717 [P.O.:1717] Out: 726 [Urine:725; Stool:1] Intake/Output this shift: Total I/O In: 240 [P.O.:240] Out: 200 [Urine:200]  PE: General:alert and oriented, no acute distress Heart:S1S2 RRR, no obvious murmur, no rub Lungs:clear without rales rhonchi or wheezes Abd:+ BS. Soft non tender ( 3 BMs yesterday) Ext: tr edema (pitting) to just above her ankles    Lab Results:  Basename 08/11/12 0659 08/10/12 0545  WBC 13.6* 16.4*  HGB 9.4* 9.4*  HCT 29.5* 29.0*  PLT 501* 490*   BMET  Basename 08/11/12 0659 08/10/12 0545  NA 129* 128*  K 5.2* 5.1  CL 86* 85*  CO2 34* 33*  GLUCOSE 84 84  BUN 79* 78*  CREATININE 3.81* 3.99*  CALCIUM 8.8 8.7   No results found for this basename: TROPONINI:2,CK,MB:2 in the last 72 hours  Lab Results  Component Value Date   CHOL 139 07/25/2012   HDL 22* 07/25/2012   LDLCALC 97 07/25/2012   TRIG 102 07/25/2012   CHOLHDL 6.3 07/25/2012   Lab Results  Component Value Date   HGBA1C 4.8 07/24/2012     Lab Results  Component Value Date   TSH 2.628 07/24/2012    Hepatic Function Panel  Basename 08/11/12 0659  PROT --  ALBUMIN 2.0*  AST --  ALT --  ALKPHOS --  BILITOT --  BILIDIR --  IBILI --   No results found for this basename: CHOL in the last 72 hours No results found for this basename: PROTIME in the last 72 hours    Tele:  NSR Studies/Results: Ct Abdomen Pelvis Wo Contrast  08/09/2012  *RADIOLOGY REPORT*  Clinical Data: Acute  hemoglobin drop  CT ABDOMEN AND PELVIS WITHOUT CONTRAST  Technique:  Multidetector CT imaging of the abdomen and pelvis was performed following the standard protocol without intravenous contrast.  Comparison: None.  Findings: Mild atelectasis lung bases.  There is a large pericardial fluid collection measuring up to 3.3 cm in depth along the right heart wall.  This fluid collection is benign simple fluid attenuation.  The liver, of the gallbladder, pancreas, spleen, adrenal glands, and kidneys show no acute findings.  There is a simple cyst within the right kidney.  The kidneys are atrophic.  Small hiatal hernia is present.  No evidence of bowel obstruction.  Abdominal aorta normal caliber.  There is a IVC filter within the inflow of renal cava.  No free fluid the pelvis.  The bladder is normal.  Post hysterectomy anatomy.  No evidence of hematoma within the abdomen pelvis.  No aggressive osseous lesions.  IMPRESSION: 1.  New moderate to large  pericardial fluid collection. Differential includes hemopericardium versus a complex pericardial effusion.  2.  Bibasilar atelectasis. 3.  No acute findings in the abdomen or pelvis.  Findings conveyed to Dr. Herbie Baltimore on 08/09/2012 and 1450 hours   Original Report Authenticated By: Genevive Bi, M.D.    2D Echo: 08/10/12: Left ventricle: The cavity size was normal. Systolic function was normal. The  estimated ejection fraction was in the range of 50% to 55%. Doppler parameters are consistent with abnormal left ventricular relaxation (grade 1 diastolic dysfunction). - Aortic valve: Trileaflet; moderately calcified leaflets. There was mild stenosis. Valve area: 2.23cm^2(VTI). Valve area: 1.8cm^2 (Vmax). - Mitral valve: Calcified annulus. - Atrial septum: No defect or patent foramen ovale was identified. - Tricuspid valve: Mild regurgitation. - Pericardium, extracardiac: A probable, moderate, free-flowing pericardial effusion was identified circumferential to  the heart. The fluid contained focal strands. Maximally measures 2.86 cm anteriorly. Features were not consistent with tamponade physiology.   Medications: I have reviewed the patient's current medications.    [name] amiodarone  400 mg Oral BID  . aspirin EC  81 mg Oral Daily  . cholecalciferol  2,000 Units Oral Daily  . darbepoetin (ARANESP) injection - NON-DIALYSIS  100 mcg Subcutaneous Weekly  . docusate sodium  200 mg Oral BID  . ezetimibe  10 mg Oral Daily  . feeding supplement  237 mL Oral BID BM  . loratadine  10 mg Oral Daily  . metoprolol succinate  25 mg Oral Daily  . polyethylene glycol  17 g Oral BID  . senna  1 tablet Oral Daily  . zolpidem  5 mg Oral QHS   Assessment/Plan: Principal Problem:  *Sustained SVT, with need for emergent DCCV 07/24/12 Active Problems:  PAF, recurrent 07/24/12- Amiodarone added- NSR  CKD (chronic kidney disease) stage 4, GFR 15-29 ml/min  Aortic stenosis, moderate; Mean gradient of by cath  H/O: GI bleed, severe (re-challenge with Coumadin this admission secondary to recuurent PE with IVCF)  Pulmonary embolism, Oct 2012, suspected recurrence 07/24/12  Hypertension  GERD (gastroesophageal reflux disease)  Morbid obesity  Acute on chronic diastolic congestive heart failure -- secondary to rapid SVT, aFib RVR  Lymphedema  Type II or unspecified type diabetes mellitus without mention of complication, "Diet" controlled  Presence of IVC filter  CAD (coronary artery disease) - Mild-moderate with ~30% RCA lesion by cath  SOB (shortness of breath)  Hypotensive episode, secondary to SVT with HR 200  Respiratory failure, acute, secondary to tachycardia, diastolic HF  Chronic diastolic heart failure grade 1, NL LVF echo Aug 2013  Anemia  Chronic renal insufficiency, stage IV (severe)  Acute on chronic renal failure  Pericardial effusion  PLAN: Cr. Somewhat improved, K+ slightly elevated, pro BNP stable at 2910 lowest # on the 19th at 1393.    Denies SOB.  No chest pain.  Pericardial effusion present.   H/H stable and INR 1.40, not a coumadin candidate due to recurrent anemias requiring transfusion.   BP still running low- on toprol 25 mg daily.   Pt understands her situation with renal failure.  She does not know if her daughters are aware.  Pt. Is DNR  LOS: 18 days   INGOLD,LAURA R 08/11/2012, 9:28 AM  I have seen & examined Mrs. Yousuf this AM.  I agree with Corliss Blacker note above.  She actually seems to be in good spirits today, sitting up in chair.  Was up with PT today - walked to the door.  Remains very debilitated, but thankfully H&H & Cr stable.  UOP is stable. Agree with holding BB to allow for greater pressures.  Pericardial effusion with no appearance of tamponade physiology -- no plan for Pericardiocentesis or Window.  Pt is not interested in invasive Rx.  After long discussion with the pt., this AM, she would not wish for Intubation or CPR resuscitation -- DNR ordered. Palliative  care team to be consulted today.  Marykay Lex, M.D., M.S. [facility] [address] [place], [place]  [address]  [phone] Pager # [phone] 08/11/2012 1:14 PM

## 2012-08-12 LAB — RENAL FUNCTION PANEL
BUN: 79 mg/dL — ABNORMAL HIGH (ref 6–23)
CO2: 32 mEq/L (ref 19–32)
Chloride: 89 mEq/L — ABNORMAL LOW (ref 96–112)
Creatinine, Ser: 3.76 mg/dL — ABNORMAL HIGH (ref 0.50–1.10)
GFR calc non Af Amer: 11 mL/min — ABNORMAL LOW (ref >90)
Potassium: 5.1 mEq/L (ref 3.5–5.1)

## 2012-08-12 LAB — CBC
Hemoglobin: 9.1 g/dL — ABNORMAL LOW (ref 12.0–15.0)
MCH: 29.6 pg (ref 26.0–34.0)
Platelets: 508 10*3/uL — ABNORMAL HIGH (ref 150–400)
RBC: 3.07 MIL/uL — ABNORMAL LOW (ref 3.87–5.11)
WBC: 11.9 10*3/uL — ABNORMAL HIGH (ref 4.0–10.5)

## 2012-08-12 LAB — PRO B NATRIURETIC PEPTIDE: Pro B Natriuretic peptide (BNP): 2917 pg/mL — ABNORMAL HIGH (ref 0–450)

## 2012-08-12 LAB — PROTIME-INR: INR: 1.35 (ref 0.00–1.49)

## 2012-08-12 MED ORDER — DARBEPOETIN ALFA-POLYSORBATE 100 MCG/0.5ML IJ SOLN: 100.0000 ug | INTRAMUSCULAR | Status: AC

## 2012-08-12 MED ORDER — BIOTENE DRY MOUTH MT LIQD
15.0000 mL | Freq: Two times a day (BID) | OROMUCOSAL | Status: DC
  Administered 2012-08-12: 15 mL via OROMUCOSAL

## 2012-08-12 MED ORDER — AMIODARONE HCL 400 MG PO TABS: 400.0000 mg | ORAL_TABLET | Freq: Two times a day (BID) | ORAL | Status: DC

## 2012-08-12 NOTE — Progress Notes (Signed)
Clinical Social Worker spoke with pt's dtr, Lowella Bandy, and relayed that pt will be ready for dc today.  Dtr reports she is going to [facility] today at 3pm to sign paperwork and pay bill.  CSW spoke with [place] at [facility] to relay information.  CSW updated pt's son, Florinda Marker.  CSW submitted dc summary to [facility].  CSW to continue to follow and assist as needed.    Angelia Mould, MSW, [place] [phone]

## 2012-08-12 NOTE — Progress Notes (Addendum)
Clinical Social Worker received phone call from [place] at [facility] stating pt's dtr paid pt's bill but did not sign paperwork.  CSW spoke with pt's son, Susy Frizzle, who is currently in pt's room.  Matt to leave hospital and sign paperwork.  CSW updated Janey at [facility].  CSW to continue to follow and assist as needed.   Update 4:15pm: CSW received phone call from [place] at [facility] stating family is at facility and signing paperwork, ok to call for transport.  CSW updated RN.  CSW phoned transport.  CSW to sign off at dc.  Please re consult if needed.  Angelia Mould, MSW, [place] [phone]

## 2012-08-12 NOTE — Discharge Summary (Signed)
Physician Discharge Summary  Patient ID: Christine [name] MRN: [id number] DOB/AGE: 76-Feb-1932 76 y.o.  Admit date: 07/24/2012 Discharge date: 08/12/2012  Admission Diagnoses:  Discharge Diagnoses:  Principal Problem:  *Sustained SVT, with need for emergent DCCV 07/24/12 Active Problems:  Pulmonary embolism, Oct 2012, suspected recurrence 07/24/12  CKD (chronic kidney disease) stage 4, GFR 15-29 ml/min  Hypertension  GERD (gastroesophageal reflux disease)  Morbid obesity  Acute on chronic diastolic congestive heart failure -- secondary to rapid SVT, aFib RVR  Lymphedema  Type II or unspecified type diabetes mellitus without mention of complication, "Diet" controlled  Presence of IVC filter  Aortic stenosis, moderate; Mean gradient of by cath  PAF, recurrent 07/24/12- Amiodarone added- NSR  H/O: GI bleed, severe (re-challenge with Coumadin this admission secondary to recuurent PE with IVCF)  CAD (coronary artery disease) - Mild-moderate with ~30% RCA lesion by cath  SOB (shortness of breath)  Hypotensive episode, secondary to SVT with HR 200  Chronic diastolic heart failure grade 1, NL LVF echo Aug 2013  Pericardial effusion   Discharged Condition: stable  Hospital Course:   78 y.o. female with a past medical history significant for mild CAD by [facility]  in April of 2013. She also has moderate aortic stenosis, atrial fibrillation on Multaq, a history of acute on chronic renal failure and diastolic dysfunction +/- heart failure. She has a history of PE which was in October of 2012 and had supratherapeutic INR with vaginal bleeding on coumadin. An IVC filter was placed. She presented with worsening shortness of breath. On arrival was found to be in SVT, IV adenosine was given without improvement., BP was dropping IV fluid bolus was given, and BP remained low. Due to SOB, pt was on BiPap. Pt was then cardioverted with slowing of A-fib but then return to tachycardia. Dr. Rennis Golden added IV  amiodarone bolus and then applied new pads and cardioverted pt with 200 joules, to SR. BP returned to 100 systolic.  IV amiodarone drip was started and d/c her home Multaq.  Admitted to CCU.   Overall the patient had a long hospitalization.  Atrial fibrillation was difficult to control and required supplemental boluses of Amio.  INR remained supratherapeutic for most of stay even after DCing the coumadin.  This may have been a result of the Amio and the Ciprofloxacin which was used to treat her UTI.  She required several units of PRBCs and her INR was ultimately reversed with Vitamin K.  HGB now stable.  Further risk of bleeding outweighs the stroke risk at this time.  Aranesp was started.  Per GI consult:  no overt GI bleeding and was hemocult negative. She is not microcytic or iron deficient on recent labs. Colonoscopy done 4 years ago does not need to be repeated now unless she has overt GI bleeding.  BNP has stabilized around 2900.   She fluctuates between NSR and Afib with CVR.    She has been seen by Dr. Royann Shivers who feels she is stable for DC to skilled nursing facility.  Per the Case Manager, Palliative medicine will complete an outpatient consult.      Consults: Renal, Palliative Med, PT, GI  Significant Diagnostic Studies:   CT ABDOMEN AND PELVIS WITHOUT CONTRAST  Technique: Multidetector CT imaging of the abdomen and pelvis was  performed following the standard protocol without intravenous  contrast.  Comparison: None.  Findings: Mild atelectasis lung bases. There is a large  pericardial fluid collection measuring up to 3.3 cm  in depth along  the right heart wall. This fluid collection is benign simple fluid  attenuation.  The liver, of the gallbladder, pancreas, spleen, adrenal glands,  and kidneys show no acute findings. There is a simple cyst within  the right kidney. The kidneys are atrophic.  Small hiatal hernia is present. No evidence of bowel obstruction.  Abdominal aorta  normal caliber. There is a IVC filter within the  inflow of renal cava.  No free fluid the pelvis. The bladder is normal. Post  hysterectomy anatomy. No evidence of hematoma within the abdomen  pelvis. No aggressive osseous lesions.  IMPRESSION:  1. New moderate to large pericardial fluid collection.  Differential includes hemopericardium versus a complex pericardial  effusion.  2. Bibasilar atelectasis.  3. No acute findings in the abdomen or pelvis.  08/03/12,  PORTABLE CHEST - 1 VIEW  Comparison: Chest 07/25/2012.  Findings: There are bilateral pleural effusions and basilar  airspace disease. Marked enlargement of the cardiopericardial  silhouette without pulmonary edema is identified. There is no  pneumothorax.  IMPRESSION:  1. Left worse than right pleural effusions and basilar airspace  disease.  2. Cardiomegaly without edema.  CBC    Component Value Date/Time   WBC 11.9* 08/12/2012 0522   WBC 14.6* 03/13/2011 1558   WBC 14.6* 03/13/2011 1558   RBC 3.07* 08/12/2012 0522   RBC 3.95 03/13/2011 1558   RBC 3.95 03/13/2011 1558   HGB 9.1* 08/12/2012 0522   HGB 12.6 03/13/2011 1558   HGB 12.6 03/13/2011 1558   HCT 28.9* 08/12/2012 0522   HCT 37.3 03/13/2011 1558   HCT 37.3 03/13/2011 1558   PLT 508* 08/12/2012 0522   PLT 269 03/13/2011 1558   PLT 269 03/13/2011 1558   MCV 94.1 08/12/2012 0522   MCV 94.6 03/13/2011 1558   MCV 94.6 03/13/2011 1558   MCH 29.6 08/12/2012 0522   MCH 32.0 03/13/2011 1558   MCH 32.0 03/13/2011 1558   MCHC 31.5 08/12/2012 0522   MCHC 33.8 03/13/2011 1558   MCHC 33.8 03/13/2011 1558   RDW 15.9* 08/12/2012 0522   RDW 14.4 03/13/2011 1558   RDW 14.4 03/13/2011 1558   LYMPHSABS 2.7 07/24/2012 0934   LYMPHSABS 0.7* 03/13/2011 1558   LYMPHSABS 0.7* 03/13/2011 1558   MONOABS 1.7* 07/24/2012 0934   MONOABS 0.1 03/13/2011 1558   MONOABS 0.1 03/13/2011 1558   EOSABS 0.2 07/24/2012 0934   EOSABS 0.0 03/13/2011 1558   EOSABS 0.0 03/13/2011 1558   BASOSABS 0.0  07/24/2012 0934   BASOSABS 0.0 03/13/2011 1558   BASOSABS 0.0 03/13/2011 1558   BMET    Component Value Date/Time   NA 133* 08/12/2012 0522   K 5.1 08/12/2012 0522   CL 89* 08/12/2012 0522   CO2 32 08/12/2012 0522   GLUCOSE 79 08/12/2012 0522   BUN 79* 08/12/2012 0522   CREATININE 3.76* 08/12/2012 0522   CREATININE 2.50* 06/02/2012 1630   CALCIUM 9.0 08/12/2012 0522   GFRNONAA 11* 08/12/2012 0522   GFRAA 12* 08/12/2012 0522    Treatments:  Amiodarone, Lasix, Aranesp   Discharge Exam: Blood pressure 112/62, pulse 78, temperature 98.2 F (36.8 C), temperature source Oral, resp. rate 18, height 5\' 6"  (1.676 m), weight 109.861 kg (242 lb 3.2 oz), SpO2 99.00%.   Disposition: 01-Home or Self Care      Discharge Orders    Future Appointments: Provider: Department: Dept Phone: Center:   08/18/2012 1:00 PM [facility] [phone] None   11/05/2012  10:30 AM Corwin Levins, MD [facility] [phone] [facility]   07/20/2013 1:00 PM Billie Lade, MD [facility] [phone] None     Future Orders Please Complete By Expires   Diet - low sodium heart healthy      Increase activity slowly          Medication List     As of 08/12/2012 12:21 PM    STOP taking these medications         allopurinol 100 MG tablet   Commonly known as: ZYLOPRIM      dronedarone 400 MG tablet   Commonly known as: MULTAQ      metoprolol succinate 25 MG 24 hr tablet   Commonly known as: TOPROL-XL      TAKE these medications         acetaminophen 500 MG tablet   Commonly known as: TYLENOL   Take 500 mg by mouth every 6 (six) hours as needed. For pain      albuterol 108 (90 BASE) MCG/ACT inhaler   Commonly known as: PROVENTIL HFA;VENTOLIN HFA   Inhale 2 puffs into the lungs every 6 (six) hours as needed. For shortness of breath      amiodarone 400 MG tablet   Commonly known as: PACERONE   Take 1 tablet (400 mg total) by mouth 2 (two) times daily.       aspirin EC 81 MG tablet   Take 81 mg by mouth daily.      cholecalciferol 1000 UNITS tablet   Commonly known as: VITAMIN D   Take 2,000 Units by mouth daily.      darbepoetin 100 MCG/0.5ML Soln   Commonly known as: ARANESP   Inject 0.5 mLs (100 mcg total) into the skin every Saturday at 6 PM.      docusate sodium 100 MG capsule   Commonly known as: COLACE   Take 200 mg by mouth 2 (two) times daily. For stool softner      ENSURE PO   Take 1 each by mouth 2 (two) times daily as needed. For supplement      ezetimibe 10 MG tablet   Commonly known as: ZETIA   Take 10 mg by mouth daily.      fexofenadine 60 MG tablet   Commonly known as: ALLEGRA   Take 60 mg by mouth daily.      furosemide 80 MG tablet   Commonly known as: LASIX   Take 80 mg by mouth 2 (two) times daily.      nitroGLYCERIN 0.4 MG SL tablet   Commonly known as: NITROSTAT   Place 0.4 mg under the tongue every 5 (five) minutes as needed. For chest pain. Do not exceed 3 tablets. If pain is not resolved after 3rd tablet call 911.      omeprazole 20 MG capsule   Commonly known as: PRILOSEC   Take 20 mg by mouth daily.      polyethylene glycol packet   Commonly known as: MIRALAX / GLYCOLAX   Take 17 g by mouth 2 (two) times daily.      senna 8.6 MG Tabs   Commonly known as: SENOKOT   Take 1 tablet by mouth.      zolpidem 5 MG tablet   Commonly known as: AMBIEN   Take 5-10 mg by mouth at bedtime as needed. For sleep.        Follow-up Information    Follow up with [facility]. On 08/18/2012. (  1 PM.  Arrive at the Short Stay department.)    Contact information:   [address] [place] [address] [phone]         Signed: Wilburt Finlay 08/12/2012, 12:21 PM

## 2012-08-12 NOTE — Progress Notes (Signed)
[facility]  Subjective: Up in the chair with no complaints.   Objective: Vital signs in last 24 hours: Temp:  [97.8 F (36.6 C)-98.2 F (36.8 C)] 98.2 F (36.8 C) (10/29 0500) Pulse Rate:  [58-78] 78  (10/29 0500) Resp:  [18] 18  (10/29 0500) BP: (92-112)/(37-62) 112/62 mmHg (10/29 0827) SpO2:  [99 %-100 %] 99 % (10/29 0500) Weight:  [109.861 kg (242 lb 3.2 oz)] 109.861 kg (242 lb 3.2 oz) (10/29 0500) Last BM Date: 08/11/12  Intake/Output from previous day: 10/28 0701 - 10/29 0700 In: 600 [P.O.:600] Out: 852 [Urine:850; Stool:2] Intake/Output this shift:    Medications Current Facility-Administered Medications  Medication Dose Route Frequency Provider Last Rate Last Dose  . 0.9 %  sodium chloride infusion   Intravenous Continuous Nada Boozer, NP 10 mL/hr at 07/29/12 0655    . 0.9 %  sodium chloride infusion   Intravenous Continuous Marykay Lex, MD 10 mL/hr at 07/25/12 [phone]    . acetaminophen (TYLENOL) tablet 650 mg  650 mg Oral Q4H PRN Nada Boozer, NP   650 mg at 08/02/12 2354  . albuterol (PROVENTIL HFA;VENTOLIN HFA) 108 (90 BASE) MCG/ACT inhaler 2 puff  2 puff Inhalation Q6H PRN Nada Boozer, NP   2 puff at 07/29/12 1844  . ALPRAZolam Prudy Feeler) tablet 0.25 mg  0.25 mg Oral Q8H PRN Abelino Derrick, PA   0.25 mg at 08/02/12 1728  . alum & mag hydroxide-simeth (MAALOX/MYLANTA) 200-200-20 MG/5ML suspension 30 mL  30 mL Oral Q4H PRN Nada Boozer, NP   30 mL at 07/24/12 2139  . amiodarone (PACERONE) tablet 400 mg  400 mg Oral BID Nada Boozer, NP   400 mg at 08/11/12 2118  . antiseptic oral rinse (BIOTENE) solution 15 mL  15 mL Mouth Rinse BID Chrystie Nose, MD   15 mL at 08/12/12 0800  . aspirin EC tablet 81 mg  81 mg Oral Daily Nada Boozer, NP   81 mg at 08/11/12 1027  . darbepoetin (ARANESP) injection 100 mcg  100 mcg Subcutaneous Q Sat-1800 Llana Aliment Deterding, MD      . docusate sodium (COLACE) capsule 200 mg  200 mg Oral BID Nada Boozer,  NP   200 mg at 08/10/12 1040  . ezetimibe (ZETIA) tablet 10 mg  10 mg Oral Daily Nada Boozer, NP   10 mg at 08/11/12 1027  . feeding supplement (ENSURE COMPLETE) liquid 237 mL  237 mL Oral BID BM Chrystie Nose, MD   237 mL at 08/11/12 2000  . furosemide (LASIX) tablet 80 mg  80 mg Oral BID Trevor Iha, MD   80 mg at 08/12/12 0826  . HYDROcodone-acetaminophen (NORCO/VICODIN) 5-325 MG per tablet 1-2 tablet  1-2 tablet Oral Q4H PRN Thurmon Fair, MD   2 tablet at 08/12/12 0827  . loratadine (CLARITIN) tablet 10 mg  10 mg Oral Daily Nada Boozer, NP   10 mg at 08/11/12 1027  . nitroGLYCERIN (NITROSTAT) SL tablet 0.4 mg  0.4 mg Sublingual Q5 Min x 3 PRN Nada Boozer, NP      . ondansetron [facility]) injection 4 mg  4 mg Intravenous Q6H PRN Nada Boozer, NP   4 mg at 08/09/12 1806  . polyethylene glycol (MIRALAX / GLYCOLAX) packet 17 g  17 g Oral BID Nada Boozer, NP   17 g at 08/10/12 1040  . senna (SENOKOT) tablet 8.6 mg  1 tablet Oral Daily Nada Boozer, NP   8.6 mg  at 08/10/12 1036  . traMADol (ULTRAM) tablet 50 mg  50 mg Oral Q6H PRN Abelino Derrick, PA   50 mg at 08/07/12 1843  . zolpidem (AMBIEN) tablet 5 mg  5 mg Oral QHS Eda Paschal [place], [place]   5 mg at 08/11/12 2118  . DISCONTD: cholecalciferol (VITAMIN D) tablet 2,000 Units  2,000 Units Oral Daily Nada Boozer, NP   2,000 Units at 08/11/12 1027  . DISCONTD: darbepoetin (ARANESP) injection 100 mcg  100 mcg Subcutaneous Weekly Maree Krabbe, MD   100 mcg at 08/09/12 2054  . DISCONTD: darbepoetin (ARANESP) injection 100 mcg  100 mcg Subcutaneous Q Mon-1800 Trevor Iha, MD      . DISCONTD: metoprolol succinate (TOPROL-XL) 24 hr tablet 25 mg  25 mg Oral Daily Nada Boozer, NP   25 mg at 08/11/12 1028    PE: General appearance: alert, cooperative and no distress Lungs: clear to auscultation bilaterally Heart: regular rate and rhythm and 1/6 sys MM. Extremities: Chronic Edema Pulses: 2+ and symmetric Neurologic: Grossly normal  Lab  Results:   Basename 08/12/12 0522 08/11/12 0659 08/10/12 0545  WBC 11.9* 13.6* 16.4*  HGB 9.1* 9.4* 9.4*  HCT 28.9* 29.5* 29.0*  PLT 508* 501* 490*   BMET  Basename 08/12/12 0522 08/11/12 0659 08/10/12 0545  NA 133* 129* 128*  K 5.1 5.2* 5.1  CL 89* 86* 85*  CO2 32 34* 33*  GLUCOSE 79 84 84  BUN 79* 79* 78*  CREATININE 3.76* 3.81* 3.99*  CALCIUM 9.0 8.8 8.7   PT/INR  Basename 08/12/12 0522 08/11/12 0659 08/10/12 0545  LABPROT 16.4* 16.8* 20.2*  INR 1.35 1.40 1.79*   Cholesterol No results found for this basename: CHOL in the last 72 hours Cardiac Enzymes No components found with this basename: TROPONIN:3, CKMB:3  Studies/Results: @RISRSLT2 @   Assessment/Plan  Principal Problem:  *Sustained SVT, with need for emergent DCCV 07/24/12 Active Problems:  Pulmonary embolism, Oct 2012, suspected recurrence 07/24/12  CKD (chronic kidney disease) stage 4, GFR 15-29 ml/min  Hypertension  GERD (gastroesophageal reflux disease)  Morbid obesity  Acute on chronic diastolic congestive heart failure -- secondary to rapid SVT, aFib RVR  Lymphedema  Type II or unspecified type diabetes mellitus without mention of complication, "Diet" controlled  Presence of IVC filter  Aortic stenosis, moderate; Mean gradient of by cath  PAF, recurrent 07/24/12- Amiodarone added- NSR  H/O: GI bleed, severe (re-challenge with Coumadin this admission secondary to recuurent PE with IVCF)  CAD (coronary artery disease) - Mild-moderate with ~30% RCA lesion by cath  SOB (shortness of breath)  Hypotensive episode, secondary to SVT with HR 200  Chronic diastolic heart failure grade 1, NL LVF echo Aug 2013  Pericardial effusion  Plan:   1.  Hgb stable.  INR 1.35.  BP stable.   2.  She is still having intermittent Afib, however, the rate is controlled now.  Continue Amio at 400mg  BID. 3.  Palliative Med can do consult as an out patient 4.  DC to SNF today.   5.  DNR.   LOS: 19 days     HAGER, BRYAN 08/12/2012 9:47 AM  I have seen and examined the patient along with Wilburt Finlay, PA.  I have reviewed the chart, notes and new data.  I agree with PA's note.  Note stable Hgb, INR, K and Creat levels for 72h. DC to SNF today. Reduce Amiodarone to 400 mg daily in one week. I suspect that she is  closer to uremia/ESRD than we have suspected and anticipate problems with volume and electrolyte management to become a growing issue over next few months.   Thurmon Fair, MD, [facility] [phone] 08/12/2012, 11:25 AM

## 2012-08-12 NOTE — Progress Notes (Signed)
D/c orders received;IV removed with gauze on, pt remains in stable condition, pt meds and instructions reviewed and given to pt and pt family; pt d/c to [facility] via ambulance, report called to [facility]

## 2012-08-12 NOTE — Progress Notes (Signed)
Subjective: Interval History: has complaints hungry.  Objective: Vital signs in last 24 hours: Temp:  [97.8 F (36.6 C)-98.2 F (36.8 C)] 98.2 F (36.8 C) (10/29 0500) Pulse Rate:  [58-78] 78  (10/29 0500) Resp:  [18] 18  (10/29 0500) BP: (92-112)/(37-62) 112/62 mmHg (10/29 0827) SpO2:  [99 %-100 %] 99 % (10/29 0500) Weight:  [109.861 kg (242 lb 3.2 oz)] 109.861 kg (242 lb 3.2 oz) (10/29 0500) Weight change: -3.992 kg (-8 lb 12.8 oz)  Intake/Output from previous day: 10/28 0701 - 10/29 0700 In: 600 [P.O.:600] Out: 852 [Urine:850; Stool:2] Intake/Output this shift: Total I/O In: 240 [P.O.:240] Out: 500 [Urine:500]  General appearance: alert, cooperative and slowed mentation Resp: diminished breath sounds bilaterally and rales bibasilar Cardio: S1, S2 normal and systolic murmur: holosystolic 2/6, blowing at apex GI: obese, pos bs, soft,liver down 5 cm Extremities: edema 1+  Lab Results:  Basename 08/12/12 0522 08/11/12 0659  WBC 11.9* 13.6*  HGB 9.1* 9.4*  HCT 28.9* 29.5*  PLT 508* 501*   BMET:  Basename 08/12/12 0522 08/11/12 0659  NA 133* 129*  K 5.1 5.2*  CL 89* 86*  CO2 32 34*  GLUCOSE 79 84  BUN 79* 79*  CREATININE 3.76* 3.81*  CALCIUM 9.0 8.8   No results found for this basename: PTH:2 in the last 72 hours Iron Studies: No results found for this basename: IRON,TIBC,TRANSFERRIN,FERRITIN in the last 72 hours  Studies/Results: No results found.  I have reviewed the patient's current medications.  Assessment/Plan: 1 CKD 4_5 stable. Vol improving would cont current mgmt 2 Anemia epo/fe 3 Hpth meds 4 CM  5 Debill 6 ^ lipids ???utility of tx in this setting  P cont current tx, Will s/o as nothing additional to add, conserv mgmt   LOS: 19 days   Sumayya Muha L 08/12/2012,12:14 PM

## 2012-08-12 NOTE — Progress Notes (Signed)
Clinical Social Worker met with pt at bedside; several family members present.  CSW provided support and opportunity for pt to process feelings.  Pt agreeable to [facility] prior to her dc home.  Pt would like her son, Florinda Marker, or dtr, Lowella Bandy, to make decision for rehab.  [phone] & [phone]  As CSW was leaving room, CSW received phone call from Blythe who states his sister will be paying the bill at [employer].  Charlz to phone facility to discuss further.  CSW explained that pt a potential dc for today.  Son requested dc to be held until tomorrow morning due family coming in to town tonight.  CSW explained that if dc is held after an MD medically clears someone for dc, insurance may not pay.  Florinda Marker stated understanding.  CSW to continue to follow and assist as needed.   Angelia Mould, MSW, [place] [phone]

## 2012-08-18 ENCOUNTER — Other Ambulatory Visit (HOSPITAL_COMMUNITY): Payer: Self-pay | Admitting: *Deleted

## 2012-08-18 ENCOUNTER — Ambulatory Visit (HOSPITAL_COMMUNITY)
Admit: 2012-08-18 | Discharge: 2012-08-18 | Disposition: A | Discharge status: Home / Self Care | Payer: Medicare Other | Attending: Nephrology | Admitting: Nephrology

## 2012-08-18 DIAGNOSIS — I5032 Chronic diastolic (congestive) heart failure: Secondary | ICD-10-CM | POA: Insufficient documentation

## 2012-08-18 DIAGNOSIS — E119 Type 2 diabetes mellitus without complications: Secondary | ICD-10-CM | POA: Insufficient documentation

## 2012-08-18 DIAGNOSIS — G8929 Other chronic pain: Secondary | ICD-10-CM | POA: Insufficient documentation

## 2012-08-18 DIAGNOSIS — I509 Heart failure, unspecified: Secondary | ICD-10-CM | POA: Insufficient documentation

## 2012-08-18 DIAGNOSIS — N184 Chronic kidney disease, stage 4 (severe): Secondary | ICD-10-CM | POA: Insufficient documentation

## 2012-08-18 DIAGNOSIS — I129 Hypertensive chronic kidney disease with stage 1 through stage 4 chronic kidney disease, or unspecified chronic kidney disease: Secondary | ICD-10-CM | POA: Insufficient documentation

## 2012-08-18 DIAGNOSIS — I2510 Atherosclerotic heart disease of native coronary artery without angina pectoris: Secondary | ICD-10-CM | POA: Insufficient documentation

## 2012-08-18 DIAGNOSIS — Z86711 Personal history of pulmonary embolism: Secondary | ICD-10-CM | POA: Insufficient documentation

## 2012-08-18 DIAGNOSIS — K219 Gastro-esophageal reflux disease without esophagitis: Secondary | ICD-10-CM | POA: Insufficient documentation

## 2012-08-18 DIAGNOSIS — Z8542 Personal history of malignant neoplasm of other parts of uterus: Secondary | ICD-10-CM | POA: Insufficient documentation

## 2012-08-18 DIAGNOSIS — D631 Anemia in chronic kidney disease: Secondary | ICD-10-CM | POA: Insufficient documentation

## 2012-08-18 DIAGNOSIS — N039 Chronic nephritic syndrome with unspecified morphologic changes: Secondary | ICD-10-CM | POA: Insufficient documentation

## 2012-08-18 LAB — POCT HEMOGLOBIN-HEMACUE: Hemoglobin: 11 g/dL — ABNORMAL LOW (ref 12.0–15.0)

## 2012-08-18 MED ORDER — DARBEPOETIN ALFA-POLYSORBATE 150 MCG/0.3ML IJ SOLN: 150.0000 ug | INTRAMUSCULAR | Status: DC

## 2012-09-02 ENCOUNTER — Other Ambulatory Visit (HOSPITAL_COMMUNITY): Payer: Self-pay | Admitting: *Deleted

## 2012-09-03 ENCOUNTER — Encounter (HOSPITAL_COMMUNITY)
Admission: RE | Admit: 2012-09-03 | Discharge: 2012-09-03 | Disposition: A | Discharge status: Home / Self Care | Payer: Medicare Other | Source: Ambulatory Visit | Attending: Nephrology | Admitting: Nephrology

## 2012-09-03 DIAGNOSIS — N039 Chronic nephritic syndrome with unspecified morphologic changes: Secondary | ICD-10-CM | POA: Insufficient documentation

## 2012-09-03 DIAGNOSIS — N184 Chronic kidney disease, stage 4 (severe): Secondary | ICD-10-CM | POA: Insufficient documentation

## 2012-09-03 DIAGNOSIS — D631 Anemia in chronic kidney disease: Secondary | ICD-10-CM | POA: Insufficient documentation

## 2012-09-03 LAB — POCT HEMOGLOBIN-HEMACUE: Hemoglobin: 10.7 g/dL — ABNORMAL LOW (ref 12.0–15.0)

## 2012-09-03 MED ORDER — DARBEPOETIN ALFA-POLYSORBATE 150 MCG/0.3ML IJ SOLN
150.0000 ug | INTRAMUSCULAR | Status: DC
  Administered 2012-09-03: 150 ug via SUBCUTANEOUS

## 2012-09-03 MED ORDER — DARBEPOETIN ALFA-POLYSORBATE 150 MCG/0.3ML IJ SOLN
INTRAMUSCULAR | Status: AC
  Filled 2012-09-03: qty 0.3

## 2012-09-17 ENCOUNTER — Ambulatory Visit: Payer: Medicare Other | Admitting: Internal Medicine

## 2012-09-17 ENCOUNTER — Encounter (HOSPITAL_COMMUNITY)
Admission: RE | Admit: 2012-09-17 | Discharge: 2012-09-17 | Disposition: A | Discharge status: Home / Self Care | Payer: Medicare Other | Source: Ambulatory Visit | Attending: Nephrology | Admitting: Nephrology

## 2012-09-17 DIAGNOSIS — N039 Chronic nephritic syndrome with unspecified morphologic changes: Secondary | ICD-10-CM | POA: Insufficient documentation

## 2012-09-17 DIAGNOSIS — D631 Anemia in chronic kidney disease: Secondary | ICD-10-CM | POA: Insufficient documentation

## 2012-09-17 DIAGNOSIS — N184 Chronic kidney disease, stage 4 (severe): Secondary | ICD-10-CM | POA: Insufficient documentation

## 2012-09-17 LAB — IRON AND TIBC
Iron: 39 ug/dL — ABNORMAL LOW (ref 42–135)
UIBC: 139 ug/dL (ref 125–400)

## 2012-09-17 LAB — FERRITIN: Ferritin: 1322 ng/mL — ABNORMAL HIGH (ref 10–291)

## 2012-09-17 LAB — POCT HEMOGLOBIN-HEMACUE: Hemoglobin: 10.6 g/dL — ABNORMAL LOW (ref 12.0–15.0)

## 2012-09-17 MED ORDER — DARBEPOETIN ALFA-POLYSORBATE 150 MCG/0.3ML IJ SOLN
INTRAMUSCULAR | Status: AC
  Administered 2012-09-17: 150 ug via SUBCUTANEOUS
  Filled 2012-09-17: qty 0.3

## 2012-09-17 MED ORDER — DARBEPOETIN ALFA-POLYSORBATE 150 MCG/0.3ML IJ SOLN: 150.0000 ug | INTRAMUSCULAR | Status: DC

## 2012-10-01 ENCOUNTER — Encounter (HOSPITAL_COMMUNITY)
Admission: RE | Admit: 2012-10-01 | Discharge: 2012-10-01 | Disposition: A | Discharge status: Home / Self Care | Payer: Medicare Other | Source: Ambulatory Visit | Attending: Nephrology | Admitting: Nephrology

## 2012-10-01 LAB — POCT HEMOGLOBIN-HEMACUE: Hemoglobin: 11.3 g/dL — ABNORMAL LOW (ref 12.0–15.0)

## 2012-10-01 MED ORDER — DARBEPOETIN ALFA-POLYSORBATE 150 MCG/0.3ML IJ SOLN
150.0000 ug | INTRAMUSCULAR | Status: DC
Start: 2012-10-01 — End: 2012-10-02
  Administered 2012-10-01: 150 ug via SUBCUTANEOUS

## 2012-10-01 MED ORDER — DARBEPOETIN ALFA-POLYSORBATE 100 MCG/0.5ML IJ SOLN
INTRAMUSCULAR | Status: AC
  Filled 2012-10-01: qty 0.5

## 2012-10-16 ENCOUNTER — Encounter (HOSPITAL_COMMUNITY)
Admission: RE | Admit: 2012-10-16 | Discharge: 2012-10-16 | Disposition: A | Discharge status: Home / Self Care | Payer: Medicare Other | Source: Ambulatory Visit | Attending: Nephrology | Admitting: Nephrology

## 2012-10-16 DIAGNOSIS — N039 Chronic nephritic syndrome with unspecified morphologic changes: Secondary | ICD-10-CM | POA: Insufficient documentation

## 2012-10-16 DIAGNOSIS — N184 Chronic kidney disease, stage 4 (severe): Secondary | ICD-10-CM | POA: Insufficient documentation

## 2012-10-16 DIAGNOSIS — D631 Anemia in chronic kidney disease: Secondary | ICD-10-CM | POA: Insufficient documentation

## 2012-10-16 LAB — IRON AND TIBC: Iron: 45 ug/dL (ref 42–135)

## 2012-10-16 MED ORDER — DARBEPOETIN ALFA-POLYSORBATE 150 MCG/0.3ML IJ SOLN
INTRAMUSCULAR | Status: AC
  Filled 2012-10-16: qty 0.3

## 2012-10-16 MED ORDER — DARBEPOETIN ALFA-POLYSORBATE 150 MCG/0.3ML IJ SOLN
150.0000 ug | INTRAMUSCULAR | Status: DC
  Administered 2012-10-16: 150 ug via SUBCUTANEOUS

## 2012-10-17 LAB — FERRITIN: Ferritin: 1834 ng/mL — ABNORMAL HIGH (ref 10–291)

## 2012-10-29 ENCOUNTER — Encounter (HOSPITAL_COMMUNITY)
Admission: RE | Admit: 2012-10-29 | Discharge: 2012-10-29 | Disposition: A | Discharge status: Home / Self Care | Payer: Medicare Other | Source: Ambulatory Visit | Attending: Nephrology | Admitting: Nephrology

## 2012-10-29 MED ORDER — DARBEPOETIN ALFA-POLYSORBATE 150 MCG/0.3ML IJ SOLN
150.0000 ug | INTRAMUSCULAR | Status: DC
Start: 2012-10-29 — End: 2012-10-30

## 2012-11-05 ENCOUNTER — Ambulatory Visit: Payer: Medicare Other | Admitting: Internal Medicine

## 2012-11-05 DIAGNOSIS — Z0289 Encounter for other administrative examinations: Secondary | ICD-10-CM

## 2012-11-12 ENCOUNTER — Encounter (HOSPITAL_COMMUNITY): Payer: Medicare Other

## 2012-11-19 ENCOUNTER — Encounter (HOSPITAL_COMMUNITY): Payer: Medicare Other

## 2012-11-25 ENCOUNTER — Other Ambulatory Visit (HOSPITAL_COMMUNITY): Payer: Self-pay

## 2012-11-25 ENCOUNTER — Other Ambulatory Visit (HOSPITAL_COMMUNITY): Payer: Self-pay | Admitting: *Deleted

## 2012-11-26 ENCOUNTER — Inpatient Hospital Stay (HOSPITAL_COMMUNITY): Admission: RE | Admit: 2012-11-26 | Payer: Medicare Other | Source: Ambulatory Visit

## 2012-11-26 ENCOUNTER — Encounter (HOSPITAL_COMMUNITY): Payer: Medicare Other

## 2012-11-28 ENCOUNTER — Other Ambulatory Visit (HOSPITAL_COMMUNITY): Payer: Self-pay | Admitting: *Deleted

## 2012-12-01 ENCOUNTER — Encounter (HOSPITAL_COMMUNITY)
Admission: RE | Admit: 2012-12-01 | Discharge: 2012-12-01 | Disposition: A | Discharge status: Home / Self Care | Payer: Medicare Other | Source: Ambulatory Visit | Attending: Nephrology | Admitting: Nephrology

## 2012-12-01 DIAGNOSIS — N184 Chronic kidney disease, stage 4 (severe): Secondary | ICD-10-CM | POA: Insufficient documentation

## 2012-12-01 DIAGNOSIS — D631 Anemia in chronic kidney disease: Secondary | ICD-10-CM | POA: Insufficient documentation

## 2012-12-01 LAB — POCT HEMOGLOBIN-HEMACUE: Hemoglobin: 12.8 g/dL (ref 12.0–15.0)

## 2012-12-01 LAB — IRON AND TIBC: Saturation Ratios: 36 % (ref 20–55)

## 2012-12-01 MED ORDER — DARBEPOETIN ALFA-POLYSORBATE 150 MCG/0.3ML IJ SOLN: 150.0000 ug | INTRAMUSCULAR | Status: DC

## 2012-12-10 ENCOUNTER — Encounter: Payer: Self-pay | Admitting: Internal Medicine

## 2012-12-10 ENCOUNTER — Ambulatory Visit (INDEPENDENT_AMBULATORY_CARE_PROVIDER_SITE_OTHER): Payer: Medicare Other | Admitting: Internal Medicine

## 2012-12-10 VITALS — BP 92/62 | HR 66 | Temp 97.0°F | Wt 189.0 lb

## 2012-12-10 DIAGNOSIS — R0902 Hypoxemia: Secondary | ICD-10-CM

## 2012-12-10 NOTE — Assessment & Plan Note (Signed)
With mult co-morbids and hypoxemia with ambulation about 30 ft only in the office today (and that with near max assist), will cont to follow

## 2012-12-10 NOTE — Assessment & Plan Note (Signed)
stable overall by history and exam, recent data reviewed with pt, and pt to continue medical treatment as before,  to f/u any worsening symptoms or concerns Lab Results  Component Value Date   HGBA1C 4.8 07/24/2012

## 2012-12-10 NOTE — Progress Notes (Signed)
Subjective:    Patient ID: Christine [name], female    DOB: August 25, 1931, 79 y.o.   MRN: [id number]  HPI  Here to f/u, has spent extended time in [facility] rehab post late October 2013 hosp d/c, now home for only 2 days, but needs documentation for exertional hypoxemia as outpt today in order to resume her o2 tx; has been getting 2L o2 continuous during rehab, has required o2 since her PE.  Pt denies chest pain, increased sob or doe, wheezing, orthopnea, PND, increased LE swelling, palpitations, dizziness or syncope.   Pt denies fever, wt loss, night sweats, loss of appetite, or other constitutional symptoms   Pt denies polydipsia, polyuria  Past Medical History  Diagnosis Date  . GERD (gastroesophageal reflux disease)   . Hypertension   . Morbid obesity   . Arthritis   . Chronic kidney disease     nephrolithiasis left kidney,s/p open resection  . Status post arthroscopic surgery of right knee   . Chronic back pain   . Pneumonia 07/2011    hx  . Pulmonary embolism 07/2011    hx  . Uterine cancer     endometrial high grade adenocarcinoma  . Uterus cancer, sarcoma     high grade invasive carcinosarcoma  . Myocardial infarction 1989  . Bronchitis   . H/O cardiomegaly   . Lymphedema 09/29/2011  . Diabetes mellitus, diet controlled 10/03/2011  . Chronic kidney disease, stage II (mild) 09/20/2011  . Acute diastolic congestive heart failure, 2D Oct 2012 09/29/2011  . Presence of IVC filter 01/08/2012  . History of colon polyps 01/08/2012  . Chest pain 01/16/2012  . Aortic stenosis, moderate 01/16/2012  . PAF (paroxysmal atrial fibrillation), 01/18/12 01/18/2012  . CAD (coronary artery disease) 03/14/2012  . Nephrolithiasis 03/14/2012  . Diverticulosis 03/14/2012  . Allergic rhinitis, cause unspecified 05/04/2012  . Gout 06/25/2012  . Radiation 02/03/2009    4500 cGy 25 fx iridium-192/uterine  . Sustained SVT, with need for emergent DCCV 07/24/12 07/24/2012  . SOB (shortness of breath) 07/24/2012  .  Hypotensive episode, secondary to SVT with HR 200 07/24/2012  . Respiratory failure, acute, secondary to tachycardia, diastolic HF 07/24/2012  . Pericardial effusion 08/11/2012   Past Surgical History  Procedure Laterality Date  . Colonoscopy      multiple  . Knee arthroscopy      right knee  . Cataract extraction, bilateral    . Tubal ligation  1973  . Abdominal hysterectomy  10/05/2008    total with b/l salpingo-oopherectomy  . Kidney stone removal    . Open left knee cartilage repair    . Left index finger surgery    . Cardiac catheterization      reports that she has never smoked. She has never used smokeless tobacco. She reports that she does not drink alcohol or use illicit drugs. family history includes Breast cancer in her sister; Cervical cancer in her sister; and Colon cancer in her sister. No Known Allergies Current Outpatient Prescriptions on File Prior to Visit  Medication Sig Dispense Refill  . acetaminophen (TYLENOL) 500 MG tablet Take 500 mg by mouth every 6 (six) hours as needed. For pain      . albuterol (PROVENTIL HFA;VENTOLIN HFA) 108 (90 BASE) MCG/ACT inhaler Inhale 2 puffs into the lungs every 6 (six) hours as needed. For shortness of breath      . amiodarone (PACERONE) 400 MG tablet Take 1 tablet (400 mg total) by mouth 2 (two) times daily.  60 tablet  5  . aspirin EC 81 MG tablet Take 81 mg by mouth daily.      . cholecalciferol (VITAMIN D) 1000 UNITS tablet Take 2,000 Units by mouth daily.       . darbepoetin (ARANESP) 100 MCG/0.5ML SOLN Inject 0.5 mLs (100 mcg total) into the skin every Saturday at 6 PM.  4.2 mL  3  . docusate sodium (COLACE) 100 MG capsule Take 200 mg by mouth 2 (two) times daily. For stool softner      . ezetimibe (ZETIA) 10 MG tablet Take 10 mg by mouth daily.       . furosemide (LASIX) 80 MG tablet Take 80 mg by mouth 2 (two) times daily.      . nitroGLYCERIN (NITROSTAT) 0.4 MG SL tablet Place 0.4 mg under the tongue every 5 (five)  minutes as needed. For chest pain. Do not exceed 3 tablets. If pain is not resolved after 3rd tablet call 911.      . Nutritional Supplements (ENSURE PO) Take 1 each by mouth 2 (two) times daily as needed. For supplement      . omeprazole (PRILOSEC) 20 MG capsule Take 20 mg by mouth daily.      . polyethylene glycol (MIRALAX / GLYCOLAX) packet Take 17 g by mouth 2 (two) times daily.       [name] senna (SENOKOT) 8.6 MG TABS Take 1 tablet by mouth.      . zolpidem (AMBIEN) 5 MG tablet Take 5-10 mg by mouth at bedtime as needed. For sleep.       No current facility-administered medications on file prior to visit.   Review of Systems  Constitutional: Negative for unexpected weight change, or unusual diaphoresis  HENT: Negative for tinnitus.   Eyes: Negative for photophobia and visual disturbance.  Respiratory: Negative for choking and stridor.   Gastrointestinal: Negative for vomiting and blood in stool.  Genitourinary: Negative for hematuria and decreased urine volume.  Musculoskeletal: Negative for acute joint swelling Skin: Negative for color change and wound.  Neurological: Negative for tremors and numbness other than noted  Psychiatric/Behavioral: Negative for decreased concentration or  hyperactivity.       Objective:   Physical Exam BP 92/62  Pulse 66  Temp(Src) 97 F (36.1 C) (Oral)  Wt 189 lb (85.73 kg)  BMI 30.52 kg/m2  SpO2 99% VS noted,  O2 sat 86% on RA with near max assist with walking 30 ft only Constitutional: Pt appears well-developed and well-nourished.  HENT: Head: NCAT.  Right Ear: External ear normal.  Left Ear: External ear normal.  Eyes: Conjunctivae and EOM are normal. Pupils are equal, round, and reactive to light.  Neck: Normal range of motion. Neck supple.  Cardiovascular: Normal rate and regular rhythm.   Pulmonary/Chest: Effort normal and breath sounds normal.  Abd:  Soft, NT, non-distended, + BS Neurological: Pt is alert. Not confused , Skin: Skin is  warm. No erythema. trace edema bilat LE's Psychiatric: Pt behavior is normal. Thought content normal.     Assessment & Plan:

## 2012-12-10 NOTE — Assessment & Plan Note (Signed)
Certainly qualifies for Home o2, for 2L continuous, to [facility] as [facility] no longer works with her insurance

## 2012-12-10 NOTE — Assessment & Plan Note (Signed)
stable overall by history and exam, recent data reviewed with pt, and pt to continue medical treatment as before,  to f/u any worsening symptoms or concerns BP Readings from Last 3 Encounters:  12/10/12 92/62  12/01/12 92/70  10/29/12 95/70

## 2012-12-10 NOTE — Patient Instructions (Addendum)
Your oxygen fell with walking today, so we should continue the oxygen with exertion at 2L (we will notify [facility]) - Please see the [facility] before leaving today Please continue all other medications as before Please have the pharmacy call with any other refills you may need. We can hold on blood testing today Please keep your appointments with your specialists as you have planned  Please continue all other medications as before Thank you for enrolling in MyChart. Please follow the instructions below to securely access your online medical record. MyChart allows you to send messages to your doctor, view your test results, renew your prescriptions, schedule appointments, and more. To Log into My Chart online, please go by [employer] or [employer] to [email or web address], or download the MyChart App from the [employer] .  Your Username is:  Research scientist (life sciences) (password - [email or web address]) (pet - [email or web address]) Please return in 6 months, or sooner if needed

## 2012-12-16 ENCOUNTER — Telehealth: Payer: Self-pay

## 2012-12-16 NOTE — Telephone Encounter (Signed)
Hospice informed  

## 2012-12-16 NOTE — Telephone Encounter (Signed)
Yes, ok for hospice, and ok for Hospice MD

## 2012-12-16 NOTE — Telephone Encounter (Signed)
[facility] called informing the patients son has requested Hospice care for this patient. Please advise if ok and if you would be attending physician for this patient or hospice MD.  Call back number [phone] [facility]).

## 2012-12-17 ENCOUNTER — Encounter (HOSPITAL_COMMUNITY): Payer: Medicare Other

## 2012-12-24 ENCOUNTER — Telehealth: Payer: Self-pay | Admitting: Internal Medicine

## 2012-12-24 MED ORDER — MIRTAZAPINE 15 MG PO TABS: 15.0000 mg | ORAL_TABLET | Freq: Every day | ORAL | Status: AC

## 2012-12-24 NOTE — Telephone Encounter (Signed)
Pt did not bring home her DNR from the nursing home.  Will it be ok for the Hospice doctor to sign one for her.   She was put on remeron 15 mg at the nursing home.  She needs a new RX for this.  The pharmacy is [facility] on [address]  She also needs an Rx for Amiodarone 200 mg.  Should she still take it twice a day?

## 2012-12-24 NOTE — Telephone Encounter (Signed)
Ok for hospice MD DNR  Ok for remeron - done erx  I normally dont prescribed the amiodarone; does she have a cardiologist?

## 2012-12-25 NOTE — Telephone Encounter (Signed)
[facility] informed of MD response to all information requested

## 2012-12-31 ENCOUNTER — Encounter (HOSPITAL_COMMUNITY)
Admission: RE | Admit: 2012-12-31 | Discharge: 2012-12-31 | Disposition: A | Discharge status: Home / Self Care | Source: Ambulatory Visit | Attending: Nephrology | Admitting: Nephrology

## 2012-12-31 DIAGNOSIS — N184 Chronic kidney disease, stage 4 (severe): Secondary | ICD-10-CM | POA: Insufficient documentation

## 2012-12-31 DIAGNOSIS — D631 Anemia in chronic kidney disease: Secondary | ICD-10-CM | POA: Insufficient documentation

## 2012-12-31 MED ORDER — DARBEPOETIN ALFA-POLYSORBATE 150 MCG/0.3ML IJ SOLN: 150.0000 ug | INTRAMUSCULAR | Status: DC

## 2013-01-01 LAB — IRON AND TIBC
Iron: 93 ug/dL (ref 42–135)
UIBC: 82 ug/dL — ABNORMAL LOW (ref 125–400)

## 2013-01-14 ENCOUNTER — Encounter (HOSPITAL_COMMUNITY): Payer: Medicare Other

## 2013-02-04 ENCOUNTER — Encounter (HOSPITAL_COMMUNITY): Payer: Medicare Other

## 2013-02-10 ENCOUNTER — Telehealth: Payer: Self-pay | Admitting: Internal Medicine

## 2013-02-10 MED ORDER — OMEPRAZOLE 20 MG PO CPDR: 20.0000 mg | DELAYED_RELEASE_CAPSULE | Freq: Every day | ORAL | Status: AC

## 2013-02-10 MED ORDER — DICLOFENAC SODIUM 1 % TD GEL: 4.0000 g | Freq: Four times a day (QID) | TRANSDERMAL | Status: AC

## 2013-02-10 NOTE — Telephone Encounter (Signed)
Ok for both - to robin to handle

## 2013-02-10 NOTE — Telephone Encounter (Signed)
Wendy-hospice nurse for patient calls requesting the following orders: 1.  Approval for a protein pump inhibitor from over the counter like Nexium or Prilosec rather than prescriptive strength which costs $180 per bottle.  Patient is stable with no complaints of heartburn or other GI complaints.  2.  Also requesting a prescription for Voltaren Gel be called in for patient at [facility] in [facility] on [address].  Patient suffers with severe arthritis of which this medication helps quite a bit-initially ordered while she was in the hospital.  Please follow up with Toniann Fail regarding these request.

## 2013-02-10 NOTE — Telephone Encounter (Signed)
[facility] informed of MD ok sent gel to pharmacy.  Please advise on Gel

## 2013-02-10 NOTE — Telephone Encounter (Signed)
rx done per emr 

## 2013-02-11 ENCOUNTER — Emergency Department (HOSPITAL_COMMUNITY)

## 2013-02-11 ENCOUNTER — Encounter (HOSPITAL_COMMUNITY)
Admission: RE | Admit: 2013-02-11 | Discharge: 2013-02-11 | Disposition: A | Discharge status: Home / Self Care | Source: Ambulatory Visit | Attending: Nephrology | Admitting: Nephrology

## 2013-02-11 ENCOUNTER — Encounter (HOSPITAL_COMMUNITY): Payer: Medicare Other

## 2013-02-11 ENCOUNTER — Encounter (HOSPITAL_COMMUNITY): Payer: Self-pay | Admitting: Emergency Medicine

## 2013-02-11 ENCOUNTER — Other Ambulatory Visit: Payer: Self-pay

## 2013-02-11 ENCOUNTER — Inpatient Hospital Stay (HOSPITAL_COMMUNITY)
Admission: EM | Admit: 2013-02-11 | Discharge: 2013-02-14 | DRG: 872 | Disposition: A | Discharge status: Hospice | Attending: Internal Medicine | Admitting: Internal Medicine

## 2013-02-11 DIAGNOSIS — R112 Nausea with vomiting, unspecified: Secondary | ICD-10-CM | POA: Diagnosis present

## 2013-02-11 DIAGNOSIS — G8929 Other chronic pain: Secondary | ICD-10-CM | POA: Diagnosis present

## 2013-02-11 DIAGNOSIS — N185 Chronic kidney disease, stage 5: Secondary | ICD-10-CM | POA: Diagnosis present

## 2013-02-11 DIAGNOSIS — D631 Anemia in chronic kidney disease: Secondary | ICD-10-CM | POA: Insufficient documentation

## 2013-02-11 DIAGNOSIS — Z79899 Other long term (current) drug therapy: Secondary | ICD-10-CM

## 2013-02-11 DIAGNOSIS — I5032 Chronic diastolic (congestive) heart failure: Secondary | ICD-10-CM | POA: Diagnosis present

## 2013-02-11 DIAGNOSIS — J309 Allergic rhinitis, unspecified: Secondary | ICD-10-CM | POA: Diagnosis present

## 2013-02-11 DIAGNOSIS — Z6829 Body mass index (BMI) 29.0-29.9, adult: Secondary | ICD-10-CM

## 2013-02-11 DIAGNOSIS — M129 Arthropathy, unspecified: Secondary | ICD-10-CM | POA: Diagnosis present

## 2013-02-11 DIAGNOSIS — N039 Chronic nephritic syndrome with unspecified morphologic changes: Secondary | ICD-10-CM | POA: Diagnosis present

## 2013-02-11 DIAGNOSIS — M549 Dorsalgia, unspecified: Secondary | ICD-10-CM | POA: Diagnosis present

## 2013-02-11 DIAGNOSIS — I2510 Atherosclerotic heart disease of native coronary artery without angina pectoris: Secondary | ICD-10-CM | POA: Diagnosis present

## 2013-02-11 DIAGNOSIS — N184 Chronic kidney disease, stage 4 (severe): Secondary | ICD-10-CM | POA: Diagnosis present

## 2013-02-11 DIAGNOSIS — I252 Old myocardial infarction: Secondary | ICD-10-CM

## 2013-02-11 DIAGNOSIS — E860 Dehydration: Secondary | ICD-10-CM | POA: Diagnosis present

## 2013-02-11 DIAGNOSIS — I959 Hypotension, unspecified: Secondary | ICD-10-CM | POA: Diagnosis present

## 2013-02-11 DIAGNOSIS — Z86711 Personal history of pulmonary embolism: Secondary | ICD-10-CM

## 2013-02-11 DIAGNOSIS — I350 Nonrheumatic aortic (valve) stenosis: Secondary | ICD-10-CM | POA: Diagnosis present

## 2013-02-11 DIAGNOSIS — I120 Hypertensive chronic kidney disease with stage 5 chronic kidney disease or end stage renal disease: Secondary | ICD-10-CM | POA: Diagnosis present

## 2013-02-11 DIAGNOSIS — Z66 Do not resuscitate: Secondary | ICD-10-CM | POA: Diagnosis present

## 2013-02-11 DIAGNOSIS — R531 Weakness: Secondary | ICD-10-CM | POA: Diagnosis present

## 2013-02-11 DIAGNOSIS — Z7982 Long term (current) use of aspirin: Secondary | ICD-10-CM

## 2013-02-11 DIAGNOSIS — K219 Gastro-esophageal reflux disease without esophagitis: Secondary | ICD-10-CM | POA: Diagnosis present

## 2013-02-11 DIAGNOSIS — I951 Orthostatic hypotension: Secondary | ICD-10-CM

## 2013-02-11 DIAGNOSIS — I5033 Acute on chronic diastolic (congestive) heart failure: Secondary | ICD-10-CM

## 2013-02-11 DIAGNOSIS — R5381 Other malaise: Secondary | ICD-10-CM

## 2013-02-11 DIAGNOSIS — A419 Sepsis, unspecified organism: Principal | ICD-10-CM | POA: Diagnosis present

## 2013-02-11 DIAGNOSIS — N390 Urinary tract infection, site not specified: Secondary | ICD-10-CM | POA: Diagnosis present

## 2013-02-11 DIAGNOSIS — N179 Acute kidney failure, unspecified: Secondary | ICD-10-CM | POA: Diagnosis present

## 2013-02-11 DIAGNOSIS — I509 Heart failure, unspecified: Secondary | ICD-10-CM | POA: Diagnosis present

## 2013-02-11 DIAGNOSIS — Z7401 Bed confinement status: Secondary | ICD-10-CM

## 2013-02-11 DIAGNOSIS — I4891 Unspecified atrial fibrillation: Secondary | ICD-10-CM | POA: Diagnosis present

## 2013-02-11 DIAGNOSIS — Z8542 Personal history of malignant neoplasm of other parts of uterus: Secondary | ICD-10-CM

## 2013-02-11 DIAGNOSIS — Z515 Encounter for palliative care: Secondary | ICD-10-CM

## 2013-02-11 DIAGNOSIS — E875 Hyperkalemia: Secondary | ICD-10-CM | POA: Diagnosis present

## 2013-02-11 DIAGNOSIS — Z923 Personal history of irradiation: Secondary | ICD-10-CM

## 2013-02-11 DIAGNOSIS — I359 Nonrheumatic aortic valve disorder, unspecified: Secondary | ICD-10-CM | POA: Diagnosis present

## 2013-02-11 HISTORY — DX: Encounter for palliative care: Z51.5

## 2013-02-11 LAB — URINALYSIS, ROUTINE W REFLEX MICROSCOPIC
Nitrite: NEGATIVE
Specific Gravity, Urine: 1.017 (ref 1.005–1.030)
Urobilinogen, UA: 1 mg/dL (ref 0.0–1.0)
pH: 7 (ref 5.0–8.0)

## 2013-02-11 LAB — PROTIME-INR: Prothrombin Time: 14 seconds (ref 11.6–15.2)

## 2013-02-11 LAB — CBC WITH DIFFERENTIAL/PLATELET
Eosinophils Absolute: 0 10*3/uL (ref 0.0–0.7)
Eosinophils Relative: 0 % (ref 0–5)
HCT: 33.2 % — ABNORMAL LOW (ref 36.0–46.0)
Lymphs Abs: 1.7 10*3/uL (ref 0.7–4.0)
MCH: 32.3 pg (ref 26.0–34.0)
MCV: 92.5 fL (ref 78.0–100.0)
Monocytes Absolute: 0.8 10*3/uL (ref 0.1–1.0)
Monocytes Relative: 9 % (ref 3–12)
Platelets: 290 10*3/uL (ref 150–400)
RBC: 3.59 MIL/uL — ABNORMAL LOW (ref 3.87–5.11)

## 2013-02-11 LAB — COMPREHENSIVE METABOLIC PANEL
BUN: 97 mg/dL — ABNORMAL HIGH (ref 6–23)
Calcium: 8.9 mg/dL (ref 8.4–10.5)
Creatinine, Ser: 4.98 mg/dL — ABNORMAL HIGH (ref 0.50–1.10)
GFR calc Af Amer: 9 mL/min — ABNORMAL LOW (ref >90)
GFR calc non Af Amer: 7 mL/min — ABNORMAL LOW (ref >90)
Glucose, Bld: 169 mg/dL — ABNORMAL HIGH (ref 70–99)
Sodium: 134 mEq/L — ABNORMAL LOW (ref 135–145)
Total Protein: 7.7 g/dL (ref 6.0–8.3)

## 2013-02-11 LAB — IRON AND TIBC
Saturation Ratios: 37 % (ref 20–55)
TIBC: 182 ug/dL — ABNORMAL LOW (ref 250–470)
UIBC: 115 ug/dL — ABNORMAL LOW (ref 125–400)

## 2013-02-11 LAB — CBC
HCT: 32.3 % — ABNORMAL LOW (ref 36.0–46.0)
Hemoglobin: 11.3 g/dL — ABNORMAL LOW (ref 12.0–15.0)
MCH: 32.2 pg (ref 26.0–34.0)
MCV: 92 fL (ref 78.0–100.0)
RBC: 3.51 MIL/uL — ABNORMAL LOW (ref 3.87–5.11)
WBC: 7.6 10*3/uL (ref 4.0–10.5)

## 2013-02-11 LAB — POCT HEMOGLOBIN-HEMACUE: Hemoglobin: 10.7 g/dL — ABNORMAL LOW (ref 12.0–15.0)

## 2013-02-11 LAB — LACTIC ACID, PLASMA: Lactic Acid, Venous: 3 mmol/L — ABNORMAL HIGH (ref 0.5–2.2)

## 2013-02-11 LAB — URINE MICROSCOPIC-ADD ON

## 2013-02-11 LAB — CREATININE, SERUM: GFR calc Af Amer: 9 mL/min — ABNORMAL LOW (ref >90)

## 2013-02-11 MED ORDER — AMIODARONE HCL 200 MG PO TABS
400.0000 mg | ORAL_TABLET | Freq: Two times a day (BID) | ORAL | Status: DC
  Administered 2013-02-12: 400 mg via ORAL
  Filled 2013-02-11 (×2): qty 2

## 2013-02-11 MED ORDER — DARBEPOETIN ALFA-POLYSORBATE 100 MCG/0.5ML IJ SOLN: 100.0000 ug | INTRAMUSCULAR | Status: DC

## 2013-02-11 MED ORDER — ONDANSETRON HCL 4 MG/2ML IJ SOLN
4.0000 mg | Freq: Three times a day (TID) | INTRAMUSCULAR | Status: AC | PRN
  Filled 2013-02-11: qty 2

## 2013-02-11 MED ORDER — NITROGLYCERIN 0.4 MG SL SUBL: 0.4000 mg | SUBLINGUAL_TABLET | SUBLINGUAL | Status: DC | PRN

## 2013-02-11 MED ORDER — DARBEPOETIN ALFA-POLYSORBATE 150 MCG/0.3ML IJ SOLN
150.0000 ug | INTRAMUSCULAR | Status: DC
  Administered 2013-02-11: 150 ug via SUBCUTANEOUS

## 2013-02-11 MED ORDER — HEPARIN SODIUM (PORCINE) 5000 UNIT/ML IJ SOLN
5000.0000 [IU] | Freq: Three times a day (TID) | INTRAMUSCULAR | Status: DC
  Administered 2013-02-11 – 2013-02-14 (×8): 5000 [IU] via SUBCUTANEOUS
  Filled 2013-02-11 (×11): qty 1

## 2013-02-11 MED ORDER — HYDROCORTISONE ACETATE 25 MG RE SUPP
25.0000 mg | Freq: Two times a day (BID) | RECTAL | Status: DC
  Administered 2013-02-11 – 2013-02-14 (×5): 25 mg via RECTAL
  Filled 2013-02-11 (×7): qty 1

## 2013-02-11 MED ORDER — ONDANSETRON HCL 4 MG PO TABS: 4.0000 mg | ORAL_TABLET | Freq: Four times a day (QID) | ORAL | Status: DC | PRN

## 2013-02-11 MED ORDER — ACETAMINOPHEN 650 MG RE SUPP: 650.0000 mg | Freq: Four times a day (QID) | RECTAL | Status: DC | PRN

## 2013-02-11 MED ORDER — DEXTROSE 5 % IV SOLN
1.0000 g | Freq: Once | INTRAVENOUS | Status: DC
  Filled 2013-02-11: qty 10

## 2013-02-11 MED ORDER — POLYETHYLENE GLYCOL 3350 17 G PO PACK
17.0000 g | PACK | Freq: Two times a day (BID) | ORAL | Status: DC
  Administered 2013-02-12 – 2013-02-13 (×3): 17 g via ORAL
  Filled 2013-02-11 (×7): qty 1

## 2013-02-11 MED ORDER — ALBUTEROL SULFATE HFA 108 (90 BASE) MCG/ACT IN AERS
2.0000 | INHALATION_SPRAY | Freq: Four times a day (QID) | RESPIRATORY_TRACT | Status: DC | PRN
  Filled 2013-02-11: qty 6.7

## 2013-02-11 MED ORDER — ACETAMINOPHEN 325 MG PO TABS
650.0000 mg | ORAL_TABLET | Freq: Four times a day (QID) | ORAL | Status: DC | PRN
  Administered 2013-02-12 – 2013-02-13 (×3): 650 mg via ORAL
  Filled 2013-02-11 (×3): qty 2

## 2013-02-11 MED ORDER — ZOLPIDEM TARTRATE 5 MG PO TABS
5.0000 mg | ORAL_TABLET | Freq: Every evening | ORAL | Status: DC | PRN
  Administered 2013-02-11 – 2013-02-13 (×3): 5 mg via ORAL
  Filled 2013-02-11 (×3): qty 1

## 2013-02-11 MED ORDER — ONDANSETRON HCL 4 MG/2ML IJ SOLN
4.0000 mg | Freq: Four times a day (QID) | INTRAMUSCULAR | Status: DC | PRN
  Administered 2013-02-11 – 2013-02-13 (×2): 4 mg via INTRAVENOUS
  Filled 2013-02-11: qty 2

## 2013-02-11 MED ORDER — MORPHINE SULFATE 2 MG/ML IJ SOLN: 1.0000 mg | INTRAMUSCULAR | Status: DC | PRN

## 2013-02-11 MED ORDER — ASPIRIN EC 81 MG PO TBEC
81.0000 mg | DELAYED_RELEASE_TABLET | Freq: Every day | ORAL | Status: DC
  Administered 2013-02-11 – 2013-02-14 (×4): 81 mg via ORAL
  Filled 2013-02-11 (×4): qty 1

## 2013-02-11 MED ORDER — SODIUM CHLORIDE 0.9 % IV SOLN
INTRAVENOUS | Status: AC
  Administered 2013-02-11: 21:00:00 via INTRAVENOUS

## 2013-02-11 MED ORDER — DARBEPOETIN ALFA-POLYSORBATE 150 MCG/0.3ML IJ SOLN
INTRAMUSCULAR | Status: AC
  Filled 2013-02-11: qty 0.3

## 2013-02-11 MED ORDER — DEXTROSE 5 % IV SOLN
1.0000 g | INTRAVENOUS | Status: DC
  Administered 2013-02-11 – 2013-02-13 (×3): 1 g via INTRAVENOUS
  Filled 2013-02-11 (×4): qty 10

## 2013-02-11 MED ORDER — EZETIMIBE 10 MG PO TABS
10.0000 mg | ORAL_TABLET | Freq: Every day | ORAL | Status: DC
  Administered 2013-02-11 – 2013-02-14 (×4): 10 mg via ORAL
  Filled 2013-02-11 (×4): qty 1

## 2013-02-11 MED ORDER — MIRTAZAPINE 15 MG PO TABS
15.0000 mg | ORAL_TABLET | Freq: Every day | ORAL | Status: DC
  Administered 2013-02-11 – 2013-02-13 (×3): 15 mg via ORAL
  Filled 2013-02-11 (×5): qty 1

## 2013-02-11 MED ORDER — ZOLPIDEM TARTRATE 5 MG PO TABS: 5.0000 mg | ORAL_TABLET | Freq: Every evening | ORAL | Status: DC | PRN

## 2013-02-11 MED ORDER — SENNA 8.6 MG PO TABS
1.0000 | ORAL_TABLET | Freq: Every day | ORAL | Status: DC | PRN
  Filled 2013-02-11: qty 1

## 2013-02-11 NOTE — ED Notes (Signed)
Onset today altered mental status lives at home with son who stated 1430 patient history of shaking however more than normal and family getting patient ready to go to short stay for lab work and "hgb shot" due to patient increased lethargy. Hospice care for renal failure and CHF. Ax4 now.

## 2013-02-11 NOTE — ED Provider Notes (Signed)
History     CSN: [id number]  Arrival date & time 02/11/13  1547   First MD Initiated Contact with Patient 02/11/13 1615      Chief Complaint  Patient presents with  . Altered Mental Status    (Consider location/radiation/quality/duration/timing/severity/associated sxs/prior treatment) HPI Comments: Patient presents from home with generalized weakness and "shaking episode" that is worse than normal. She was going to go to short stay to receive it sounds like an arinesp injection today. She was generally weak and sent to the ED. She is in hospice for history of COPD and CHF on home oxygen. Admits to poor by mouth intake at home with decreased appetite. No vomiting or fever. No chest pain, shortness of breath or abdominal pain. She is bed bound at baseline.  The history is provided by the patient and a relative.    Past Medical History  Diagnosis Date  . GERD (gastroesophageal reflux disease)   . Hypertension   . Morbid obesity   . Arthritis   . Chronic kidney disease     nephrolithiasis left kidney,s/p open resection  . Status post arthroscopic surgery of right knee   . Chronic back pain   . Pneumonia 07/2011    hx  . Pulmonary embolism 07/2011    hx  . Uterine cancer     endometrial high grade adenocarcinoma  . Uterus cancer, sarcoma     high grade invasive carcinosarcoma  . Myocardial infarction 1989  . Bronchitis   . H/O cardiomegaly   . Lymphedema 09/29/2011  . Diabetes mellitus, diet controlled 10/03/2011  . Chronic kidney disease, stage II (mild) 09/20/2011  . Acute diastolic congestive heart failure, 2D Oct 2012 09/29/2011  . Presence of IVC filter 01/08/2012  . History of colon polyps 01/08/2012  . Chest pain 01/16/2012  . Aortic stenosis, moderate 01/16/2012  . PAF (paroxysmal atrial fibrillation), 01/18/12 01/18/2012  . CAD (coronary artery disease) 03/14/2012  . Nephrolithiasis 03/14/2012  . Diverticulosis 03/14/2012  . Allergic rhinitis, cause unspecified 05/04/2012   . Gout 06/25/2012  . Radiation 02/03/2009    4500 cGy 25 fx iridium-192/uterine  . Sustained SVT, with need for emergent DCCV 07/24/12 07/24/2012  . SOB (shortness of breath) 07/24/2012  . Hypotensive episode, secondary to SVT with HR 200 07/24/2012  . Respiratory failure, acute, secondary to tachycardia, diastolic HF 07/24/2012  . Pericardial effusion 08/11/2012  . Hospice care patient     Past Surgical History  Procedure Laterality Date  . Colonoscopy      multiple  . Knee arthroscopy      right knee  . Cataract extraction, bilateral    . Tubal ligation  1973  . Abdominal hysterectomy  10/05/2008    total with b/l salpingo-oopherectomy  . Kidney stone removal    . Open left knee cartilage repair    . Left index finger surgery    . Cardiac catheterization      Family History  Problem Relation Age of Onset  . Breast cancer Sister     1st sister  . Colon cancer Sister     2nd sister  . Cervical cancer Sister     3rd sister    History  Substance Use Topics  . Smoking status: Never Smoker   . Smokeless tobacco: Never Used  . Alcohol Use: No    OB History   Grav Para Term Preterm Abortions TAB SAB Ect Mult Living   9 5  Review of Systems  Constitutional: Positive for activity change, appetite change and fatigue. Negative for fever.  HENT: Negative for congestion and rhinorrhea.   Respiratory: Negative for cough, chest tightness and shortness of breath.   Cardiovascular: Negative for chest pain.  Gastrointestinal: Negative for nausea, vomiting and abdominal pain.  Genitourinary: Negative for dysuria and hematuria.  Musculoskeletal: Positive for myalgias and arthralgias.  Skin: Negative for rash.  Neurological: Positive for weakness. Negative for light-headedness and headaches.  Psychiatric/Behavioral: Positive for altered mental status.  A complete 10 system review of systems was obtained and all systems are negative except as noted in the HPI and  PMH.    Allergies  Review of patient's allergies indicates no known allergies.  Home Medications   No current outpatient prescriptions on file.  BP 120/84  Pulse 125  Temp(Src) 98.9 F (37.2 C) (Oral)  Resp 17  Ht 5\' 6"  (1.676 m)  Wt 182 lb 8.7 oz (82.8 kg)  BMI 29.48 kg/m2  SpO2 100%  Physical Exam  Constitutional: She is oriented to person, place, and time. She appears well-developed and well-nourished. No distress.  Oriented x3  HENT:  Head: Normocephalic and atraumatic.  Mouth/Throat: Oropharynx is clear and moist. No oropharyngeal exudate.  Eyes: Conjunctivae and EOM are normal. Pupils are equal, round, and reactive to light. Left eye exhibits no discharge.  Neck: Normal range of motion. Neck supple.  Cardiovascular: Normal rate, regular rhythm and normal heart sounds.   No murmur heard. Pulmonary/Chest: Effort normal and breath sounds normal. No respiratory distress.  Bibasilar crackles  Abdominal: Soft. There is no tenderness. There is no rebound and no guarding.  Musculoskeletal: Normal range of motion. She exhibits no edema and no tenderness.  Neurological: She is alert and oriented to person, place, and time. No cranial nerve deficit. She exhibits normal muscle tone. Coordination normal.  Bilateral lower extremity weakness at baseline.  Skin: Skin is warm.    ED Course  Procedures (including critical care time)  Labs Reviewed  CBC WITH DIFFERENTIAL - Abnormal; Notable for the following:    RBC 3.59 (*)    Hemoglobin 11.6 (*)    HCT 33.2 (*)    All other components within normal limits  COMPREHENSIVE METABOLIC PANEL - Abnormal; Notable for the following:    Sodium 134 (*)    Glucose, Bld 169 (*)    BUN 97 (*)    Creatinine, Ser 4.98 (*)    Albumin 2.5 (*)    AST 240 (*)    ALT 185 (*)    Alkaline Phosphatase 118 (*)    GFR calc non Af Amer 7 (*)    GFR calc Af Amer 9 (*)    All other components within normal limits  URINALYSIS, ROUTINE W REFLEX  MICROSCOPIC - Abnormal; Notable for the following:    APPearance CLOUDY (*)    Hgb urine dipstick LARGE (*)    Bilirubin Urine SMALL (*)    Protein, ur 30 (*)    Leukocytes, UA LARGE (*)    All other components within normal limits  LACTIC ACID, PLASMA - Abnormal; Notable for the following:    Lactic Acid, Venous 3.0 (*)    All other components within normal limits  PRO B NATRIURETIC PEPTIDE - Abnormal; Notable for the following:    Pro B Natriuretic peptide (BNP) 10110.0 (*)    All other components within normal limits  URINE MICROSCOPIC-ADD ON - Abnormal; Notable for the following:    Bacteria, UA FEW (*)  All other components within normal limits  CBC - Abnormal; Notable for the following:    RBC 3.51 (*)    Hemoglobin 11.3 (*)    HCT 32.3 (*)    All other components within normal limits  CREATININE, SERUM - Abnormal; Notable for the following:    Creatinine, Ser 5.00 (*)    GFR calc non Af Amer 7 (*)    GFR calc Af Amer 9 (*)    All other components within normal limits  MRSA PCR SCREENING  URINE CULTURE  LIPASE, BLOOD  PROTIME-INR  POCT I-STAT TROPONIN I   Dg Chest 2 View  02/11/2013  *RADIOLOGY REPORT*  Clinical Data: Altered mental status.  CHEST - 2 VIEW  Comparison: 08/03/2012.  Findings: Patient is rotated to the left.  The exam is technically suboptimal.  Retrocardiac density is present probably representing atelectasis.  Pulmonary vascular congestion with probable interstitial and mild alveolar opacity at the bases.  The lateral view shows consolidation in the lower lobes which was also present on prior CT 08/09/2012. Probable cardiomegaly.  IMPRESSION:  1.  Technically suboptimal study with low volumes and rotation to the left. 2.  Retrocardiac consolidation may be chronic in this patient. Allowing for differences in technique, the consolidation appears similar to prior CT 08/09/2012.  Recurrent aspiration or pneumonia are in the differential considerations.   Original  Report Authenticated By: Andreas Newport, M.D.    Ct Head Wo Contrast  02/11/2013  *RADIOLOGY REPORT*  Clinical Data: Altered mental status.  Lethargy.  CT HEAD WITHOUT CONTRAST  Technique:  Contiguous axial images were obtained from the base of the skull through the vertex without contrast.  Comparison: None.  Findings: No mass lesion, mass effect, midline shift, hydrocephalus, hemorrhage.  No acute territorial cortical ischemia/infarct. Atrophy and chronic ischemic white matter disease is present.  Calvarium intact.  Mastoid air cells and paranasal sinuses clear.  Intracranial atherosclerosis.  IMPRESSION: Atrophy without acute intracranial abnormality.   Original Report Authenticated By: Andreas Newport, M.D.      1. Orthostatic hypotension   2. Aortic stenosis, moderate   3. Atrial fibrillation   4. Hypotension   5. Nausea and/or vomiting   6. Weakness generalized   7. Acute on chronic diastolic congestive heart failure   8. Chronic diastolic heart failure   9. CKD (chronic kidney disease) stage 4, GFR 15-29 ml/min       MDM  Generalized weakness that is worsening in hospice patient.  Borderline blood pressures, bradycardic, afebrile.  Alert and oriented.  At mental baseline per family. Denies pain.  +UTI, worsening of CKD. Son states in hospice for CKD and CHF. Last echo in October showed mild diastolic dysfunction only.  Patient and son confirm DNR/DNI.   Agreeable to admission for gentle IV hydration and antibiotics.   Date: 02/11/2013  Rate: 64  Rhythm: junctional  QRS Axis: normal  Intervals: normal  ST/T Wave abnormalities: nonspecific ST/T changes  Conduction Disutrbances:none  Narrative Interpretation:   Old EKG Reviewed: unchanged        Glynn Octave, MD 02/12/13 1219

## 2013-02-11 NOTE — ED Notes (Signed)
Patient son at bedside states patient is communicating to her normal.

## 2013-02-11 NOTE — ED Notes (Signed)
Nurse at bedside adjusting blood pressure cuff and patient states she was cold and witnessed tremors approx 5 seconds heart rate increase 120 monitor with artifact and returned hr 50.

## 2013-02-11 NOTE — H&P (Signed)
[facility] History and Physical  Christine [name] ZOX:[id number] DOB: 12-13-1930 DOA: 02/11/2013  Referring physician: ER physician. PCP: Oliver Barre, MD  Chief Complaint: Weakness.  HPI: Christine [name] is a 79 y.o. female history of atrial fibrillation, chronic kidney disease, pulmonary embolism status post IVC filter placement, moderate aortic stenosis who is on hospice care was brought to the ER because of weakness. Patient was originally taken to short stay for an Aranesp injection and from there patient was instructed to be taken to the ER. The patient was found to be hypotensive. As per patient's son who provided history patient has been feeling weak for last 2-3 days and has been having nausea and vomiting for last one day. She also has been having increased tremors. In the ER UA shows features of UTI and at this time patient's son has  suggested comfort measures only but okay with antibiotics and fluid. Patient will be admitted to palliative floor.  Review of Systems: As presented in the history of presenting illness, rest negative.  Past Medical History  Diagnosis Date  . GERD (gastroesophageal reflux disease)   . Hypertension   . Morbid obesity   . Arthritis   . Chronic kidney disease     nephrolithiasis left kidney,s/p open resection  . Status post arthroscopic surgery of right knee   . Chronic back pain   . Pneumonia 07/2011    hx  . Pulmonary embolism 07/2011    hx  . Uterine cancer     endometrial high grade adenocarcinoma  . Uterus cancer, sarcoma     high grade invasive carcinosarcoma  . Myocardial infarction 1989  . Bronchitis   . H/O cardiomegaly   . Lymphedema 09/29/2011  . Diabetes mellitus, diet controlled 10/03/2011  . Chronic kidney disease, stage II (mild) 09/20/2011  . Acute diastolic congestive heart failure, 2D Oct 2012 09/29/2011  . Presence of IVC filter 01/08/2012  . History of colon polyps 01/08/2012  . Chest pain 01/16/2012  . Aortic stenosis,  moderate 01/16/2012  . PAF (paroxysmal atrial fibrillation), 01/18/12 01/18/2012  . CAD (coronary artery disease) 03/14/2012  . Nephrolithiasis 03/14/2012  . Diverticulosis 03/14/2012  . Allergic rhinitis, cause unspecified 05/04/2012  . Gout 06/25/2012  . Radiation 02/03/2009    4500 cGy 25 fx iridium-192/uterine  . Sustained SVT, with need for emergent DCCV 07/24/12 07/24/2012  . SOB (shortness of breath) 07/24/2012  . Hypotensive episode, secondary to SVT with HR 200 07/24/2012  . Respiratory failure, acute, secondary to tachycardia, diastolic HF 07/24/2012  . Pericardial effusion 08/11/2012   Past Surgical History  Procedure Laterality Date  . Colonoscopy      multiple  . Knee arthroscopy      right knee  . Cataract extraction, bilateral    . Tubal ligation  1973  . Abdominal hysterectomy  10/05/2008    total with b/l salpingo-oopherectomy  . Kidney stone removal    . Open left knee cartilage repair    . Left index finger surgery    . Cardiac catheterization     Social History:  reports that she has never smoked. She has never used smokeless tobacco. She reports that she does not drink alcohol or use illicit drugs. Lives at home. where does patient live-- Cannot do ADLs. Can patient participate in ADLs?  No Known Allergies  Family History  Problem Relation Age of Onset  . Breast cancer Sister     1st sister  . Colon cancer Sister  2nd sister  . Cervical cancer Sister     3rd sister      Prior to Admission medications   Medication Sig Start Date End Date Taking? Authorizing Provider  acetaminophen (TYLENOL) 500 MG tablet Take 500 mg by mouth every 6 (six) hours as needed. For pain   Yes Historical Provider, MD  amiodarone (PACERONE) 400 MG tablet Take 1 tablet (400 mg total) by mouth 2 (two) times daily. 08/12/12  Yes Wilburt Finlay, PA-C  aspirin EC 81 MG tablet Take 81 mg by mouth daily.   Yes Historical Provider, MD  cholecalciferol (VITAMIN D) 1000 UNITS tablet Take  2,000 Units by mouth daily.    Yes Historical Provider, MD  darbepoetin (ARANESP) 100 MCG/0.5ML SOLN Inject 0.5 mLs (100 mcg total) into the skin every Saturday at 6 PM. 08/12/12  Yes Wilburt Finlay, PA-C  diclofenac sodium (VOLTAREN) 1 % GEL Apply 4 g topically 4 (four) times daily. As needed for pain 02/10/13  Yes Corwin Levins, MD  ezetimibe (ZETIA) 10 MG tablet Take 10 mg by mouth daily.    Yes Historical Provider, MD  furosemide (LASIX) 40 MG tablet Take 40 mg by mouth 2 (two) times daily.   Yes Historical Provider, MD  hydrocortisone (ANUSOL-HC) 25 MG suppository Place 25 mg rectally 2 (two) times daily.   Yes Historical Provider, MD  mirtazapine (REMERON) 15 MG tablet Take 1 tablet (15 mg total) by mouth at bedtime. 12/24/12  Yes Corwin Levins, MD  Nutritional Supplements (ENSURE PO) Take 1 each by mouth 2 (two) times daily as needed. For supplement   Yes Historical Provider, MD  omeprazole (PRILOSEC) 20 MG capsule Take 1 capsule (20 mg total) by mouth daily. 02/10/13  Yes Corwin Levins, MD  OVER THE COUNTER MEDICATION Apply 1 application topically daily.   Yes Historical Provider, MD  polyethylene glycol (MIRALAX / GLYCOLAX) packet Take 17 g by mouth 2 (two) times daily.    Yes Historical Provider, MD  senna (SENOKOT) 8.6 MG TABS Take 1 tablet by mouth.   Yes Historical Provider, MD  sulfamethoxazole-trimethoprim (BACTRIM DS,SEPTRA DS) 800-160 MG per tablet Take 1 tablet by mouth 2 (two) times daily. Started 02/06/13, for 7 days, ending 02/12/13.   Yes Historical Provider, MD  zolpidem (AMBIEN) 5 MG tablet Take 5-10 mg by mouth at bedtime as needed. For sleep.   Yes Historical Provider, MD  albuterol (PROVENTIL HFA;VENTOLIN HFA) 108 (90 BASE) MCG/ACT inhaler Inhale 2 puffs into the lungs every 6 (six) hours as needed. For shortness of breath    Historical Provider, MD  nitroGLYCERIN (NITROSTAT) 0.4 MG SL tablet Place 0.4 mg under the tongue every 5 (five) minutes as needed. For chest pain. Do not exceed 3  tablets. If pain is not resolved after 3rd tablet call 911.    Historical Provider, MD   Physical Exam: Filed Vitals:   02/11/13 1920 02/11/13 1945 02/11/13 1955 02/11/13 2031  BP:  92/43  111/68  Pulse: 51 48  51  Temp: 97.2 F (36.2 C)  97.2 F (36.2 C) 98 F (36.7 C)  TempSrc: Rectal  Rectal Oral  Resp: 13 13  16   Height:    5\' 6"  (1.676 m)  Weight:    82.8 kg (182 lb 8.7 oz)  SpO2: 100% 100%  100%     General:  Well developed and nourished.  Eyes: Anicteric no pallor.  ENT: No discharge from ears eyes nose or mouth.  Neck: No mass felt.  Cardiovascular: S1-S2 heard.  Respiratory: No rhonchi crepitations.  Abdomen: Soft nontender bowel sounds present.  Skin: No rash.  Musculoskeletal: No edema.  Psychiatric: Alert and oriented to name.  Neurologic: Follows commands.  Labs on Admission:  Basic Metabolic Panel:  Recent Labs Lab 02/11/13 1631  NA 134*  K 4.6  CL 97  CO2 22  GLUCOSE 169*  BUN 97*  CREATININE 4.98*  CALCIUM 8.9   Liver Function Tests:  Recent Labs Lab 02/11/13 1631  AST 240*  ALT 185*  ALKPHOS 118*  BILITOT 0.4  PROT 7.7  ALBUMIN 2.5*    Recent Labs Lab 02/11/13 1649  LIPASE 22   No results found for this basename: AMMONIA,  in the last 168 hours CBC:  Recent Labs Lab 02/11/13 1538 02/11/13 1631  WBC  --  9.0  NEUTROABS  --  6.4  HGB 10.7* 11.6*  HCT  --  33.2*  MCV  --  92.5  PLT  --  290   Cardiac Enzymes: No results found for this basename: CKTOTAL, CKMB, CKMBINDEX, TROPONINI,  in the last 168 hours  BNP (last 3 results)  Recent Labs  08/11/12 0654 08/12/12 0522 02/11/13 1844  PROBNP 2910.0* 2917.0* 10110.0*   CBG: No results found for this basename: GLUCAP,  in the last 168 hours  Radiological Exams on Admission: Dg Chest 2 View  02/11/2013  *RADIOLOGY REPORT*  Clinical Data: Altered mental status.  CHEST - 2 VIEW  Comparison: 08/03/2012.  Findings: Patient is rotated to the left.  The exam is  technically suboptimal.  Retrocardiac density is present probably representing atelectasis.  Pulmonary vascular congestion with probable interstitial and mild alveolar opacity at the bases.  The lateral view shows consolidation in the lower lobes which was also present on prior CT 08/09/2012. Probable cardiomegaly.  IMPRESSION:  1.  Technically suboptimal study with low volumes and rotation to the left. 2.  Retrocardiac consolidation may be chronic in this patient. Allowing for differences in technique, the consolidation appears similar to prior CT 08/09/2012.  Recurrent aspiration or pneumonia are in the differential considerations.   Original Report Authenticated By: Andreas Newport, M.D.    Ct Head Wo Contrast  02/11/2013  *RADIOLOGY REPORT*  Clinical Data: Altered mental status.  Lethargy.  CT HEAD WITHOUT CONTRAST  Technique:  Contiguous axial images were obtained from the base of the skull through the vertex without contrast.  Comparison: None.  Findings: No mass lesion, mass effect, midline shift, hydrocephalus, hemorrhage.  No acute territorial cortical ischemia/infarct. Atrophy and chronic ischemic white matter disease is present.  Calvarium intact.  Mastoid air cells and paranasal sinuses clear.  Intracranial atherosclerosis.  IMPRESSION: Atrophy without acute intracranial abnormality.   Original Report Authenticated By: Andreas Newport, M.D.      Assessment/Plan Principal Problem:   Weakness Active Problems:   CKD (chronic kidney disease) stage 4, GFR 15-29 ml/min   Aortic stenosis, moderate; Mean gradient of by cath   CAD (coronary artery disease) - Mild-moderate with ~30% RCA lesion by cath   Chronic diastolic heart failure grade 1, NL LVF echo Aug 2013   Hypotension   Atrial fibrillation   Nausea with vomiting   UTI (lower urinary tract infection)   1. Weakness, hypotension probably from dehydration secondary to nausea and vomiting. 2. Nausea and vomiting - causes not clear  probably from UTI. 3. UTI. 4. History of moderate aortic stenosis. 5. History of paroxysmal atrial fibrillation. 6. History of chronic kidney disease. 7. History  of PE status post IVC filter placement.  Plan: After discussing with patient's son at this time patient is made comfortable measures only but per son it is okay to continue with antibiotics and IV fluids. Patient will be continued on home medications but no further labs or any aggressive measures. Consult hospice in a.m.    Code Status: DO NOT RESUSCITATE.  Family Communication: Patient's son at the bedside.  Disposition Plan: Admit to inpatient.    Ziyon Soltau N. [facility] Pager [phone].  If 7PM-7AM, please contact night-coverage [email or web address] Password [email or web address] 02/11/2013, 9:00 PM

## 2013-02-12 ENCOUNTER — Encounter (HOSPITAL_COMMUNITY): Payer: Self-pay

## 2013-02-12 DIAGNOSIS — I951 Orthostatic hypotension: Secondary | ICD-10-CM

## 2013-02-12 DIAGNOSIS — I5033 Acute on chronic diastolic (congestive) heart failure: Secondary | ICD-10-CM

## 2013-02-12 DIAGNOSIS — I5032 Chronic diastolic (congestive) heart failure: Secondary | ICD-10-CM

## 2013-02-12 LAB — FERRITIN: Ferritin: 1897 ng/mL — ABNORMAL HIGH (ref 10–291)

## 2013-02-12 MED ORDER — AMIODARONE HCL 200 MG PO TABS
200.0000 mg | ORAL_TABLET | Freq: Two times a day (BID) | ORAL | Status: DC
  Administered 2013-02-12: 200 mg via ORAL
  Filled 2013-02-12 (×4): qty 1

## 2013-02-12 MED ORDER — SODIUM CHLORIDE 0.9 % IV SOLN
INTRAVENOUS | Status: DC
  Administered 2013-02-12 – 2013-02-13 (×5): via INTRAVENOUS

## 2013-02-12 NOTE — Progress Notes (Signed)
[facility] SW made visit to Hospital to see Patient, Caregiver Pieter Partridge, was present.  Patient talked about getting her Procrit shot and she stated she went out and the Nurses and her son were worried she had a stroke.  She stated plans to go home and SW will follow up at time of discharge.  Maretta Los, LCSW, ACHP-SW [facility] [phone]

## 2013-02-12 NOTE — Progress Notes (Signed)
[facility] PROGRESS NOTE  Christine [name] ZOX:[id number] DOB: [birth year] DOA: 02/11/2013 PCP: Christine Barre, MD  Assessment/Plan:  I have consulted hospice/ Palliative team.   1. UTI: Continue with Ceftriaxone. 2. Hypotension, sepsis: Continue with IV fluids, antibiotics. Family wants comfort measure.  3. Acute on Chronic renal failure stage V: progression renal failure vs acute on chronic renal failure secondary to UTI or dehydration.Continue with IV fluids. Patient is follow by hospice. Repeat renal function in am.  4. Aortic stenosis, Diastolic Heart Failure: monitor for pulmonary edema. Patient follow by hospice.  5. History of PE status post IVC filter placement. 6. Weakness: Multifactorial, secondary to hypotension, renal failure Heart failure, multiple co morbidities.    Code Status: DNR Family Communication: I spoke with son Christine [name] who is POA, he confirm DNR/DNI, no agressive measure. He agree with antibiotics and fluids. Ok with lab work.  Disposition Plan: to be determine.    Consultants:  Palliative  Procedures:  none  Antibiotics:  Ceftriaxone 4-30  HPI/Subjective: Feeling better than yesterday.   Objective: Filed Vitals:   02/11/13 1945 02/11/13 1955 02/11/13 2031 02/12/13 0426  BP: 92/43  111/68 85/51  Pulse: 48  51 52  Temp:  97.2 F (36.2 C) 98 F (36.7 C) 98 F (36.7 C)  TempSrc:  Rectal Oral Oral  Resp: 13  16 16   Height:   5\' 6"  (1.676 m)   Weight:   82.8 kg (182 lb 8.7 oz)   SpO2: 100%  100% 100%   No intake or output data in the 24 hours ending 02/12/13 0746 Filed Weights   02/11/13 2031  Weight: 82.8 kg (182 lb 8.7 oz)    Exam:   General:  No distress.  Cardiovascular: S 1, S 2 RRR  Respiratory: Crackles bases  Abdomen: BS present, soft, NT  Musculoskeletal: bilateral Lower extremities edema.   Data Reviewed: Basic Metabolic Panel:  Recent Labs Lab 02/11/13 1631 02/11/13 2108  NA 134*  --   K 4.6  --   CL  97  --   CO2 22  --   GLUCOSE 169*  --   BUN 97*  --   CREATININE 4.98* 5.00*  CALCIUM 8.9  --    Liver Function Tests:  Recent Labs Lab 02/11/13 1631  AST 240*  ALT 185*  ALKPHOS 118*  BILITOT 0.4  PROT 7.7  ALBUMIN 2.5*    Recent Labs Lab 02/11/13 1649  LIPASE 22   No results found for this basename: AMMONIA,  in the last 168 hours CBC:  Recent Labs Lab 02/11/13 1538 02/11/13 1631 02/11/13 2108  WBC  --  9.0 7.6  NEUTROABS  --  6.4  --   HGB 10.7* 11.6* 11.3*  HCT  --  33.2* 32.3*  MCV  --  92.5 92.0  PLT  --  290 281   BNP (last 3 results)  Recent Labs  08/11/12 0654 08/12/12 0522 02/11/13 1844  PROBNP 2910.0* 2917.0* 10110.0*   CBG: No results found for this basename: GLUCAP,  in the last 168 hours  Recent Results (from the past 240 hour(s))  MRSA PCR SCREENING     Status: None   Collection Time    02/11/13  8:50 PM      Result Value Range Status   MRSA by PCR NEGATIVE  NEGATIVE Final   Comment:            The GeneXpert MRSA Assay (FDA     approved for NASAL  specimens     only), is one component of a     comprehensive MRSA colonization     surveillance program. It is not     intended to diagnose MRSA     infection nor to guide or     monitor treatment for     MRSA infections.     Studies: Dg Chest 2 View  02/11/2013  *RADIOLOGY REPORT*  Clinical Data: Altered mental status.  CHEST - 2 VIEW  Comparison: 08/03/2012.  Findings: Patient is rotated to the left.  The exam is technically suboptimal.  Retrocardiac density is present probably representing atelectasis.  Pulmonary vascular congestion with probable interstitial and mild alveolar opacity at the bases.  The lateral view shows consolidation in the lower lobes which was also present on prior CT 08/09/2012. Probable cardiomegaly.  IMPRESSION:  1.  Technically suboptimal study with low volumes and rotation to the left. 2.  Retrocardiac consolidation may be chronic in this patient. Allowing for  differences in technique, the consolidation appears similar to prior CT 08/09/2012.  Recurrent aspiration or pneumonia are in the differential considerations.   Original Report Authenticated By: Christine [name], M.D.    Ct Head Wo Contrast  02/11/2013  *RADIOLOGY REPORT*  Clinical Data: Altered mental status.  Lethargy.  CT HEAD WITHOUT CONTRAST  Technique:  Contiguous axial images were obtained from the base of the skull through the vertex without contrast.  Comparison: None.  Findings: No mass lesion, mass effect, midline shift, hydrocephalus, hemorrhage.  No acute territorial cortical ischemia/infarct. Atrophy and chronic ischemic white matter disease is present.  Calvarium intact.  Mastoid air cells and paranasal sinuses clear.  Intracranial atherosclerosis.  IMPRESSION: Atrophy without acute intracranial abnormality.   Original Report Authenticated By: Christine [name], M.D.     Scheduled Meds: . sodium chloride   Intravenous STAT  . amiodarone  400 mg Oral BID  . aspirin EC  81 mg Oral Daily  . cefTRIAXone (ROCEPHIN)  IV  1 g Intravenous Q24H  . ezetimibe  10 mg Oral Daily  . heparin  5,000 Units Subcutaneous Q8H  . hydrocortisone  25 mg Rectal BID  . mirtazapine  15 mg Oral QHS  . polyethylene glycol  17 g Oral BID   Continuous Infusions: . sodium chloride 100 mL/hr at 02/12/13 1610    Principal Problem:   Weakness Active Problems:   CKD (chronic kidney disease) stage 4, GFR 15-29 ml/min   Aortic stenosis, moderate; Mean gradient of by cath   CAD (coronary artery disease) - Mild-moderate with ~30% RCA lesion by cath   Chronic diastolic heart failure grade 1, NL LVF echo Aug 2013   Hypotension   Atrial fibrillation   Nausea with vomiting   UTI (lower urinary tract infection)    Time spent: 25 minutes.    Christine [name]  [facility] Pager [phone]. If 7PM-7AM, please contact night-coverage at [email or web address], password [facility] 02/12/2013, 7:46 AM  LOS: 1 day

## 2013-02-12 NOTE — Progress Notes (Signed)
Utilization Review Completed Latrish Mogel J. Iliyana Convey, RN, BSN, NCM [phone]  

## 2013-02-12 NOTE — Progress Notes (Addendum)
Room 6732 - Charline Bills - [facility] RN Visit-R.Dezyre Hoefer RN  Related admission to [facility] diagnosis of heart disease.   Pt is DNR code.    Pt alert & oriented, lying upright  in bed, without complaints of pain or discomfort - dtr Pieter Partridge present and assisting pt in eating her breakfast of grits & scrambled eggs.  Patient's home medication list is on shadow chart.   Pt shares she was getting up following her routine shot of Procrit and "went out"  - son was with her.  She states after a while, she could respond to him.  Discussed with dtr and patient the safety issues of slowly rising to a sitting position and remaining in sitting position for several minutes prior to attempting to again slowly move to get up.     Copy of patient's goals on chart:  Wants to remain at home;  Wants to remain comfortable;  Wants to have quality of life for the remaining time she has.   Per D/W [facility] SW, and pt & dtr, pt plans on return to home at discharge.   Text page to Attending - Dr. Sunnie Nielsen to make her aware of goals.  No need for PMT GOC meeting.   Please call [facility] @ [phone]- ask for RN Liaison or after hours,ask for on-call RN with any hospice needs.   Thank you.  Joneen Boers, RN  [facility]  Hospice Liaison

## 2013-02-12 NOTE — Progress Notes (Signed)
Late entry:  Pt admitted to reg bed. Pt is AOX4 from home with son. Pt is currently bedridden, with increasing weakness and tremors. Pt has dry skin to BLE and dry heels. No other skin issues noted.  Pt oriented to unit staff, and safety measures.

## 2013-02-12 NOTE — Progress Notes (Signed)
Nutrition Brief Note  Patient identified on the Malnutrition Screening Tool (MST) Report. Chart reviewed. Pt now transitioning to comfort care.  No further nutrition interventions warranted at this time.  Please re-consult as needed.   Jarold Motto MS, RD, LDN Pager: [phone] After-hours pager: [phone]

## 2013-02-12 NOTE — Progress Notes (Signed)
Pt BP 85/51 Pt asymptomatic. MD notified orders received to increase IV fluids. Will continue to monitor.

## 2013-02-13 DIAGNOSIS — N184 Chronic kidney disease, stage 4 (severe): Secondary | ICD-10-CM

## 2013-02-13 DIAGNOSIS — N390 Urinary tract infection, site not specified: Secondary | ICD-10-CM

## 2013-02-13 LAB — RENAL FUNCTION PANEL
BUN: 94 mg/dL — ABNORMAL HIGH (ref 6–23)
CO2: 20 mEq/L (ref 19–32)
Calcium: 7.8 mg/dL — ABNORMAL LOW (ref 8.4–10.5)
Chloride: 100 mEq/L (ref 96–112)
Creatinine, Ser: 4.94 mg/dL — ABNORMAL HIGH (ref 0.50–1.10)
GFR calc non Af Amer: 8 mL/min — ABNORMAL LOW (ref >90)
Glucose, Bld: 80 mg/dL (ref 70–99)

## 2013-02-13 LAB — BASIC METABOLIC PANEL
BUN: 90 mg/dL — ABNORMAL HIGH (ref 6–23)
CO2: 20 mEq/L (ref 19–32)
Chloride: 102 mEq/L (ref 96–112)
Creatinine, Ser: 4.68 mg/dL — ABNORMAL HIGH (ref 0.50–1.10)
GFR calc Af Amer: 9 mL/min — ABNORMAL LOW (ref >90)
Potassium: 4.9 mEq/L (ref 3.5–5.1)

## 2013-02-13 MED ORDER — BIOTENE DRY MOUTH MT LIQD
15.0000 mL | Freq: Two times a day (BID) | OROMUCOSAL | Status: DC
  Administered 2013-02-14: 15 mL via OROMUCOSAL

## 2013-02-13 MED ORDER — AMIODARONE HCL 200 MG PO TABS
200.0000 mg | ORAL_TABLET | Freq: Every day | ORAL | Status: DC
  Administered 2013-02-14: 200 mg via ORAL
  Filled 2013-02-13: qty 1

## 2013-02-13 MED ORDER — SODIUM POLYSTYRENE SULFONATE 15 GM/60ML PO SUSP
30.0000 g | Freq: Once | ORAL | Status: AC
  Administered 2013-02-13: 30 g via ORAL
  Filled 2013-02-13: qty 120
  Filled 2013-02-13: qty 60

## 2013-02-13 NOTE — Evaluation (Signed)
Physical Therapy Evaluation Patient Details Name: Christine [name] MRN: [id number] DOB: [birth year] Today's Date: 02/13/2013 Time: 1308-6578 PT Time Calculation (min): 16 min  PT Assessment / Plan / Recommendation Clinical Impression  Patient is a 79 yo female admitted with weakness, hypotension.  She is currently being cared for by [facility], and has now been transitioned to comfort care only.  Patient required total assist with all mobility, and was pta.  Recommend patient transport home via ambulance.  No acute PT needs identified.  Will defer any f/u PT to [facility].  PT will sign off.    PT Assessment  Patent does not need any further PT services    Follow Up Recommendations  Supervision/Assistance - 24 hour;No PT follow up    Does the patient have the potential to tolerate intense rehabilitation      Barriers to Discharge        Equipment Recommendations  None recommended by PT    Recommendations for Other Services     Frequency      Precautions / Restrictions Precautions Precautions: Fall Restrictions Weight Bearing Restrictions: No   Pertinent Vitals/Pain       Mobility  Bed Mobility Bed Mobility: Rolling Left Rolling Left: 2: Max assist Details for Bed Mobility Assistance: Verbal cues for technique.  Patient can initiate movement using UE's on rail.  Requires max assist to complete roll of trunk, hips, and LE's. Transfers Transfers: Not assessed      PT Goals  N/A  Visit Information  Last PT Received On: 02/13/13 Assistance Needed: +1    Subjective Data  Subjective: "I stay in my bed at home now." Patient Stated Goal: To return home.   Prior Functioning  Home Living Lives With: Son Available Help at Discharge: Family;Available 24 hours/day ([facility]) Type of Home: House Home Access: Level entry Home Layout: One level Home Adaptive Equipment: Bedside commode/3-in-1;Hospital bed;Walker - rolling;Wheelchair - manual (Uses bedpan) Prior Function Level of  Independence: Needs assistance Needs Assistance: Bathing;Dressing;Toileting;Light Housekeeping;Meal Prep;Transfers Bath: Total (Sponge baths) Dressing: Maximal Toileting: Total (using bedpan) Meal Prep: Total Light Housekeeping: Total Transfer Assistance: Max assist to move to w/c. Able to Take Stairs?: No Driving: No Communication Communication: No difficulties    Cognition  Cognition Arousal/Alertness: Awake/alert Behavior During Therapy: WFL for tasks assessed/performed Overall Cognitive Status: Within Functional Limits for tasks assessed    Extremity/Trunk Assessment Right Upper Extremity Assessment RUE ROM/Strength/Tone: Deficits RUE ROM/Strength/Tone Deficits: Strength 4-/5 Left Upper Extremity Assessment LUE ROM/Strength/Tone: Deficits LUE ROM/Strength/Tone Deficits: Strength 4-/5 Right Lower Extremity Assessment RLE ROM/Strength/Tone: Deficits RLE ROM/Strength/Tone Deficits: Strength 2/5; Edema noted Left Lower Extremity Assessment LLE ROM/Strength/Tone: Deficits LLE ROM/Strength/Tone Deficits: Strength 2/5; Edema noted   Balance    End of Session PT - End of Session Activity Tolerance: Patient limited by fatigue;Patient limited by pain Patient left: in bed;with call bell/phone within reach (left patient sidelying in bed)  GP     Christine [name] 02/13/2013, 3:04 PM  Durenda Hurt. Renaldo Fiddler, [facility] Acute Rehab Services Pager [phone]

## 2013-02-13 NOTE — Progress Notes (Signed)
[id number] Charline Bills - [facility] RN Visit-R.Jonnette Nuon RN  Related admission to [facility] diagnosis of heart disease.  Pt is DNR code.    Pt alert & oriented, sitting up in bed, eating breakfast, with complaints of hemorrhoidal pain. Staff RN present in room and will assess - pt on Anusol at home.  Pt could not remember when LBM occurred.   No family present. Patient's home medication list is on shadow chart.   Please call [facility] @ [phone]- ask for RN Liaison or after hours,ask for on-call RN with any hospice needs.   Thank you.  Joneen Boers, RN  [facility]  Hospice Liaison

## 2013-02-13 NOTE — Progress Notes (Signed)
[facility] PROGRESS NOTE  Christine [name]:[id number] DOB: [birth year] DOA: 02/11/2013 PCP: Oliver Barre, MD  Assessment/Plan:  I have consulted hospice/ Palliative team.   1. UTI: Continue with Ceftriaxone day 2. Follow urine culture . 2. Hypotension, sepsis: Continue with IV fluids, antibiotics. Family wants comfort measure. BP stable.  3. Acute on Chronic renal failure stage V: progression renal failure vs acute on chronic renal failure secondary to UTI or dehydration.Continue with IV fluids. Patient is follow by hospice. Repeat renal function in am. Will give kayexalate for hyperkalemia. Cr decrease to 4 from 5. Strict I and O.  4. Aortic stenosis, Diastolic Heart Failure: monitor for pulmonary edema. Patient follow by hospice.  5. History of PE status post IVC filter placement. 6. Weakness: Multifactorial, secondary to hypotension, renal failure Heart failure, multiple co morbidities.    Code Status: DNR Family Communication: I spoke with son Wladyslawa Disbro who is POA, he confirm DNR/DNI, no agressive measure. He agree with antibiotics and fluids. Ok with lab work.  Disposition Plan: home probably 5-3.    Consultants:  Palliative  Procedures:  none  Antibiotics:  Ceftriaxone 4-30  HPI/Subjective: Feeling better than yesterday. Feeling at baseline. No SOB.   Objective: Filed Vitals:   02/12/13 1752 02/12/13 2031 02/12/13 2158 02/13/13 0502  BP: 110/46 106/38 114/42 106/44  Pulse: 61 47 49 79  Temp: 98 F (36.7 C) 98.1 F (36.7 C)  98.2 F (36.8 C)  TempSrc: Oral Oral  Oral  Resp: 17 16  16   Height:      Weight:  85.276 kg (188 lb)    SpO2: 100% 100% 100% 98%    Intake/Output Summary (Last 24 hours) at 02/13/13 0824 Last data filed at 02/13/13 0629  Gross per 24 hour  Intake 3048.33 ml  Output      0 ml  Net 3048.33 ml   Filed Weights   02/11/13 2031 02/12/13 2031  Weight: 82.8 kg (182 lb 8.7 oz) 85.276 kg (188 lb)    Exam:   General:  No  distress.  Cardiovascular: S 1, S 2 RRR systolic murmur.   Respiratory: Crackles bases  Abdomen: BS present, soft, NT  Musculoskeletal: bilateral Lower extremities edema.   Data Reviewed: Basic Metabolic Panel:  Recent Labs Lab 02/11/13 1631 02/11/13 2108 02/13/13 0540  NA 134*  --  131*  K 4.6  --  5.9*  CL 97  --  100  CO2 22  --  20  GLUCOSE 169*  --  80  BUN 97*  --  94*  CREATININE 4.98* 5.00* 4.94*  CALCIUM 8.9  --  7.8*  PHOS  --   --  4.9*   Liver Function Tests:  Recent Labs Lab 02/11/13 1631 02/13/13 0540  AST 240*  --   ALT 185*  --   ALKPHOS 118*  --   BILITOT 0.4  --   PROT 7.7  --   ALBUMIN 2.5* 1.9*    Recent Labs Lab 02/11/13 1649  LIPASE 22   No results found for this basename: AMMONIA,  in the last 168 hours CBC:  Recent Labs Lab 02/11/13 1538 02/11/13 1631 02/11/13 2108  WBC  --  9.0 7.6  NEUTROABS  --  6.4  --   HGB 10.7* 11.6* 11.3*  HCT  --  33.2* 32.3*  MCV  --  92.5 92.0  PLT  --  290 281   BNP (last 3 results)  Recent Labs  08/11/12 0654 08/12/12 0522  02/11/13 1844  PROBNP 2910.0* 2917.0* 10110.0*   CBG: No results found for this basename: GLUCAP,  in the last 168 hours  Recent Results (from the past 240 hour(s))  MRSA PCR SCREENING     Status: None   Collection Time    02/11/13  8:50 PM      Result Value Range Status   MRSA by PCR NEGATIVE  NEGATIVE Final   Comment:            The GeneXpert MRSA Assay (FDA     approved for NASAL specimens     only), is one component of a     comprehensive MRSA colonization     surveillance program. It is not     intended to diagnose MRSA     infection nor to guide or     monitor treatment for     MRSA infections.     Studies: Dg Chest 2 View  02/11/2013  *RADIOLOGY REPORT*  Clinical Data: Altered mental status.  CHEST - 2 VIEW  Comparison: 08/03/2012.  Findings: Patient is rotated to the left.  The exam is technically suboptimal.  Retrocardiac density is present  probably representing atelectasis.  Pulmonary vascular congestion with probable interstitial and mild alveolar opacity at the bases.  The lateral view shows consolidation in the lower lobes which was also present on prior CT 08/09/2012. Probable cardiomegaly.  IMPRESSION:  1.  Technically suboptimal study with low volumes and rotation to the left. 2.  Retrocardiac consolidation may be chronic in this patient. Allowing for differences in technique, the consolidation appears similar to prior CT 08/09/2012.  Recurrent aspiration or pneumonia are in the differential considerations.   Original Report Authenticated By: Andreas Newport, M.D.    Ct Head Wo Contrast  02/11/2013  *RADIOLOGY REPORT*  Clinical Data: Altered mental status.  Lethargy.  CT HEAD WITHOUT CONTRAST  Technique:  Contiguous axial images were obtained from the base of the skull through the vertex without contrast.  Comparison: None.  Findings: No mass lesion, mass effect, midline shift, hydrocephalus, hemorrhage.  No acute territorial cortical ischemia/infarct. Atrophy and chronic ischemic white matter disease is present.  Calvarium intact.  Mastoid air cells and paranasal sinuses clear.  Intracranial atherosclerosis.  IMPRESSION: Atrophy without acute intracranial abnormality.   Original Report Authenticated By: Andreas Newport, M.D.     Scheduled Meds: . amiodarone  200 mg Oral BID  . aspirin EC  81 mg Oral Daily  . cefTRIAXone (ROCEPHIN)  IV  1 g Intravenous Q24H  . ezetimibe  10 mg Oral Daily  . heparin  5,000 Units Subcutaneous Q8H  . hydrocortisone  25 mg Rectal BID  . mirtazapine  15 mg Oral QHS  . polyethylene glycol  17 g Oral BID  . sodium polystyrene  30 g Oral Once   Continuous Infusions: . sodium chloride 100 mL/hr at 02/13/13 0559    Principal Problem:   Weakness Active Problems:   CKD (chronic kidney disease) stage 4, GFR 15-29 ml/min   Aortic stenosis, moderate; Mean gradient of by cath   CAD (coronary artery  disease) - Mild-moderate with ~30% RCA lesion by cath   Chronic diastolic heart failure grade 1, NL LVF echo Aug 2013   Hypotension   Atrial fibrillation   Nausea with vomiting   UTI (lower urinary tract infection)    Time spent: 25 minutes.    September Mormile  [facility] Pager [phone]. If 7PM-7AM, please contact night-coverage at [email or web address], password [facility] 02/13/2013, 8:24  AM  LOS: 2 days

## 2013-02-13 NOTE — Progress Notes (Signed)
02/13/2013 patient heart rate at 1000 today was 55, Dr Sunnie Nielsen was notified and Amiodarone was not given this morning.[facility] RN.

## 2013-02-14 DIAGNOSIS — I509 Heart failure, unspecified: Secondary | ICD-10-CM

## 2013-02-14 LAB — BASIC METABOLIC PANEL
CO2: 25 mEq/L (ref 19–32)
Chloride: 104 mEq/L (ref 96–112)
Creatinine, Ser: 4.72 mg/dL — ABNORMAL HIGH (ref 0.50–1.10)
Glucose, Bld: 78 mg/dL (ref 70–99)

## 2013-02-14 LAB — URINE CULTURE

## 2013-02-14 MED ORDER — AMIODARONE HCL 200 MG PO TABS: 200.0000 mg | ORAL_TABLET | Freq: Every day | ORAL | Status: DC

## 2013-02-14 MED ORDER — CEPHALEXIN 250 MG PO CAPS: 250.0000 mg | ORAL_CAPSULE | Freq: Two times a day (BID) | ORAL | Status: AC

## 2013-02-14 NOTE — Progress Notes (Signed)
02/14/13 1147 nsg Pt is going home via ambulance;son called that pt is going home. Pt is DNR papers are with ambulance.

## 2013-02-14 NOTE — Care Management (Signed)
   CARE MANAGEMENT NOTE 02/14/2013  Patient:  Christine [name], Christine [name]   Account Number:  [id number]  Date Initiated:  02/14/2013  Documentation initiated by:  Magnus Ivan  Subjective/Objective Assessment:   79 year old female. Admitted with UTI. PTA home with hospice.     Action/Plan:   Home with hospice.   Anticipated DC Date:  02/14/2013   Anticipated DC Plan:  HOME W HOSPICE CARE  In-house referral  Clinical Social Worker      DC Planning Services  CM consult      [facility] Choice  HOSPICE   Choice offered to / List presented to:  NA           Status of service:  Completed, signed off Medicare Important Message given?   (If response is "NO", the following Medicare IM given date fields will be blank) Date Medicare IM given:   Date Additional Medicare IM given:    Discharge Disposition:  HOME W HOSPICE CARE  Per UR Regulation:    If discussed at Long Length of Stay Meetings, dates discussed:    Comments:  02/14/13 9:41am  Magnus Ivan, RN, BSN, CCM [phone] CM COnsult for patient d/c plan home with hospice. [facility] notified of discharge. D/C summary faxed.

## 2013-02-14 NOTE — Discharge Summary (Signed)
Physician Discharge Summary  Christine [name] UEA:[id number] DOB: 1931-09-22 DOA: 02/11/2013  PCP: Oliver Barre, MD  Admit date: 02/11/2013 Discharge date: 02/14/2013  Time spent: 35 minutes  Recommendations for Outpatient Follow-up:  1. Need a bmet in 48 hour to follow K level and determine need for kayexalate.   Discharge Diagnoses:   UTI (lower urinary tract infection)   Weakness multifactorial, multiple co morbidities.    Acute on chronic CKD (chronic kidney disease) stage 4, GFR 15-29 ml/min   Aortic stenosis, moderate; Mean gradient of by cath   CAD (coronary artery disease) - Mild-moderate with ~30% RCA lesion by cath   Chronic diastolic heart failure grade 1, NL LVF echo Aug 2013   Hypotension   Atrial fibrillation   Nausea with vomiting     Discharge Condition: stable  Diet recommendation: Renal diet  Filed Weights   02/11/13 2031 02/12/13 2031 02/13/13 1948  Weight: 82.8 kg (182 lb 8.7 oz) 85.276 kg (188 lb) 86.9 kg (191 lb 9.3 oz)    History of present illness:  Christine [name] is a 79 y.o. female history of atrial fibrillation, chronic kidney disease, pulmonary embolism status post IVC filter placement, moderate aortic stenosis who is on hospice care was brought to the ER because of weakness. Patient was originally taken to short stay for an Aranesp injection and from there patient was instructed to be taken to the ER. The patient was found to be hypotensive. As per patient's son who provided history patient has been feeling weak for last 2-3 days and has been having nausea and vomiting for last one day. She also has been having increased tremors. In the ER UA shows features of UTI and at this time patient's son has suggested comfort measures only but okay with antibiotics and fluid. Patient will be admitted to palliative floor.   Hospital Course:  79 year old with past medical history significant for CKD, Moderate aortic stenosis, on hospice care, admitted with  weakness. Patient was found to be hypotensive, worsening renal failure. She was treated for urinary tract infection with ceftriaxone for IV for 3 days. Patient is under hospice care, she is DNR/DNI. Son who is power of attorney agree with IV fluid and IV antibiotics. Patient hypotension resolved with IV fluids. Her kidney function has stabilized. She probably has a combination of acute on chronic renal failure and probably progression of renal failure. She received Kayexalate for hyperkalemia. Lasix  was on hold during this hospitalization. She will need to follow up with her primary nephrologist to consider resume low-dose Lasix.  A B-met  is pending at this time if potassium level is within normal limits she will be discharged today. She also has history of A. fib and is on amiodarone. Amiodarone was discontinued due to bradycardia. She will need to followup with PCP to consider resume amiodarone.  1. UTI: Continue with Ceftriaxone day 3. Follow urine culture . Discharge on keflex for 4 more days.  2. Hypotension, sepsis:  Resolved  with IV fluids, antibiotics. Family wants comfort measure. BP stable.  3. Acute on Chronic renal failure stage V: progression renal failure vs acute on chronic renal failure secondary to UTI or dehydration.Continue with IV fluids. Patient is follow by hospice. Recieved kayexalate for hyperkalemia. Cr decrease to 4 from 5. Continue to hold lasix at discharge. 4. Aortic stenosis, Diastolic Heart Failure: monitor for pulmonary edema. Patient follow by hospice.  5. History of PE status post IVC filter placement. 6. Weakness:  Multifactorial, secondary to hypotension, renal failure Heart failure, multiple co morbidities.  7.   Procedures:  none  Consultations:  Palliative  Discharge Exam: Filed Vitals:   02/13/13 1400 02/13/13 1800 02/13/13 1948 02/14/13 0528  BP: 114/52 116/48 125/42 140/49  Pulse: 60 50 47 57  Temp: 98.6 F (37 C) 98.3 F (36.8 C) 97.8 F (36.6  C) 97.9 F (36.6 C)  TempSrc: Oral Oral Oral Oral  Resp: 18 16 18 20   Height:      Weight:   86.9 kg (191 lb 9.3 oz)   SpO2: 96% 98% 98% 100%    General: no distress Cardiovascular: S1, S 2 RRR Respiratory: CTA  Discharge Instructions  Discharge Orders   Future Appointments Provider Department Dept Phone   07/20/2013 1:00 PM Billie Lade, MD [facility] [phone]   Future Orders Complete By Expires     Increase activity slowly  As directed         Medication List    STOP taking these medications       amiodarone 200 MG tablet  Commonly known as:  PACERONE     furosemide 40 MG tablet  Commonly known as:  LASIX     sulfamethoxazole-trimethoprim 800-160 MG per tablet  Commonly known as:  BACTRIM DS,SEPTRA DS      TAKE these medications       acetaminophen 500 MG tablet  Commonly known as:  TYLENOL  Take 500 mg by mouth every 6 (six) hours as needed. For pain     albuterol 108 (90 BASE) MCG/ACT inhaler  Commonly known as:  PROVENTIL HFA;VENTOLIN HFA  Inhale 2 puffs into the lungs every 6 (six) hours as needed. For shortness of breath     aspirin EC 81 MG tablet  Take 81 mg by mouth daily.     cephALEXin 250 MG capsule  Commonly known as:  KEFLEX  Take 1 capsule (250 mg total) by mouth 2 (two) times daily.     cholecalciferol 1000 UNITS tablet  Commonly known as:  VITAMIN D  Take 2,000 Units by mouth daily.     darbepoetin 100 MCG/0.5ML Soln  Commonly known as:  ARANESP  Inject 0.5 mLs (100 mcg total) into the skin every Saturday at 6 PM.     diclofenac sodium 1 % Gel  Commonly known as:  VOLTAREN  Apply 4 g topically 4 (four) times daily. As needed for pain     ENSURE PO  Take 1 each by mouth 2 (two) times daily as needed. For supplement     ezetimibe 10 MG tablet  Commonly known as:  ZETIA  Take 10 mg by mouth daily.     hydrocortisone 25 MG suppository  Commonly known as:  ANUSOL-HC  Place 25 mg rectally 2  (two) times daily.     mirtazapine 15 MG tablet  Commonly known as:  REMERON  Take 1 tablet (15 mg total) by mouth at bedtime.     nitroGLYCERIN 0.4 MG SL tablet  Commonly known as:  NITROSTAT  Place 0.4 mg under the tongue every 5 (five) minutes as needed. For chest pain. Do not exceed 3 tablets. If pain is not resolved after 3rd tablet call 911.     omeprazole 20 MG capsule  Commonly known as:  PRILOSEC  Take 1 capsule (20 mg total) by mouth daily.     OVER THE COUNTER MEDICATION  Apply 1 application topically daily.  polyethylene glycol packet  Commonly known as:  MIRALAX / GLYCOLAX  Take 17 g by mouth 2 (two) times daily.     senna 8.6 MG Tabs  Commonly known as:  SENOKOT  Take 1 tablet by mouth.     zolpidem 5 MG tablet  Commonly known as:  AMBIEN  Take 5-10 mg by mouth at bedtime as needed. For sleep.       No Known Allergies     Follow-up Information   Follow up with Oliver Barre, MD. (needs to have lab drawn and call kidney Dr. with results. )    Contact information:   [address] [place] [address] [phone]        The results of significant diagnostics from this hospitalization (including imaging, microbiology, ancillary and laboratory) are listed below for reference.    Significant Diagnostic Studies: Dg Chest 2 View  02/11/2013  *RADIOLOGY REPORT*  Clinical Data: Altered mental status.  CHEST - 2 VIEW  Comparison: 08/03/2012.  Findings: Patient is rotated to the left.  The exam is technically suboptimal.  Retrocardiac density is present probably representing atelectasis.  Pulmonary vascular congestion with probable interstitial and mild alveolar opacity at the bases.  The lateral view shows consolidation in the lower lobes which was also present on prior CT 08/09/2012. Probable cardiomegaly.  IMPRESSION:  1.  Technically suboptimal study with low volumes and rotation to the left. 2.  Retrocardiac consolidation may be chronic in this  patient. Allowing for differences in technique, the consolidation appears similar to prior CT 08/09/2012.  Recurrent aspiration or pneumonia are in the differential considerations.   Original Report Authenticated By: Andreas Newport, M.D.    Ct Head Wo Contrast  02/11/2013  *RADIOLOGY REPORT*  Clinical Data: Altered mental status.  Lethargy.  CT HEAD WITHOUT CONTRAST  Technique:  Contiguous axial images were obtained from the base of the skull through the vertex without contrast.  Comparison: None.  Findings: No mass lesion, mass effect, midline shift, hydrocephalus, hemorrhage.  No acute territorial cortical ischemia/infarct. Atrophy and chronic ischemic white matter disease is present.  Calvarium intact.  Mastoid air cells and paranasal sinuses clear.  Intracranial atherosclerosis.  IMPRESSION: Atrophy without acute intracranial abnormality.   Original Report Authenticated By: Andreas Newport, M.D.     Microbiology: Recent Results (from the past 240 hour(s))  URINE CULTURE     Status: None   Collection Time    02/11/13  7:34 PM      Result Value Range Status   Specimen Description URINE, CATHETERIZED   Final   Special Requests NONE   Final   Culture  Setup Time 02/12/2013 08:08   Final   Colony Count >=100,000 COLONIES/ML   Final   Culture GRAM NEGATIVE RODS   Final   Report Status PENDING   Incomplete  MRSA PCR SCREENING     Status: None   Collection Time    02/11/13  8:50 PM      Result Value Range Status   MRSA by PCR NEGATIVE  NEGATIVE Final   Comment:            The GeneXpert MRSA Assay (FDA     approved for NASAL specimens     only), is one component of a     comprehensive MRSA colonization     surveillance program. It is not     intended to diagnose MRSA     infection nor to guide or  monitor treatment for     MRSA infections.     Labs: Basic Metabolic Panel:  Recent Labs Lab 02/11/13 1631 02/11/13 2108 02/13/13 0540 02/13/13 1550  NA 134*  --  131* 136  K 4.6   --  5.9* 4.9  CL 97  --  100 102  CO2 22  --  20 20  GLUCOSE 169*  --  80 89  BUN 97*  --  94* 90*  CREATININE 4.98* 5.00* 4.94* 4.68*  CALCIUM 8.9  --  7.8* 8.5  PHOS  --   --  4.9*  --    Liver Function Tests:  Recent Labs Lab 02/11/13 1631 02/13/13 0540  AST 240*  --   ALT 185*  --   ALKPHOS 118*  --   BILITOT 0.4  --   PROT 7.7  --   ALBUMIN 2.5* 1.9*    Recent Labs Lab 02/11/13 1649  LIPASE 22   No results found for this basename: AMMONIA,  in the last 168 hours CBC:  Recent Labs Lab 02/11/13 1538 02/11/13 1631 02/11/13 2108  WBC  --  9.0 7.6  NEUTROABS  --  6.4  --   HGB 10.7* 11.6* 11.3*  HCT  --  33.2* 32.3*  MCV  --  92.5 92.0  PLT  --  290 281   Cardiac Enzymes: No results found for this basename: CKTOTAL, CKMB, CKMBINDEX, TROPONINI,  in the last 168 hours BNP: BNP (last 3 results)  Recent Labs  08/11/12 0654 08/12/12 0522 02/11/13 1844  PROBNP 2910.0* 2917.0* 10110.0*   CBG: No results found for this basename: GLUCAP,  in the last 168 hours     Signed:  REGALADO,BELKYS  [facility] 02/14/2013, 7:35 AM

## 2013-05-02 ENCOUNTER — Other Ambulatory Visit: Payer: Self-pay | Admitting: Internal Medicine

## 2013-07-17 ENCOUNTER — Telehealth: Payer: Self-pay | Admitting: Oncology

## 2013-07-17 NOTE — Telephone Encounter (Signed)
Talked to [facility], daughter and asked if she will be coming to her follow up on Monday.  Her daughter said that she has hospice and is not able to get up out of bed.  She said she would not be coming.  Will notify Dr. Roselind Messier.

## 2013-07-20 ENCOUNTER — Ambulatory Visit: Payer: Medicare Other | Admitting: Radiation Oncology

## 2013-08-20 ENCOUNTER — Other Ambulatory Visit: Payer: Self-pay

## 2013-08-24 ENCOUNTER — Telehealth: Payer: Self-pay | Admitting: *Deleted

## 2013-08-24 MED ORDER — ZOLPIDEM TARTRATE 5 MG PO TABS: 5.0000 mg | ORAL_TABLET | Freq: Every evening | ORAL | Status: AC | PRN

## 2013-08-24 NOTE — Telephone Encounter (Signed)
Called requesting Ambien refill.  Please advise

## 2013-08-24 NOTE — Telephone Encounter (Signed)
Done hardcopy to robin  

## 2013-08-25 NOTE — Telephone Encounter (Signed)
Faxed hardcopy to [facility] [address] [place] 

## 2014-02-23 ENCOUNTER — Telehealth: Payer: Self-pay

## 2014-02-23 NOTE — Telephone Encounter (Signed)
Hospice is requesting new orders for this patient.  Is it ok for PCP to be attending/symptom management.  Call back [place] with Hospice [phone]

## 2014-02-23 NOTE — Telephone Encounter (Signed)
Hospice informed of MD ok verbal.

## 2014-02-23 NOTE — Telephone Encounter (Signed)
I would prefer the hospice MD if possible

## 2014-07-30 ENCOUNTER — Other Ambulatory Visit: Payer: Self-pay

## 2014-08-16 ENCOUNTER — Encounter (HOSPITAL_COMMUNITY): Payer: Self-pay

## 2014-09-23 ENCOUNTER — Encounter (HOSPITAL_COMMUNITY): Payer: Self-pay | Admitting: Internal Medicine

## 2015-04-11 ENCOUNTER — Other Ambulatory Visit: Payer: Self-pay

## 2015-10-16 DEATH — deceased
